# Patient Record
Sex: Female | Born: 1940
Health system: Southern US, Community
[De-identification: ages and names within clinical notes are randomized; demographics above are authoritative.]

## PROBLEM LIST (undated history)

## (undated) DIAGNOSIS — K219 Gastro-esophageal reflux disease without esophagitis: Secondary | ICD-10-CM

## (undated) DIAGNOSIS — E559 Vitamin D deficiency, unspecified: Secondary | ICD-10-CM

## (undated) DIAGNOSIS — Z9889 Other specified postprocedural states: Secondary | ICD-10-CM

## (undated) DIAGNOSIS — E785 Hyperlipidemia, unspecified: Secondary | ICD-10-CM

## (undated) DIAGNOSIS — M1990 Unspecified osteoarthritis, unspecified site: Secondary | ICD-10-CM

## (undated) DIAGNOSIS — R319 Hematuria, unspecified: Secondary | ICD-10-CM

## (undated) DIAGNOSIS — Z8719 Personal history of other diseases of the digestive system: Secondary | ICD-10-CM

## (undated) DIAGNOSIS — C9590 Leukemia, unspecified not having achieved remission: Secondary | ICD-10-CM

## (undated) DIAGNOSIS — R112 Nausea with vomiting, unspecified: Secondary | ICD-10-CM

## (undated) HISTORY — PX: BREAST SURGERY: SHX581

## (undated) HISTORY — PX: ABDOMINAL HYSTERECTOMY: SHX81

## (undated) HISTORY — PX: HIP SURGERY: SHX245

## (undated) HISTORY — PX: OTHER SURGICAL HISTORY: SHX169

## (undated) HISTORY — PX: YAG LASER APPLICATION: SHX6189

---

## 1998-03-15 ENCOUNTER — Other Ambulatory Visit: Admission: RE | Admit: 1998-03-15 | Discharge: 1998-03-15 | Payer: Self-pay | Admitting: Rheumatology

## 1999-05-11 ENCOUNTER — Encounter: Payer: Self-pay | Admitting: Endocrinology

## 1999-05-11 ENCOUNTER — Ambulatory Visit (HOSPITAL_COMMUNITY): Admission: RE | Admit: 1999-05-11 | Discharge: 1999-05-11 | Payer: Self-pay | Admitting: Endocrinology

## 1999-08-26 ENCOUNTER — Encounter: Payer: Self-pay | Admitting: Endocrinology

## 1999-08-26 ENCOUNTER — Ambulatory Visit (HOSPITAL_COMMUNITY): Admission: RE | Admit: 1999-08-26 | Discharge: 1999-08-26 | Payer: Self-pay | Admitting: Endocrinology

## 2001-06-21 ENCOUNTER — Ambulatory Visit (HOSPITAL_COMMUNITY): Admission: RE | Admit: 2001-06-21 | Discharge: 2001-06-21 | Payer: Self-pay | Admitting: Internal Medicine

## 2001-06-21 ENCOUNTER — Encounter: Payer: Self-pay | Admitting: Internal Medicine

## 2001-06-25 ENCOUNTER — Other Ambulatory Visit: Admission: RE | Admit: 2001-06-25 | Discharge: 2001-06-25 | Payer: Self-pay | Admitting: Rheumatology

## 2004-06-20 ENCOUNTER — Encounter: Admission: RE | Admit: 2004-06-20 | Discharge: 2004-06-20 | Payer: Self-pay | Admitting: Orthopedic Surgery

## 2004-10-01 ENCOUNTER — Encounter: Admission: RE | Admit: 2004-10-01 | Discharge: 2004-10-01 | Payer: Self-pay | Admitting: Orthopedic Surgery

## 2004-10-06 ENCOUNTER — Encounter: Admission: RE | Admit: 2004-10-06 | Discharge: 2004-10-06 | Payer: Self-pay | Admitting: Orthopedic Surgery

## 2004-11-21 ENCOUNTER — Ambulatory Visit (HOSPITAL_COMMUNITY): Admission: RE | Admit: 2004-11-21 | Discharge: 2004-11-21 | Payer: Self-pay | Admitting: Family Medicine

## 2004-12-01 ENCOUNTER — Encounter: Admission: RE | Admit: 2004-12-01 | Discharge: 2004-12-01 | Payer: Self-pay | Admitting: Rheumatology

## 2004-12-05 ENCOUNTER — Encounter: Admission: RE | Admit: 2004-12-05 | Discharge: 2004-12-05 | Payer: Self-pay | Admitting: Rheumatology

## 2004-12-22 ENCOUNTER — Ambulatory Visit: Payer: Self-pay | Admitting: Internal Medicine

## 2005-01-18 ENCOUNTER — Ambulatory Visit: Payer: Self-pay | Admitting: Internal Medicine

## 2005-02-27 HISTORY — PX: JOINT REPLACEMENT: SHX530

## 2005-03-07 ENCOUNTER — Encounter: Admission: RE | Admit: 2005-03-07 | Discharge: 2005-03-07 | Payer: Self-pay | Admitting: Rheumatology

## 2005-03-23 ENCOUNTER — Encounter: Admission: RE | Admit: 2005-03-23 | Discharge: 2005-03-23 | Payer: Self-pay | Admitting: Gastroenterology

## 2005-09-05 ENCOUNTER — Encounter: Admission: RE | Admit: 2005-09-05 | Discharge: 2005-09-05 | Payer: Self-pay | Admitting: Rheumatology

## 2005-09-08 ENCOUNTER — Ambulatory Visit (HOSPITAL_COMMUNITY): Admission: RE | Admit: 2005-09-08 | Discharge: 2005-09-08 | Payer: Self-pay | Admitting: Rheumatology

## 2005-09-25 ENCOUNTER — Inpatient Hospital Stay (HOSPITAL_COMMUNITY): Admission: RE | Admit: 2005-09-25 | Discharge: 2005-09-27 | Payer: Self-pay | Admitting: Orthopedic Surgery

## 2007-07-17 ENCOUNTER — Encounter: Admission: RE | Admit: 2007-07-17 | Discharge: 2007-07-17 | Payer: Self-pay | Admitting: Internal Medicine

## 2007-10-21 ENCOUNTER — Ambulatory Visit (HOSPITAL_COMMUNITY): Admission: RE | Admit: 2007-10-21 | Discharge: 2007-10-21 | Payer: Self-pay | Admitting: Orthopedic Surgery

## 2007-10-24 ENCOUNTER — Encounter: Admission: RE | Admit: 2007-10-24 | Discharge: 2007-10-24 | Payer: Self-pay | Admitting: Orthopedic Surgery

## 2008-07-23 ENCOUNTER — Ambulatory Visit (HOSPITAL_COMMUNITY): Admission: RE | Admit: 2008-07-23 | Discharge: 2008-07-23 | Payer: Self-pay | Admitting: Internal Medicine

## 2010-03-20 ENCOUNTER — Encounter: Payer: Self-pay | Admitting: Gastroenterology

## 2010-03-21 ENCOUNTER — Encounter: Payer: Self-pay | Admitting: Orthopedic Surgery

## 2010-07-15 NOTE — H&P (Signed)
[name], [name]              ACCOUNT NO.:  [id number]   MEDICAL RECORD NO.:  [id number]           PATIENT TYPE:   LOCATION:                                 FACILITY:   PHYSICIAN:  Mila Homer. Sherlean Foot, M.D. DATE OF BIRTH:  [birth year]   DATE OF ADMISSION:  09/25/2005  DATE OF DISCHARGE:                                HISTORY & PHYSICAL   CHIEF COMPLAINT:  Right hip pain.   HISTORY OF PRESENT ILLNESS:  [name] is a pleasant 70 year old white  female with right hip pain for one year.  The pain has progressively  worsened since March.  The pain radiates down the leg to her right knee.  Intra-articular hip injections have given her short periods of relief but no  lasting relief.  Mechanical symptoms positive for giving way.  Patient uses  a cane to ambulate.  No waking pain.  X-rays of the right hip show severe  osteoarthritis.   ALLERGIES:  1.  CODEINE causes nausea.  2.  SULFA.  3.  BEXTRA allergy.  4.  PRILOSEC causes fish oil changes.  5.  NARCOTICS in general cause severe fatigue.  Patient wants Darvocet for      pain postop.   MEDICATIONS:  1.  Centrum Silver 1 daily.  2.  Ultimate Mylanta daily.  3.  Tums and Maalox p.r.n.  4.  Tylenol p.r.n.  5.  Advil p.r.n.  Stopped Advil 2 weeks ago.   PAST MEDICAL HISTORY:  1.  GERD.  2.  Rheumatoid arthritis.  3.  Degenerative disk disease, cervical spine.  4.  History of peptic ulcer disease.  5.  Tobacco abuse.  6.  History of asthma.   PAST SURGICAL HISTORY:  1.  Splenectomy in 1974.  2.  Hysterectomy in 1976.  3.  Biopsy of breast and lymph nodes in 1980s, 1990s.  4.  In 1990, 2000, numerous surgeries on the hands and feet due to      rheumatoid changes.   Patient notes nausea postop after all procedures.  She has had no blood  transfusions.   SOCIAL HISTORY:  The patient smokes one pack of cigarettes daily and has  done so for 42 years.  She denies any alcohol use.  She lives in a one story  home with no  steps to the usual entrance.  In fact, she has a ramp.   FAMILY HISTORY:  Mother deceased, age 53.  Heart disease, hypertension.  Father deceased, age 16, due to hypertension and stroke.  She has one  brother who is living, has hypertension.  Another brother, living at age 28,  has diabetes.  Has a total of eight sisters, one deceased at unknown age,  had rheumatoid arthritis, heart disease.  Of her other living sisters, she  has one age 46 with coronary artery disease, another who is in her 28s, has  hypertension, diabetes.   REVIEW OF SYSTEMS:  Patient wears dentures on the top jawline.  She has a  remote history of bronchitis.  History of peptic ulcer disease in the 70s.  She has some diarrhea  chronically due to meds.  Hemorrhoids.  Remote history  of blood in urine, frequent urination, and nocturia.  None currently.  The  patient does have GERD, rheumatoid arthritis, remote history of asthma.  Otherwise, review of systems is negative or noncontributory.  Patient denies  any heart disease, diabetes mellitus.   PHYSICAL EXAMINATION:  VITAL SIGNS:  Patient's height 5 foot 1.  Weight 115  pounds.  GENERAL:  A well-developed and well-nourished, thin white female who walks  with the aid of a crutch.  The patient's mood and affect are appropriate.  She talks easily with the examiner.  HEENT:  Head is normocephalic and atraumatic without frontal maxillary sinus  tenderness to palpation.  Conjunctivae are pink.  PERRLA.  EOMs are intact.  No visible external ear deformities noted.  TM's are pearly and gray  bilaterally.  Nose:  Nasal septum midline.  Nasal mucosa is pink and moist  without polyps.  Buccal mucosa pink and moist.  Patient has dentures on the  top jawline.  Good dental repair in the lower jawline.  Pharynx without  erythema or exudate.  Tongue and uvula midline.  NECK:  She has full range of motion of the cervical spine without pain.  Carotids are 2+ and without bruits.  No  lymphadenopathy is noted.  LUNGS:  Clear to auscultation bilaterally.  No wheezes, rhonchi, or rales.  CARDIAC:  Regular rate and rhythm.  No murmurs, rubs or gallops noted.  ABDOMEN:  Soft.  She does have epigastric tenderness.  No hepatomegaly, no  splenomegaly.  Bowel sounds x4 quadrants.  BREASTS/GENITOURINARY/RECTAL:  All deferred at this time.  BACK:  Patient has no tenderness to palpation over the thoracic lumbar  spine.  NEUROLOGIC:  Patient is alert and oriented x3.  Cranial nerves II-XII are  grossly intact.  EXTREMITIES:  Lower extremity strength testing reveals 5/5 strength  throughout the lower extremities.  Reflexes are 2+ at the knees and ankles,  bilaterally equal and symmetric.  MUSCULOSKELETAL:  Upper extremities:  Patient has full range of motion of  the upper extremities.  Upper extremities are equal in size and shape.  Radial pulses are 2+ bilaterally.  Patient has severe degenerative changes  of bilateral hands.  Lower extremities:  Patient's left hip is 35 degrees of  internal rotation, 40 degrees of external rotation without pain.  Flexion 90  degrees, painful.  Right hip:  Internal rotation is 25 degrees.  External  rotation 30 degrees.  Pain with range of motion.  Flexion to 90 degrees is  nonpainful.  Right knee:  No effusion.  No edema.  She has full range of  motion of the right knee.  Passive range of motion reveals crepitus.  She  has no tenderness with palpation along the mediolateral joint line.  The  left knee, full range of motion without pain.  No effusion.  No edema.  Valgus and varus stressing in both legs reveals no laxity.  The lower  extremities are nonedematous.  Dorsalis pedis pulses are 2+ bilaterally.  She has good sensation to light touch throughout the toes to light touch.  Patient has multiple RA nodules in both hands and lower legs.   IMPRESSION:  1.  Severe osteoarthritis, right hip.  2.  Gastroesophageal reflux disease. 3.  Rheumatoid  arthritis.  4.  Degenerative changes of the cervical spine.  5.  History of peptic ulcer disease.  6.  Tobacco abuse.  7.  History of asthma.   PLAN:  The patient is to be admitted to [facility] on September 25, 2005  to undergo a right total hip arthroplasty by Dr. Sherlean Foot.  Prior to surgery,  patient did get preoperative clearance from her primary care physician, Dr.  Lennox Pippins, at [facility], [place] [place].  Phone number is [phone].      Sheri Pope, P.A.    ______________________________  Mila Homer. Sherlean Foot, M.D.    GC/MEDQ  D:  09/14/2005  T:  09/14/2005  Job:  [id number]

## 2010-07-15 NOTE — Op Note (Signed)
Sheri Pope, Sheri Pope              ACCOUNT NO.:  [id number]   MEDICAL RECORD NO.:  [id number]          PATIENT TYPE:  INP   LOCATION:  5001                         FACILITY:  [facility]   PHYSICIAN:  Mila Homer. Sherlean Foot, M.D. DATE OF BIRTH:  Jan 29, 1941   DATE OF PROCEDURE:  09/25/2005  DATE OF DISCHARGE:  09/08/2005                                 OPERATIVE REPORT   SURGEON:  Georgena Spurling, M.D.   ASSESSMENT:  Richardean Canal, P.A.   ANESTHESIA:  General.   PREOPERATIVE DIAGNOSIS:  Right hip rheumatoid arthritis.   POSTOPERATIVE DIAGNOSIS:  Right hip rheumatoid arthritis.   PROCEDURE:  Right total hip arthroplasty.   INDICATIONS FOR PROCEDURE:  The patient is a 70 year old white female with  rheumatoid arthritis and failure of conservative measures.  Informed consent  obtained.   DESCRIPTION OF PROCEDURE:  The patient was laid supine.  Underwent general  anesthesia and a Foley catheter placement.  She was placed in the left  lateral decubitus position.  A Southern type of incision was made  approximately 8 cm in length centered over the trochanter and curved  posteriorly.  Cautery was used to obtain hemostasis and go through the  fascia lata.  Internal retractor was used to hold this in place.  I then  incised the anterior one-half of the medius, all of the minimus, and the  vastus lateralis in a single sleeve, tagging it with three stay sutures.  I  then performed an anterior hip capsulectomy with the cautery.  I then used  the neck-cutting guide and marked out my neck cut and made it with a  reciprocating saw and easily removed the head and neck segment.  I then  placed a Homan retractor anteriorly and posteriorly to the acetabulum and  switched sides of the table with my P.A.  I then removed the labrum  circumferentially.  I then sequentially reamed up to 48 mm for a 50-mm fiber  mesh cup with three drill holes.  I placed one single 25-mm screw for extra  purchase since the bone  was soft.  I then placed a standard liner accepting  the 32-mm head and tamped that down.  I then returned to the posterior side  of the table.  I then externally rotated and flexed the knee off into a  sterile pouch off the front side of the table.  I then placed a medial  retractor on the cut surface of the femoral neck.  I then used the canal  finder to find the femoral canal and sequentially reamed up to 11 mm.  I  then broached to 11 and trialed with a size 11 broach and a -3.5 head.  This  afforded excellent stability and range of motion.  I then dislocated the  hip, removed the trial components, and copiously irrigated.  I then placed  down a fully porous coated size 11 stem.  I then tamped a -3.5 onto a clean  Morse taper and located the hip.  Took it through an aggressive range of  motion, and it was very, very stable.  I then  irrigated again.  I closed the  abductor lateralis sleeve through drill holes and then over sewed with  figure-of-eight #2 Tevdek sutures.  I then closed the fascia lata with a  running #1 Vicryl suture.  I closed the deep soft tissue with interrupted  buried 0 Vicryl suture.  I then closed the subcuticular layer with a running  subcuticular 2-0 Vicryl stitch.  I then used skin staples and dressed with  Xeroform dressing, sponges, ABD's, and Ioban drape.   COMPLICATIONS:  None.   DRAINS:  None.   ESTIMATED BLOOD LOSS:  300 mL.           ______________________________  Mila Homer. Sherlean Foot, M.D.     SDL/MEDQ  D:  09/25/2005  T:  09/25/2005  Job:  [id number]

## 2010-09-26 ENCOUNTER — Other Ambulatory Visit (HOSPITAL_COMMUNITY): Payer: Self-pay | Admitting: Orthopedic Surgery

## 2010-09-26 ENCOUNTER — Other Ambulatory Visit: Payer: Self-pay | Admitting: Family Medicine

## 2010-09-26 DIAGNOSIS — M25562 Pain in left knee: Secondary | ICD-10-CM

## 2010-09-27 ENCOUNTER — Ambulatory Visit (HOSPITAL_COMMUNITY)
Admission: RE | Admit: 2010-09-27 | Discharge: 2010-09-27 | Disposition: A | Discharge status: Home / Self Care | Payer: Medicare Other | Source: Ambulatory Visit | Attending: Family Medicine | Admitting: Family Medicine

## 2010-09-27 DIAGNOSIS — M25562 Pain in left knee: Secondary | ICD-10-CM

## 2010-09-27 DIAGNOSIS — M23329 Other meniscus derangements, posterior horn of medial meniscus, unspecified knee: Secondary | ICD-10-CM | POA: Insufficient documentation

## 2010-09-27 DIAGNOSIS — M25569 Pain in unspecified knee: Secondary | ICD-10-CM | POA: Insufficient documentation

## 2010-09-28 HISTORY — PX: OTHER SURGICAL HISTORY: SHX169

## 2011-07-07 DIAGNOSIS — M25569 Pain in unspecified knee: Secondary | ICD-10-CM | POA: Diagnosis not present

## 2011-09-08 DIAGNOSIS — E559 Vitamin D deficiency, unspecified: Secondary | ICD-10-CM | POA: Diagnosis not present

## 2011-09-08 DIAGNOSIS — E782 Mixed hyperlipidemia: Secondary | ICD-10-CM | POA: Diagnosis not present

## 2011-09-08 DIAGNOSIS — K219 Gastro-esophageal reflux disease without esophagitis: Secondary | ICD-10-CM | POA: Diagnosis not present

## 2011-09-08 DIAGNOSIS — M069 Rheumatoid arthritis, unspecified: Secondary | ICD-10-CM | POA: Diagnosis not present

## 2011-09-08 DIAGNOSIS — E785 Hyperlipidemia, unspecified: Secondary | ICD-10-CM | POA: Diagnosis not present

## 2011-09-08 DIAGNOSIS — F411 Generalized anxiety disorder: Secondary | ICD-10-CM | POA: Diagnosis not present

## 2011-10-02 DIAGNOSIS — M1710 Unilateral primary osteoarthritis, unspecified knee: Secondary | ICD-10-CM | POA: Diagnosis not present

## 2011-10-02 DIAGNOSIS — M25469 Effusion, unspecified knee: Secondary | ICD-10-CM | POA: Diagnosis not present

## 2011-10-02 DIAGNOSIS — M25569 Pain in unspecified knee: Secondary | ICD-10-CM | POA: Diagnosis not present

## 2011-10-24 ENCOUNTER — Other Ambulatory Visit (HOSPITAL_COMMUNITY): Payer: Self-pay | Admitting: Orthopaedic Surgery

## 2011-10-26 ENCOUNTER — Encounter (HOSPITAL_COMMUNITY): Payer: Self-pay | Admitting: Pharmacy Technician

## 2011-11-03 ENCOUNTER — Encounter (HOSPITAL_COMMUNITY)
Admission: RE | Admit: 2011-11-03 | Discharge: 2011-11-03 | Disposition: A | Discharge status: Home / Self Care | Payer: Medicare Other | Source: Ambulatory Visit | Attending: Orthopaedic Surgery | Admitting: Orthopaedic Surgery

## 2011-11-03 ENCOUNTER — Encounter (HOSPITAL_COMMUNITY): Payer: Self-pay

## 2011-11-03 HISTORY — DX: Hematuria, unspecified: R31.9

## 2011-11-03 HISTORY — DX: Hyperlipidemia, unspecified: E78.5

## 2011-11-03 HISTORY — DX: Personal history of other diseases of the digestive system: Z87.19

## 2011-11-03 HISTORY — DX: Vitamin D deficiency, unspecified: E55.9

## 2011-11-03 HISTORY — DX: Gastro-esophageal reflux disease without esophagitis: K21.9

## 2011-11-03 HISTORY — DX: Unspecified osteoarthritis, unspecified site: M19.90

## 2011-11-03 HISTORY — DX: Other specified postprocedural states: Z98.890

## 2011-11-03 HISTORY — DX: Nausea with vomiting, unspecified: R11.2

## 2011-11-03 LAB — SURGICAL PCR SCREEN
MRSA, PCR: NEGATIVE
Staphylococcus aureus: NEGATIVE

## 2011-11-03 LAB — PROTIME-INR
INR: 0.98 (ref 0.00–1.49)
Prothrombin Time: 13.2 seconds (ref 11.6–15.2)

## 2011-11-03 LAB — BASIC METABOLIC PANEL
CO2: 26 mEq/L (ref 19–32)
Calcium: 9.5 mg/dL (ref 8.4–10.5)
Creatinine, Ser: 0.63 mg/dL (ref 0.50–1.10)
GFR calc Af Amer: >90 mL/min (ref >90)
GFR calc non Af Amer: >90 mL/min (ref >90)
Sodium: 138 mEq/L (ref 135–145)

## 2011-11-03 LAB — CBC
Platelets: 458 10*3/uL — ABNORMAL HIGH (ref 150–400)
RBC: 4 MIL/uL (ref 3.87–5.11)
RDW: 14.5 % (ref 11.5–15.5)
WBC: 9.2 10*3/uL (ref 4.0–10.5)

## 2011-11-03 LAB — URINALYSIS, ROUTINE W REFLEX MICROSCOPIC
Bilirubin Urine: NEGATIVE
Leukocytes, UA: NEGATIVE
Nitrite: NEGATIVE
Specific Gravity, Urine: 1.014 (ref 1.005–1.030)
Urobilinogen, UA: 0.2 mg/dL (ref 0.0–1.0)
pH: 5 (ref 5.0–8.0)

## 2011-11-03 LAB — URINE MICROSCOPIC-ADD ON

## 2011-11-03 LAB — APTT: aPTT: 35 seconds (ref 24–37)

## 2011-11-03 NOTE — Pre-Procedure Instructions (Signed)
PREOP CBC, BMET, PT, PTT, UA WERE DONE TODAY AT [facility] --EKG AND CXR WERE NOT DONE--AS PER ANESTHESIOLOGIST'S GUIDELINES.  T/S WILL BE DONE DAY OF SURGERY. PREOP INSTRUCTIONS DISCUSSED WITH PT USING TEACH BACK METHOD.

## 2011-11-03 NOTE — Patient Instructions (Addendum)
YOUR SURGERY IS SCHEDULED ON:  Friday  9/13  AT 1:44 PM  REPORT TO [facility] AT:  11:30 AM      PHONE # FOR SHORT STAY IS [phone]  DO NOT EAT ANYTHING AFTER MIDNIGHT THE NIGHT BEFORE YOUR SURGERY.  NO FOOD, NO CHEWING GUM, NO MINTS, NO CANDIES, NO CHEWING TOBACCO. YOU MAY HAVE CLEAR LIQUIDS TO DRINK FROM MIDNIGHT THE NIGHT BEFORE YOUR SURGERY - UNTIL 7:30 AM DAY OF SURGERY - LIKE WATER, SUNDROP.   NOTHING TO DRINK AFTER 7:30 AM DAY OF SURGERY.  PLEASE TAKE THE FOLLOWING MEDICATIONS THE AM OF YOUR SURGERY WITH A FEW SIPS OF WATER:  NO MEDS TO TAKE    DO NOT BRING VALUABLES, MONEY, CREDIT CARDS.  CONTACT LENS, DENTURES / PARTIALS, GLASSES SHOULD NOT BE WORN TO SURGERY AND IN MOST CASES-HEARING AIDS WILL NEED TO BE REMOVED.  BRING YOUR GLASSES CASE, ANY EQUIPMENT NEEDED FOR YOUR CONTACT LENS. FOR PATIENTS ADMITTED TO THE HOSPITAL--CHECK OUT TIME THE DAY OF DISCHARGE IS 11:00 AM.  ALL INPATIENT ROOMS ARE PRIVATE - WITH BATHROOM, TELEPHONE, TELEVISION AND WIFI INTERNET. IF YOU ARE BEING DISCHARGED THE SAME DAY OF YOUR SURGERY--YOU CAN NOT DRIVE YOURSELF HOME--AND SHOULD NOT GO HOME ALONE BY TAXI OR BUS.  NO DRIVING OR OPERATING MACHINERY FOR 24 HOURS FOLLOWING ANESTHESIA / PAIN MEDICATIONS.                            SPECIAL INSTRUCTIONS:  CHLORHEXIDINE SOAP SHOWER (other brand names are Betasept and Hibiclens ) PLEASE SHOWER WITH CHLORHEXIDINE THE NIGHT BEFORE YOUR SURGERY AND THE AM OF YOUR SURGERY. DO NOT USE CHLORHEXIDINE ON YOUR FACE OR PRIVATE AREAS--YOU MAY USE YOUR NORMAL SOAP THOSE AREAS AND YOUR NORMAL SHAMPOO.  WOMEN SHOULD AVOID SHAVING UNDER ARMS AND SHAVING LEGS 48 HOURS BEFORE USING CHLORHEXIDINE TO AVOID SKIN IRRITATION.  DO NOT USE IF ALLERGIC TO CHLORHEXIDINE.  PLEASE READ OVER ANY  FACT SHEETS THAT YOU WERE GIVEN: MRSA INFORMATION   PT NOTIFIED HER SURGERY TIME MOVED TO 1:00 PM ON 9/13--SHE NEEDS TO ARRIVE BY 11:OO AM --AND HER CLEAR LIQUIDS ARE FROM  MIDNIGHT UNTIL 7:00 AM--NOTHING AFTER 7:00 AM.

## 2011-11-10 ENCOUNTER — Inpatient Hospital Stay (HOSPITAL_COMMUNITY): Payer: Medicare Other

## 2011-11-10 ENCOUNTER — Encounter (HOSPITAL_COMMUNITY): Payer: Self-pay | Admitting: *Deleted

## 2011-11-10 ENCOUNTER — Inpatient Hospital Stay (HOSPITAL_COMMUNITY): Payer: Medicare Other | Admitting: Anesthesiology

## 2011-11-10 ENCOUNTER — Encounter (HOSPITAL_COMMUNITY)
Admission: RE | Disposition: A | Discharge status: Home Health | Payer: Self-pay | Source: Ambulatory Visit | Attending: Orthopaedic Surgery

## 2011-11-10 ENCOUNTER — Inpatient Hospital Stay (HOSPITAL_COMMUNITY)
Admission: RE | Admit: 2011-11-10 | Discharge: 2011-11-13 | DRG: 470 | Disposition: A | Discharge status: Home Health | Payer: Medicare Other | Source: Ambulatory Visit | Attending: Orthopaedic Surgery | Admitting: Orthopaedic Surgery

## 2011-11-10 ENCOUNTER — Encounter (HOSPITAL_COMMUNITY): Payer: Self-pay | Admitting: Anesthesiology

## 2011-11-10 DIAGNOSIS — M25469 Effusion, unspecified knee: Secondary | ICD-10-CM | POA: Diagnosis not present

## 2011-11-10 DIAGNOSIS — Z96659 Presence of unspecified artificial knee joint: Secondary | ICD-10-CM | POA: Diagnosis not present

## 2011-11-10 DIAGNOSIS — M1712 Unilateral primary osteoarthritis, left knee: Secondary | ICD-10-CM

## 2011-11-10 DIAGNOSIS — M25569 Pain in unspecified knee: Secondary | ICD-10-CM | POA: Diagnosis not present

## 2011-11-10 DIAGNOSIS — Z01812 Encounter for preprocedural laboratory examination: Secondary | ICD-10-CM

## 2011-11-10 DIAGNOSIS — K449 Diaphragmatic hernia without obstruction or gangrene: Secondary | ICD-10-CM | POA: Diagnosis present

## 2011-11-10 DIAGNOSIS — Z471 Aftercare following joint replacement surgery: Secondary | ICD-10-CM | POA: Diagnosis not present

## 2011-11-10 DIAGNOSIS — M1710 Unilateral primary osteoarthritis, unspecified knee: Secondary | ICD-10-CM | POA: Diagnosis not present

## 2011-11-10 DIAGNOSIS — Z96649 Presence of unspecified artificial hip joint: Secondary | ICD-10-CM

## 2011-11-10 DIAGNOSIS — K219 Gastro-esophageal reflux disease without esophagitis: Secondary | ICD-10-CM | POA: Diagnosis present

## 2011-11-10 DIAGNOSIS — D62 Acute posthemorrhagic anemia: Secondary | ICD-10-CM | POA: Diagnosis not present

## 2011-11-10 HISTORY — PX: TOTAL KNEE ARTHROPLASTY: SHX125

## 2011-11-10 LAB — ABO/RH: ABO/RH(D): AB POS

## 2011-11-10 LAB — TYPE AND SCREEN: Antibody Screen: NEGATIVE

## 2011-11-10 SURGERY — ARTHROPLASTY, KNEE, TOTAL
Anesthesia: Spinal | Site: Knee | Laterality: Left | Wound class: Clean

## 2011-11-10 MED ORDER — TRAMADOL HCL 50 MG PO TABS
50.0000 mg | ORAL_TABLET | Freq: Four times a day (QID) | ORAL | Status: DC
  Filled 2011-11-10 (×4): qty 1

## 2011-11-10 MED ORDER — MIDAZOLAM HCL 5 MG/5ML IJ SOLN
INTRAMUSCULAR | Status: DC | PRN
  Administered 2011-11-10 (×2): 1 mg via INTRAVENOUS

## 2011-11-10 MED ORDER — HYDROMORPHONE HCL PF 1 MG/ML IJ SOLN
INTRAMUSCULAR | Status: DC | PRN
  Administered 2011-11-10: 0.5 mg via INTRAVENOUS
  Administered 2011-11-10: 1 mg via INTRAVENOUS
  Administered 2011-11-10: 0.5 mg via INTRAVENOUS

## 2011-11-10 MED ORDER — HYDROMORPHONE HCL PF 1 MG/ML IJ SOLN
INTRAMUSCULAR | Status: AC
  Filled 2011-11-10: qty 1

## 2011-11-10 MED ORDER — ALPRAZOLAM 0.25 MG PO TABS: 0.2500 mg | ORAL_TABLET | Freq: Every evening | ORAL | Status: DC | PRN

## 2011-11-10 MED ORDER — PROMETHAZINE HCL 25 MG/ML IJ SOLN
6.2500 mg | INTRAMUSCULAR | Status: DC | PRN
Start: 2011-11-10 — End: 2011-11-10

## 2011-11-10 MED ORDER — CEFAZOLIN SODIUM 1-5 GM-% IV SOLN
1.0000 g | Freq: Four times a day (QID) | INTRAVENOUS | Status: AC
  Administered 2011-11-10 – 2011-11-11 (×2): 1 g via INTRAVENOUS
  Filled 2011-11-10 (×2): qty 50

## 2011-11-10 MED ORDER — PHENYLEPHRINE HCL 10 MG/ML IJ SOLN
INTRAMUSCULAR | Status: DC | PRN
  Administered 2011-11-10: 40 ug via INTRAVENOUS

## 2011-11-10 MED ORDER — ONDANSETRON HCL 4 MG PO TABS: 4.0000 mg | ORAL_TABLET | Freq: Four times a day (QID) | ORAL | Status: DC | PRN

## 2011-11-10 MED ORDER — ONDANSETRON HCL 4 MG/2ML IJ SOLN
INTRAMUSCULAR | Status: DC | PRN
  Administered 2011-11-10: 4 mg via INTRAVENOUS

## 2011-11-10 MED ORDER — MORPHINE SULFATE (PF) 1 MG/ML IV SOLN
INTRAVENOUS | Status: DC
  Administered 2011-11-10: 16:00:00 via INTRAVENOUS
  Administered 2011-11-11: 1 mg via INTRAVENOUS

## 2011-11-10 MED ORDER — ALUM & MAG HYDROXIDE-SIMETH 200-200-20 MG/5ML PO SUSP: 30.0000 mL | ORAL | Status: DC | PRN

## 2011-11-10 MED ORDER — ONDANSETRON HCL 4 MG/2ML IJ SOLN
4.0000 mg | Freq: Four times a day (QID) | INTRAMUSCULAR | Status: DC | PRN
  Administered 2011-11-11: 4 mg via INTRAVENOUS
  Filled 2011-11-10: qty 2

## 2011-11-10 MED ORDER — OXYCODONE HCL 5 MG/5ML PO SOLN
5.0000 mg | Freq: Once | ORAL | Status: DC | PRN
  Filled 2011-11-10: qty 5

## 2011-11-10 MED ORDER — OXYCODONE HCL 5 MG PO TABS: 5.0000 mg | ORAL_TABLET | Freq: Once | ORAL | Status: DC | PRN

## 2011-11-10 MED ORDER — DIPHENHYDRAMINE HCL 50 MG/ML IJ SOLN: 12.5000 mg | Freq: Four times a day (QID) | INTRAMUSCULAR | Status: DC | PRN

## 2011-11-10 MED ORDER — DIPHENHYDRAMINE HCL 12.5 MG/5ML PO ELIX: 12.5000 mg | ORAL_SOLUTION | ORAL | Status: DC | PRN

## 2011-11-10 MED ORDER — PHENOL 1.4 % MT LIQD: 1.0000 | OROMUCOSAL | Status: DC | PRN

## 2011-11-10 MED ORDER — METHOCARBAMOL 500 MG PO TABS: 500.0000 mg | ORAL_TABLET | Freq: Four times a day (QID) | ORAL | Status: DC | PRN

## 2011-11-10 MED ORDER — METOCLOPRAMIDE HCL 10 MG PO TABS: 5.0000 mg | ORAL_TABLET | Freq: Three times a day (TID) | ORAL | Status: DC | PRN

## 2011-11-10 MED ORDER — ZOLPIDEM TARTRATE 5 MG PO TABS: 5.0000 mg | ORAL_TABLET | Freq: Every evening | ORAL | Status: DC | PRN

## 2011-11-10 MED ORDER — MORPHINE SULFATE 2 MG/ML IJ SOLN: 1.0000 mg | INTRAMUSCULAR | Status: DC | PRN

## 2011-11-10 MED ORDER — METOCLOPRAMIDE HCL 5 MG/ML IJ SOLN: 5.0000 mg | Freq: Three times a day (TID) | INTRAMUSCULAR | Status: DC | PRN

## 2011-11-10 MED ORDER — ASPIRIN EC 325 MG PO TBEC
325.0000 mg | DELAYED_RELEASE_TABLET | Freq: Every day | ORAL | Status: DC
  Administered 2011-11-11 – 2011-11-13 (×3): 325 mg via ORAL
  Filled 2011-11-10 (×4): qty 1

## 2011-11-10 MED ORDER — KETOROLAC TROMETHAMINE 15 MG/ML IJ SOLN
7.5000 mg | Freq: Four times a day (QID) | INTRAMUSCULAR | Status: AC
  Administered 2011-11-10 – 2011-11-11 (×4): 7.5 mg via INTRAVENOUS
  Filled 2011-11-10 (×5): qty 1

## 2011-11-10 MED ORDER — ACETAMINOPHEN 325 MG PO TABS
650.0000 mg | ORAL_TABLET | Freq: Four times a day (QID) | ORAL | Status: DC | PRN
  Administered 2011-11-11: 325 mg via ORAL
  Filled 2011-11-10: qty 2

## 2011-11-10 MED ORDER — ACETAMINOPHEN 10 MG/ML IV SOLN
INTRAVENOUS | Status: DC | PRN
  Administered 2011-11-10: 1000 mg via INTRAVENOUS

## 2011-11-10 MED ORDER — SCOPOLAMINE 1 MG/3DAYS TD PT72
MEDICATED_PATCH | TRANSDERMAL | Status: DC | PRN
  Administered 2011-11-10: 1 via TRANSDERMAL

## 2011-11-10 MED ORDER — LACTATED RINGERS IV SOLN
INTRAVENOUS | Status: DC | PRN
  Administered 2011-11-10 (×2): via INTRAVENOUS

## 2011-11-10 MED ORDER — ACETAMINOPHEN 10 MG/ML IV SOLN: 1000.0000 mg | Freq: Once | INTRAVENOUS | Status: DC | PRN

## 2011-11-10 MED ORDER — HYDROMORPHONE HCL PF 1 MG/ML IJ SOLN
0.2500 mg | INTRAMUSCULAR | Status: DC | PRN
  Administered 2011-11-10 (×3): 0.5 mg via INTRAVENOUS

## 2011-11-10 MED ORDER — DOCUSATE SODIUM 100 MG PO CAPS
100.0000 mg | ORAL_CAPSULE | Freq: Two times a day (BID) | ORAL | Status: DC
  Administered 2011-11-11 – 2011-11-13 (×4): 100 mg via ORAL
  Filled 2011-11-10 (×5): qty 1

## 2011-11-10 MED ORDER — CEFAZOLIN SODIUM-DEXTROSE 2-3 GM-% IV SOLR
2.0000 g | INTRAVENOUS | Status: AC
  Administered 2011-11-10: 2 g via INTRAVENOUS

## 2011-11-10 MED ORDER — FENTANYL CITRATE 0.05 MG/ML IJ SOLN
INTRAMUSCULAR | Status: DC | PRN
  Administered 2011-11-10 (×5): 50 ug via INTRAVENOUS

## 2011-11-10 MED ORDER — MENTHOL 3 MG MT LOZG
1.0000 | LOZENGE | OROMUCOSAL | Status: DC | PRN
  Administered 2011-11-12: 3 mg via ORAL
  Filled 2011-11-10 (×2): qty 9

## 2011-11-10 MED ORDER — SODIUM CHLORIDE 0.9 % IR SOLN
Status: DC | PRN
  Administered 2011-11-10: 1000 mL

## 2011-11-10 MED ORDER — LIDOCAINE HCL 1 % IJ SOLN
INTRAMUSCULAR | Status: DC | PRN
  Administered 2011-11-10: 40 mg via INTRADERMAL

## 2011-11-10 MED ORDER — OXYCODONE HCL 5 MG PO TABS: 5.0000 mg | ORAL_TABLET | ORAL | Status: DC | PRN

## 2011-11-10 MED ORDER — SODIUM CHLORIDE 0.9 % IV SOLN
INTRAVENOUS | Status: DC
  Administered 2011-11-11: 03:00:00 via INTRAVENOUS

## 2011-11-10 MED ORDER — MEPERIDINE HCL 50 MG/ML IJ SOLN: 6.2500 mg | INTRAMUSCULAR | Status: DC | PRN

## 2011-11-10 MED ORDER — SODIUM CHLORIDE 0.9 % IJ SOLN: 9.0000 mL | INTRAMUSCULAR | Status: DC | PRN

## 2011-11-10 MED ORDER — DIPHENHYDRAMINE HCL 12.5 MG/5ML PO ELIX: 12.5000 mg | ORAL_SOLUTION | Freq: Four times a day (QID) | ORAL | Status: DC | PRN

## 2011-11-10 MED ORDER — METOCLOPRAMIDE HCL 5 MG/ML IJ SOLN
INTRAMUSCULAR | Status: DC | PRN
  Administered 2011-11-10: 10 mg via INTRAVENOUS

## 2011-11-10 MED ORDER — PROPOFOL 10 MG/ML IV BOLUS
INTRAVENOUS | Status: DC | PRN
  Administered 2011-11-10: 160 mg via INTRAVENOUS

## 2011-11-10 MED ORDER — METHOCARBAMOL 100 MG/ML IJ SOLN
500.0000 mg | Freq: Four times a day (QID) | INTRAMUSCULAR | Status: DC | PRN
  Administered 2011-11-10: 500 mg via INTRAVENOUS
  Filled 2011-11-10: qty 5

## 2011-11-10 MED ORDER — PROPOFOL INFUSION 10 MG/ML OPTIME
INTRAVENOUS | Status: DC | PRN
  Administered 2011-11-10: 25 ug/kg/min via INTRAVENOUS

## 2011-11-10 MED ORDER — NALOXONE HCL 0.4 MG/ML IJ SOLN: 0.4000 mg | INTRAMUSCULAR | Status: DC | PRN

## 2011-11-10 MED ORDER — SODIUM CHLORIDE 0.9 % IR SOLN
Status: DC | PRN
  Administered 2011-11-10: 3000 mL

## 2011-11-10 MED ORDER — ONDANSETRON HCL 4 MG/2ML IJ SOLN
4.0000 mg | Freq: Four times a day (QID) | INTRAMUSCULAR | Status: DC | PRN
  Administered 2011-11-10: 4 mg via INTRAVENOUS
  Filled 2011-11-10: qty 2

## 2011-11-10 MED ORDER — ACETAMINOPHEN 650 MG RE SUPP: 650.0000 mg | Freq: Four times a day (QID) | RECTAL | Status: DC | PRN

## 2011-11-10 SURGICAL SUPPLY — 58 items
BAG SPEC THK2 15X12 ZIP CLS (MISCELLANEOUS) ×1
BAG ZIPLOCK 12X15 (MISCELLANEOUS) ×2 IMPLANT
BANDAGE ELASTIC 6 VELCRO ST LF (GAUZE/BANDAGES/DRESSINGS) ×2 IMPLANT
BANDAGE ESMARK 6X9 LF (GAUZE/BANDAGES/DRESSINGS) ×1 IMPLANT
BEARING THIATHLON TIBIAL SZ 1 (Knees) ×1 IMPLANT
BLADE SAG 18X100X1.27 (BLADE) ×2 IMPLANT
BLADE SAW SGTL 13.0X1.19X90.0M (BLADE) ×2 IMPLANT
BNDG CMPR 9X6 STRL LF SNTH (GAUZE/BANDAGES/DRESSINGS) ×1
BNDG ESMARK 6X9 LF (GAUZE/BANDAGES/DRESSINGS) ×2
BOWL SMART MIX CTS (DISPOSABLE) ×2 IMPLANT
CEMENT HV SMART SET (Cement) ×4 IMPLANT
CLOTH BEACON ORANGE TIMEOUT ST (SAFETY) ×2 IMPLANT
CUFF TOURN SGL QUICK 34 (TOURNIQUET CUFF) ×2
CUFF TRNQT CYL 34X4X40X1 (TOURNIQUET CUFF) ×1 IMPLANT
DRAPE EXTREMITY T 121X128X90 (DRAPE) ×2 IMPLANT
DRAPE LG THREE QUARTER DISP (DRAPES) ×1 IMPLANT
DRAPE POUCH INSTRU U-SHP 10X18 (DRAPES) ×2 IMPLANT
DRAPE U-SHAPE 47X51 STRL (DRAPES) ×2 IMPLANT
DRSG PAD ABDOMINAL 8X10 ST (GAUZE/BANDAGES/DRESSINGS) ×2 IMPLANT
DURAPREP 26ML APPLICATOR (WOUND CARE) ×2 IMPLANT
ELECT REM PT RETURN 9FT ADLT (ELECTROSURGICAL) ×2
ELECTRODE REM PT RTRN 9FT ADLT (ELECTROSURGICAL) ×1 IMPLANT
EVACUATOR 1/8 PVC DRAIN (DRAIN) ×2 IMPLANT
FACESHIELD LNG OPTICON STERILE (SAFETY) ×10 IMPLANT
GAUZE XEROFORM 5X9 LF (GAUZE/BANDAGES/DRESSINGS) ×2 IMPLANT
GLOVE BIOGEL PI IND STRL 7.5 (GLOVE) ×1 IMPLANT
GLOVE BIOGEL PI IND STRL 8 (GLOVE) ×1 IMPLANT
GLOVE BIOGEL PI INDICATOR 7.5 (GLOVE) ×1
GLOVE BIOGEL PI INDICATOR 8 (GLOVE) ×1
GLOVE SURG SS PI 7.0 STRL IVOR (GLOVE) ×1 IMPLANT
GLOVE SURG SS PI 7.5 STRL IVOR (GLOVE) ×1 IMPLANT
GOWN STRL REIN XL XLG (GOWN DISPOSABLE) ×4 IMPLANT
HANDPIECE INTERPULSE COAX TIP (DISPOSABLE) ×2
IMMOBILIZER KNEE 20 (SOFTGOODS) ×2
IMMOBILIZER KNEE 20 THIGH 36 (SOFTGOODS) IMPLANT
KIT BASIN OR (CUSTOM PROCEDURE TRAY) ×2 IMPLANT
NS IRRIG 1000ML POUR BTL (IV SOLUTION) ×2 IMPLANT
PACK TOTAL JOINT (CUSTOM PROCEDURE TRAY) ×2 IMPLANT
PADDING CAST COTTON 6X4 STRL (CAST SUPPLIES) ×3 IMPLANT
POSITIONER SURGICAL ARM (MISCELLANEOUS) ×2 IMPLANT
SET HNDPC FAN SPRY TIP SCT (DISPOSABLE) ×1 IMPLANT
SET PAD KNEE POSITIONER (MISCELLANEOUS) ×2 IMPLANT
SPONGE GAUZE 4X4 12PLY (GAUZE/BANDAGES/DRESSINGS) ×2 IMPLANT
SPONGE LAP 18X18 X RAY DECT (DISPOSABLE) ×1 IMPLANT
STAPLER VISISTAT 35W (STAPLE) ×2 IMPLANT
SUCTION FRAZIER 12FR DISP (SUCTIONS) ×2 IMPLANT
SUT VIC AB 0 CT1 27 (SUTURE)
SUT VIC AB 0 CT1 27XBRD ANTBC (SUTURE) ×2 IMPLANT
SUT VIC AB 1 CT1 27 (SUTURE) ×2
SUT VIC AB 1 CT1 27XBRD ANTBC (SUTURE) ×3 IMPLANT
SUT VIC AB 2-0 CT1 27 (SUTURE) ×4
SUT VIC AB 2-0 CT1 TAPERPNT 27 (SUTURE) ×2 IMPLANT
SUT VLOC 180 0 24IN GS25 (SUTURE) ×2 IMPLANT
TOWEL OR 17X26 10 PK STRL BLUE (TOWEL DISPOSABLE) ×4 IMPLANT
TOWEL OR NON WOVEN STRL DISP B (DISPOSABLE) ×2 IMPLANT
TRAY FOLEY BAG SILVER LF 16FR (CATHETERS) ×1 IMPLANT
WATER STERILE IRR 1500ML POUR (IV SOLUTION) ×3 IMPLANT
WRAP KNEE MAXI GEL POST OP (GAUZE/BANDAGES/DRESSINGS) ×2 IMPLANT

## 2011-11-10 NOTE — H&P (Signed)
Sheri Pope is an 71 y.o. female.   Chief Complaint: severe left knee pain due to OA HPI:   71 yo female with x-rays showing bone-on-bone wear left knee.  She has daily pain and improved mobility.  She wishes to proceed with a left total knee replacement given the failure of conservative treatment.  The risks are nerve and vessel injury, blood loss, infection and DVT.  The goals are improved mobility and decreased pain.  Past Medical History  Diagnosis Date  . Arthritis     RA AND OA --PAIN AND SWELLIN IN LEFT KNEE  . Vitamin d deficiency   . Hyperlipidemia   . GERD (gastroesophageal reflux disease)   . Blood in urine     NEGATIVE UROLOGY WORK UP --BUT ALWAYS HAS BLOOD IN URINE  . H/O hiatal hernia   . PONV (postoperative nausea and vomiting)     ALWAYS SICK AFTER SURGERIES EXCEPT AFTER HIP REPLACMENT 2007    Past Surgical History  Procedure Date  . Spleenectomy IN THE 70'S    FOR LARGE CYST  . Abdominal hysterectomy 1870'S  . Breast surgery     BREAST BIOSPIES  . Multiple orthopedic surgeries     ON BOTH HANDS AND BOTH FEET, RIGHT ELBOW  . Joint replacement 2007    RIGHT HIP REPLACEMENT  . Left knee arthroscopy AUG 2012    No family history on file. Social History:  reports that she has been smoking Cigarettes.  She has a 50 pack-year smoking history. She does not have any smokeless tobacco history on file. She reports that she does not drink alcohol or use illicit drugs.  Allergies:  Allergies  Allergen Reactions  . Codeine Nausea And Vomiting  . Prednisone Other (See Comments)    Thought she was having a heart attack  . Sulfa Antibiotics Other (See Comments)    unknown  . Latex Rash    No prescriptions prior to admission    No results found for this or any previous visit (from the past 48 hour(s)). No results found.  Review of Systems  All other systems reviewed and are negative.    There were no vitals taken for this visit. Physical Exam    Constitutional: She is oriented to person, place, and time. She appears well-developed and well-nourished.  HENT:  Head: Normocephalic and atraumatic.  Eyes: EOM are normal. Pupils are equal, round, and reactive to light.  Neck: Normal range of motion. Neck supple.  Cardiovascular: Normal rate and regular rhythm.   Respiratory: Effort normal and breath sounds normal.  GI: Soft. Bowel sounds are normal.  Musculoskeletal:       Left knee: She exhibits decreased range of motion and effusion. tenderness found. Medial joint line and lateral joint line tenderness noted.  Neurological: She is alert and oriented to person, place, and time.  Skin: Skin is warm and dry.  Psychiatric: She has a normal mood and affect.     Assessment/Plan Left knee severe painful arthritis 1) to the OR for a left total knee replacement  Trevontae Lindahl Y 11/10/2011, 9:55 AM

## 2011-11-10 NOTE — Anesthesia Preprocedure Evaluation (Addendum)
Anesthesia Evaluation  Patient identified by MRN, date of birth, ID band Patient awake    Reviewed: Allergy & Precautions, H&P , NPO status , Patient's Chart, lab work & pertinent test results  History of Anesthesia Complications (+) PONV  Airway Mallampati: I TM Distance: >3 FB Neck ROM: Full    Dental  (+) Dental Advisory Given, Edentulous Upper, Partial Lower and Caps   Pulmonary neg pulmonary ROS,  breath sounds clear to auscultation  Pulmonary exam normal       Cardiovascular Rhythm:Regular Rate:Normal     Neuro/Psych negative neurological ROS  negative psych ROS   GI/Hepatic Neg liver ROS, hiatal hernia, GERD-  ,  Endo/Other  negative endocrine ROS  Renal/GU negative Renal ROS     Musculoskeletal negative musculoskeletal ROS (+)   Abdominal   Peds  Hematology negative hematology ROS (+)   Anesthesia Other Findings   Reproductive/Obstetrics                         Anesthesia Physical Anesthesia Plan  ASA: II  Anesthesia Plan: General   Post-op Pain Management:    Induction:   Airway Management Planned: LMA  Additional Equipment:   Intra-op Plan:   Post-operative Plan: Extubation in OR  Informed Consent: I have reviewed the patients History and Physical, chart, labs and discussed the procedure including the risks, benefits and alternatives for the proposed anesthesia with the patient or authorized representative who has indicated his/her understanding and acceptance.   Dental advisory given  Plan Discussed with: CRNA and Surgeon  Anesthesia Plan Comments:        Anesthesia Quick Evaluation

## 2011-11-10 NOTE — Anesthesia Procedure Notes (Signed)
Procedure Name: LMA Insertion Performed by: Durward Parcel A Pre-anesthesia Checklist: Patient identified, Timeout performed, Emergency Drugs available, Suction available and Patient being monitored Patient Re-evaluated:Patient Re-evaluated prior to inductionOxygen Delivery Method: Circle system utilized Preoxygenation: Pre-oxygenation with 100% oxygen Intubation Type: IV induction Ventilation: Mask ventilation without difficulty LMA: LMA inserted LMA Size: 4.0 Grade View: Grade I Number of attempts: 1

## 2011-11-10 NOTE — Anesthesia Postprocedure Evaluation (Signed)
Anesthesia Post Note  Patient: Sheri Pope  Procedure(s) Performed: Procedure(s) (LRB): TOTAL KNEE ARTHROPLASTY (Left)  Anesthesia type: General  Patient location: PACU  Post pain: Pain level controlled  Post assessment: Post-op Vital signs reviewed  Last Vitals: BP 129/50  Pulse 97  Temp 36.4 C  Resp 15  SpO2 96%  Post vital signs: Reviewed  Level of consciousness: sedated  Complications: No apparent anesthesia complications

## 2011-11-10 NOTE — Transfer of Care (Signed)
Immediate Anesthesia Transfer of Care Note  Patient: Sheri Pope  Procedure(s) Performed: Procedure(s) (LRB) with comments: TOTAL KNEE ARTHROPLASTY (Left) - Left Total Knee Arthroplasty  Patient Location: PACU  Anesthesia Type: General  Level of Consciousness: awake, sedated, patient cooperative and responds to stimulation  Airway & Oxygen Therapy: Patient Spontanous Breathing and Patient connected to face mask oxygen  Post-op Assessment: Report given to PACU RN, Post -op Vital signs reviewed and stable and Patient moving all extremities  Post vital signs: Reviewed and stable  Complications: No apparent anesthesia complications

## 2011-11-10 NOTE — Brief Op Note (Signed)
11/10/2011  3:35 PM  PATIENT:  Sheri Pope  71 y.o. female  PRE-OPERATIVE DIAGNOSIS:  Severe osteoarthritis left knee  POST-OPERATIVE DIAGNOSIS:  Severe osteoarthritis left knee  PROCEDURE:  Procedure(s) (LRB) with comments: TOTAL KNEE ARTHROPLASTY (Left) - Left Total Knee Arthroplasty  SURGEON:  Surgeon(s) and Role:    * Kathryne Hitch, MD - Primary  PHYSICIAN ASSISTANT:   ASSISTANTS: Maud Deed, PA-C   ANESTHESIA:   general  EBL:  Total I/O In: 1000 [I.V.:1000] Out: 100 [Urine:100]  BLOOD ADMINISTERED:none  DRAINS: none   LOCAL MEDICATIONS USED:  MARCAINE     SPECIMEN:  No Specimen  DISPOSITION OF SPECIMEN:  N/A  COUNTS:  YES  TOURNIQUET:   Total Tourniquet Time Documented: Thigh (Left) - 65 minutes  DICTATION: .Other Dictation: Dictation Number [id number]  PLAN OF CARE: Admit to inpatient   PATIENT DISPOSITION:  PACU - hemodynamically stable.   Delay start of Pharmacological VTE agent (>24hrs) due to surgical blood loss or risk of bleeding: no

## 2011-11-10 NOTE — Plan of Care (Signed)
Problem: Consults Goal: Diagnosis- Total Joint Replacement Outcome: Progressing Primary Total Knee     

## 2011-11-11 LAB — BASIC METABOLIC PANEL
BUN: 13 mg/dL (ref 6–23)
Chloride: 101 mEq/L (ref 96–112)
Glucose, Bld: 112 mg/dL — ABNORMAL HIGH (ref 70–99)
Potassium: 4.3 mEq/L (ref 3.5–5.1)

## 2011-11-11 LAB — CBC
HCT: 30.1 % — ABNORMAL LOW (ref 36.0–46.0)
Hemoglobin: 10.2 g/dL — ABNORMAL LOW (ref 12.0–15.0)
MCHC: 33.9 g/dL (ref 30.0–36.0)
WBC: 12.9 10*3/uL — ABNORMAL HIGH (ref 4.0–10.5)

## 2011-11-11 NOTE — Progress Notes (Signed)
Physical Therapy Treatment Patient Details Name: Sheri Pope MRN: [id number] DOB: 13-Jun-1940 Today's Date: 11/11/2011 Time: 4098-1191 PT Time Calculation (min): 23 min  PT Assessment / Plan / Recommendation Comments on Treatment Session  Min A for bed to chair with RW. Nausea/pain limit activity tolerance. Expect good progress once nausea resolves. Pt puts forth good effort. HHPT recommended.    Follow Up Recommendations  Home health PT    Barriers to Discharge        Equipment Recommendations  None recommended by PT    Recommendations for Other Services OT consult  Frequency 7X/week   Plan Discharge plan remains appropriate;Frequency remains appropriate    Precautions / Restrictions Precautions Precautions: Knee Required Braces or Orthoses: Knee Immobilizer - Left Restrictions Weight Bearing Restrictions: No Other Position/Activity Restrictions: WBAT   Pertinent Vitals/Pain *5/10 L knee, pt premedicated Ice applied**    Mobility  Bed Mobility Bed Mobility: Supine to Sit Supine to Sit: HOB elevated;4: Min assist Details for Bed Mobility Assistance: min A for LLE, pt 90%, HOB 60* Transfers Transfers: Sit to Stand;Stand to Sit Sit to Stand: 4: Min assist;With armrests;With upper extremity assist;From bed Stand to Sit: 4: Min assist;To chair/3-in-1;With upper extremity assist;With armrests Details for Transfer Assistance: VCs hand placement and set up, min A for balance Ambulation/Gait Ambulation/Gait Assistance: 4: Min guard Ambulation Distance (Feet): 5 Feet Assistive device: Rolling walker Gait Pattern: Step-to pattern General Gait Details: VCs for sequencing    Exercises Total Joint Exercises Ankle Circles/Pumps: AROM;Both;Supine;10 reps Quad Sets: Both;10 reps;Supine;AROM Heel Slides: AAROM;Left;10 reps;Supine Hip ABduction/ADduction: AAROM;Left;5 reps;Supine   PT Diagnosis: Difficulty walking;Acute pain  PT Problem List: Decreased range of  motion;Decreased activity tolerance;Pain;Decreased mobility PT Treatment Interventions: Gait training;Therapeutic activities;Therapeutic exercise;Patient/family education   PT Goals Acute Rehab PT Goals PT Goal Formulation: With patient Time For Goal Achievement: 11/15/11 Potential to Achieve Goals: Good Pt will go Supine/Side to Sit: with modified independence PT Goal: Supine/Side to Sit - Progress: Progressing toward goal Pt will go Sit to Stand: with modified independence PT Goal: Sit to Stand - Progress: Progressing toward goal Pt will Ambulate: 51 - 150 feet;with rolling walker;with modified independence PT Goal: Ambulate - Progress: Progressing toward goal Pt will Perform Home Exercise Program: with min assist PT Goal: Perform Home Exercise Program - Progress: Progressing toward goal  Visit Information  Last PT Received On: 11/11/11 Assistance Needed: +1    Subjective Data  Subjective: I'm still a bit nauseous.  Patient Stated Goal: to walk   Cognition  Overall Cognitive Status: Appears within functional limits for tasks assessed/performed Arousal/Alertness: Awake/alert Orientation Level: Appears intact for tasks assessed Behavior During Session: [facility] for tasks performed    Balance     End of Session PT - End of Session Activity Tolerance: Treatment limited secondary to medical complications (Comment) (nausea) Patient left: in chair Nurse Communication: Mobility status CPM Left Knee CPM Left Knee: Off   GP     Ralene Bathe Kistler 11/11/2011, 1:46 PM [phone]

## 2011-11-11 NOTE — Plan of Care (Signed)
Problem: Phase II Progression Outcomes Goal: Ambulates Outcome: Not Progressing nausea

## 2011-11-11 NOTE — Plan of Care (Signed)
Problem: Consults Goal: Diagnosis- Total Joint Replacement Outcome: Completed/Met Date Met:  11/11/11 Primary Total Knee LEFT     

## 2011-11-11 NOTE — Op Note (Signed)
Sheri Pope, [name]              ACCOUNT NO.:  [id number]  MEDICAL RECORD NO.:  [id number]  LOCATION:  1607                         FACILITY:  [facility]  PHYSICIAN:  Vanita Panda. Magnus Ivan, M.D.DATE OF BIRTH:  07-Jul-1940  DATE OF PROCEDURE:  11/10/2011 DATE OF DISCHARGE:                              OPERATIVE REPORT   PREOPERATIVE DIAGNOSIS:  Severe end-stage arthritis, left knee.  POSTOPERATIVE DIAGNOSIS:  Severe end-stage arthritis, left knee.  PROCEDURE:  Left total knee arthroplasty.  IMPLANTS:  Stryker triathlon knee with size 1 femur, size 1 tibia, 9 mm polyethylene insert, size 29 patellar button.  SURGEON:  Vanita Panda. Magnus Ivan, MD  ASSISTANT:  Maud Deed PA-C, who was present and assisted in the entire case and whose assistance was crucial given the case completed.  ANESTHESIA:  General.  TOURNIQUET TIME:  64 minutes.  BLOOD LOSS:  Less than 100 mL.  COMPLICATIONS:  None.  INDICATIONS:  [name] is a 71 year old female with debilitating end- stage arthritis involving her left knee with radiographic evidence of bone-on-bone wear.  She has failed conservative treatment and has significant synovitis of her knee with failure of conservative treatment.  She wished to proceed with a total knee arthroplasty.  The risks and benefits of surgery were explained to her in detail, and she does wish to proceed with surgery.  PROCEDURE DESCRIPTION:  After informed consent was obtained, appropriate left knee was marked.  She was brought to the operating, placed on the operative table.  General anesthesia was then obtained.  A Foley catheter was placed as well as a tourniquet on her upper left thigh. Her left leg was then prepped and draped with DuraPrep and sterile drapes including sterile stockinette.  A time-out was called to identify identified as correct patient and correct left knee.  We then used an Esmarch wrap to the leg and tourniquet was inflated to 300 mm  of pressure.  Midline incision was then made directly over the patella and carried proximally and distally.  We dissected down the knee joint and performed a medial parapatellar arthrotomy.  Once we entered the arthrotomy, we subluxed the patella and found significant synovitis throughout the knee.  There was a large effusion in the knee as well and abundant osteophytes throughout.  We cleaned the knee of osteophytes and debris as well as remnants of the medial and lateral meniscus.  I released tissue medially and laterally.  Next, attention was turned to the tibia.  Using extramedullary guide, we adjusted for negative slope of the tibia and set our resection level at about 9 mm.  Using a cutting guide, we then resected at least 9 mm off the tibia, representing over the high side.  Attention was turned to the femur.  We made our distal femoral cut at about 9-10 mm.  We then used an extension block to balance her knee in extension, and she showed full extension with a 9 mm insert.  We then set our rotation guide and select our size for a size 1 femur based off lateral epicondylar axis as well as setting the rotation and external rotation.  We used a size 1 cutting block and made our anterior and  posterior cuts followed by our chamfer cuts.  We then made our femoral box cut and drilled lugs for our femur.  Attention was turned then back to the tibia.  We trialed a size 1 tibia and made the keel punch for this.  With all trial components in place, then we turned to the patella and we cut 6 mm off the patella and drilled for a 29 patellar button. With all implants in place, she actually felt like she had excellent range of motion and it was stable.  We removed all trial components and we copiously irrigated the knee with normal saline solution.  We then cemented the real Stryker triathlon size 1 tibia followed by the real size 1 femur.  We cleaned the cement debris and then we placed the real  9 mm polyethylene fix bearing insert, posterior stabilized.  We cemented the 29 mm patellar button.  Once the cement had dried, we let the tourniquet down and hemostasis was obtained with electrocautery.  We placed a medium Hemovac drain in the arthrotomy, and copiously irrigated the knee with normal saline solution using pulsatile lavage.  We then closed the arthrotomy with interrupted #1 Vicryl suture followed by 0 V-Loc suture.  The subcutaneous tissue with 2-0 Vicryl and staples on the skin.  Xeroform followed by well-padded sterile dressing was applied.  She was awakened, extubated, and taken to recovery room in stable condition.  All final counts were correct.  There were no complications noted.     Vanita Panda. Magnus Ivan, M.D.     CYB/MEDQ  D:  11/10/2011  T:  11/11/2011  Job:  [id number]

## 2011-11-11 NOTE — Progress Notes (Signed)
[name], RN stated pt requested scopolomine patch be removed and RN removed it.

## 2011-11-11 NOTE — Evaluation (Signed)
Physical Therapy Evaluation Patient Details Name: Sheri Pope MRN: [id number] DOB: 17-Nov-1940 Today's Date: 11/11/2011 Time: 7829-5621 PT Time Calculation (min): 13 min  PT Assessment / Plan / Recommendation Clinical Impression  71 y.o. female POD #1 for L TKA.  Pt unable to tolerate getting OOB at present due to nausea. RN notified. Expect good progress once nausea resolves. HHPT recommended. Pt would benefit from acute PT to maximize safety and independence with mobility.     PT Assessment  Patient needs continued PT services    Follow Up Recommendations  Home health PT    Barriers to Discharge        Equipment Recommendations  None recommended by PT    Recommendations for Other Services OT consult   Frequency 7X/week    Precautions / Restrictions Precautions Precautions: Knee Required Braces or Orthoses: Knee Immobilizer - Left Restrictions Weight Bearing Restrictions: No Other Position/Activity Restrictions: WBAT   Pertinent Vitals/Pain *8/10 L knee RN notified**      Mobility  Bed Mobility Bed Mobility: Not assessed Details for Bed Mobility Assistance: NT 2* nausea Transfers Transfers: Not assessed Ambulation/Gait Ambulation/Gait Assistance: Not tested (comment)    Exercises Total Joint Exercises Ankle Circles/Pumps: AROM;Both;5 reps;Supine Heel Slides: AAROM;Left;10 reps;Supine Hip ABduction/ADduction: AAROM;Left;5 reps;Supine   PT Diagnosis: Difficulty walking;Acute pain  PT Problem List: Decreased range of motion;Decreased activity tolerance;Pain;Decreased mobility PT Treatment Interventions: Gait training;Therapeutic activities;Therapeutic exercise;Patient/family education   PT Goals Acute Rehab PT Goals PT Goal Formulation: With patient Time For Goal Achievement: 11/15/11 Potential to Achieve Goals: Good Pt will go Supine/Side to Sit: with modified independence PT Goal: Supine/Side to Sit - Progress: Goal set today Pt will go Sit to Stand:  with modified independence PT Goal: Sit to Stand - Progress: Goal set today Pt will Ambulate: 51 - 150 feet;with rolling walker;with modified independence PT Goal: Ambulate - Progress: Goal set today Pt will Perform Home Exercise Program: with min assist PT Goal: Perform Home Exercise Program - Progress: Goal set today  Visit Information  Last PT Received On: 11/11/11    Subjective Data  Subjective: I feel crazy in the head and nauseated. I'm not a wimp, I want to get up.     Prior Functioning  Home Living Lives With: Alone Available Help at Discharge: Family (nephew to stay with pt) Home Access: Ramped entrance Home Layout: One level Home Adaptive Equipment: Bedside commode/3-in-1;Shower chair without back;Walker - rolling Prior Function Level of Independence: Independent Able to Take Stairs?: Yes Driving: Yes Communication Communication: No difficulties    Cognition  Overall Cognitive Status: Appears within functional limits for tasks assessed/performed Arousal/Alertness: Awake/alert Orientation Level: Appears intact for tasks assessed Behavior During Session: [facility] for tasks performed    Extremity/Trunk Assessment Right Upper Extremity Assessment RUE ROM/Strength/Tone: [facility] for tasks assessed Left Upper Extremity Assessment LUE ROM/Strength/Tone: WFL for tasks assessed Right Lower Extremity Assessment RLE ROM/Strength/Tone: WFL for tasks assessed RLE Sensation: WFL - Light Touch RLE Coordination: WFL - gross/fine motor Left Lower Extremity Assessment LLE ROM/Strength/Tone: Deficits;Due to pain LLE ROM/Strength/Tone Deficits: knee flexion AAROM 30*, limited by pain LLE Sensation: WFL - Light Touch LLE Coordination: WFL - gross/fine motor Trunk Assessment Trunk Assessment: Normal   Balance    End of Session PT - End of Session Activity Tolerance: Treatment limited secondary to medical complications (Comment) (nausea) Patient left: in bed Nurse Communication: Patient  requests pain meds CPM Left Knee CPM Left Knee: Off  GP     Ralene Bathe Kistler 11/11/2011,  11:49 AM  [phone]

## 2011-11-11 NOTE — Progress Notes (Signed)
Subjective: Pt stable - wants pain med less strong than tramadol   Objective: Vital signs in last 24 hours: Temp:  [97.5 F (36.4 C)-98.6 F (37 C)] 98.6 F (37 C) (09/14 7829) Pulse Rate:  [57-97] 73  (09/14 0638) Resp:  [10-20] 14  (09/14 0638) BP: (93-150)/(44-80) 93/54 mmHg (09/14 0638) SpO2:  [94 %-100 %] 100 % (09/14 0638) FiO2 (%):  [94 %] 94 % (09/13 1700) Weight:  [50.349 kg (111 lb)] 50.349 kg (111 lb) (09/13 1700)  Intake/Output from previous day: 09/13 0701 - 09/14 0700 In: 2439.2 [P.O.:60; I.V.:2279.2; IV Piggyback:100] Out: 1315 [Urine:625; Drains:640; Blood:50] Intake/Output this shift:    Exam:  Neurovascular intact Sensation intact distally Intact pulses distally Dorsiflexion/Plantar flexion intact  Labs:  Basename 11/11/11 0423  HGB 10.2*    Basename 11/11/11 0423  WBC 12.9*  RBC 2.99*  HCT 30.1*  PLT 347    Basename 11/11/11 0423  NA 133*  K 4.3  CL 101  CO2 27  BUN 13  CREATININE 0.71  GLUCOSE 112*  CALCIUM 8.3*   No results found for this basename: LABPT:2,INR:2 in the last 72 hours  Assessment/Plan: Pt stable start PT today - tylenol for pain   DEAN,GREGORY SCOTT 11/11/2011, 8:11 AM

## 2011-11-12 LAB — CBC
HCT: 26.9 % — ABNORMAL LOW (ref 36.0–46.0)
Hemoglobin: 9.4 g/dL — ABNORMAL LOW (ref 12.0–15.0)
MCH: 34.8 pg — ABNORMAL HIGH (ref 26.0–34.0)
MCHC: 34.9 g/dL (ref 30.0–36.0)

## 2011-11-12 NOTE — Progress Notes (Signed)
Sheri Pope rep Sheri Pope notified CM of [facility] inability to provide services for Sheri Pope in her area of residence. CM informed Sheri Pope of new information. Sheri Pope offered choice for [facility]. Per Sheri Pope choice [facility] to provide [facility] services upon discharge. Sheri Pope states does not want to go to SNF. [facility] notified of new referral. Demographics, H/P, faxed to [facility] at [phone]. Confirmation received.   [name] [phone]

## 2011-11-12 NOTE — Progress Notes (Signed)
Physical Therapy Treatment Patient Details Name: Sheri Pope MRN: [id number] DOB: 03/08/1940 Today's Date: 11/12/2011 Time: 0922-0957 PT Time Calculation (min): 35 min  PT Assessment / Plan / Recommendation Comments on Treatment Session  Pt progressing well today, nausea has resolved. She ambulated 125' with RW and supervision. Expect will be ready to DC tomorrow. Min assist for ther ex.     Follow Up Recommendations  Home health PT    Barriers to Discharge        Equipment Recommendations  None recommended by PT    Recommendations for Other Services OT consult  Frequency 7X/week   Plan Discharge plan remains appropriate;Frequency remains appropriate    Precautions / Restrictions Precautions Precautions: Knee Required Braces or Orthoses: Knee Immobilizer - Left Restrictions Weight Bearing Restrictions: No Other Position/Activity Restrictions: WBAT   Pertinent Vitals/Pain *6/10 L knee with walking Pt refused pain meds, ice applied**    Mobility  Bed Mobility Bed Mobility: Supine to Sit Supine to Sit: HOB elevated;4: Min assist Details for Bed Mobility Assistance: min A for LLE, pt 90%, HOB 30* Transfers Transfers: Sit to Stand;Stand to Sit Sit to Stand: With armrests;With upper extremity assist;From bed;4: Min guard Stand to Sit: With upper extremity assist;With armrests;4: Min guard;To bed Details for Transfer Assistance: VCs hand placement and set up Ambulation/Gait Ambulation/Gait Assistance: 5: Supervision Ambulation Distance (Feet): 125 Feet Assistive device: Rolling walker Ambulation/Gait Assistance Details: with R KI Gait Pattern: Step-through pattern General Gait Details: VCs for sequencing    Exercises Total Joint Exercises Ankle Circles/Pumps: AROM;Both;Supine;10 reps Quad Sets: Both;10 reps;Supine;AROM Towel Squeeze: AROM;Both;10 reps;Supine Short Arc Quad: AAROM;Left;10 reps;Supine Heel Slides: AAROM;Left;10 reps;Supine Straight Leg Raises:  AAROM;Left;10 reps;Supine   PT Diagnosis:    PT Problem List:   PT Treatment Interventions:     PT Goals Acute Rehab PT Goals PT Goal Formulation: With patient Time For Goal Achievement: 11/15/11 Potential to Achieve Goals: Good Pt will go Supine/Side to Sit: with modified independence PT Goal: Supine/Side to Sit - Progress: Goal set today Pt will go Sit to Stand: with modified independence PT Goal: Sit to Stand - Progress: Progressing toward goal Pt will Ambulate: 51 - 150 feet;with rolling walker;with modified independence PT Goal: Ambulate - Progress: Progressing toward goal Pt will Perform Home Exercise Program: with min assist PT Goal: Perform Home Exercise Program - Progress: Progressing toward goal  Visit Information  Last PT Received On: 11/12/11 Assistance Needed: +1    Subjective Data  Subjective: I feel better today.   Cognition  Overall Cognitive Status: Appears within functional limits for tasks assessed/performed Arousal/Alertness: Awake/alert Orientation Level: Appears intact for tasks assessed Behavior During Session: [facility] for tasks performed    Balance     End of Session PT - End of Session Equipment Utilized During Treatment: Gait belt;Left knee immobilizer Activity Tolerance: Patient tolerated treatment well (nausea) Patient left: in bed Nurse Communication: Mobility status CPM Left Knee CPM Left Knee: Off   GP     Ralene Bathe Kistler 11/12/2011, 10:02 AM [phone]
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
## 2011-11-12 NOTE — Progress Notes (Signed)
OT Note:  Pt screened for OT.  She has had hip surgery in past, has comfort height commode and tub transfer bench, which she has used.  Pt will have intermittent assistance at home.  She states that she has walked into bathroom with someone in room but not next to her, and she feels she will be fine doing this at home.  [place], OTR/L [phone] 11/12/2011

## 2011-11-12 NOTE — Progress Notes (Signed)
Cm spoke with patient concerning dc planning. Per pt choice [facility] to provide [facility] services upon discharge. Pt states having access to DME due to previous surgery. Pt to discharge home with nephew to assist in home care. Genevieve Norlander rep Lupita Leash notified of new referral. Demographics, H/P, progress noted [facility]ed [facility] at [phone]. Confirmation received. No other needs specified at this time.   [name] [phone]

## 2011-11-12 NOTE — Progress Notes (Signed)
Subjective: Pt requests dc home - on tylenol for pain   Objective: Vital signs in last 24 hours: Temp:  [97.8 F (36.6 C)-99.5 F (37.5 C)] 99.5 F (37.5 C) (09/15 0538) Pulse Rate:  [65-110] 102  (09/15 0542) Resp:  [12-16] 12  (09/15 0542) BP: (103-122)/(63) 122/63 mmHg (09/15 0538) SpO2:  [95 %-100 %] 97 % (09/15 0538)  Intake/Output from previous day: 09/14 0701 - 09/15 0700 In: 600 [P.O.:600] Out: 1125 [Urine:1125] Intake/Output this shift: Total I/O In: 240 [P.O.:240] Out: 250 [Urine:250]  Exam:  Sensation intact distally Intact pulses distally Dorsiflexion/Plantar flexion intact  Labs:  Basename 11/12/11 0416 11/11/11 0423  HGB 9.4* 10.2*    Basename 11/12/11 0416 11/11/11 0423  WBC 11.2* 12.9*  RBC 2.70* 2.99*  HCT 26.9* 30.1*  PLT 304 347    Basename 11/11/11 0423  NA 133*  K 4.3  CL 101  CO2 27  BUN 13  CREATININE 0.71  GLUCOSE 112*  CALCIUM 8.3*   No results found for this basename: LABPT:2,INR:2 in the last 72 hours  Assessment/Plan: Pt doing well - ambulating in room - needs to do stairs - probable dc am   Wandy Bossler SCOTT 11/12/2011, 8:42 AM

## 2011-11-13 LAB — CBC
MCH: 34.4 pg — ABNORMAL HIGH (ref 26.0–34.0)
MCV: 99.2 fL (ref 78.0–100.0)
Platelets: 286 10*3/uL (ref 150–400)
RDW: 14.7 % (ref 11.5–15.5)

## 2011-11-13 MED ORDER — ASPIRIN 325 MG PO TBEC: 325.0000 mg | DELAYED_RELEASE_TABLET | Freq: Every day | ORAL | Status: AC

## 2011-11-13 MED ORDER — METHOCARBAMOL 500 MG PO TABS: 500.0000 mg | ORAL_TABLET | Freq: Four times a day (QID) | ORAL | Status: AC | PRN

## 2011-11-13 MED ORDER — OXYCODONE-ACETAMINOPHEN 5-325 MG PO TABS: 1.0000 | ORAL_TABLET | ORAL | Status: AC | PRN

## 2011-11-13 NOTE — Progress Notes (Signed)
Utilization review completed.  

## 2011-11-13 NOTE — Progress Notes (Signed)
Subjective: 3 Days Post-Op Procedure(s) (LRB): TOTAL KNEE ARTHROPLASTY (Left) Patient reports pain as mild.  Asymptomatic acute blood loss anemia.  Objective: Vital signs in last 24 hours: Temp:  [98 F (36.7 C)-99.5 F (37.5 C)] 99.3 F (37.4 C) (09/16 0626) Pulse Rate:  [103-113] 103  (09/16 0626) Resp:  [13-16] 16  (09/16 0626) BP: (121-130)/(47-84) 130/73 mmHg (09/16 0626) SpO2:  [93 %-98 %] 93 % (09/16 0626)  Intake/Output from previous day: 09/15 0701 - 09/16 0700 In: 720 [P.O.:720] Out: 250 [Urine:250] Intake/Output this shift: Total I/O In: 240 [P.O.:240] Out: -    Basename 11/13/11 0437 11/12/11 0416 11/11/11 0423  HGB 8.9* 9.4* 10.2*    Basename 11/13/11 0437 11/12/11 0416  WBC 11.9* 11.2*  RBC 2.59* 2.70*  HCT 25.7* 26.9*  PLT 286 304    Basename 11/11/11 0423  NA 133*  K 4.3  CL 101  CO2 27  BUN 13  CREATININE 0.71  GLUCOSE 112*  CALCIUM 8.3*   No results found for this basename: LABPT:2,INR:2 in the last 72 hours  Sensation intact distally Intact pulses distally Dorsiflexion/Plantar flexion intact Incision: scant drainage No cellulitis present Compartment soft  Assessment/Plan: 3 Days Post-Op Procedure(s) (LRB): TOTAL KNEE ARTHROPLASTY (Left) Discharge home with home health  Kathryne Hitch 11/13/2011, 6:36 AM

## 2011-11-13 NOTE — Progress Notes (Signed)
Pt for discharge home today with home health PT per [facility], IV D/C with dressing CDI to L knee.  Dressing supplies provided for home use, D/C instructions and Rx given with verbalized understanding.  Family at bedside to assist pt with discharge. Staff brought pt downstairs via wheelchair. Sheri Pope, Sheri Pope 10:56 AM

## 2011-11-13 NOTE — Progress Notes (Signed)
Physical Therapy Treatment Patient Details Name: Sheri Pope MRN: [id number] DOB: 1940/04/14 Today's Date: 11/13/2011 Time: 7829-5621 PT Time Calculation (min): 37 min  PT Assessment / Plan / Recommendation Comments on Treatment Session  reviewed don/doff KI and car transfer.    Follow Up Recommendations  Home health PT    Barriers to Discharge        Equipment Recommendations  None recommended by PT    Recommendations for Other Services OT consult  Frequency 7X/week   Plan Discharge plan remains appropriate;Frequency remains appropriate    Precautions / Restrictions Precautions Precautions: Knee Required Braces or Orthoses: Knee Immobilizer - Left Restrictions Weight Bearing Restrictions: No Other Position/Activity Restrictions: WBAT   Pertinent Vitals/Pain Min c/o pain at rest; 7/10 with there ex    Mobility  Bed Mobility Bed Mobility: Supine to Sit;Sit to Supine Supine to Sit: 6: Modified independent (Device/Increase time);HOB flat;With rails Sit to Supine: 6: Modified independent (Device/Increase time);With rail Transfers Transfers: Sit to Stand;Stand to Sit Sit to Stand: 5: Supervision Stand to Sit: 5: Supervision Ambulation/Gait Ambulation/Gait Assistance: 5: Supervision Ambulation Distance (Feet): 180 Feet Assistive device: Rolling walker Ambulation/Gait Assistance Details: min cues for position from RW Gait Pattern: Step-through pattern;Step-to pattern    Exercises Total Joint Exercises Ankle Circles/Pumps: AROM;Both;Supine;15 reps Quad Sets: Both;Supine;AROM;15 reps Heel Slides: AAROM;Left;Supine;15 reps Straight Leg Raises: AAROM;Left;Supine;15 reps   PT Diagnosis:    PT Problem List:   PT Treatment Interventions:     PT Goals Acute Rehab PT Goals PT Goal Formulation: With patient Time For Goal Achievement: 11/15/11 Potential to Achieve Goals: Good Pt will go Supine/Side to Sit: with modified independence PT Goal: Supine/Side to Sit -  Progress: Met Pt will go Sit to Stand: with modified independence PT Goal: Sit to Stand - Progress: Met Pt will Ambulate: 51 - 150 feet;with rolling walker;with modified independence PT Goal: Ambulate - Progress: Progressing toward goal Pt will Perform Home Exercise Program: with min assist PT Goal: Perform Home Exercise Program - Progress: Met  Visit Information  Last PT Received On: 11/13/11 Assistance Needed: +1    Subjective Data  Subjective: I can't handle pain meds so I have had nothign since Saturday Patient Stated Goal: go home   Cognition  Overall Cognitive Status: Appears within functional limits for tasks assessed/performed Arousal/Alertness: Awake/alert Orientation Level: Appears intact for tasks assessed Behavior During Session: [facility] for tasks performed    Balance     End of Session PT - End of Session Equipment Utilized During Treatment: Left knee immobilizer Activity Tolerance: Patient tolerated treatment well Patient left: in bed;with call bell/phone within reach Nurse Communication: Mobility status CPM Left Knee CPM Left Knee: Off   GP     Addaline Peplinski 11/13/2011, 10:18 AM

## 2011-11-13 NOTE — Discharge Summary (Signed)
Patient ID: Sheri Pope MRN: [id number] DOB/AGE: [birth year] 71 y.o.  Admit date: 11/10/2011 Discharge date: 11/13/2011  Admission Diagnoses:  Principal Problem:  *Degenerative arthritis of left knee   Discharge Diagnoses:  Same  Past Medical History  Diagnosis Date  . Arthritis     RA AND OA --PAIN AND SWELLIN IN LEFT KNEE  . Vitamin d deficiency   . Hyperlipidemia   . GERD (gastroesophageal reflux disease)   . Blood in urine     NEGATIVE UROLOGY WORK UP --BUT ALWAYS HAS BLOOD IN URINE  . H/O hiatal hernia   . PONV (postoperative nausea and vomiting)     ALWAYS SICK AFTER SURGERIES EXCEPT AFTER HIP REPLACMENT 2007    Surgeries: Procedure(s): TOTAL KNEE ARTHROPLASTY on 11/10/2011   Consultants:    Discharged Condition: Improved  Hospital Course: Sheri Pope is an 71 y.o. female who was admitted 11/10/2011 for operative treatment ofDegenerative arthritis of left knee. Patient has severe unremitting pain that affects sleep, daily activities, and work/hobbies. After pre-op clearance the patient was taken to the operating room on 11/10/2011 and underwent  Procedure(s): TOTAL KNEE ARTHROPLASTY.    Patient was given perioperative antibiotics: Anti-infectives     Start     Dose/Rate Route Frequency Ordered Stop   11/10/11 1900   ceFAZolin (ANCEF) IVPB 1 g/50 mL premix        1 g 100 mL/hr over 30 Minutes Intravenous Every 6 hours 11/10/11 1722 11/11/11 0149   11/10/11 1059   ceFAZolin (ANCEF) IVPB 2 g/50 mL premix        2 g 100 mL/hr over 30 Minutes Intravenous 60 min pre-op 11/10/11 1059 11/10/11 1334           Patient was given sequential compression devices, early ambulation, and chemoprophylaxis to prevent DVT.  Patient benefited maximally from hospital stay and there were no complications.    Recent vital signs: Patient Vitals for the past 24 hrs:  BP Temp Temp src Pulse Resp SpO2  11/13/11 0626 130/73 mmHg 99.3 F (37.4 C) Oral 103  16  93 %  2011-11-21  2048 124/47 mmHg 99.5 F (37.5 C) Oral 107  16  95 %  11/21/2011 1600 - - - - 16  98 %  11-21-11 1415 121/84 mmHg 98 F (36.7 C) Oral 113  15  97 %  21-Nov-2011 1200 - - - - 14  98 %  11/21/2011 0800 - - - - 13  98 %     Recent laboratory studies:  Basename 11/13/11 0437 2011-11-21 0416 11/11/11 0423  WBC 11.9* 11.2* --  HGB 8.9* 9.4* --  HCT 25.7* 26.9* --  PLT 286 304 --  NA -- -- 133*  K -- -- 4.3  CL -- -- 101  CO2 -- -- 27  BUN -- -- 13  CREATININE -- -- 0.71  GLUCOSE -- -- 112*  INR -- -- --  CALCIUM -- -- 8.3*     Discharge Medications:     Medication List     As of 11/13/2011  6:35 AM    STOP taking these medications         naproxen sodium 220 MG tablet   Commonly known as: ANAPROX      TAKE these medications         acetaminophen 500 MG tablet   Commonly known as: TYLENOL   Take 500 mg by mouth every 6 (six) hours as needed. Pain      ALPRAZolam  0.25 MG tablet   Commonly known as: XANAX   Take 0.25 mg by mouth at bedtime as needed. sleep      alum & mag hydroxide-simeth 200-200-20 MG/5ML suspension   Commonly known as: MAALOX/MYLANTA   Take 5 mLs by mouth every 6 (six) hours as needed. Acid reflux      aspirin 325 MG EC tablet   Take 1 tablet (325 mg total) by mouth daily with breakfast.      lactose free nutrition Liqd   Take 1 Container by mouth daily.      methocarbamol 500 MG tablet   Commonly known as: ROBAXIN   Take 1 tablet (500 mg total) by mouth every 6 (six) hours as needed.      oxyCODONE-acetaminophen 5-325 MG per tablet   Commonly known as: PERCOCET/ROXICET   Take 1 tablet by mouth every 4 (four) hours as needed for pain.        Diagnostic Studies: X-ray Knee Left Port  11/10/2011  *RADIOLOGY REPORT*  Clinical Data: Status post arthroplasty.  PORTABLE LEFT KNEE - 1-2 VIEW  Comparison: 09/27/2010  Findings: The patient is status post left total knee arthroplasty. Satisfactory position and alignment.  Surgical drain good position.   IMPRESSION: No adverse features.   Original Report Authenticated By: Elsie Stain, M.D.     Disposition: to home      Discharge Orders    Future Orders Please Complete By Expires   Diet - low sodium heart healthy      Call MD / Call 911      Comments:   If you experience chest pain or shortness of breath, CALL 911 and be transported to the hospital emergency room.  If you develope a fever above 101 F, pus (white drainage) or increased drainage or redness at the wound, or calf pain, call your surgeon's office.   Constipation Prevention      Comments:   Drink plenty of fluids.  Prune juice may be helpful.  You may use a stool softener, such as Colace (over the counter) 100 mg twice a day.  Use MiraLax (over the counter) for constipation as needed.   Increase activity slowly as tolerated      Discharge instructions      Comments:   Increase your activities as comfort allows. You can get your current dressing wet in the shower. You can get your actual incision wet starting 11/15/11; then dry dressing daily.   Discharge patient         Follow-up Information    Follow up with Kathryne Hitch, MD. In 2 weeks.   Contact information:   [facility] [address] [place] [address] [phone]           Signed: Kathryne Hitch 11/13/2011, 6:35 AM

## 2011-11-14 ENCOUNTER — Encounter (HOSPITAL_COMMUNITY): Payer: Self-pay | Admitting: Orthopaedic Surgery

## 2011-11-16 DIAGNOSIS — Z96659 Presence of unspecified artificial knee joint: Secondary | ICD-10-CM | POA: Diagnosis not present

## 2011-11-16 DIAGNOSIS — M1710 Unilateral primary osteoarthritis, unspecified knee: Secondary | ICD-10-CM | POA: Diagnosis not present

## 2011-11-16 DIAGNOSIS — Z471 Aftercare following joint replacement surgery: Secondary | ICD-10-CM | POA: Diagnosis not present

## 2011-11-16 DIAGNOSIS — R269 Unspecified abnormalities of gait and mobility: Secondary | ICD-10-CM | POA: Diagnosis not present

## 2011-11-16 DIAGNOSIS — IMO0001 Reserved for inherently not codable concepts without codable children: Secondary | ICD-10-CM | POA: Diagnosis not present

## 2011-11-17 DIAGNOSIS — R269 Unspecified abnormalities of gait and mobility: Secondary | ICD-10-CM | POA: Diagnosis not present

## 2011-11-17 DIAGNOSIS — Z96659 Presence of unspecified artificial knee joint: Secondary | ICD-10-CM | POA: Diagnosis not present

## 2011-11-17 DIAGNOSIS — IMO0001 Reserved for inherently not codable concepts without codable children: Secondary | ICD-10-CM | POA: Diagnosis not present

## 2011-11-17 DIAGNOSIS — M1710 Unilateral primary osteoarthritis, unspecified knee: Secondary | ICD-10-CM | POA: Diagnosis not present

## 2011-11-17 DIAGNOSIS — Z471 Aftercare following joint replacement surgery: Secondary | ICD-10-CM | POA: Diagnosis not present

## 2011-11-20 DIAGNOSIS — IMO0001 Reserved for inherently not codable concepts without codable children: Secondary | ICD-10-CM | POA: Diagnosis not present

## 2011-11-20 DIAGNOSIS — R269 Unspecified abnormalities of gait and mobility: Secondary | ICD-10-CM | POA: Diagnosis not present

## 2011-11-20 DIAGNOSIS — Z96659 Presence of unspecified artificial knee joint: Secondary | ICD-10-CM | POA: Diagnosis not present

## 2011-11-20 DIAGNOSIS — M1710 Unilateral primary osteoarthritis, unspecified knee: Secondary | ICD-10-CM | POA: Diagnosis not present

## 2011-11-20 DIAGNOSIS — Z471 Aftercare following joint replacement surgery: Secondary | ICD-10-CM | POA: Diagnosis not present

## 2011-11-22 DIAGNOSIS — R269 Unspecified abnormalities of gait and mobility: Secondary | ICD-10-CM | POA: Diagnosis not present

## 2011-11-22 DIAGNOSIS — Z96659 Presence of unspecified artificial knee joint: Secondary | ICD-10-CM | POA: Diagnosis not present

## 2011-11-22 DIAGNOSIS — Z471 Aftercare following joint replacement surgery: Secondary | ICD-10-CM | POA: Diagnosis not present

## 2011-11-22 DIAGNOSIS — M1710 Unilateral primary osteoarthritis, unspecified knee: Secondary | ICD-10-CM | POA: Diagnosis not present

## 2011-11-22 DIAGNOSIS — IMO0001 Reserved for inherently not codable concepts without codable children: Secondary | ICD-10-CM | POA: Diagnosis not present

## 2011-11-24 DIAGNOSIS — IMO0001 Reserved for inherently not codable concepts without codable children: Secondary | ICD-10-CM | POA: Diagnosis not present

## 2011-11-24 DIAGNOSIS — M1710 Unilateral primary osteoarthritis, unspecified knee: Secondary | ICD-10-CM | POA: Diagnosis not present

## 2011-11-24 DIAGNOSIS — R269 Unspecified abnormalities of gait and mobility: Secondary | ICD-10-CM | POA: Diagnosis not present

## 2011-11-24 DIAGNOSIS — Z471 Aftercare following joint replacement surgery: Secondary | ICD-10-CM | POA: Diagnosis not present

## 2011-11-24 DIAGNOSIS — Z96659 Presence of unspecified artificial knee joint: Secondary | ICD-10-CM | POA: Diagnosis not present

## 2011-11-27 DIAGNOSIS — M1710 Unilateral primary osteoarthritis, unspecified knee: Secondary | ICD-10-CM | POA: Diagnosis not present

## 2011-11-28 DIAGNOSIS — M1710 Unilateral primary osteoarthritis, unspecified knee: Secondary | ICD-10-CM | POA: Diagnosis not present

## 2011-11-28 DIAGNOSIS — IMO0001 Reserved for inherently not codable concepts without codable children: Secondary | ICD-10-CM | POA: Diagnosis not present

## 2011-11-28 DIAGNOSIS — Z96659 Presence of unspecified artificial knee joint: Secondary | ICD-10-CM | POA: Diagnosis not present

## 2011-11-28 DIAGNOSIS — R269 Unspecified abnormalities of gait and mobility: Secondary | ICD-10-CM | POA: Diagnosis not present

## 2011-11-28 DIAGNOSIS — Z471 Aftercare following joint replacement surgery: Secondary | ICD-10-CM | POA: Diagnosis not present

## 2011-11-29 DIAGNOSIS — R269 Unspecified abnormalities of gait and mobility: Secondary | ICD-10-CM | POA: Diagnosis not present

## 2011-11-29 DIAGNOSIS — Z96659 Presence of unspecified artificial knee joint: Secondary | ICD-10-CM | POA: Diagnosis not present

## 2011-11-29 DIAGNOSIS — Z471 Aftercare following joint replacement surgery: Secondary | ICD-10-CM | POA: Diagnosis not present

## 2011-11-29 DIAGNOSIS — IMO0001 Reserved for inherently not codable concepts without codable children: Secondary | ICD-10-CM | POA: Diagnosis not present

## 2011-11-29 DIAGNOSIS — M1710 Unilateral primary osteoarthritis, unspecified knee: Secondary | ICD-10-CM | POA: Diagnosis not present

## 2011-12-01 DIAGNOSIS — Z96659 Presence of unspecified artificial knee joint: Secondary | ICD-10-CM | POA: Diagnosis not present

## 2011-12-01 DIAGNOSIS — Z471 Aftercare following joint replacement surgery: Secondary | ICD-10-CM | POA: Diagnosis not present

## 2011-12-01 DIAGNOSIS — M1710 Unilateral primary osteoarthritis, unspecified knee: Secondary | ICD-10-CM | POA: Diagnosis not present

## 2011-12-01 DIAGNOSIS — R269 Unspecified abnormalities of gait and mobility: Secondary | ICD-10-CM | POA: Diagnosis not present

## 2011-12-01 DIAGNOSIS — IMO0001 Reserved for inherently not codable concepts without codable children: Secondary | ICD-10-CM | POA: Diagnosis not present

## 2012-01-29 DIAGNOSIS — M1710 Unilateral primary osteoarthritis, unspecified knee: Secondary | ICD-10-CM | POA: Diagnosis not present

## 2012-01-29 DIAGNOSIS — M25469 Effusion, unspecified knee: Secondary | ICD-10-CM | POA: Diagnosis not present

## 2012-01-29 DIAGNOSIS — M25569 Pain in unspecified knee: Secondary | ICD-10-CM | POA: Diagnosis not present

## 2012-02-29 DIAGNOSIS — H2510 Age-related nuclear cataract, unspecified eye: Secondary | ICD-10-CM | POA: Diagnosis not present

## 2012-02-29 DIAGNOSIS — H5210 Myopia, unspecified eye: Secondary | ICD-10-CM | POA: Diagnosis not present

## 2012-02-29 DIAGNOSIS — H52229 Regular astigmatism, unspecified eye: Secondary | ICD-10-CM | POA: Diagnosis not present

## 2012-02-29 DIAGNOSIS — H524 Presbyopia: Secondary | ICD-10-CM | POA: Diagnosis not present

## 2012-05-27 DIAGNOSIS — H04129 Dry eye syndrome of unspecified lacrimal gland: Secondary | ICD-10-CM | POA: Diagnosis not present

## 2012-05-27 DIAGNOSIS — H2510 Age-related nuclear cataract, unspecified eye: Secondary | ICD-10-CM | POA: Diagnosis not present

## 2012-05-28 ENCOUNTER — Encounter (HOSPITAL_COMMUNITY): Payer: Self-pay | Admitting: Pharmacy Technician

## 2012-05-28 ENCOUNTER — Encounter (HOSPITAL_COMMUNITY)
Admission: RE | Admit: 2012-05-28 | Discharge: 2012-05-28 | Disposition: A | Discharge status: Home / Self Care | Payer: Medicare Other | Source: Ambulatory Visit | Attending: Ophthalmology | Admitting: Ophthalmology

## 2012-05-28 ENCOUNTER — Encounter (HOSPITAL_COMMUNITY): Payer: Self-pay

## 2012-05-28 DIAGNOSIS — M069 Rheumatoid arthritis, unspecified: Secondary | ICD-10-CM | POA: Diagnosis not present

## 2012-05-28 DIAGNOSIS — Z888 Allergy status to other drugs, medicaments and biological substances status: Secondary | ICD-10-CM | POA: Diagnosis not present

## 2012-05-28 DIAGNOSIS — K219 Gastro-esophageal reflux disease without esophagitis: Secondary | ICD-10-CM | POA: Diagnosis not present

## 2012-05-28 DIAGNOSIS — H2510 Age-related nuclear cataract, unspecified eye: Secondary | ICD-10-CM | POA: Diagnosis not present

## 2012-05-28 DIAGNOSIS — F17200 Nicotine dependence, unspecified, uncomplicated: Secondary | ICD-10-CM | POA: Diagnosis not present

## 2012-05-28 DIAGNOSIS — Z882 Allergy status to sulfonamides status: Secondary | ICD-10-CM | POA: Diagnosis not present

## 2012-05-28 DIAGNOSIS — Z885 Allergy status to narcotic agent status: Secondary | ICD-10-CM | POA: Diagnosis not present

## 2012-05-28 LAB — BASIC METABOLIC PANEL
CO2: 25 mEq/L (ref 19–32)
Calcium: 9.8 mg/dL (ref 8.4–10.5)
Creatinine, Ser: 0.74 mg/dL (ref 0.50–1.10)
GFR calc Af Amer: >90 mL/min (ref >90)

## 2012-05-28 LAB — HEMOGLOBIN AND HEMATOCRIT, BLOOD: Hemoglobin: 14 g/dL (ref 12.0–15.0)

## 2012-05-28 NOTE — Patient Instructions (Addendum)
Your procedure is scheduled on: 05/30/2012  Report to [facility] at  1200    PM.  Call this number if you have problems the morning of surgery: [phone]   Do not eat food or drink liquids :After Midnight.      Take these medicines the morning of surgery with A SIP OF WATER: xanax   Do not wear jewelry, make-up or nail polish.  Do not wear lotions, powders, or perfumes.   Do not shave 48 hours prior to surgery.  Do not bring valuables to the hospital.  Contacts, dentures or bridgework may not be worn into surgery.  Leave suitcase in the car. After surgery it may be brought to your room.  For patients admitted to the hospital, checkout time is 11:00 AM the day of discharge.   Patients discharged the day of surgery will not be allowed to drive home.  :     Please read over the following fact sheets that you were given: Coughing and Deep Breathing, Surgical Site Infection Prevention, Anesthesia Post-op Instructions and Care and Recovery After Surgery    Cataract A cataract is a clouding of the lens of the eye. When a lens becomes cloudy, vision is reduced based on the degree and nature of the clouding. Many cataracts reduce vision to some degree. Some cataracts make people more near-sighted as they develop. Other cataracts increase glare. Cataracts that are ignored and become worse can sometimes look white. The white color can be seen through the pupil. CAUSES   Aging. However, cataracts may occur at any age, even in newborns.   Certain drugs.   Trauma to the eye.   Certain diseases such as diabetes.   Specific eye diseases such as chronic inflammation inside the eye or a sudden attack of a rare form of glaucoma.   Inherited or acquired medical problems.  SYMPTOMS   Gradual, progressive drop in vision in the affected eye.   Severe, rapid visual loss. This most often happens when trauma is the cause.  DIAGNOSIS  To detect a cataract, an eye doctor examines the lens. Cataracts  are best diagnosed with an exam of the eyes with the pupils enlarged (dilated) by drops.  TREATMENT  For an early cataract, vision may improve by using different eyeglasses or stronger lighting. If that does not help your vision, surgery is the only effective treatment. A cataract needs to be surgically removed when vision loss interferes with your everyday activities, such as driving, reading, or watching TV. A cataract may also have to be removed if it prevents examination or treatment of another eye problem. Surgery removes the cloudy lens and usually replaces it with a substitute lens (intraocular lens, IOL).  At a time when both you and your doctor agree, the cataract will be surgically removed. If you have cataracts in both eyes, only one is usually removed at a time. This allows the operated eye to heal and be out of danger from any possible problems after surgery (such as infection or poor wound healing). In rare cases, a cataract may be doing damage to your eye. In these cases, your caregiver may advise surgical removal right away. The vast majority of people who have cataract surgery have better vision afterward. HOME CARE INSTRUCTIONS  If you are not planning surgery, you may be asked to do the following:  Use different eyeglasses.   Use stronger or brighter lighting.   Ask your eye doctor about reducing your medicine dose or changing  medicines if it is thought that a medicine caused your cataract. Changing medicines does not make the cataract go away on its own.   Become familiar with your surroundings. Poor vision can lead to injury. Avoid bumping into things on the affected side. You are at a higher risk for tripping or falling.   Exercise extreme care when driving or operating machinery.   Wear sunglasses if you are sensitive to bright light or experiencing problems with glare.  SEEK IMMEDIATE MEDICAL CARE IF:   You have a worsening or sudden vision loss.   You notice redness,  swelling, or increasing pain in the eye.   You have a fever.  Document Released: 02/13/2005 Document Revised: 02/02/2011 Document Reviewed: 10/07/2010 [facility] Patient Information 2012 [place].PATIENT INSTRUCTIONS POST-ANESTHESIA  IMMEDIATELY FOLLOWING SURGERY:  Do not drive or operate machinery for the first twenty four hours after surgery.  Do not make any important decisions for twenty four hours after surgery or while taking narcotic pain medications or sedatives.  If you develop intractable nausea and vomiting or a severe headache please notify your doctor immediately.  FOLLOW-UP:  Please make an appointment with your surgeon as instructed. You do not need to follow up with anesthesia unless specifically instructed to do so.  WOUND CARE INSTRUCTIONS (if applicable):  Keep a dry clean dressing on the anesthesia/puncture wound site if there is drainage.  Once the wound has quit draining you may leave it open to air.  Generally you should leave the bandage intact for twenty four hours unless there is drainage.  If the epidural site drains for more than 36-48 hours please call the anesthesia department.  QUESTIONS?:  Please feel free to call your physician or the hospital operator if you have any questions, and they will be happy to assist you.

## 2012-05-29 MED ORDER — LIDOCAINE HCL 3.5 % OP GEL
OPHTHALMIC | Status: AC
  Filled 2012-05-29: qty 5

## 2012-05-29 MED ORDER — TETRACAINE HCL 0.5 % OP SOLN
OPHTHALMIC | Status: AC
  Filled 2012-05-29: qty 2

## 2012-05-29 MED ORDER — CYCLOPENTOLATE-PHENYLEPHRINE 0.2-1 % OP SOLN
OPHTHALMIC | Status: AC
  Filled 2012-05-29: qty 2

## 2012-05-29 MED ORDER — LIDOCAINE HCL (PF) 1 % IJ SOLN
INTRAMUSCULAR | Status: AC
  Filled 2012-05-29: qty 2

## 2012-05-29 MED ORDER — PHENYLEPHRINE HCL 2.5 % OP SOLN
OPHTHALMIC | Status: AC
  Filled 2012-05-29: qty 2

## 2012-05-29 MED ORDER — NEOMYCIN-POLYMYXIN-DEXAMETH 3.5-10000-0.1 OP OINT
TOPICAL_OINTMENT | OPHTHALMIC | Status: AC
  Filled 2012-05-29: qty 3.5

## 2012-05-30 ENCOUNTER — Encounter (HOSPITAL_COMMUNITY): Payer: Self-pay | Admitting: Anesthesiology

## 2012-05-30 ENCOUNTER — Ambulatory Visit (HOSPITAL_COMMUNITY)
Admission: RE | Admit: 2012-05-30 | Discharge: 2012-05-30 | Disposition: A | Discharge status: Home / Self Care | Payer: Medicare Other | Source: Ambulatory Visit | Attending: Ophthalmology | Admitting: Ophthalmology

## 2012-05-30 ENCOUNTER — Encounter (HOSPITAL_COMMUNITY): Payer: Self-pay | Admitting: *Deleted

## 2012-05-30 ENCOUNTER — Encounter (HOSPITAL_COMMUNITY)
Admission: RE | Disposition: A | Discharge status: Home / Self Care | Payer: Self-pay | Source: Ambulatory Visit | Attending: Ophthalmology

## 2012-05-30 ENCOUNTER — Ambulatory Visit (HOSPITAL_COMMUNITY): Payer: Medicare Other | Admitting: Anesthesiology

## 2012-05-30 DIAGNOSIS — F17200 Nicotine dependence, unspecified, uncomplicated: Secondary | ICD-10-CM | POA: Insufficient documentation

## 2012-05-30 DIAGNOSIS — M069 Rheumatoid arthritis, unspecified: Secondary | ICD-10-CM | POA: Insufficient documentation

## 2012-05-30 DIAGNOSIS — H2510 Age-related nuclear cataract, unspecified eye: Secondary | ICD-10-CM | POA: Insufficient documentation

## 2012-05-30 DIAGNOSIS — Z888 Allergy status to other drugs, medicaments and biological substances status: Secondary | ICD-10-CM | POA: Insufficient documentation

## 2012-05-30 DIAGNOSIS — Z885 Allergy status to narcotic agent status: Secondary | ICD-10-CM | POA: Diagnosis not present

## 2012-05-30 DIAGNOSIS — H269 Unspecified cataract: Secondary | ICD-10-CM | POA: Diagnosis not present

## 2012-05-30 DIAGNOSIS — K219 Gastro-esophageal reflux disease without esophagitis: Secondary | ICD-10-CM | POA: Diagnosis not present

## 2012-05-30 DIAGNOSIS — Z882 Allergy status to sulfonamides status: Secondary | ICD-10-CM | POA: Insufficient documentation

## 2012-05-30 HISTORY — PX: CATARACT EXTRACTION W/PHACO: SHX586

## 2012-05-30 SURGERY — PHACOEMULSIFICATION, CATARACT, WITH IOL INSERTION
Anesthesia: Monitor Anesthesia Care | Site: Eye | Laterality: Left | Wound class: Clean

## 2012-05-30 MED ORDER — CYCLOPENTOLATE-PHENYLEPHRINE 0.2-1 % OP SOLN
1.0000 [drp] | OPHTHALMIC | Status: AC
  Administered 2012-05-30 (×3): 1 [drp] via OPHTHALMIC

## 2012-05-30 MED ORDER — TETRACAINE HCL 0.5 % OP SOLN
1.0000 [drp] | OPHTHALMIC | Status: AC
  Administered 2012-05-30 (×3): 1 [drp] via OPHTHALMIC

## 2012-05-30 MED ORDER — LACTATED RINGERS IV SOLN
INTRAVENOUS | Status: DC
  Administered 2012-05-30: 09:00:00 via INTRAVENOUS

## 2012-05-30 MED ORDER — FENTANYL CITRATE 0.05 MG/ML IJ SOLN: 25.0000 ug | INTRAMUSCULAR | Status: DC | PRN

## 2012-05-30 MED ORDER — POVIDONE-IODINE 5 % OP SOLN
OPHTHALMIC | Status: DC | PRN
  Administered 2012-05-30: 1 via OPHTHALMIC

## 2012-05-30 MED ORDER — ONDANSETRON HCL 4 MG/2ML IJ SOLN: 4.0000 mg | Freq: Once | INTRAMUSCULAR | Status: DC | PRN

## 2012-05-30 MED ORDER — BSS IO SOLN
INTRAOCULAR | Status: DC | PRN
  Administered 2012-05-30: 15 mL via INTRAOCULAR

## 2012-05-30 MED ORDER — LIDOCAINE HCL 3.5 % OP GEL
1.0000 "application " | Freq: Once | OPHTHALMIC | Status: AC
  Administered 2012-05-30: 1 via OPHTHALMIC

## 2012-05-30 MED ORDER — LIDOCAINE 3.5 % OP GEL OPTIME - NO CHARGE
OPHTHALMIC | Status: DC | PRN
  Administered 2012-05-30: 2 [drp] via OPHTHALMIC

## 2012-05-30 MED ORDER — NEOMYCIN-POLYMYXIN-DEXAMETH 0.1 % OP OINT
TOPICAL_OINTMENT | OPHTHALMIC | Status: DC | PRN
  Administered 2012-05-30: 1 via OPHTHALMIC

## 2012-05-30 MED ORDER — EPINEPHRINE HCL 1 MG/ML IJ SOLN
INTRAOCULAR | Status: DC | PRN
  Administered 2012-05-30: 09:00:00

## 2012-05-30 MED ORDER — PHENYLEPHRINE HCL 2.5 % OP SOLN
1.0000 [drp] | OPHTHALMIC | Status: AC
  Administered 2012-05-30 (×3): 1 [drp] via OPHTHALMIC

## 2012-05-30 MED ORDER — MIDAZOLAM HCL 2 MG/2ML IJ SOLN
INTRAMUSCULAR | Status: AC
  Filled 2012-05-30: qty 2

## 2012-05-30 MED ORDER — MIDAZOLAM HCL 2 MG/2ML IJ SOLN
1.0000 mg | INTRAMUSCULAR | Status: DC | PRN
  Administered 2012-05-30: 2 mg via INTRAVENOUS

## 2012-05-30 MED ORDER — LIDOCAINE HCL (PF) 1 % IJ SOLN
INTRAMUSCULAR | Status: DC | PRN
  Administered 2012-05-30: .5 mL

## 2012-05-30 MED ORDER — EPINEPHRINE HCL 1 MG/ML IJ SOLN
INTRAMUSCULAR | Status: AC
  Filled 2012-05-30: qty 1

## 2012-05-30 MED ORDER — PROVISC 10 MG/ML IO SOLN
INTRAOCULAR | Status: DC | PRN
  Administered 2012-05-30: 8.5 mg via INTRAOCULAR

## 2012-05-30 SURGICAL SUPPLY — 34 items
CAPSULAR TENSION RING-AMO (OPHTHALMIC RELATED) IMPLANT
CLOTH BEACON ORANGE TIMEOUT ST (SAFETY) ×1 IMPLANT
EYE SHIELD UNIVERSAL CLEAR (GAUZE/BANDAGES/DRESSINGS) ×1 IMPLANT
GLOVE BIO SURGEON STRL SZ 6.5 (GLOVE) IMPLANT
GLOVE BIOGEL PI IND STRL 6.5 (GLOVE) IMPLANT
GLOVE BIOGEL PI IND STRL 7.0 (GLOVE) IMPLANT
GLOVE BIOGEL PI IND STRL 7.5 (GLOVE) IMPLANT
GLOVE BIOGEL PI INDICATOR 6.5 (GLOVE) ×1
GLOVE BIOGEL PI INDICATOR 7.0 (GLOVE)
GLOVE BIOGEL PI INDICATOR 7.5 (GLOVE)
GLOVE ECLIPSE 6.5 STRL STRAW (GLOVE) IMPLANT
GLOVE ECLIPSE 7.0 STRL STRAW (GLOVE) IMPLANT
GLOVE ECLIPSE 7.5 STRL STRAW (GLOVE) IMPLANT
GLOVE EXAM NITRILE LRG STRL (GLOVE) IMPLANT
GLOVE EXAM NITRILE MD LF STRL (GLOVE) ×1 IMPLANT
GLOVE SKINSENSE NS SZ6.5 (GLOVE)
GLOVE SKINSENSE NS SZ7.0 (GLOVE)
GLOVE SKINSENSE NS SZ7.5 (GLOVE) ×1
GLOVE SKINSENSE STRL SZ6.5 (GLOVE) IMPLANT
GLOVE SKINSENSE STRL SZ7.0 (GLOVE) IMPLANT
GLOVE SKINSENSE STRL SZ7.5 (GLOVE) IMPLANT
KIT VITRECTOMY (OPHTHALMIC RELATED) IMPLANT
PAD ARMBOARD 7.5X6 YLW CONV (MISCELLANEOUS) ×1 IMPLANT
PROC W NO LENS (INTRAOCULAR LENS)
PROC W SPEC LENS (INTRAOCULAR LENS)
PROCESS W NO LENS (INTRAOCULAR LENS) IMPLANT
PROCESS W SPEC LENS (INTRAOCULAR LENS) IMPLANT
RING MALYGIN (MISCELLANEOUS) IMPLANT
SIGHTPATH CAT PROC W REG LENS (Ophthalmic Related) ×2 IMPLANT
SYR TB 1ML LL NO SAFETY (SYRINGE) ×1 IMPLANT
TAPE SURG TRANSPORE 1 IN (GAUZE/BANDAGES/DRESSINGS) IMPLANT
TAPE SURGICAL TRANSPORE 1 IN (GAUZE/BANDAGES/DRESSINGS) ×1
VISCOELASTIC ADDITIONAL (OPHTHALMIC RELATED) IMPLANT
WATER STERILE IRR 250ML POUR (IV SOLUTION) ×1 IMPLANT

## 2012-05-30 NOTE — Anesthesia Procedure Notes (Signed)
Procedure Name: MAC Date/Time: 05/30/2012 9:05 AM Performed by: Franco Nones Pre-anesthesia Checklist: Patient identified, Emergency Drugs available, Suction available, Timeout performed and Patient being monitored Patient Re-evaluated:Patient Re-evaluated prior to inductionOxygen Delivery Method: Nasal Cannula

## 2012-05-30 NOTE — Anesthesia Preprocedure Evaluation (Addendum)
Anesthesia Evaluation  Patient identified by MRN, date of birth, ID band Patient awake    Reviewed: Allergy & Precautions, H&P , NPO status , Patient's Chart, lab work & pertinent test results  History of Anesthesia Complications (+) PONV  Airway Mallampati: I TM Distance: >3 FB Neck ROM: Full    Dental  (+) Dental Advisory Given, Edentulous Upper, Partial Lower and Caps   Pulmonary neg pulmonary ROS, Current Smoker,  breath sounds clear to auscultation  Pulmonary exam normal       Cardiovascular negative cardio ROS  Rhythm:Regular Rate:Normal     Neuro/Psych negative neurological ROS  negative psych ROS   GI/Hepatic Neg liver ROS, hiatal hernia, GERD-  ,  Endo/Other  negative endocrine ROS  Renal/GU negative Renal ROS     Musculoskeletal negative musculoskeletal ROS (+) Arthritis -, Rheumatoid disorders,    Abdominal   Peds  Hematology negative hematology ROS (+)   Anesthesia Other Findings   Reproductive/Obstetrics                           Anesthesia Physical Anesthesia Plan  ASA: II  Anesthesia Plan: MAC   Post-op Pain Management:    Induction: Intravenous  Airway Management Planned: Nasal Cannula  Additional Equipment:   Intra-op Plan:   Post-operative Plan:   Informed Consent: I have reviewed the patients History and Physical, chart, labs and discussed the procedure including the risks, benefits and alternatives for the proposed anesthesia with the patient or authorized representative who has indicated his/her understanding and acceptance.     Plan Discussed with:   Anesthesia Plan Comments:         Anesthesia Quick Evaluation

## 2012-05-30 NOTE — H&P (Signed)
I have reviewed the H&P, the patient was re-examined, and I have identified no interval changes in medical condition and plan of care since the history and physical of record  

## 2012-05-30 NOTE — Transfer of Care (Signed)
Immediate Anesthesia Transfer of Care Note  Patient: Sheri Pope  Procedure(s) Performed: Procedure(s) (LRB): CATARACT EXTRACTION PHACO AND INTRAOCULAR LENS PLACEMENT (IOC) (Left)  Patient Location: Shortstay  Anesthesia Type: MAC  Level of Consciousness: awake  Airway & Oxygen Therapy: Patient Spontanous Breathing   Post-op Assessment: Report given to PACU RN, Post -op Vital signs reviewed and stable and Patient moving all extremities  Post vital signs: Reviewed and stable  Complications: No apparent anesthesia complications

## 2012-05-30 NOTE — Anesthesia Postprocedure Evaluation (Signed)
  Anesthesia Post-op Note  Patient: Sheri Pope  Procedure(s) Performed: Procedure(s) (LRB): CATARACT EXTRACTION PHACO AND INTRAOCULAR LENS PLACEMENT (IOC) (Left)  Patient Location:  Short Stay  Anesthesia Type: MAC  Level of Consciousness: awake  Airway and Oxygen Therapy: Patient Spontanous Breathing  Post-op Pain: none  Post-op Assessment: Post-op Vital signs reviewed, Patient's Cardiovascular Status Stable, Respiratory Function Stable, Patent Airway, No signs of Nausea or vomiting and Pain level controlled  Post-op Vital Signs: Reviewed and stable  Complications: No apparent anesthesia complications

## 2012-05-30 NOTE — Op Note (Signed)
Date of Admission: 05/30/12  Date of Surgery: 05/30/12  Pre-Op Dx: Cataract  Left  Eye  Post-Op Dx: Nuclear Cataract  Left  Eye, Dx Code 366.16  Surgeon: Gemma Payor, M.D.  Assistants: None  Anesthesia: Topical with MAC  Indications: Painless, progressive loss of vision with compromise of daily activities.  Surgery: Cataract Extraction with Intraocular lens Implant Left Eye  Discription: The patient had dilating drops and viscous lidocaine placed into the left eye in the pre-op holding area. After transfer to the operating room, a time out was performed. The patient was then prepped and draped. Beginning with a 75 degree blade a paracentesis port was made at the surgeon's 2 o'clock position. The anterior chamber was then filled with 2% non-preserved lidocaine. This was followed by filling the anterior chamber with Provisc. A bent cystatome needle was used to create a continuous tear capsulotomy. Hydrodissection was performed with balanced salt solution on a Fine canula. The lens nucleus was then removed using the phacoemulsification handpiece. Residual cortex was removed with the I&A handpiece. The anterior chamber and capsular bag were refilled with Provisc. A posterior chamber intraocular lens was placed into the capsular bag with it's injector. The implant was positioned with the Kuglan hook. The Provisc was then removed from the anterior chamber and capsular bag with the I&A handpiece. Stromal hydration of the main incision and paracentesis port was performed with BSS on a Fine canula. The wounds were tested for leak which was negative. The patient tolerated the procedure well. There were no operative complications. The patient was then transferred to the recovery room in stable condition.  Prosthetic device: B&L enVista, MX60, power 19.5, #[id number].  Specimen: None  EBL: None  Complications: None

## 2012-06-03 ENCOUNTER — Encounter (HOSPITAL_COMMUNITY): Payer: Self-pay | Admitting: Ophthalmology

## 2012-06-17 DIAGNOSIS — H2510 Age-related nuclear cataract, unspecified eye: Secondary | ICD-10-CM | POA: Diagnosis not present

## 2012-06-20 ENCOUNTER — Encounter (HOSPITAL_COMMUNITY): Payer: Self-pay

## 2012-06-20 ENCOUNTER — Encounter (HOSPITAL_COMMUNITY): Payer: Self-pay | Admitting: Pharmacy Technician

## 2012-06-20 ENCOUNTER — Encounter (HOSPITAL_COMMUNITY)
Admission: RE | Admit: 2012-06-20 | Discharge: 2012-06-20 | Disposition: A | Discharge status: Home / Self Care | Payer: Medicare Other | Source: Ambulatory Visit | Attending: Ophthalmology | Admitting: Ophthalmology

## 2012-06-21 MED ORDER — PHENYLEPHRINE HCL 2.5 % OP SOLN
OPHTHALMIC | Status: AC
  Filled 2012-06-21: qty 2

## 2012-06-21 MED ORDER — CYCLOPENTOLATE-PHENYLEPHRINE 0.2-1 % OP SOLN
OPHTHALMIC | Status: AC
  Filled 2012-06-21: qty 2

## 2012-06-21 MED ORDER — LIDOCAINE HCL 3.5 % OP GEL
OPHTHALMIC | Status: AC
  Filled 2012-06-21: qty 5

## 2012-06-21 MED ORDER — TETRACAINE HCL 0.5 % OP SOLN
OPHTHALMIC | Status: AC
  Filled 2012-06-21: qty 2

## 2012-06-21 MED ORDER — NEOMYCIN-POLYMYXIN-DEXAMETH 3.5-10000-0.1 OP OINT
TOPICAL_OINTMENT | OPHTHALMIC | Status: AC
  Filled 2012-06-21: qty 3.5

## 2012-06-21 MED ORDER — LIDOCAINE HCL (PF) 1 % IJ SOLN
INTRAMUSCULAR | Status: AC
  Filled 2012-06-21: qty 2

## 2012-06-24 ENCOUNTER — Ambulatory Visit (HOSPITAL_COMMUNITY)
Admission: RE | Admit: 2012-06-24 | Discharge: 2012-06-24 | Disposition: A | Discharge status: Home Health | Payer: Medicare Other | Source: Ambulatory Visit | Attending: Ophthalmology | Admitting: Ophthalmology

## 2012-06-24 ENCOUNTER — Ambulatory Visit (HOSPITAL_COMMUNITY): Payer: Medicare Other | Admitting: Anesthesiology

## 2012-06-24 ENCOUNTER — Encounter (HOSPITAL_COMMUNITY): Payer: Self-pay | Admitting: *Deleted

## 2012-06-24 ENCOUNTER — Encounter (HOSPITAL_COMMUNITY)
Admission: RE | Disposition: A | Discharge status: Home Health | Payer: Self-pay | Source: Ambulatory Visit | Attending: Ophthalmology

## 2012-06-24 ENCOUNTER — Encounter (HOSPITAL_COMMUNITY): Payer: Self-pay | Admitting: Anesthesiology

## 2012-06-24 DIAGNOSIS — H2510 Age-related nuclear cataract, unspecified eye: Secondary | ICD-10-CM | POA: Insufficient documentation

## 2012-06-24 DIAGNOSIS — H269 Unspecified cataract: Secondary | ICD-10-CM | POA: Diagnosis not present

## 2012-06-24 HISTORY — PX: CATARACT EXTRACTION W/PHACO: SHX586

## 2012-06-24 SURGERY — PHACOEMULSIFICATION, CATARACT, WITH IOL INSERTION
Anesthesia: Monitor Anesthesia Care | Site: Eye | Laterality: Right | Wound class: Clean

## 2012-06-24 MED ORDER — MIDAZOLAM HCL 2 MG/2ML IJ SOLN
INTRAMUSCULAR | Status: AC
  Filled 2012-06-24: qty 2

## 2012-06-24 MED ORDER — LIDOCAINE HCL (PF) 1 % IJ SOLN
INTRAMUSCULAR | Status: DC | PRN
  Administered 2012-06-24: .4 mL

## 2012-06-24 MED ORDER — NEOMYCIN-POLYMYXIN-DEXAMETH 0.1 % OP OINT
TOPICAL_OINTMENT | OPHTHALMIC | Status: DC | PRN
  Administered 2012-06-24: 1 via OPHTHALMIC

## 2012-06-24 MED ORDER — LIDOCAINE HCL 3.5 % OP GEL
1.0000 "application " | Freq: Once | OPHTHALMIC | Status: AC
  Administered 2012-06-24: 1 via OPHTHALMIC

## 2012-06-24 MED ORDER — ONDANSETRON HCL 4 MG/2ML IJ SOLN
INTRAMUSCULAR | Status: AC
  Filled 2012-06-24: qty 2

## 2012-06-24 MED ORDER — LACTATED RINGERS IV SOLN
INTRAVENOUS | Status: DC | PRN
  Administered 2012-06-24: 14:00:00 via INTRAVENOUS

## 2012-06-24 MED ORDER — PROVISC 10 MG/ML IO SOLN
INTRAOCULAR | Status: DC | PRN
  Administered 2012-06-24: 8.5 mg via INTRAOCULAR

## 2012-06-24 MED ORDER — MIDAZOLAM HCL 2 MG/2ML IJ SOLN
1.0000 mg | INTRAMUSCULAR | Status: DC | PRN
  Administered 2012-06-24: 2 mg via INTRAVENOUS

## 2012-06-24 MED ORDER — PHENYLEPHRINE HCL 2.5 % OP SOLN
1.0000 [drp] | OPHTHALMIC | Status: AC
  Administered 2012-06-24 (×3): 1 [drp] via OPHTHALMIC

## 2012-06-24 MED ORDER — CYCLOPENTOLATE-PHENYLEPHRINE 0.2-1 % OP SOLN
1.0000 [drp] | OPHTHALMIC | Status: AC
  Administered 2012-06-24 (×3): 1 [drp] via OPHTHALMIC

## 2012-06-24 MED ORDER — ONDANSETRON HCL 4 MG/2ML IJ SOLN
4.0000 mg | Freq: Once | INTRAMUSCULAR | Status: AC
  Administered 2012-06-24: 4 mg via INTRAVENOUS

## 2012-06-24 MED ORDER — EPINEPHRINE HCL 1 MG/ML IJ SOLN
INTRAOCULAR | Status: DC | PRN
  Administered 2012-06-24: 15:00:00

## 2012-06-24 MED ORDER — TETRACAINE HCL 0.5 % OP SOLN
1.0000 [drp] | OPHTHALMIC | Status: AC
  Administered 2012-06-24 (×3): 1 [drp] via OPHTHALMIC

## 2012-06-24 MED ORDER — EPINEPHRINE HCL 1 MG/ML IJ SOLN
INTRAMUSCULAR | Status: AC
  Filled 2012-06-24: qty 1

## 2012-06-24 MED ORDER — POVIDONE-IODINE 5 % OP SOLN
OPHTHALMIC | Status: DC | PRN
  Administered 2012-06-24: 1 via OPHTHALMIC

## 2012-06-24 MED ORDER — BSS IO SOLN
INTRAOCULAR | Status: DC | PRN
  Administered 2012-06-24: 15 mL via INTRAOCULAR

## 2012-06-24 MED ORDER — LACTATED RINGERS IV SOLN
INTRAVENOUS | Status: DC
  Administered 2012-06-24: 14:00:00 via INTRAVENOUS

## 2012-06-24 SURGICAL SUPPLY — 34 items
CAPSULAR TENSION RING-AMO (OPHTHALMIC RELATED) IMPLANT
CLOTH BEACON ORANGE TIMEOUT ST (SAFETY) ×1 IMPLANT
EYE SHIELD UNIVERSAL CLEAR (GAUZE/BANDAGES/DRESSINGS) ×1 IMPLANT
GLOVE BIO SURGEON STRL SZ 6.5 (GLOVE) IMPLANT
GLOVE BIOGEL PI IND STRL 6.5 (GLOVE) IMPLANT
GLOVE BIOGEL PI IND STRL 7.0 (GLOVE) IMPLANT
GLOVE BIOGEL PI IND STRL 7.5 (GLOVE) IMPLANT
GLOVE BIOGEL PI INDICATOR 6.5 (GLOVE) ×1
GLOVE BIOGEL PI INDICATOR 7.0 (GLOVE) ×1
GLOVE BIOGEL PI INDICATOR 7.5 (GLOVE)
GLOVE ECLIPSE 6.5 STRL STRAW (GLOVE) IMPLANT
GLOVE ECLIPSE 7.0 STRL STRAW (GLOVE) IMPLANT
GLOVE ECLIPSE 7.5 STRL STRAW (GLOVE) IMPLANT
GLOVE EXAM NITRILE LRG STRL (GLOVE) IMPLANT
GLOVE EXAM NITRILE MD LF STRL (GLOVE) IMPLANT
GLOVE SKINSENSE NS SZ6.5 (GLOVE)
GLOVE SKINSENSE NS SZ7.0 (GLOVE)
GLOVE SKINSENSE NS SZ7.5 (GLOVE) ×1
GLOVE SKINSENSE STRL SZ6.5 (GLOVE) IMPLANT
GLOVE SKINSENSE STRL SZ7.0 (GLOVE) IMPLANT
GLOVE SKINSENSE STRL SZ7.5 (GLOVE) IMPLANT
KIT VITRECTOMY (OPHTHALMIC RELATED) IMPLANT
PAD ARMBOARD 7.5X6 YLW CONV (MISCELLANEOUS) ×1 IMPLANT
PROC W NO LENS (INTRAOCULAR LENS)
PROC W SPEC LENS (INTRAOCULAR LENS)
PROCESS W NO LENS (INTRAOCULAR LENS) IMPLANT
PROCESS W SPEC LENS (INTRAOCULAR LENS) IMPLANT
RING MALYGIN (MISCELLANEOUS) IMPLANT
SIGHTPATH CAT PROC W REG LENS (Ophthalmic Related) ×2 IMPLANT
SYR TB 1ML LL NO SAFETY (SYRINGE) ×1 IMPLANT
TAPE SURG TRANSPORE 1 IN (GAUZE/BANDAGES/DRESSINGS) IMPLANT
TAPE SURGICAL TRANSPORE 1 IN (GAUZE/BANDAGES/DRESSINGS) ×1
VISCOELASTIC ADDITIONAL (OPHTHALMIC RELATED) IMPLANT
WATER STERILE IRR 250ML POUR (IV SOLUTION) ×1 IMPLANT

## 2012-06-24 NOTE — Anesthesia Procedure Notes (Signed)
Procedure Name: MAC Date/Time: 06/24/2012 2:38 PM Performed by: Carolyne Littles, AMY L Pre-anesthesia Checklist: Patient identified, Timeout performed, Emergency Drugs available, Suction available and Patient being monitored Oxygen Delivery Method: Nasal cannula

## 2012-06-24 NOTE — Preoperative (Signed)
Beta Blockers   Reason not to administer Beta Blockers:Not Applicable 

## 2012-06-24 NOTE — Anesthesia Preprocedure Evaluation (Addendum)
Anesthesia Evaluation  Patient identified by MRN, date of birth, ID band Patient awake    Reviewed: Allergy & Precautions, H&P , NPO status , Patient's Chart, lab work & pertinent test results  History of Anesthesia Complications (+) PONV  Airway Mallampati: I TM Distance: >3 FB Neck ROM: Full    Dental  (+) Dental Advisory Given, Edentulous Upper, Partial Lower and Caps   Pulmonary neg pulmonary ROS, Current Smoker,  breath sounds clear to auscultation  Pulmonary exam normal       Cardiovascular negative cardio ROS  Rhythm:Regular Rate:Normal     Neuro/Psych negative neurological ROS  negative psych ROS   GI/Hepatic Neg liver ROS, hiatal hernia, GERD-  ,  Endo/Other  negative endocrine ROS  Renal/GU negative Renal ROS     Musculoskeletal negative musculoskeletal ROS (+) Arthritis -, Rheumatoid disorders,    Abdominal   Peds  Hematology negative hematology ROS (+)   Anesthesia Other Findings   Reproductive/Obstetrics                           Anesthesia Physical Anesthesia Plan  ASA: III  Anesthesia Plan: MAC   Post-op Pain Management:    Induction: Intravenous  Airway Management Planned: Nasal Cannula  Additional Equipment:   Intra-op Plan:   Post-operative Plan:   Informed Consent: I have reviewed the patients History and Physical, chart, labs and discussed the procedure including the risks, benefits and alternatives for the proposed anesthesia with the patient or authorized representative who has indicated his/her understanding and acceptance.     Plan Discussed with:   Anesthesia Plan Comments:         Anesthesia Quick Evaluation

## 2012-06-24 NOTE — Anesthesia Postprocedure Evaluation (Signed)
  Anesthesia Post-op Note  Patient: Sheri Pope  Procedure(s) Performed: Procedure(s) with comments: CATARACT EXTRACTION PHACO AND INTRAOCULAR LENS PLACEMENT (IOC) (Right) - CDE:17.92  Patient Location: Short Stay  Anesthesia Type:MAC  Level of Consciousness: awake, alert , oriented and patient cooperative  Airway and Oxygen Therapy: Patient Spontanous Breathing  Post-op Pain: none  Post-op Assessment: Post-op Vital signs reviewed, Patient's Cardiovascular Status Stable, Respiratory Function Stable, Patent Airway and No signs of Nausea or vomiting  Post-op Vital Signs: Reviewed and stable  Complications: No apparent anesthesia complications

## 2012-06-24 NOTE — H&P (Signed)
I have reviewed the H&P, the patient was re-examined, and I have identified no interval changes in medical condition and plan of care since the history and physical of record  

## 2012-06-24 NOTE — Op Note (Signed)
Date of Admission: 06/24/12  Date of Surgery: 06/24/12  Pre-Op Dx: Cataract Right Eye  Post-Op Dx: Nuclear Cataract Right  Eye, Dx Code 366.16  Surgeon: Gemma Payor, M.D.  Assistants: None  Anesthesia: Topical with MAC  Indications: Painless, progressive loss of vision with compromise of daily activities.  Surgery: Cataract Extraction with Intraocular lens Implant Right Eye  Discription: The patient had dilating drops and viscous lidocaine placed into the left eye in the pre-op holding area. After transfer to the operating room, a time out was performed. The patient was then prepped and draped. Beginning with a 75 degree blade a paracentesis port was made at the surgeon's 2 o'clock position. The anterior chamber was then filled with 2% non-preserved lidocaine. This was followed by filling the anterior chamber with Provisc. A bent cystatome needle was used to create a continuous tear capsulotomy. Hydrodissection was performed with balanced salt solution on a Fine canula. The lens nucleus was then removed using the phacoemulsification handpiece. Residual cortex was removed with the I&A handpiece. The anterior chamber and capsular bag were refilled with Provisc. A posterior chamber intraocular lens was placed into the capsular bag with it's injector. The implant was positioned with the Kuglan hook. The Provisc was then removed from the anterior chamber and capsular bag with the I&A handpiece. Stromal hydration of the main incision and paracentesis port was performed with BSS on a Fine canula. The wounds were tested for leak which was negative. The patient tolerated the procedure well. There were no operative complications. The patient was then transferred to the recovery room in stable condition.  Prosthetic device:  B&L enVista, MX60, power 21.0D.  Specimen: None  EBL: None  Complications: None

## 2012-06-24 NOTE — Transfer of Care (Signed)
Immediate Anesthesia Transfer of Care Note  Patient: Sheri Pope  Procedure(s) Performed: Procedure(s) with comments: CATARACT EXTRACTION PHACO AND INTRAOCULAR LENS PLACEMENT (IOC) (Right) - CDE:17.92  Patient Location: Short Stay  Anesthesia Type:MAC  Level of Consciousness: awake, alert , oriented and patient cooperative  Airway & Oxygen Therapy: Patient Spontanous Breathing  Post-op Assessment: Report given to PACU RN and Post -op Vital signs reviewed and stable  Post vital signs: Reviewed and stable  Complications: No apparent anesthesia complications

## 2012-06-27 ENCOUNTER — Encounter (HOSPITAL_COMMUNITY): Payer: Self-pay | Admitting: Ophthalmology

## 2012-07-24 ENCOUNTER — Other Ambulatory Visit (HOSPITAL_COMMUNITY): Payer: Medicare Other

## 2012-08-15 DIAGNOSIS — W57XXXA Bitten or stung by nonvenomous insect and other nonvenomous arthropods, initial encounter: Secondary | ICD-10-CM | POA: Diagnosis not present

## 2012-08-15 DIAGNOSIS — T148 Other injury of unspecified body region: Secondary | ICD-10-CM | POA: Diagnosis not present

## 2012-09-05 DIAGNOSIS — M069 Rheumatoid arthritis, unspecified: Secondary | ICD-10-CM | POA: Diagnosis not present

## 2012-09-05 DIAGNOSIS — E782 Mixed hyperlipidemia: Secondary | ICD-10-CM | POA: Diagnosis not present

## 2012-09-05 DIAGNOSIS — E559 Vitamin D deficiency, unspecified: Secondary | ICD-10-CM | POA: Diagnosis not present

## 2012-09-12 DIAGNOSIS — E559 Vitamin D deficiency, unspecified: Secondary | ICD-10-CM | POA: Diagnosis not present

## 2012-09-12 DIAGNOSIS — T148 Other injury of unspecified body region: Secondary | ICD-10-CM | POA: Diagnosis not present

## 2012-09-12 DIAGNOSIS — M069 Rheumatoid arthritis, unspecified: Secondary | ICD-10-CM | POA: Diagnosis not present

## 2012-09-12 DIAGNOSIS — W57XXXA Bitten or stung by nonvenomous insect and other nonvenomous arthropods, initial encounter: Secondary | ICD-10-CM | POA: Diagnosis not present

## 2012-09-12 DIAGNOSIS — E782 Mixed hyperlipidemia: Secondary | ICD-10-CM | POA: Diagnosis not present

## 2013-01-27 DIAGNOSIS — M1710 Unilateral primary osteoarthritis, unspecified knee: Secondary | ICD-10-CM | POA: Diagnosis not present

## 2013-06-04 DIAGNOSIS — R209 Unspecified disturbances of skin sensation: Secondary | ICD-10-CM | POA: Diagnosis not present

## 2013-06-04 DIAGNOSIS — I781 Nevus, non-neoplastic: Secondary | ICD-10-CM | POA: Diagnosis not present

## 2013-06-04 DIAGNOSIS — D235 Other benign neoplasm of skin of trunk: Secondary | ICD-10-CM | POA: Diagnosis not present

## 2013-06-18 DIAGNOSIS — M549 Dorsalgia, unspecified: Secondary | ICD-10-CM | POA: Diagnosis not present

## 2013-06-18 DIAGNOSIS — IMO0002 Reserved for concepts with insufficient information to code with codable children: Secondary | ICD-10-CM | POA: Diagnosis not present

## 2013-07-03 DIAGNOSIS — H52229 Regular astigmatism, unspecified eye: Secondary | ICD-10-CM | POA: Diagnosis not present

## 2013-07-03 DIAGNOSIS — H3560 Retinal hemorrhage, unspecified eye: Secondary | ICD-10-CM | POA: Diagnosis not present

## 2013-07-03 DIAGNOSIS — Z0100 Encounter for examination of eyes and vision without abnormal findings: Secondary | ICD-10-CM | POA: Diagnosis not present

## 2013-07-03 DIAGNOSIS — H524 Presbyopia: Secondary | ICD-10-CM | POA: Diagnosis not present

## 2014-08-05 DIAGNOSIS — H26499 Other secondary cataract, unspecified eye: Secondary | ICD-10-CM | POA: Diagnosis not present

## 2014-08-05 DIAGNOSIS — H524 Presbyopia: Secondary | ICD-10-CM | POA: Diagnosis not present

## 2014-08-05 DIAGNOSIS — Z961 Presence of intraocular lens: Secondary | ICD-10-CM | POA: Diagnosis not present

## 2014-08-05 DIAGNOSIS — H52223 Regular astigmatism, bilateral: Secondary | ICD-10-CM | POA: Diagnosis not present

## 2015-08-25 DIAGNOSIS — H3562 Retinal hemorrhage, left eye: Secondary | ICD-10-CM | POA: Diagnosis not present

## 2015-08-25 DIAGNOSIS — Z961 Presence of intraocular lens: Secondary | ICD-10-CM | POA: Diagnosis not present

## 2015-08-25 DIAGNOSIS — H52223 Regular astigmatism, bilateral: Secondary | ICD-10-CM | POA: Diagnosis not present

## 2015-08-25 DIAGNOSIS — H524 Presbyopia: Secondary | ICD-10-CM | POA: Diagnosis not present

## 2015-10-04 DIAGNOSIS — H26493 Other secondary cataract, bilateral: Secondary | ICD-10-CM | POA: Diagnosis not present

## 2015-10-04 DIAGNOSIS — H26492 Other secondary cataract, left eye: Secondary | ICD-10-CM | POA: Diagnosis not present

## 2015-10-04 DIAGNOSIS — H26491 Other secondary cataract, right eye: Secondary | ICD-10-CM | POA: Diagnosis not present

## 2016-10-20 DIAGNOSIS — H3562 Retinal hemorrhage, left eye: Secondary | ICD-10-CM | POA: Diagnosis not present

## 2016-12-04 DIAGNOSIS — H43813 Vitreous degeneration, bilateral: Secondary | ICD-10-CM | POA: Diagnosis not present

## 2016-12-04 DIAGNOSIS — H35371 Puckering of macula, right eye: Secondary | ICD-10-CM | POA: Diagnosis not present

## 2016-12-11 ENCOUNTER — Other Ambulatory Visit (HOSPITAL_COMMUNITY)
Admission: RE | Admit: 2016-12-11 | Discharge: 2016-12-11 | Disposition: A | Discharge status: Home / Self Care | Payer: Medicare Other | Source: Ambulatory Visit | Attending: Ophthalmology | Admitting: Ophthalmology

## 2016-12-11 DIAGNOSIS — H3509 Other intraretinal microvascular abnormalities: Secondary | ICD-10-CM | POA: Insufficient documentation

## 2016-12-11 LAB — CBC WITH DIFFERENTIAL/PLATELET
Basophils Absolute: 0 10*3/uL (ref 0.0–0.1)
Basophils Relative: 1 %
EOS ABS: 0.2 10*3/uL (ref 0.0–0.7)
Eosinophils Relative: 2 %
HEMATOCRIT: 39.8 % (ref 36.0–46.0)
HEMOGLOBIN: 13.2 g/dL (ref 12.0–15.0)
LYMPHS ABS: 2.8 10*3/uL (ref 0.7–4.0)
Lymphocytes Relative: 38 %
MCH: 34.3 pg — AB (ref 26.0–34.0)
MCHC: 33.2 g/dL (ref 30.0–36.0)
MCV: 103.4 fL — AB (ref 78.0–100.0)
MONOS PCT: 7 %
Monocytes Absolute: 0.5 10*3/uL (ref 0.1–1.0)
Neutro Abs: 3.9 10*3/uL (ref 1.7–7.7)
Neutrophils Relative %: 52 %
Platelets: 363 10*3/uL (ref 150–400)
RBC: 3.85 MIL/uL — ABNORMAL LOW (ref 3.87–5.11)
RDW: 14.5 % (ref 11.5–15.5)
WBC: 7.4 10*3/uL (ref 4.0–10.5)

## 2016-12-11 LAB — COMPREHENSIVE METABOLIC PANEL
ALT: 10 U/L — AB (ref 14–54)
ANION GAP: 8 (ref 5–15)
AST: 13 U/L — ABNORMAL LOW (ref 15–41)
Albumin: 3.4 g/dL — ABNORMAL LOW (ref 3.5–5.0)
Alkaline Phosphatase: 95 U/L (ref 38–126)
BILIRUBIN TOTAL: 0.4 mg/dL (ref 0.3–1.2)
BUN: 11 mg/dL (ref 6–20)
CO2: 25 mmol/L (ref 22–32)
CREATININE: 0.76 mg/dL (ref 0.44–1.00)
Calcium: 9 mg/dL (ref 8.9–10.3)
Chloride: 101 mmol/L (ref 101–111)
Glucose, Bld: 96 mg/dL (ref 65–99)
Potassium: 4 mmol/L (ref 3.5–5.1)
Sodium: 134 mmol/L — ABNORMAL LOW (ref 135–145)
TOTAL PROTEIN: 7.6 g/dL (ref 6.5–8.1)

## 2017-01-09 DIAGNOSIS — H35371 Puckering of macula, right eye: Secondary | ICD-10-CM | POA: Diagnosis not present

## 2017-01-09 DIAGNOSIS — H43813 Vitreous degeneration, bilateral: Secondary | ICD-10-CM | POA: Diagnosis not present

## 2017-03-07 ENCOUNTER — Ambulatory Visit (INDEPENDENT_AMBULATORY_CARE_PROVIDER_SITE_OTHER): Payer: Medicare Other | Admitting: Physician Assistant

## 2017-03-07 ENCOUNTER — Encounter (INDEPENDENT_AMBULATORY_CARE_PROVIDER_SITE_OTHER): Payer: Self-pay | Admitting: Physician Assistant

## 2017-03-07 ENCOUNTER — Ambulatory Visit (INDEPENDENT_AMBULATORY_CARE_PROVIDER_SITE_OTHER): Payer: Medicare Other

## 2017-03-07 VITALS — Ht 61.0 in | Wt 110.0 lb

## 2017-03-07 DIAGNOSIS — M25562 Pain in left knee: Secondary | ICD-10-CM | POA: Diagnosis not present

## 2017-03-07 NOTE — Progress Notes (Signed)
Office Visit Note   Patient: Sheri Pope           Date of Birth: [birth year]           MRN: [id number] Visit Date: 03/07/2017              Requested by: Celene Squibb, MD [address] [place], [place] [address] PCP: Celene Squibb, MD   Assessment & Plan: Visit Diagnoses:  1. Acute pain of left knee     Plan: I have her work on quad strengthening left knee.  Placed her in a hinged knee brace.  Like to see her back in 6 weeks check progress lack of.  She may require a left knee poly-exchange with a thicker poly-.  Questions were encouraged and answered by Dr. Ninfa Linden and myself.  Patient was evaluated and examined by Dr. Ninfa Linden and myself.  Follow-Up Instructions: Return in about 6 weeks (around 04/18/2017).   Orders:  Orders Placed This Encounter  Procedures  . XR Knee 1-2 Views Left   No orders of the defined types were placed in this encounter.     Procedures: No procedures performed   Clinical Data: No additional findings.   Subjective: Chief Complaint  Patient presents with  . Left Knee - Pain    HPI Sheri Pope is well-known to Dr. Ninfa Linden service comes in today due to left knee pain.  She has a history of left total knee replacement 11/10/2011.  She states since before Christmas she has been feeling a slipping sensation in the knee.  She notes pain and burning after the knee slips.  Is questioning if there is some type of recall on the knee components.  She has tried some Aleve but is unable to take it for prolonged period time due to acid reflux.  She is also tried Aspercreme.  She had no known injury to the knee.  She has rheumatoid arthritis. Review of Systems See HPI otherwise negative  Objective: Vital Signs: Ht 5\' 1"  (1.549 m)   Wt 110 lb (49.9 kg)   BMI 20.78 kg/m   Physical Exam  Constitutional: She is oriented to person, place, and time. She appears well-developed and well-nourished. No distress.  Pulmonary/Chest: Effort normal.    Neurological: She is alert and oriented to person, place, and time.  Skin: She is not diaphoretic.  Psychiatric: She has a normal mood and affect. Her behavior is normal.    Ortho Exam Left knee full extension flexion to approximately 110 degrees.  No instability valgus varus stressing.  Anterior drawer she she has a fair amount of anterior translation.  This maneuver causes her pain in the knee.  No effusion abnormal warmth erythema of the knee.  Surgical incisions well-healed.  Left calf supple and nontender Specialty Comments:  No specialty comments available.  Imaging: Xr Knee 1-2 Views Left  Result Date: 03/07/2017 Left knee AP lateral views: Status post left total knee arthroplasty well-seated components.  No acute fracture.  No subluxation dislocation of the knee.    PMFS History: Patient Active Problem List   Diagnosis Date Noted  . Degenerative arthritis of left knee 11/10/2011   Past Medical History:  Diagnosis Date  . Arthritis    RA AND OA --PAIN AND SWELLIN IN LEFT KNEE  . Blood in urine    NEGATIVE UROLOGY WORK UP --BUT ALWAYS HAS BLOOD IN URINE  . GERD (gastroesophageal reflux disease)   . H/O hiatal hernia   .  Hyperlipidemia   . PONV (postoperative nausea and vomiting)    ALWAYS SICK AFTER SURGERIES EXCEPT AFTER HIP REPLACMENT 2007  . Vitamin D deficiency     No family history on file.  Past Surgical History:  Procedure Laterality Date  . ABDOMINAL HYSTERECTOMY  1870'S  . BREAST SURGERY     BREAST BIOSPIES  . CATARACT EXTRACTION W/PHACO Left 05/30/2012   Procedure: CATARACT EXTRACTION PHACO AND INTRAOCULAR LENS PLACEMENT (IOC);  Surgeon: Tonny Branch, MD;  Location: [facility];  Service: Ophthalmology;  Laterality: Left;  CDE: 16.99  . CATARACT EXTRACTION W/PHACO Right 06/24/2012   Procedure: CATARACT EXTRACTION PHACO AND INTRAOCULAR LENS PLACEMENT (IOC);  Surgeon: Tonny Branch, MD;  Location: [facility];  Service: Ophthalmology;  Laterality: Right;  CDE:17.92  . JOINT  REPLACEMENT  2007   RIGHT HIP REPLACEMENT  . LEFT KNEE ARTHROSCOPY  AUG 2012  . MULTIPLE ORTHOPEDIC SURGERIES     ON BOTH HANDS AND BOTH FEET, RIGHT ELBOW  . SPLEENECTOMY  IN THE 70'S   FOR LARGE CYST  . TOTAL KNEE ARTHROPLASTY  11/10/2011   Procedure: TOTAL KNEE ARTHROPLASTY;  Surgeon: Mcarthur Rossetti, MD;  Location: [facility];  Service: Orthopedics;  Laterality: Left;  Left Total Knee Arthroplasty   Social History   Occupational History  . Not on file  Tobacco Use  . Smoking status: Current Some Day Smoker    Packs/day: 1.00    Years: 50.00    Pack years: 50.00    Types: Cigarettes  . Smokeless tobacco: Never Used  Substance and Sexual Activity  . Alcohol use: No  . Drug use: No  . Sexual activity: Yes    Birth control/protection: Surgical

## 2017-04-23 ENCOUNTER — Encounter (INDEPENDENT_AMBULATORY_CARE_PROVIDER_SITE_OTHER): Payer: Self-pay | Admitting: Physician Assistant

## 2017-04-23 ENCOUNTER — Ambulatory Visit (INDEPENDENT_AMBULATORY_CARE_PROVIDER_SITE_OTHER): Payer: Medicare Other

## 2017-04-23 ENCOUNTER — Ambulatory Visit (INDEPENDENT_AMBULATORY_CARE_PROVIDER_SITE_OTHER): Payer: Medicare Other | Admitting: Physician Assistant

## 2017-04-23 DIAGNOSIS — G8929 Other chronic pain: Secondary | ICD-10-CM

## 2017-04-23 DIAGNOSIS — M25561 Pain in right knee: Secondary | ICD-10-CM | POA: Diagnosis not present

## 2017-04-23 MED ORDER — LIDOCAINE HCL 1 % IJ SOLN
3.0000 mL | INTRAMUSCULAR | Status: AC | PRN
  Administered 2017-04-23: 3 mL

## 2017-04-23 MED ORDER — METHYLPREDNISOLONE ACETATE 40 MG/ML IJ SUSP
40.0000 mg | INTRAMUSCULAR | Status: AC | PRN
  Administered 2017-04-23: 40 mg via INTRA_ARTICULAR

## 2017-04-23 NOTE — Progress Notes (Signed)
   Procedure Note  Patient: Sheri Pope             Date of Birth: 24-Aug-1940           MRN: [id number]             Visit Date: 04/23/2017 HPI: Sheri Pope returns today stating that her left knee is overall doing better with the brace and exercises main complaint today is her right knee which is having some pain and.  She has had no known injury to the knee.  Is requesting injection.  Did have to stop using the hinged knee brace she got from the office due to the fact that is cutting into her skin.  She went to over-the-counter braces which caused her to have a rash in both knees due to her latex allergy.  Physical exam: Bilateral knees she has good range of motion of both knees without pain.  Left knee and less laxity with anterior drawer today than last exam.  No instability valgus varus stressing.  Right knee she has no tenderness along medial lateral joint line.  McMurray's is negative.  No effusion abnormal warmth of either knee. Contact dermatitis  rash both knees.    Radiographs: Right knee AP lateral views showed no acute fracture.  Mild to moderate narrowing medial and  lateral joint line.  Knee is well located.  Procedures: Visit Diagnoses: Chronic pain of right knee - Plan: XR Knee 1-2 Views Right  Large Joint Inj: L knee on 04/23/2017 10:53 AM Indications: pain Details: 22 G 1.5 in needle, anterolateral approach  Arthrogram: No  Medications: 3 mL lidocaine 1 %; 40 mg methylPREDNISolone acetate 40 MG/ML Outcome: tolerated well, no immediate complications Procedure, treatment alternatives, risks and benefits explained, specific risks discussed. Consent was given by the patient. Patient was prepped and draped in the usual sterile fashion.     Plan: We will see her back in 2 weeks check progress lack time.  She will continue work on quad strengthening both knees.  In regards to the contact dermatitis recommended hydrocortisone to these areas and refrain from using the braces  for about at [facility].  She states that a Ace bandage does benefit her and therefore she is given to Ace bandage today.

## 2017-05-07 ENCOUNTER — Ambulatory Visit (INDEPENDENT_AMBULATORY_CARE_PROVIDER_SITE_OTHER): Payer: Medicare Other | Admitting: Physician Assistant

## 2017-12-10 DIAGNOSIS — K219 Gastro-esophageal reflux disease without esophagitis: Secondary | ICD-10-CM | POA: Diagnosis not present

## 2017-12-10 DIAGNOSIS — M25569 Pain in unspecified knee: Secondary | ICD-10-CM | POA: Diagnosis not present

## 2017-12-10 DIAGNOSIS — Z6822 Body mass index (BMI) 22.0-22.9, adult: Secondary | ICD-10-CM | POA: Diagnosis not present

## 2017-12-10 DIAGNOSIS — M069 Rheumatoid arthritis, unspecified: Secondary | ICD-10-CM | POA: Diagnosis not present

## 2017-12-10 DIAGNOSIS — G9009 Other idiopathic peripheral autonomic neuropathy: Secondary | ICD-10-CM | POA: Diagnosis not present

## 2017-12-10 DIAGNOSIS — H33309 Unspecified retinal break, unspecified eye: Secondary | ICD-10-CM | POA: Diagnosis not present

## 2017-12-11 DIAGNOSIS — E782 Mixed hyperlipidemia: Secondary | ICD-10-CM | POA: Diagnosis not present

## 2017-12-11 DIAGNOSIS — E559 Vitamin D deficiency, unspecified: Secondary | ICD-10-CM | POA: Diagnosis not present

## 2017-12-17 DIAGNOSIS — R030 Elevated blood-pressure reading, without diagnosis of hypertension: Secondary | ICD-10-CM | POA: Diagnosis not present

## 2017-12-17 DIAGNOSIS — Z0000 Encounter for general adult medical examination without abnormal findings: Secondary | ICD-10-CM | POA: Diagnosis not present

## 2017-12-17 DIAGNOSIS — G9009 Other idiopathic peripheral autonomic neuropathy: Secondary | ICD-10-CM | POA: Diagnosis not present

## 2017-12-17 DIAGNOSIS — H33009 Unspecified retinal detachment with retinal break, unspecified eye: Secondary | ICD-10-CM | POA: Diagnosis not present

## 2017-12-17 DIAGNOSIS — K219 Gastro-esophageal reflux disease without esophagitis: Secondary | ICD-10-CM | POA: Diagnosis not present

## 2017-12-17 DIAGNOSIS — Z6822 Body mass index (BMI) 22.0-22.9, adult: Secondary | ICD-10-CM | POA: Diagnosis not present

## 2017-12-17 DIAGNOSIS — M0579 Rheumatoid arthritis with rheumatoid factor of multiple sites without organ or systems involvement: Secondary | ICD-10-CM | POA: Diagnosis not present

## 2017-12-17 DIAGNOSIS — R05 Cough: Secondary | ICD-10-CM | POA: Diagnosis not present

## 2017-12-17 DIAGNOSIS — M25561 Pain in right knee: Secondary | ICD-10-CM | POA: Diagnosis not present

## 2017-12-17 DIAGNOSIS — E559 Vitamin D deficiency, unspecified: Secondary | ICD-10-CM | POA: Diagnosis not present

## 2017-12-19 ENCOUNTER — Encounter (INDEPENDENT_AMBULATORY_CARE_PROVIDER_SITE_OTHER): Payer: Self-pay | Admitting: Orthopaedic Surgery

## 2017-12-19 ENCOUNTER — Ambulatory Visit (INDEPENDENT_AMBULATORY_CARE_PROVIDER_SITE_OTHER): Payer: Medicare Other | Admitting: Orthopaedic Surgery

## 2017-12-19 DIAGNOSIS — M7062 Trochanteric bursitis, left hip: Secondary | ICD-10-CM | POA: Insufficient documentation

## 2017-12-19 MED ORDER — LIDOCAINE HCL 1 % IJ SOLN
3.0000 mL | INTRAMUSCULAR | Status: AC | PRN
  Administered 2017-12-19: 3 mL

## 2017-12-19 MED ORDER — METHYLPREDNISOLONE ACETATE 40 MG/ML IJ SUSP
40.0000 mg | INTRAMUSCULAR | Status: AC | PRN
  Administered 2017-12-19: 40 mg via INTRA_ARTICULAR

## 2017-12-19 MED ORDER — DICLOFENAC SODIUM 1 % TD GEL: 2.0000 g | Freq: Four times a day (QID) | TRANSDERMAL | 3 refills | Status: DC

## 2017-12-19 NOTE — Progress Notes (Signed)
Office Visit Note   Patient: Sheri Pope           Date of Birth: 01-17-1941           MRN: [id number] Visit Date: 12/19/2017              Requested by: Celene Squibb, MD [address] [place], [place] [address] PCP: Celene Squibb, MD   Assessment & Plan: Visit Diagnoses:  1. Trochanteric bursitis, left hip     Plan: I do feel that she would benefit from a steroid injection of the trochanteric area and the IT band on the left side and she agrees with this and tolerated well.  I am glad we did not pursue surgery as that she said she seems to be doing better overall.  I will send in some Voltaren gel to see if her insurance will cover it.  She is getting to where she cannot drive as well and would prefer not to get outpatient physical therapy.  I did show her stretching exercises I want to try for her IT band and trochanteric area.  If she can do these several times a day this may help.  I would like to see her back in about 3 months to see how she is doing overall but no x-rays are needed.  Follow-Up Instructions: Return in about 3 months (around 03/21/2018).   Orders:  Orders Placed This Encounter  Procedures  . Large Joint Inj   Meds ordered this encounter  Medications  . diclofenac sodium (VOLTAREN) 1 % GEL    Sig: Apply 2 g topically 4 (four) times daily.    Dispense:  100 g    Refill:  3      Procedures: Large Joint Inj: L greater trochanter on 12/19/2017 9:05 AM Indications: pain and diagnostic evaluation Details: 22 G 1.5 in needle, lateral approach  Arthrogram: No  Medications: 3 mL lidocaine 1 %; 40 mg methylPREDNISolone acetate 40 MG/ML Outcome: tolerated well, no immediate complications Procedure, treatment alternatives, risks and benefits explained, specific risks discussed. Consent was given by the patient. Immediately prior to procedure a time out was called to verify the correct patient, procedure, equipment, support staff and site/side marked as  required. Patient was prepped and draped in the usual sterile fashion.       Clinical Data: No additional findings.   Subjective: Chief Complaint  Patient presents with  . Left Leg - Pain  The patient is well-known to me.  She is a very pleasant 77 year old with rheumatoid disease who has a history of a left total knee arthroplasty that we did 6 years ago.  We saw her in January we felt that there was may be some play in her knee and she need to work on quad strengthening exercises.  We talked about the possibility of a polyliner exchange.  She is been wearing a knee brace as well.  Her knee is doing better overall but she is been experiencing pain on the lateral aspect of her left hip and her left leg.  HPI  Review of Systems She currently denies any headache, chest pain, shortness of breath, fever, chills, nausea, vomiting.  Objective: Vital Signs: There were no vitals taken for this visit.  Physical Exam She is alert and oriented x3 and in no acute distress Ortho Exam On exam she is very petite individual.  She has rheumatoid nodules around both hands and fingers.  She has rheumatoid disease and  most of her joints in general.  On examination of her left knee today there is no swelling in the knee.  She has full range of motion of the left knee and feels grossly stable on exam.  She has a lot of pain over the IT band and the trochanteric area of her hip and leg.  This is on the left side. Specialty Comments:  No specialty comments available.  Imaging: No results found.   PMFS History: Patient Active Problem List   Diagnosis Date Noted  . Trochanteric bursitis, left hip 12/19/2017  . Degenerative arthritis of left knee 11/10/2011   Past Medical History:  Diagnosis Date  . Arthritis    RA AND OA --PAIN AND SWELLIN IN LEFT KNEE  . Blood in urine    NEGATIVE UROLOGY WORK UP --BUT ALWAYS HAS BLOOD IN URINE  . GERD (gastroesophageal reflux disease)   . H/O hiatal hernia     . Hyperlipidemia   . PONV (postoperative nausea and vomiting)    ALWAYS SICK AFTER SURGERIES EXCEPT AFTER HIP REPLACMENT 2007  . Vitamin D deficiency     History reviewed. No pertinent family history.  Past Surgical History:  Procedure Laterality Date  . ABDOMINAL HYSTERECTOMY  1870'S  . BREAST SURGERY     BREAST BIOSPIES  . CATARACT EXTRACTION W/PHACO Left 05/30/2012   Procedure: CATARACT EXTRACTION PHACO AND INTRAOCULAR LENS PLACEMENT (IOC);  Surgeon: Tonny Branch, MD;  Location: [facility];  Service: Ophthalmology;  Laterality: Left;  CDE: 16.99  . CATARACT EXTRACTION W/PHACO Right 06/24/2012   Procedure: CATARACT EXTRACTION PHACO AND INTRAOCULAR LENS PLACEMENT (IOC);  Surgeon: Tonny Branch, MD;  Location: [facility];  Service: Ophthalmology;  Laterality: Right;  CDE:17.92  . JOINT REPLACEMENT  2007   RIGHT HIP REPLACEMENT  . LEFT KNEE ARTHROSCOPY  AUG 2012  . MULTIPLE ORTHOPEDIC SURGERIES     ON BOTH HANDS AND BOTH FEET, RIGHT ELBOW  . SPLEENECTOMY  IN THE 70'S   FOR LARGE CYST  . TOTAL KNEE ARTHROPLASTY  11/10/2011   Procedure: TOTAL KNEE ARTHROPLASTY;  Surgeon: Mcarthur Rossetti, MD;  Location: [facility];  Service: Orthopedics;  Laterality: Left;  Left Total Knee Arthroplasty   Social History   Occupational History  . Not on file  Tobacco Use  . Smoking status: Current Some Day Smoker    Packs/day: 1.00    Years: 50.00    Pack years: 50.00    Types: Cigarettes  . Smokeless tobacco: Never Used  Substance and Sexual Activity  . Alcohol use: No  . Drug use: No  . Sexual activity: Yes    Birth control/protection: Surgical

## 2018-01-16 DIAGNOSIS — H43813 Vitreous degeneration, bilateral: Secondary | ICD-10-CM | POA: Diagnosis not present

## 2018-01-16 DIAGNOSIS — H35373 Puckering of macula, bilateral: Secondary | ICD-10-CM | POA: Diagnosis not present

## 2018-03-25 ENCOUNTER — Ambulatory Visit (INDEPENDENT_AMBULATORY_CARE_PROVIDER_SITE_OTHER): Payer: Medicare Other | Admitting: Orthopaedic Surgery

## 2018-04-08 ENCOUNTER — Encounter (INDEPENDENT_AMBULATORY_CARE_PROVIDER_SITE_OTHER): Payer: Self-pay | Admitting: Physician Assistant

## 2018-04-08 ENCOUNTER — Ambulatory Visit (INDEPENDENT_AMBULATORY_CARE_PROVIDER_SITE_OTHER): Payer: Medicare HMO | Admitting: Physician Assistant

## 2018-04-08 ENCOUNTER — Ambulatory Visit (INDEPENDENT_AMBULATORY_CARE_PROVIDER_SITE_OTHER): Payer: Medicare HMO

## 2018-04-08 DIAGNOSIS — M79604 Pain in right leg: Secondary | ICD-10-CM

## 2018-04-08 DIAGNOSIS — M25551 Pain in right hip: Secondary | ICD-10-CM | POA: Diagnosis not present

## 2018-04-08 DIAGNOSIS — R2241 Localized swelling, mass and lump, right lower limb: Secondary | ICD-10-CM

## 2018-04-08 NOTE — Progress Notes (Signed)
Office Visit Note   Patient: Sheri Pope           Date of Birth: [birth year]           MRN: [id number] Visit Date: 04/08/2018              Requested by: Celene Squibb, MD [address] [place], [place] [address] PCP: Celene Squibb, MD   Assessment & Plan: Visit Diagnoses:  1. Pain in right hip   2. Pain in right leg     Plan: We will obtain an MRI of her right tib-fib with contrast to evaluate the right lower leg mass and also to rule out a stress fracture of the right lower leg.  She is otherwise activities as tolerated we will see her back after the MRI to go over results and discuss further treatment.  Questions encouraged and answered at length  Follow-Up Instructions: No follow-ups on file.   Orders:  Orders Placed This Encounter  Procedures  . XR Pelvis 1-2 Views  . XR Tibia/Fibula Right   No orders of the defined types were placed in this encounter.     Procedures: No procedures performed   Clinical Data: No additional findings.   Subjective: Chief Complaint  Patient presents with  . Right Leg - Pain    HPI Sheri Pope is a 78 year old female well-known to Dr. Ninfa Linden service comes in today with right leg pain.  She states she fell last Sunday had pain in her neck and the back of her knee and calf.  She denies any loss of consciousness or dizziness.  She states she just lost her balance she did kind of sit down but sat down on the bucket.  She was able to get up on her own after the fall.  States she wore cervical collar for a few days and then discontinued this and there are having neck pain.  She is also having some right shoulder pain after the injury but this is resolved.  Her right leg pain continues.  She denies any numbness tingling down the leg.  She is concerned about a right total hip which was performed in approximately 2007.  Main complaint is pain on weightbearing on the right leg mostly in the right lower leg and calf  region.  She notes that she has a nodule over the anterior aspect of her right leg that is becoming larger.  She has a similar nodule on the left leg that is not painful.  She does have a rheumatoid arthritis. Review of Systems See HPI  Objective: Vital Signs: There were no vitals taken for this visit.  Physical Exam Constitutional:      Appearance: She is normal weight. She is not ill-appearing or diaphoretic.  Pulmonary:     Effort: Pulmonary effort is normal.  Neurological:     Mental Status: She is alert and oriented to person, place, and time.     Ortho Exam Bilateral hips excellent range of motion.  Tenderness over the trochanteric region of both hips right greater than left.  Tenderness down the right IT band.  Right knee full range of motion without pain.  Nontender medial lateral joint line no instability valgus varus stressing.  Right calf supple nontender.  Firm nodule over the distal anterior fibular shaft no abnormal warmth or erythema.  Significant tenderness with palpation over the right lower leg nodules.  Similar nodule on the left distal anterior tibia however this is less firm and nontender.  Specialty Comments:  No specialty comments available.  Imaging: Xr Pelvis 1-2 Views  Result Date: 04/08/2018 AP pelvis lateral view of the right hip: Bilateral hips well located.  No acute fractures.  Status post right total hip arthroplasty with well-seated components no hardware failure.  Xr Tibia/fibula Right  Result Date: 04/08/2018 Right tib-fib: Sclerotic changes distal tibia no cortical erosion changes.  Soft tissue swelling over the area of sclerotic change distal tibia.  No acute fractures.  Knee and ankle joint appear well maintained.  Arthritic changes of the hindfoot foot.    PMFS History: Patient Active Problem List   Diagnosis Date Noted  . Trochanteric bursitis, left hip 12/19/2017  . Degenerative arthritis of left knee 11/10/2011   Past Medical History:    Diagnosis Date  . Arthritis    RA AND OA --PAIN AND SWELLIN IN LEFT KNEE  . Blood in urine    NEGATIVE UROLOGY WORK UP --BUT ALWAYS HAS BLOOD IN URINE  . GERD (gastroesophageal reflux disease)   . H/O hiatal hernia   . Hyperlipidemia   . PONV (postoperative nausea and vomiting)    ALWAYS SICK AFTER SURGERIES EXCEPT AFTER HIP REPLACMENT 2007  . Vitamin D deficiency     History reviewed. No pertinent family history.  Past Surgical History:  Procedure Laterality Date  . ABDOMINAL HYSTERECTOMY  1870'S  . BREAST SURGERY     BREAST BIOSPIES  . CATARACT EXTRACTION W/PHACO Left 05/30/2012   Procedure: CATARACT EXTRACTION PHACO AND INTRAOCULAR LENS PLACEMENT (IOC);  Surgeon: Tonny Branch, MD;  Location: [facility];  Service: Ophthalmology;  Laterality: Left;  CDE: 16.99  . CATARACT EXTRACTION W/PHACO Right 06/24/2012   Procedure: CATARACT EXTRACTION PHACO AND INTRAOCULAR LENS PLACEMENT (IOC);  Surgeon: Tonny Branch, MD;  Location: [facility];  Service: Ophthalmology;  Laterality: Right;  CDE:17.92  . JOINT REPLACEMENT  2007   RIGHT HIP REPLACEMENT  . LEFT KNEE ARTHROSCOPY  AUG 2012  . MULTIPLE ORTHOPEDIC SURGERIES     ON BOTH HANDS AND BOTH FEET, RIGHT ELBOW  . SPLEENECTOMY  IN THE 70'S   FOR LARGE CYST  . TOTAL KNEE ARTHROPLASTY  11/10/2011   Procedure: TOTAL KNEE ARTHROPLASTY;  Surgeon: Mcarthur Rossetti, MD;  Location: [facility];  Service: Orthopedics;  Laterality: Left;  Left Total Knee Arthroplasty   Social History   Occupational History  . Not on file  Tobacco Use  . Smoking status: Current Some Day Smoker    Packs/day: 1.00    Years: 50.00    Pack years: 50.00    Types: Cigarettes  . Smokeless tobacco: Never Used  Substance and Sexual Activity  . Alcohol use: No  . Drug use: No  . Sexual activity: Yes    Birth control/protection: Surgical

## 2018-04-09 ENCOUNTER — Other Ambulatory Visit (INDEPENDENT_AMBULATORY_CARE_PROVIDER_SITE_OTHER): Payer: Self-pay

## 2018-04-09 DIAGNOSIS — IMO0002 Reserved for concepts with insufficient information to code with codable children: Secondary | ICD-10-CM

## 2018-04-09 DIAGNOSIS — R229 Localized swelling, mass and lump, unspecified: Principal | ICD-10-CM

## 2018-04-17 ENCOUNTER — Ambulatory Visit (HOSPITAL_COMMUNITY)
Admission: RE | Admit: 2018-04-17 | Discharge: 2018-04-17 | Disposition: A | Discharge status: Home / Self Care | Payer: Medicare HMO | Source: Ambulatory Visit | Attending: Orthopaedic Surgery | Admitting: Orthopaedic Surgery

## 2018-04-17 DIAGNOSIS — R229 Localized swelling, mass and lump, unspecified: Secondary | ICD-10-CM | POA: Insufficient documentation

## 2018-04-17 DIAGNOSIS — IMO0002 Reserved for concepts with insufficient information to code with codable children: Secondary | ICD-10-CM

## 2018-04-17 DIAGNOSIS — R2241 Localized swelling, mass and lump, right lower limb: Secondary | ICD-10-CM | POA: Diagnosis not present

## 2018-04-17 LAB — POCT I-STAT CREATININE: Creatinine, Ser: 0.7 mg/dL (ref 0.44–1.00)

## 2018-04-17 MED ORDER — GADOBUTROL 1 MMOL/ML IV SOLN
4.0000 mL | Freq: Once | INTRAVENOUS | Status: AC | PRN
  Administered 2018-04-17: 4 mL via INTRAVENOUS

## 2018-04-22 ENCOUNTER — Ambulatory Visit (INDEPENDENT_AMBULATORY_CARE_PROVIDER_SITE_OTHER): Payer: Medicare HMO | Admitting: Physician Assistant

## 2018-04-22 ENCOUNTER — Encounter (INDEPENDENT_AMBULATORY_CARE_PROVIDER_SITE_OTHER): Payer: Self-pay | Admitting: Physician Assistant

## 2018-04-22 DIAGNOSIS — D2120 Benign neoplasm of connective and other soft tissue of unspecified lower limb, including hip: Secondary | ICD-10-CM | POA: Diagnosis not present

## 2018-04-22 NOTE — Progress Notes (Signed)
Office Visit Note   Patient: Sheri Pope           Date of Birth: 04-01-1940           MRN: [id number] Visit Date: 04/22/2018              Requested by: Celene Squibb, MD [address] [place], [place] [address] PCP: Celene Squibb, MD   Assessment & Plan: Visit Diagnoses:  1. Fibroma of lower extremity, unspecified laterality     Plan: Reassurance is given these are non-malignant lesions.  Patient occasionally takes Aleve for this.  Spoke with her about natural anti-inflammatories such as turmeric she states that she will try this.  Follow-Up Instructions: Return if symptoms worsen or fail to improve.   Orders:  No orders of the defined types were placed in this encounter.  No orders of the defined types were placed in this encounter.     Procedures: No procedures performed   Clinical Data: No additional findings.   Subjective: Chief Complaint  Patient presents with  . Right Leg - Follow-up    Post MRI RLE    HPI Sheri Pope returns today for follow-up of her right lower leg status post MRI with contrast.  States she is no longer having calf pain or pain posterior aspect of the knee. MRI dated 04/17/2018 is reviewed with the patient actual images are reviewed with her.  Multiple soft tissue masses in the right lower leg are seen these are consistent with fibromas.  1 of the masses is 5 x 2 x 2 cm wrap around the anterior tibialis tendon.  There is another mass 3 x 2 x 1 cm surrounding the Achilles tendon.  Similar lesions are seen in the posterior aspect of the calcaneus and a fourth lesion was seen involving the right distal patellar tibial tendon which measured 10 x 8 x 7 mm.  Similar lesions are also seen in the left lower leg that was also imaged inadvertently.  No bony destruction noted.  No acute fractures right lower leg.  Review of Systems See HPI otherwise negative  Objective: Vital Signs: There were no vitals taken for this visit.  Physical  Exam General: Well-developed well-nourished female in no acute distress Ortho Exam Multiple masses right lower leg. Specialty Comments:  No specialty comments available.  Imaging: No results found.   PMFS History: Patient Active Problem List   Diagnosis Date Noted  . Trochanteric bursitis, left hip 12/19/2017  . Degenerative arthritis of left knee 11/10/2011   Past Medical History:  Diagnosis Date  . Arthritis    RA AND OA --PAIN AND SWELLIN IN LEFT KNEE  . Blood in urine    NEGATIVE UROLOGY WORK UP --BUT ALWAYS HAS BLOOD IN URINE  . GERD (gastroesophageal reflux disease)   . H/O hiatal hernia   . Hyperlipidemia   . PONV (postoperative nausea and vomiting)    ALWAYS SICK AFTER SURGERIES EXCEPT AFTER HIP REPLACMENT 2007  . Vitamin D deficiency     No family history on file.  Past Surgical History:  Procedure Laterality Date  . ABDOMINAL HYSTERECTOMY  1870'S  . BREAST SURGERY     BREAST BIOSPIES  . CATARACT EXTRACTION W/PHACO Left 05/30/2012   Procedure: CATARACT EXTRACTION PHACO AND INTRAOCULAR LENS PLACEMENT (IOC);  Surgeon: Tonny Branch, MD;  Location: [facility];  Service: Ophthalmology;  Laterality: Left;  CDE: 16.99  . CATARACT EXTRACTION W/PHACO Right 06/24/2012   Procedure: CATARACT EXTRACTION PHACO  AND INTRAOCULAR LENS PLACEMENT (IOC);  Surgeon: Tonny Branch, MD;  Location: [facility];  Service: Ophthalmology;  Laterality: Right;  CDE:17.92  . JOINT REPLACEMENT  2007   RIGHT HIP REPLACEMENT  . LEFT KNEE ARTHROSCOPY  AUG 2012  . MULTIPLE ORTHOPEDIC SURGERIES     ON BOTH HANDS AND BOTH FEET, RIGHT ELBOW  . SPLEENECTOMY  IN THE 70'S   FOR LARGE CYST  . TOTAL KNEE ARTHROPLASTY  11/10/2011   Procedure: TOTAL KNEE ARTHROPLASTY;  Surgeon: Mcarthur Rossetti, MD;  Location: [facility];  Service: Orthopedics;  Laterality: Left;  Left Total Knee Arthroplasty   Social History   Occupational History  . Not on file  Tobacco Use  . Smoking status: Current Some Day Smoker     Packs/day: 1.00    Years: 50.00    Pack years: 50.00    Types: Cigarettes  . Smokeless tobacco: Never Used  Substance and Sexual Activity  . Alcohol use: No  . Drug use: No  . Sexual activity: Yes    Birth control/protection: Surgical

## 2018-06-18 DIAGNOSIS — M25561 Pain in right knee: Secondary | ICD-10-CM | POA: Diagnosis not present

## 2018-06-18 DIAGNOSIS — R05 Cough: Secondary | ICD-10-CM | POA: Diagnosis not present

## 2018-06-18 DIAGNOSIS — E559 Vitamin D deficiency, unspecified: Secondary | ICD-10-CM | POA: Diagnosis not present

## 2018-06-18 DIAGNOSIS — Z0000 Encounter for general adult medical examination without abnormal findings: Secondary | ICD-10-CM | POA: Diagnosis not present

## 2018-06-18 DIAGNOSIS — M25569 Pain in unspecified knee: Secondary | ICD-10-CM | POA: Diagnosis not present

## 2018-06-18 DIAGNOSIS — R030 Elevated blood-pressure reading, without diagnosis of hypertension: Secondary | ICD-10-CM | POA: Diagnosis not present

## 2018-06-18 DIAGNOSIS — E785 Hyperlipidemia, unspecified: Secondary | ICD-10-CM | POA: Diagnosis not present

## 2018-06-18 DIAGNOSIS — M0579 Rheumatoid arthritis with rheumatoid factor of multiple sites without organ or systems involvement: Secondary | ICD-10-CM | POA: Diagnosis not present

## 2018-06-18 DIAGNOSIS — G9009 Other idiopathic peripheral autonomic neuropathy: Secondary | ICD-10-CM | POA: Diagnosis not present

## 2018-06-18 DIAGNOSIS — H33009 Unspecified retinal detachment with retinal break, unspecified eye: Secondary | ICD-10-CM | POA: Diagnosis not present

## 2018-06-20 DIAGNOSIS — Z0001 Encounter for general adult medical examination with abnormal findings: Secondary | ICD-10-CM | POA: Diagnosis not present

## 2018-06-24 DIAGNOSIS — D219 Benign neoplasm of connective and other soft tissue, unspecified: Secondary | ICD-10-CM | POA: Diagnosis not present

## 2018-06-24 DIAGNOSIS — M25569 Pain in unspecified knee: Secondary | ICD-10-CM | POA: Diagnosis not present

## 2018-06-24 DIAGNOSIS — K219 Gastro-esophageal reflux disease without esophagitis: Secondary | ICD-10-CM | POA: Diagnosis not present

## 2018-06-24 DIAGNOSIS — H33309 Unspecified retinal break, unspecified eye: Secondary | ICD-10-CM | POA: Diagnosis not present

## 2018-06-24 DIAGNOSIS — M069 Rheumatoid arthritis, unspecified: Secondary | ICD-10-CM | POA: Diagnosis not present

## 2018-06-24 DIAGNOSIS — E559 Vitamin D deficiency, unspecified: Secondary | ICD-10-CM | POA: Diagnosis not present

## 2018-06-24 DIAGNOSIS — F17208 Nicotine dependence, unspecified, with other nicotine-induced disorders: Secondary | ICD-10-CM | POA: Diagnosis not present

## 2018-06-24 DIAGNOSIS — R05 Cough: Secondary | ICD-10-CM | POA: Diagnosis not present

## 2018-06-24 DIAGNOSIS — G9009 Other idiopathic peripheral autonomic neuropathy: Secondary | ICD-10-CM | POA: Diagnosis not present

## 2018-10-21 ENCOUNTER — Other Ambulatory Visit: Payer: Self-pay

## 2018-10-21 ENCOUNTER — Ambulatory Visit (HOSPITAL_COMMUNITY)
Admission: RE | Admit: 2018-10-21 | Discharge: 2018-10-21 | Disposition: A | Discharge status: Home / Self Care | Payer: Medicare HMO | Source: Ambulatory Visit | Attending: Internal Medicine | Admitting: Internal Medicine

## 2018-10-21 ENCOUNTER — Other Ambulatory Visit (HOSPITAL_COMMUNITY): Payer: Self-pay | Admitting: Internal Medicine

## 2018-10-21 DIAGNOSIS — E559 Vitamin D deficiency, unspecified: Secondary | ICD-10-CM | POA: Diagnosis not present

## 2018-10-21 DIAGNOSIS — H33009 Unspecified retinal detachment with retinal break, unspecified eye: Secondary | ICD-10-CM | POA: Diagnosis not present

## 2018-10-21 DIAGNOSIS — F17208 Nicotine dependence, unspecified, with other nicotine-induced disorders: Secondary | ICD-10-CM | POA: Diagnosis not present

## 2018-10-21 DIAGNOSIS — Z0001 Encounter for general adult medical examination with abnormal findings: Secondary | ICD-10-CM | POA: Diagnosis not present

## 2018-10-21 DIAGNOSIS — R05 Cough: Secondary | ICD-10-CM | POA: Diagnosis not present

## 2018-10-21 DIAGNOSIS — M25561 Pain in right knee: Secondary | ICD-10-CM | POA: Diagnosis not present

## 2018-10-21 DIAGNOSIS — R0602 Shortness of breath: Secondary | ICD-10-CM

## 2018-10-21 DIAGNOSIS — M069 Rheumatoid arthritis, unspecified: Secondary | ICD-10-CM | POA: Diagnosis not present

## 2018-10-21 DIAGNOSIS — Z0000 Encounter for general adult medical examination without abnormal findings: Secondary | ICD-10-CM | POA: Diagnosis not present

## 2018-10-21 DIAGNOSIS — R030 Elevated blood-pressure reading, without diagnosis of hypertension: Secondary | ICD-10-CM | POA: Diagnosis not present

## 2018-10-21 DIAGNOSIS — F430 Acute stress reaction: Secondary | ICD-10-CM | POA: Diagnosis not present

## 2018-10-21 DIAGNOSIS — R079 Chest pain, unspecified: Secondary | ICD-10-CM | POA: Diagnosis not present

## 2018-10-21 DIAGNOSIS — R0789 Other chest pain: Secondary | ICD-10-CM | POA: Diagnosis not present

## 2018-10-21 DIAGNOSIS — D219 Benign neoplasm of connective and other soft tissue, unspecified: Secondary | ICD-10-CM | POA: Diagnosis not present

## 2018-10-21 DIAGNOSIS — M0579 Rheumatoid arthritis with rheumatoid factor of multiple sites without organ or systems involvement: Secondary | ICD-10-CM | POA: Diagnosis not present

## 2018-12-11 ENCOUNTER — Encounter (INDEPENDENT_AMBULATORY_CARE_PROVIDER_SITE_OTHER): Payer: Medicare HMO | Admitting: Ophthalmology

## 2018-12-17 NOTE — Progress Notes (Addendum)
[facility] Note  12/18/2018     CHIEF COMPLAINT Patient presents for Retina Evaluation   HISTORY OF PRESENT ILLNESS: Sheri Pope is a 78 y.o. female who presents to the clinic today for:   HPI    Retina Evaluation    In left eye.  This started 4 months ago.  Duration of 4 months.  Associated Symptoms Floaters.  Negative for Flashes, Blind Spot, Photophobia, Scalp Tenderness, Fever, Pain, Glare, Jaw Claudication, Weight Loss, Distortion, Redness, Trauma, Shoulder/Hip pain and Fatigue.  Context:  distance vision, mid-range vision, near vision and watching TV.  Treatments tried include no treatments.  I, the attending physician,  performed the HPI with the patient and updated documentation appropriately.          Comments    Patient referred by PCP for retinal evaluation. Patient c/o "camera shutter" in OS, intermittent for the past 4 months. Has floaters OU that have been there for years, no worsening of floaters. No flashes. No curtain/veil over vision. Has film over vision at times late afternoons or in the evenings. Refresh AT's help with film. Patient says that Dr. Noel Journey at [facility] told her that she had RD OD (2018), but no history of surgery or laser. Patient unsure if she actually had RD OD vs PVD.       Last edited by Bernarda Caffey, MD on 12/18/2018 10:49 AM. (History)    pt is a former pt of Dr. Noel Journey at [facility], her last appt with him was in November, pt states [facility] told her she had a retinal detachment in 2018, but he did not do sx - notes from [place] indicate pt had a vitreous detachment, pt is s/p CE IOL with Dr. Geoffry Paradise, pt denies being diabetic or hypertensive, but states she does not check her blood sugar, pts regular eye dr is Dr. Jorja Loa, she has an appt with him in January  Referring physician: Celene Squibb, MD [place],  [place] [address]  HISTORICAL INFORMATION:   Selected notes from the  MEDICAL RECORD NUMBER Referred by Dr. Allyn Kenner for DM exam LEE:  Ocular Hx- PMH-   CURRENT MEDICATIONS: Current Outpatient Medications (Ophthalmic Drugs)  Medication Sig  . Polyvinyl Alcohol-Povidone (REFRESH OP) Place 1 drop into both eyes 3 (three) times daily.   No current facility-administered medications for this visit.  (Ophthalmic Drugs)   Current Outpatient Medications (Other)  Medication Sig  . acetaminophen (TYLENOL) 500 MG tablet Take 500 mg by mouth every 6 (six) hours as needed. Pain  . alum & mag hydroxide-simeth (MAALOX/MYLANTA) 200-200-20 MG/5ML suspension Take 5 mLs by mouth every 6 (six) hours as needed. Acid reflux  . diclofenac sodium (VOLTAREN) 1 % GEL Apply 2 g topically 4 (four) times daily.  Marland Kitchen lactose free nutrition (BOOST) LIQD Take 1 Container by mouth daily.   No current facility-administered medications for this visit.  (Other)      REVIEW OF SYSTEMS: ROS    Positive for: Musculoskeletal, Eyes   Negative for: Constitutional, Gastrointestinal, Neurological, Skin, Genitourinary, HENT, Endocrine, Cardiovascular, Respiratory, Psychiatric, Allergic/Imm, Heme/Lymph   Last edited by Roselee Nova D, COT on 12/18/2018  9:33 AM. (History)       ALLERGIES Allergies  Allergen Reactions  . Codeine Nausea And Vomiting  . Prednisone Other (See Comments)    Thought she was having a heart attack  . Sulfa Antibiotics Other (See Comments)    unknown  .  Latex Rash    PAST MEDICAL HISTORY Past Medical History:  Diagnosis Date  . Arthritis    RA AND OA --PAIN AND SWELLIN IN LEFT KNEE  . Blood in urine    NEGATIVE UROLOGY WORK UP --BUT ALWAYS HAS BLOOD IN URINE  . GERD (gastroesophageal reflux disease)   . H/O hiatal hernia   . Hyperlipidemia   . PONV (postoperative nausea and vomiting)    ALWAYS SICK AFTER SURGERIES EXCEPT AFTER HIP REPLACMENT 2007  . Vitamin D deficiency    Past Surgical History:  Procedure Laterality Date  . ABDOMINAL HYSTERECTOMY   1870'S  . BREAST SURGERY     BREAST BIOSPIES  . CATARACT EXTRACTION W/PHACO Left 05/30/2012   Procedure: CATARACT EXTRACTION PHACO AND INTRAOCULAR LENS PLACEMENT (IOC);  Surgeon: Tonny Branch, MD;  Location: [facility];  Service: Ophthalmology;  Laterality: Left;  CDE: 16.99  . CATARACT EXTRACTION W/PHACO Right 06/24/2012   Procedure: CATARACT EXTRACTION PHACO AND INTRAOCULAR LENS PLACEMENT (IOC);  Surgeon: Tonny Branch, MD;  Location: [facility];  Service: Ophthalmology;  Laterality: Right;  CDE:17.92  . JOINT REPLACEMENT  2007   RIGHT HIP REPLACEMENT  . LEFT KNEE ARTHROSCOPY  AUG 2012  . MULTIPLE ORTHOPEDIC SURGERIES     ON BOTH HANDS AND BOTH FEET, RIGHT ELBOW  . SPLEENECTOMY  IN THE 70'S   FOR LARGE CYST  . TOTAL KNEE ARTHROPLASTY  11/10/2011   Procedure: TOTAL KNEE ARTHROPLASTY;  Surgeon: Mcarthur Rossetti, MD;  Location: [facility];  Service: Orthopedics;  Laterality: Left;  Left Total Knee Arthroplasty  . YAG LASER APPLICATION Bilateral    Dr. Geoffry Paradise    FAMILY HISTORY Family History  Problem Relation Age of Onset  . Diabetes Sister   . Macular degeneration Sister     SOCIAL HISTORY Social History   Tobacco Use  . Smoking status: Current Some Day Smoker    Packs/day: 1.00    Years: 50.00    Pack years: 50.00    Types: Cigarettes  . Smokeless tobacco: Never Used  Substance Use Topics  . Alcohol use: No  . Drug use: No         OPHTHALMIC EXAM:  Base Eye Exam    Visual Acuity (Snellen - Linear)      Right Left   Dist [place] 20/25 -1 20/25 -2   Dist ph [place] 20/20 -2 20/25 +1       Tonometry (Tonopen, 9:42 AM)      Right Left   Pressure 18 16       Pupils      Dark Light Shape React APD   Right 4 3 Round Brisk None   Left 4 3 Round Brisk None       Visual Fields (Counting fingers)      Left Right    Full Full       Extraocular Movement      Right Left    Full, Ortho Full, Ortho       Neuro/Psych    Oriented x3: Yes   Mood/Affect: Normal       Dilation     Both eyes: 1.0% Mydriacyl, 2.5% Phenylephrine @ 9:42 AM        Slit Lamp and Fundus Exam    Slit Lamp Exam      Right Left   Lids/Lashes Dermatochalasis - upper lid Dermatochalasis - upper lid   Conjunctiva/Sclera White and quiet White and quiet   Cornea 1+ Punctate epithelial erosions, well healed temporal  cataract wounds 1+ Punctate epithelial erosions, well healed temporal cataract wounds   Anterior Chamber Deep and quiet Deep and quiet   [place]ris Round and dilated Round and dilated   Lens Posterior chamber intraocular lens Posterior chamber intraocular lens   Vitreous Vitreous syneresis, Posterior vitreous detachment Vitreous syneresis, Posterior vitreous detachment       Fundus Exam      Right Left   Disc Pink and Sharp, Compact Pink and Sharp, Compact   C/D Ratio 0.2 0.1   Macula Blunted foveal reflex, Retinal pigment epithelial mottling, No heme or edema flat, Blunted foveal reflex, Retinal pigment epithelial mottling, No heme or edema   Vessels Mild Vascular attenuation, Tortuousity and AV crossing changes Mild Vascular attenuation, Tortuousity and AV crossing changes   Periphery Attached, no heme or edema Attached, no heme or edema        Refraction    Manifest Refraction      Sphere Cylinder Axis Dist VA   Right -0.50 +1.00 175 20/25+1   Left [place] +0.50 160 20/25-2          IMAGING AND PROCEDURES  Imaging and Procedures for @TODAY @  OCT, Retina - OU - Both Eyes       Right Eye Quality was good. Central Foveal Thickness: 241. Progression has no prior data. Findings include normal foveal contour, no IRF, no SRF.   Left Eye Quality was good. Central Foveal Thickness: 244. Progression has no prior data. Findings include normal foveal contour, no IRF, no SRF.   Notes *Images captured and stored on drive  Diagnosis / Impression:  NFP, no IRF/SRF OU  Clinical management:  See below  Abbreviations: NFP - Normal foveal profile. CME - cystoid macular edema. PED  - pigment epithelial detachment. IRF - intraretinal fluid. SRF - subretinal fluid. EZ - ellipsoid zone. ERM - epiretinal membrane. ORA - outer retinal atrophy. ORT - outer retinal tubulation. SRHM - subretinal hyper-reflective material                 ASSESSMENT/PLAN:    ICD-10-CM   1. Posterior vitreous detachment of both eyes  H43.813   2. Retinal edema  H35.81 OCT, Retina - OU - Both Eyes  3. Pseudophakia of both eyes  Z96.1     1. PVD / vitreous syneresis OU  - Discussed findings and prognosis  - No RT or RD on 360 scleral depressed exam  - Reviewed s/s of RT/RD  - Strict return precautions for any such RT/RD signs/symptoms  2. No retinal edema on exam or OCT  3. Pseudophakia OU  - s/p CE/IOL (Dr. Geoffry Paradise, 2014)  - IOLs in good position  - monitor  **Clear from a retina standpoint to resume primary eye care with Dr. Jorja Loa**   Ophthalmic Meds Ordered this visit:  No orders of the defined types were placed in this encounter.      Return if symptoms worsen or fail to improve.  There are no Patient Instructions on file for this visit.   Explained the diagnoses, plan, and follow up with the patient and they expressed understanding.  Patient expressed understanding of the importance of proper follow up care.   This document serves as a record of services personally performed by Gardiner Sleeper, MD, PhD. It was created on their behalf by Ernest Mallick, OA, an ophthalmic assistant. The creation of this record is the provider's dictation and/or activities during the visit.    Electronically signed by: Ernest Mallick, OA 10.20.2020  9:34 PM   Gardiner Sleeper, M.D., Ph.D. Diseases & Surgery of the Retina and Vitreous [facility]  I have reviewed the above documentation for accuracy and completeness, and I agree with the above. Gardiner Sleeper, M.D., Ph.D. 12/18/18 9:34 PM    Abbreviations: M myopia (nearsighted); A astigmatism; H hyperopia  (farsighted); P presbyopia; Mrx spectacle prescription;  CTL contact lenses; OD right eye; OS left eye; OU both eyes  XT exotropia; ET esotropia; PEK punctate epithelial keratitis; PEE punctate epithelial erosions; DES dry eye syndrome; MGD meibomian gland dysfunction; ATs artificial tears; PFAT's preservative free artificial tears; [place] nuclear sclerotic cataract; PSC posterior subcapsular cataract; ERM epi-retinal membrane; PVD posterior vitreous detachment; RD retinal detachment; DM diabetes mellitus; DR diabetic retinopathy; NPDR non-proliferative diabetic retinopathy; PDR proliferative diabetic retinopathy; CSME clinically significant macular edema; DME diabetic macular edema; dbh dot blot hemorrhages; CWS cotton wool spot; POAG primary open angle glaucoma; C/D cup-to-disc ratio; HVF humphrey visual field; GVF goldmann visual field; OCT optical coherence tomography; IOP intraocular pressure; BRVO Branch retinal vein occlusion; CRVO central retinal vein occlusion; CRAO central retinal artery occlusion; BRAO branch retinal artery occlusion; RT retinal tear; SB scleral buckle; PPV pars plana vitrectomy; VH Vitreous hemorrhage; PRP panretinal laser photocoagulation; IVK intravitreal kenalog; VMT vitreomacular traction; MH Macular hole;  NVD neovascularization of the disc; NVE neovascularization elsewhere; AREDS age related eye disease study; ARMD age related macular degeneration; POAG primary open angle glaucoma; EBMD epithelial/anterior basement membrane dystrophy; ACIOL anterior chamber intraocular lens; IOL intraocular lens; PCIOL posterior chamber intraocular lens; Phaco/IOL phacoemulsification with intraocular lens placement; [place] photorefractive keratectomy; LASIK laser assisted in situ keratomileusis; HTN hypertension; DM diabetes mellitus; COPD chronic obstructive pulmonary disease

## 2018-12-18 ENCOUNTER — Encounter (INDEPENDENT_AMBULATORY_CARE_PROVIDER_SITE_OTHER): Payer: Self-pay | Admitting: Ophthalmology

## 2018-12-18 ENCOUNTER — Ambulatory Visit (INDEPENDENT_AMBULATORY_CARE_PROVIDER_SITE_OTHER): Payer: Medicare HMO | Admitting: Ophthalmology

## 2018-12-18 ENCOUNTER — Other Ambulatory Visit: Payer: Self-pay

## 2018-12-18 DIAGNOSIS — H43813 Vitreous degeneration, bilateral: Secondary | ICD-10-CM

## 2018-12-18 DIAGNOSIS — H3581 Retinal edema: Secondary | ICD-10-CM

## 2018-12-18 DIAGNOSIS — Z961 Presence of intraocular lens: Secondary | ICD-10-CM | POA: Diagnosis not present

## 2019-03-01 ENCOUNTER — Ambulatory Visit
Admission: EM | Admit: 2019-03-01 | Discharge: 2019-03-01 | Disposition: A | Discharge status: Home / Self Care | Payer: Medicare HMO | Attending: Emergency Medicine | Admitting: Emergency Medicine

## 2019-03-01 ENCOUNTER — Other Ambulatory Visit: Payer: Self-pay

## 2019-03-01 DIAGNOSIS — R42 Dizziness and giddiness: Secondary | ICD-10-CM | POA: Insufficient documentation

## 2019-03-01 DIAGNOSIS — R350 Frequency of micturition: Secondary | ICD-10-CM | POA: Diagnosis not present

## 2019-03-01 LAB — POCT URINALYSIS DIP (MANUAL ENTRY)
Bilirubin, UA: NEGATIVE
Glucose, UA: NEGATIVE mg/dL
Ketones, POC UA: NEGATIVE mg/dL
Leukocytes, UA: NEGATIVE
Nitrite, UA: NEGATIVE
Protein Ur, POC: NEGATIVE mg/dL
Spec Grav, UA: <=1.005 — AB (ref 1.010–1.025)
Urobilinogen, UA: 0.2 E.U./dL
pH, UA: 5.5 (ref 5.0–8.0)

## 2019-03-01 MED ORDER — MECLIZINE HCL 25 MG PO TABS: 25.0000 mg | ORAL_TABLET | Freq: Three times a day (TID) | ORAL | 0 refills | Status: DC | PRN

## 2019-03-01 NOTE — ED Provider Notes (Signed)
[facility]    CSN: [id number] Arrival date & time: 03/01/19  1135      History   Chief Complaint Chief Complaint  Patient presents with  . Dizziness    HPI Sheri Pope is a 79 y.o. female.   Patient complains of dizziness, chills and frequent urination for the past few days.  Denies a precipitating event, trauma, or recent URI within the past month.  Describes the dizziness as the room spinning. States that it is  intermittent with episodes lasting  1 hour.  Has tried dramanmine without relief.  Symptoms made worse with moving.  Admits to previous symptoms that improved with.  Denies fever, chills, nausea, vomiting, hearing changes, tinnitus, ear pain, chest pain, syncope, SOB, weakness, slurred speech, memory or emotional changes, facial drooping, incoordination, numbness or tingling, abdominal pain, changes in bowel or bladder habits.   The history is provided by the patient. No language interpreter was used.  Dizziness   Past Medical History:  Diagnosis Date  . Arthritis    RA AND OA --PAIN AND SWELLIN IN LEFT KNEE  . Blood in urine    NEGATIVE UROLOGY WORK UP --BUT ALWAYS HAS BLOOD IN URINE  . GERD (gastroesophageal reflux disease)   . H/O hiatal hernia   . Hyperlipidemia   . PONV (postoperative nausea and vomiting)    ALWAYS SICK AFTER SURGERIES EXCEPT AFTER HIP REPLACMENT 2007  . Vitamin D deficiency     Patient Active Problem List   Diagnosis Date Noted  . Trochanteric bursitis, left hip 12/19/2017  . Degenerative arthritis of left knee 11/10/2011    Past Surgical History:  Procedure Laterality Date  . ABDOMINAL HYSTERECTOMY  1870'S  . BREAST SURGERY     BREAST BIOSPIES  . CATARACT EXTRACTION W/PHACO Left 05/30/2012   Procedure: CATARACT EXTRACTION PHACO AND INTRAOCULAR LENS PLACEMENT (IOC);  Surgeon: Tonny Branch, MD;  Location: [facility];  Service: Ophthalmology;  Laterality: Left;  CDE: 16.99  . CATARACT EXTRACTION W/PHACO Right 06/24/2012   Procedure: CATARACT EXTRACTION PHACO AND INTRAOCULAR LENS PLACEMENT (IOC);  Surgeon: Tonny Branch, MD;  Location: [facility];  Service: Ophthalmology;  Laterality: Right;  CDE:17.92  . JOINT REPLACEMENT  2007   RIGHT HIP REPLACEMENT  . LEFT KNEE ARTHROSCOPY  AUG 2012  . MULTIPLE ORTHOPEDIC SURGERIES     ON BOTH HANDS AND BOTH FEET, RIGHT ELBOW  . SPLEENECTOMY  IN THE 70'S   FOR LARGE CYST  . TOTAL KNEE ARTHROPLASTY  11/10/2011   Procedure: TOTAL KNEE ARTHROPLASTY;  Surgeon: Mcarthur Rossetti, MD;  Location: [facility];  Service: Orthopedics;  Laterality: Left;  Left Total Knee Arthroplasty  . YAG LASER APPLICATION Bilateral    Dr. Geoffry Paradise    OB History   No obstetric history on file.      Home Medications    Prior to Admission medications   Medication Sig Start Date End Date Taking? Authorizing Provider  acetaminophen (TYLENOL) 500 MG tablet Take 500 mg by mouth every 6 (six) hours as needed. Pain    [provider]  alum & mag hydroxide-simeth (MAALOX/MYLANTA) 200-200-20 MG/5ML suspension Take 5 mLs by mouth every 6 (six) hours as needed. Acid reflux    [provider]  diclofenac sodium (VOLTAREN) 1 % GEL Apply 2 g topically 4 (four) times daily. 12/19/17   Mcarthur Rossetti, MD  lactose free nutrition (BOOST) LIQD Take 1 Container by mouth daily.    [provider]  meclizine (ANTIVERT) 25 MG  tablet Take 1 tablet (25 mg total) by mouth 3 (three) times daily as needed for dizziness. 03/01/19   Tinzley Dalia, Darrelyn Hillock, FNP  Polyvinyl Alcohol-Povidone (REFRESH OP) Place 1 drop into both eyes 3 (three) times daily.    [provider]    Family History Family History  Problem Relation Age of Onset  . Diabetes Sister   . Macular degeneration Sister   . Healthy Mother     Social History Social History   Tobacco Use  . Smoking status: Current Some Day Smoker    Packs/day: 1.00    Years: 50.00    Pack years: 50.00    Types: Cigarettes  . Smokeless  tobacco: Never Used  Substance Use Topics  . Alcohol use: No  . Drug use: No     Allergies   Codeine, Prednisone, Sulfa antibiotics, and Latex   Review of Systems Review of Systems  Constitutional: Negative.   Respiratory: Negative.   Cardiovascular: Negative.   Gastrointestinal: Negative.   Genitourinary: Positive for frequency.  Neurological: Positive for dizziness.     Physical Exam Triage Vital Signs ED Triage Vitals  Enc Vitals Group     BP 03/01/19 1158 (!) 160/81     Pulse Rate 03/01/19 1158 85     Resp 03/01/19 1158 16     Temp 03/01/19 1158 98 F (36.7 C)     Temp Source 03/01/19 1158 Oral     SpO2 03/01/19 1158 97 %     Weight --      Height --      Head Circumference --      Peak Flow --      Pain Score 03/01/19 1217 0     Pain Loc --      Pain Edu? --      Excl. in [place]? --    No data found.  Updated Vital Signs BP (!) 160/81 (BP Location: Right Arm)   Pulse 85   Temp 98 F (36.7 C) (Oral)   Resp 16   SpO2 97%   Visual Acuity Right Eye Distance:   Left Eye Distance:   Bilateral Distance:    Right Eye Near:   Left Eye Near:    Bilateral Near:     Physical Exam Vitals and nursing note reviewed.  Constitutional:      General: She is not in acute distress.    Appearance: Normal appearance. She is normal weight. She is not ill-appearing or toxic-appearing.  HENT:     Head: Normocephalic.     Right Ear: Tympanic membrane, ear canal and external ear normal. There is no impacted cerumen.     Left Ear: Tympanic membrane, ear canal and external ear normal. There is no impacted cerumen.     Nose: Nose normal. No congestion.     Mouth/Throat:     Mouth: Mucous membranes are moist.     Pharynx: No oropharyngeal exudate or posterior oropharyngeal erythema.  Cardiovascular:     Rate and Rhythm: Normal rate and regular rhythm.     Pulses: Normal pulses.     Heart sounds: Normal heart sounds. No murmur.  Pulmonary:     Effort: Pulmonary effort  is normal. No respiratory distress.     Breath sounds: No wheezing or rhonchi.  Chest:     Chest wall: No tenderness.  Abdominal:     General: Abdomen is flat. Bowel sounds are normal. There is no distension.     Palpations: There is no  mass.  Skin:    Capillary Refill: Capillary refill takes less than 2 seconds.  Neurological:     General: No focal deficit present.     Mental Status: She is alert and oriented to person, place, and time.     Cranial Nerves: No cranial nerve deficit.     Sensory: Sensation is intact. No sensory deficit.     Coordination: Coordination is intact. Coordination normal.     Gait: Gait is intact.      UC Treatments / Results  Labs (all labs ordered are listed, but only abnormal results are displayed) Labs Reviewed  POCT URINALYSIS DIP (MANUAL ENTRY) - Abnormal; Notable for the following components:      Result Value   Spec Grav, UA <=1.005 (*)    Blood, UA small (*)    All other components within normal limits  URINE CULTURE    EKG   Radiology No results found.  Procedures Procedures (including critical care time)  Medications Ordered in UC Medications - No data to display  Initial Impression / Assessment and Plan / UC Course  I have reviewed the triage vital signs and the nursing notes.  Pertinent labs & imaging results that were available during my care of the patient were reviewed by me and considered in my medical decision making (see chart for details).   Urine analysis was ordered and result was reviewed.  Results show trace of blood.  Urine was sent for culture.  Patient is stable for discharge.  Advised patient to take meclizine as prescribed.  Patient was informed of adverse reaction.  Advised patient to follow-up with primary care.  Patient verbalized an understanding of the plan of care f Final Clinical Impressions(s) / UC Diagnoses   Final diagnoses:  Dizziness  Urine frequency     Discharge Instructions     UA showed  trace of blood.  Urine was sent for culture. will call patient if result is abnormal. Advised patient to take medication as prescribed Meclizine was prescribed Patient was made aware medication may make her sleepy and drowsy. Advised patient to stop taking Dramamine. Follow-up with primary care To return for worsening of symptoms    ED Prescriptions    Medication Sig Dispense Auth. Provider   meclizine (ANTIVERT) 25 MG tablet Take 1 tablet (25 mg total) by mouth 3 (three) times daily as needed for dizziness. 30 tablet Maxwel Meadowcroft, Darrelyn Hillock, FNP     PDMP not reviewed this encounter.   Emerson Monte, FNP 03/01/19 1257

## 2019-03-01 NOTE — ED Triage Notes (Addendum)
Pt presents to UC w/ c/o dizziness, trouble with walking due to dizziness, cold chills, frequency with urination. Hx of vertigo Pt states she has had some nausea w/ vertigo and states the vertigo became worse last night. Pt states she took a dramamine this morning which helped w/ some symptom relief.

## 2019-03-01 NOTE — Discharge Instructions (Addendum)
UA showed trace of blood.  Urine was sent for culture. will call patient if result is abnormal. Advised patient to take medication as prescribed Meclizine was prescribed Patient was made aware medication may make her sleepy and drowsy. Advised patient to stop taking Dramamine. Follow-up with primary care To return for worsening of symptoms

## 2019-03-02 LAB — URINE CULTURE: Culture: <10000 — AB

## 2019-03-11 DIAGNOSIS — R0789 Other chest pain: Secondary | ICD-10-CM | POA: Diagnosis not present

## 2019-03-11 DIAGNOSIS — D219 Benign neoplasm of connective and other soft tissue, unspecified: Secondary | ICD-10-CM | POA: Diagnosis not present

## 2019-03-11 DIAGNOSIS — M069 Rheumatoid arthritis, unspecified: Secondary | ICD-10-CM | POA: Diagnosis not present

## 2019-03-11 DIAGNOSIS — F430 Acute stress reaction: Secondary | ICD-10-CM | POA: Diagnosis not present

## 2019-03-11 DIAGNOSIS — R0602 Shortness of breath: Secondary | ICD-10-CM | POA: Diagnosis not present

## 2019-06-03 DIAGNOSIS — H43813 Vitreous degeneration, bilateral: Secondary | ICD-10-CM | POA: Diagnosis not present

## 2019-06-19 DIAGNOSIS — K219 Gastro-esophageal reflux disease without esophagitis: Secondary | ICD-10-CM | POA: Diagnosis not present

## 2019-06-19 DIAGNOSIS — R0789 Other chest pain: Secondary | ICD-10-CM | POA: Diagnosis not present

## 2019-06-19 DIAGNOSIS — D219 Benign neoplasm of connective and other soft tissue, unspecified: Secondary | ICD-10-CM | POA: Diagnosis not present

## 2019-06-19 DIAGNOSIS — M069 Rheumatoid arthritis, unspecified: Secondary | ICD-10-CM | POA: Diagnosis not present

## 2019-06-19 DIAGNOSIS — R0602 Shortness of breath: Secondary | ICD-10-CM | POA: Diagnosis not present

## 2019-06-19 DIAGNOSIS — F430 Acute stress reaction: Secondary | ICD-10-CM | POA: Diagnosis not present

## 2019-09-30 DIAGNOSIS — R0789 Other chest pain: Secondary | ICD-10-CM | POA: Diagnosis not present

## 2019-09-30 DIAGNOSIS — F430 Acute stress reaction: Secondary | ICD-10-CM | POA: Diagnosis not present

## 2019-09-30 DIAGNOSIS — D219 Benign neoplasm of connective and other soft tissue, unspecified: Secondary | ICD-10-CM | POA: Diagnosis not present

## 2019-09-30 DIAGNOSIS — R0602 Shortness of breath: Secondary | ICD-10-CM | POA: Diagnosis not present

## 2019-09-30 DIAGNOSIS — E559 Vitamin D deficiency, unspecified: Secondary | ICD-10-CM | POA: Diagnosis not present

## 2019-09-30 DIAGNOSIS — M069 Rheumatoid arthritis, unspecified: Secondary | ICD-10-CM | POA: Diagnosis not present

## 2019-10-13 DIAGNOSIS — Z0000 Encounter for general adult medical examination without abnormal findings: Secondary | ICD-10-CM | POA: Diagnosis not present

## 2019-10-13 DIAGNOSIS — Z0001 Encounter for general adult medical examination with abnormal findings: Secondary | ICD-10-CM | POA: Diagnosis not present

## 2019-10-13 DIAGNOSIS — R0602 Shortness of breath: Secondary | ICD-10-CM | POA: Diagnosis not present

## 2019-10-13 DIAGNOSIS — R05 Cough: Secondary | ICD-10-CM | POA: Diagnosis not present

## 2019-10-13 DIAGNOSIS — R0789 Other chest pain: Secondary | ICD-10-CM | POA: Diagnosis not present

## 2019-10-13 DIAGNOSIS — F430 Acute stress reaction: Secondary | ICD-10-CM | POA: Diagnosis not present

## 2019-10-13 DIAGNOSIS — M25561 Pain in right knee: Secondary | ICD-10-CM | POA: Diagnosis not present

## 2019-10-13 DIAGNOSIS — M0579 Rheumatoid arthritis with rheumatoid factor of multiple sites without organ or systems involvement: Secondary | ICD-10-CM | POA: Diagnosis not present

## 2019-10-13 DIAGNOSIS — H33009 Unspecified retinal detachment with retinal break, unspecified eye: Secondary | ICD-10-CM | POA: Diagnosis not present

## 2019-10-16 DIAGNOSIS — K219 Gastro-esophageal reflux disease without esophagitis: Secondary | ICD-10-CM | POA: Diagnosis not present

## 2019-10-16 DIAGNOSIS — R0789 Other chest pain: Secondary | ICD-10-CM | POA: Diagnosis not present

## 2019-10-16 DIAGNOSIS — M25569 Pain in unspecified knee: Secondary | ICD-10-CM | POA: Diagnosis not present

## 2019-10-16 DIAGNOSIS — M069 Rheumatoid arthritis, unspecified: Secondary | ICD-10-CM | POA: Diagnosis not present

## 2019-10-16 DIAGNOSIS — Z0001 Encounter for general adult medical examination with abnormal findings: Secondary | ICD-10-CM | POA: Diagnosis not present

## 2019-10-16 DIAGNOSIS — G9009 Other idiopathic peripheral autonomic neuropathy: Secondary | ICD-10-CM | POA: Diagnosis not present

## 2019-10-16 DIAGNOSIS — D219 Benign neoplasm of connective and other soft tissue, unspecified: Secondary | ICD-10-CM | POA: Diagnosis not present

## 2019-10-16 DIAGNOSIS — E559 Vitamin D deficiency, unspecified: Secondary | ICD-10-CM | POA: Diagnosis not present

## 2019-10-16 DIAGNOSIS — R0602 Shortness of breath: Secondary | ICD-10-CM | POA: Diagnosis not present

## 2019-10-16 DIAGNOSIS — R030 Elevated blood-pressure reading, without diagnosis of hypertension: Secondary | ICD-10-CM | POA: Diagnosis not present

## 2019-10-16 DIAGNOSIS — H33309 Unspecified retinal break, unspecified eye: Secondary | ICD-10-CM | POA: Diagnosis not present

## 2019-10-16 DIAGNOSIS — F17208 Nicotine dependence, unspecified, with other nicotine-induced disorders: Secondary | ICD-10-CM | POA: Diagnosis not present

## 2019-10-16 DIAGNOSIS — F430 Acute stress reaction: Secondary | ICD-10-CM | POA: Diagnosis not present

## 2019-10-17 ENCOUNTER — Encounter (HOSPITAL_COMMUNITY): Payer: Self-pay

## 2019-10-17 NOTE — Progress Notes (Signed)
I placed an introductory phone call to this patient. I introduced myself and explained my role in the patient's care. I briefly explained what the patient can expect during her initial visit with Dr. Delton Coombes. The patient denies any known barriers to care at this time. The patient was given the opportunity to ask questions and all were answered to her satisfaction. I provided my contact information and encouraged the patient to reach out with any questions or concerns.

## 2019-10-21 ENCOUNTER — Inpatient Hospital Stay (HOSPITAL_COMMUNITY): Payer: Medicare HMO

## 2019-10-21 ENCOUNTER — Other Ambulatory Visit (HOSPITAL_COMMUNITY): Payer: Self-pay

## 2019-10-21 ENCOUNTER — Other Ambulatory Visit: Payer: Self-pay

## 2019-10-21 ENCOUNTER — Encounter (HOSPITAL_COMMUNITY): Payer: Self-pay

## 2019-10-21 ENCOUNTER — Encounter (HOSPITAL_COMMUNITY): Payer: Self-pay | Admitting: Hematology

## 2019-10-21 ENCOUNTER — Inpatient Hospital Stay (HOSPITAL_COMMUNITY): Payer: Medicare HMO | Attending: Hematology | Admitting: Hematology

## 2019-10-21 DIAGNOSIS — D72820 Lymphocytosis (symptomatic): Secondary | ICD-10-CM

## 2019-10-21 DIAGNOSIS — F17210 Nicotine dependence, cigarettes, uncomplicated: Secondary | ICD-10-CM | POA: Insufficient documentation

## 2019-10-21 DIAGNOSIS — R110 Nausea: Secondary | ICD-10-CM | POA: Diagnosis not present

## 2019-10-21 DIAGNOSIS — D709 Neutropenia, unspecified: Secondary | ICD-10-CM | POA: Diagnosis not present

## 2019-10-21 DIAGNOSIS — E559 Vitamin D deficiency, unspecified: Secondary | ICD-10-CM | POA: Diagnosis not present

## 2019-10-21 DIAGNOSIS — E785 Hyperlipidemia, unspecified: Secondary | ICD-10-CM | POA: Insufficient documentation

## 2019-10-21 DIAGNOSIS — K219 Gastro-esophageal reflux disease without esophagitis: Secondary | ICD-10-CM | POA: Insufficient documentation

## 2019-10-21 DIAGNOSIS — M069 Rheumatoid arthritis, unspecified: Secondary | ICD-10-CM | POA: Insufficient documentation

## 2019-10-21 DIAGNOSIS — D72829 Elevated white blood cell count, unspecified: Secondary | ICD-10-CM | POA: Insufficient documentation

## 2019-10-21 DIAGNOSIS — Z79899 Other long term (current) drug therapy: Secondary | ICD-10-CM | POA: Diagnosis not present

## 2019-10-21 DIAGNOSIS — R634 Abnormal weight loss: Secondary | ICD-10-CM | POA: Diagnosis not present

## 2019-10-21 DIAGNOSIS — M129 Arthropathy, unspecified: Secondary | ICD-10-CM | POA: Insufficient documentation

## 2019-10-21 DIAGNOSIS — R197 Diarrhea, unspecified: Secondary | ICD-10-CM | POA: Insufficient documentation

## 2019-10-21 LAB — RETICULOCYTES
Immature Retic Fract: 12.3 % (ref 2.3–15.9)
RBC.: 4.32 MIL/uL (ref 3.87–5.11)
Retic Count, Absolute: 67.8 10*3/uL (ref 19.0–186.0)
Retic Ct Pct: 1.6 % (ref 0.4–3.1)

## 2019-10-21 LAB — CBC WITH DIFFERENTIAL/PLATELET
Abs Immature Granulocytes: 0.01 10*3/uL (ref 0.00–0.07)
Basophils Absolute: 0.1 10*3/uL (ref 0.0–0.1)
Basophils Relative: 1 %
Eosinophils Absolute: 0 10*3/uL (ref 0.0–0.5)
Eosinophils Relative: 0 %
HCT: 42.7 % (ref 36.0–46.0)
Hemoglobin: 14.2 g/dL (ref 12.0–15.0)
Immature Granulocytes: 0 %
Lymphocytes Relative: 94 %
Lymphs Abs: 12.7 10*3/uL — ABNORMAL HIGH (ref 0.7–4.0)
MCH: 33.2 pg (ref 26.0–34.0)
MCHC: 33.3 g/dL (ref 30.0–36.0)
MCV: 99.8 fL (ref 80.0–100.0)
Monocytes Absolute: 0.3 10*3/uL (ref 0.1–1.0)
Monocytes Relative: 2 %
Neutro Abs: 0.4 10*3/uL — ABNORMAL LOW (ref 1.7–7.7)
Neutrophils Relative %: 3 %
Platelets: 285 10*3/uL (ref 150–400)
RBC: 4.28 MIL/uL (ref 3.87–5.11)
RDW: 16.6 % — ABNORMAL HIGH (ref 11.5–15.5)
WBC Morphology: ABNORMAL
WBC: 13.5 10*3/uL — ABNORMAL HIGH (ref 4.0–10.5)
nRBC: 0 % (ref 0.0–0.2)

## 2019-10-21 LAB — FOLATE: Folate: 26.4 ng/mL (ref >5.9)

## 2019-10-21 LAB — URIC ACID: Uric Acid, Serum: 3.5 mg/dL (ref 2.5–7.1)

## 2019-10-21 LAB — VITAMIN B12: Vitamin B-12: 333 pg/mL (ref 180–914)

## 2019-10-21 LAB — LACTATE DEHYDROGENASE: LDH: 176 U/L (ref 98–192)

## 2019-10-21 NOTE — Patient Instructions (Signed)
[place] at [facility] Discharge Instructions  You were seen and examined today by Dr. Delton Coombes. Dr. Delton Coombes is a medical oncologist and hematologist which means that he specializes is cancer and blood disorders. Dr. Delton Coombes discussed your past medical history, family history of cancer and current functional status.  We will check your lab work today. We will also schedule you for a CT of your chest, abdomen and pelvis. This will help to confirm a diagnosis and formulate a treatment plan.  We will see you back in about 2 weeks.  Thank you for choosing [place] at [facility] to provide your oncology and hematology care.  To afford each patient quality time with our provider, please arrive at least 15 minutes before your scheduled appointment time.   If you have a lab appointment with the [facility] please come in thru the Main Entrance and check in at the main information desk.  You need to re-schedule your appointment should you arrive 10 or more minutes late.  We strive to give you quality time with our providers, and arriving late affects you and other patients whose appointments are after yours.  Also, if you no show three or more times for appointments you may be dismissed from the clinic at the providers discretion.     Again, thank you for choosing [facility].  Our hope is that these requests will decrease the amount of time that you wait before being seen by our physicians.       _____________________________________________________________  Should you have questions after your visit to [facility], please contact our office at [phone] and follow the prompts.  Our office hours are 8:00 a.m. and 4:30 p.m. Monday - Friday.  Please note that voicemails left after 4:00 p.m. may not be returned until the following business day.  We are closed weekends and major holidays.  You do have access to a nurse  24-7, just call the main number to the clinic [phone] and do not press any options, hold on the line and a nurse will answer the phone.    For prescription refill requests, have your pharmacy contact our office and allow 72 hours.    Due to Covid, you will need to wear a mask upon entering the hospital. If you do not have a mask, a mask will be given to you at the Main Entrance upon arrival. For doctor visits, patients may have 1 support person age 55 or older with them. For treatment visits, patients can not have anyone with them due to social distancing guidelines and our immunocompromised population.

## 2019-10-21 NOTE — Progress Notes (Signed)
[facility] [place], [place] [address]   CLINIC:  Medical Oncology/Hematology  Patient Care Team: Celene Squibb, MD as PCP - General (Internal Medicine) Dishmon, Garwin Brothers, RN as Oncology Nurse Navigator (Oncology)  CHIEF COMPLAINTS/PURPOSE OF CONSULTATION:  Evaluation of leukocytosis  HISTORY OF PRESENTING ILLNESS:  Sheri Pope 79 y.o. female is here because of evaluation of leukocytosis, at the request of Dr. Wende Neighbors. On 8/16 her WBC was 13.5 with ANC 0.3 and absolute lymphocyte counts at 12.7 along with presence of smudge cells.  Today she is accompanied by her niece, Chong Sicilian. She reports that she was informed about her leukocytosis last week. She denies any recent infections, F/C, or night sweats, though she reports losing about 15 lbs and worsening of her appetite in the last 5-6 weeks. She complains of being nauseous for the past week as well as diarrhea for the past 4 days. She uses Aspercreme, hot compresses, and Voltaren gel for her rheumatoid and osteoarthritis, as well as fibromas; she reports that her pain level in her joints went from 10 to 30 over the past month. She drinks 1 Boost daily. She denies any history of MI's or CVA's.  She lives at home with her nephew. She has a walker and a cane at home, but she is able to ambulate without assistance. She used to work at [employer] in [employer]. She continues smoking 1 PPD for over 50 years. Her brother had some kind of retrocardiac cancer after serving in the [employer]. She is not vaccinated for COVID.   MEDICAL HISTORY:  Past Medical History:  Diagnosis Date  . Arthritis    RA AND OA --PAIN AND SWELLIN IN LEFT KNEE  . Blood in urine    NEGATIVE UROLOGY WORK UP --BUT ALWAYS HAS BLOOD IN URINE  . GERD (gastroesophageal reflux disease)   . H/O hiatal hernia   . Hyperlipidemia   . PONV (postoperative nausea and vomiting)    ALWAYS SICK AFTER SURGERIES EXCEPT AFTER HIP REPLACMENT 2007  .  Vitamin D deficiency     SURGICAL HISTORY: Past Surgical History:  Procedure Laterality Date  . ABDOMINAL HYSTERECTOMY  1870'S  . BREAST SURGERY     BREAST BIOSPIES  . CATARACT EXTRACTION W/PHACO Left 05/30/2012   Procedure: CATARACT EXTRACTION PHACO AND INTRAOCULAR LENS PLACEMENT (IOC);  Surgeon: Tonny Branch, MD;  Location: [facility];  Service: Ophthalmology;  Laterality: Left;  CDE: 16.99  . CATARACT EXTRACTION W/PHACO Right 06/24/2012   Procedure: CATARACT EXTRACTION PHACO AND INTRAOCULAR LENS PLACEMENT (IOC);  Surgeon: Tonny Branch, MD;  Location: [facility];  Service: Ophthalmology;  Laterality: Right;  CDE:17.92  . HIP SURGERY    . JOINT REPLACEMENT  2007   RIGHT HIP REPLACEMENT  . LEFT KNEE ARTHROSCOPY  AUG 2012  . MULTIPLE ORTHOPEDIC SURGERIES     ON BOTH HANDS AND BOTH FEET, RIGHT ELBOW  . SPLEENECTOMY  IN THE 70'S   FOR LARGE CYST  . TOTAL KNEE ARTHROPLASTY  11/10/2011   Procedure: TOTAL KNEE ARTHROPLASTY;  Surgeon: Mcarthur Rossetti, MD;  Location: [facility];  Service: Orthopedics;  Laterality: Left;  Left Total Knee Arthroplasty  . YAG LASER APPLICATION Bilateral    Dr. Geoffry Paradise    SOCIAL HISTORY: Social History   Socioeconomic History  . Marital status: Widowed    Spouse name: Not on file  . Number of children: Not on file  . Years of education: Not on file  . Highest  education level: Not on file  Occupational History  . Occupation: retired  Tobacco Use  . Smoking status: Current Every Day Smoker    Packs/day: 1.00    Years: 50.00    Pack years: 50.00    Types: Cigarettes  . Smokeless tobacco: Never Used  Substance and Sexual Activity  . Alcohol use: No  . Drug use: No  . Sexual activity: Not Currently    Birth control/protection: Surgical  Other Topics Concern  . Not on file  Social History Narrative  . Not on file   Social Determinants of Health   Financial Resource Strain:   . Difficulty of Paying Living Expenses: Not on file  Food Insecurity:   . Worried  About Charity fundraiser in the Last Year: Not on file  . Ran Out of Food in the Last Year: Not on file  Transportation Needs:   . Lack of Transportation (Medical): Not on file  . Lack of Transportation (Non-Medical): Not on file  Physical Activity:   . Days of Exercise per Week: Not on file  . Minutes of Exercise per Session: Not on file  Stress:   . Feeling of Stress : Not on file  Social Connections:   . Frequency of Communication with Friends and Family: Not on file  . Frequency of Social Gatherings with Friends and Family: Not on file  . Attends Religious Services: Not on file  . Active Member of Clubs or Organizations: Not on file  . Attends Archivist Meetings: Not on file  . Marital Status: Not on file  Intimate Partner Violence:   . Fear of Current or Ex-Partner: Not on file  . Emotionally Abused: Not on file  . Physically Abused: Not on file  . Sexually Abused: Not on file    FAMILY HISTORY: Family History  Problem Relation Age of Onset  . Diabetes Sister   . Macular degeneration Sister   . Heart attack Mother   . Stroke Father   . Dementia Father   . Cancer Brother   . Healthy Sister   . Healthy Sister   . Diabetes Brother     ALLERGIES:  is allergic to codeine, prednisone, sulfa antibiotics, aleve [naproxen], and latex.  MEDICATIONS:  Current Outpatient Medications  Medication Sig Dispense Refill  . acetaminophen (TYLENOL) 500 MG tablet Take 500 mg by mouth every 6 (six) hours as needed. Pain    . ALPRAZolam (XANAX) 0.25 MG tablet Take 0.25 mg by mouth every 8 (eight) hours as needed.    Marland Kitchen alum & mag hydroxide-simeth (MAALOX/MYLANTA) 200-200-20 MG/5ML suspension Take 5 mLs by mouth every 6 (six) hours as needed. Acid reflux    . diclofenac sodium (VOLTAREN) 1 % GEL Apply 2 g topically 4 (four) times daily. 100 g 3  . DULoxetine (CYMBALTA) 30 MG capsule Take 30 mg by mouth daily.    Marland Kitchen lactose free nutrition (BOOST) LIQD Take 1 Container by mouth  daily.    . Polyvinyl Alcohol-Povidone (REFRESH OP) Place 1 drop into both eyes 3 (three) times daily.    . meclizine (ANTIVERT) 25 MG tablet Take 1 tablet (25 mg total) by mouth 3 (three) times daily as needed for dizziness. (Patient not taking: Reported on 10/21/2019) 30 tablet 0   No current facility-administered medications for this visit.    REVIEW OF SYSTEMS:   Review of Systems  Constitutional: Positive for appetite change (depleted), fatigue (depleted) and unexpected weight change (15 lbs in 5-6 weeks).  Negative for chills, diaphoresis and fever.  Gastrointestinal: Positive for diarrhea and nausea.  Genitourinary: Positive for frequency.   Musculoskeletal: Positive for arthralgias (9/10 shoulder and joint pain).  Neurological: Positive for headaches and numbness (L hand).  All other systems reviewed and are negative.    PHYSICAL EXAMINATION: ECOG PERFORMANCE STATUS: 1 - Symptomatic but completely ambulatory  Vitals:   10/21/19 0802 10/21/19 0838  BP:  (!) 183/72  Pulse: 94   Resp: 18   Temp: (!) 97.1 F (36.2 C)   SpO2: 99%    Filed Weights   10/21/19 0802  Weight: 110 lb 3.2 oz (50 kg)   Physical Exam Vitals reviewed.  Constitutional:      Appearance: Normal appearance.  Cardiovascular:     Rate and Rhythm: Normal rate and regular rhythm.     Pulses: Normal pulses.     Heart sounds: Normal heart sounds.  Pulmonary:     Effort: Pulmonary effort is normal.     Breath sounds: Normal breath sounds.  Abdominal:     Palpations: Abdomen is soft. There is no hepatomegaly or mass.     Tenderness: There is no abdominal tenderness.     Hernia: No hernia is present.  Musculoskeletal:     Right elbow: Swelling (rheumatoid nodules) present.     Left elbow: Swelling (rheumatoid nodules) present.     Right hand: Deformity (rheumatoid nodules on all fingers and knuckles d/t RA and OA) present.     Left hand: Deformity (rheumatoid nodules on all fingers and knuckles d/t RA  and OA) present.     Right lower leg: Swelling (fibrous swelling above lower anterior shin bone) present. No edema.     Left lower leg: Swelling (fibrous swelling above lower anterior shin bone) present. No edema.  Lymphadenopathy:     Cervical: No cervical adenopathy.     Upper Body:     Right upper body: No supraclavicular or axillary adenopathy.     Left upper body: No supraclavicular or axillary adenopathy.     Lower Body: No right inguinal adenopathy. No left inguinal adenopathy.  Neurological:     General: No focal deficit present.     Mental Status: She is alert and oriented to person, place, and time.  Psychiatric:        Mood and Affect: Mood normal.        Behavior: Behavior normal.      LABORATORY DATA:  I have reviewed the data as listed No results found for this or any previous visit (from the past 2160 hour(s)).  RADIOGRAPHIC STUDIES: I have personally reviewed the radiological images as listed and agreed with the findings in the report.  ASSESSMENT:  1.  Lymphocytic leukocytosis: -CBC on 10-13-2019 shows white count 13.5, with differential showing 2% neutrophils, 94% lymphocytes, 3% monocytes and 1% basophils. -Hemoglobin and platelet count were normal. -She had history of splenectomy at age 53 secondary to a cyst on it. -She reported 5 to 10 pound weight loss in the last 6 months but denies any fevers or night sweats. -Reports decreased appetite and decreased energy levels for the last 5 to 6 weeks.  2.  Social/family history: -She lives at home with her nephew.  She did office work prior to retirement. -She is a current active smoker, 1 pack/day for 50+ years. -Brother had cancer, type unknown.  3.  Rheumatoid arthritis: -She has rheumatoid arthritis of the extremities, predominantly in the upper extremities with rheumatoid nodules. -Uses Voltaren and  aspirin cream.  PLAN:  1.  Lymphocytic leukocytosis: -We reviewed her blood work.  We talked about the  differential diagnosis including CLL. -We'll check her CBC today along with LDH, reticulocyte count and uric acid. -We will send her blood for flow cytometry for immunophenotyping. -We'll see her back in 2 to 3 weeks to discuss results.  2.  Weight loss: -She has a 50+ pack year smoking history and weight loss of 5 to 10 pounds in the last 4 to 6 months. -I have recommended a CT CAP to further evaluate this.   All questions were answered. The patient knows to call the clinic with any problems, questions or concerns.  Derek Jack, MD 10/21/19 8:45 AM  [place] [phone]   I, Milinda Antis, am acting as a scribe for Dr. Sanda Linger.  I, Derek Jack MD, have reviewed the above documentation for accuracy and completeness, and I agree with the above.

## 2019-10-21 NOTE — Progress Notes (Signed)
At appt, patient's BP was 183/72, patient called to report a much improved BP of 134/78 following appt. Dr. Delton Coombes aware. No action needed per Dr. Delton Coombes.

## 2019-10-22 LAB — PATHOLOGIST SMEAR REVIEW

## 2019-10-22 LAB — SURGICAL PATHOLOGY

## 2019-10-24 LAB — CD19 AND CD20, FLOW CYTOMETRY

## 2019-10-27 LAB — COMP PANEL: LEUKEMIA/LYMPHOMA: Immunophenotypic Profile: 70

## 2019-10-31 LAB — FLOW CYTOMETRY

## 2019-11-06 ENCOUNTER — Other Ambulatory Visit: Payer: Self-pay

## 2019-11-06 ENCOUNTER — Ambulatory Visit (HOSPITAL_COMMUNITY)
Admission: RE | Admit: 2019-11-06 | Discharge: 2019-11-06 | Disposition: A | Discharge status: Home / Self Care | Payer: Medicare HMO | Source: Ambulatory Visit | Attending: Hematology | Admitting: Hematology

## 2019-11-06 DIAGNOSIS — R634 Abnormal weight loss: Secondary | ICD-10-CM | POA: Insufficient documentation

## 2019-11-06 DIAGNOSIS — D72820 Lymphocytosis (symptomatic): Secondary | ICD-10-CM | POA: Diagnosis not present

## 2019-11-06 DIAGNOSIS — K5730 Diverticulosis of large intestine without perforation or abscess without bleeding: Secondary | ICD-10-CM | POA: Insufficient documentation

## 2019-11-06 DIAGNOSIS — N200 Calculus of kidney: Secondary | ICD-10-CM | POA: Insufficient documentation

## 2019-11-06 DIAGNOSIS — D709 Neutropenia, unspecified: Secondary | ICD-10-CM

## 2019-11-06 DIAGNOSIS — I700 Atherosclerosis of aorta: Secondary | ICD-10-CM | POA: Diagnosis not present

## 2019-11-06 DIAGNOSIS — J439 Emphysema, unspecified: Secondary | ICD-10-CM | POA: Diagnosis not present

## 2019-11-06 DIAGNOSIS — M5136 Other intervertebral disc degeneration, lumbar region: Secondary | ICD-10-CM | POA: Diagnosis not present

## 2019-11-06 DIAGNOSIS — M5137 Other intervertebral disc degeneration, lumbosacral region: Secondary | ICD-10-CM | POA: Diagnosis not present

## 2019-11-06 DIAGNOSIS — Z87891 Personal history of nicotine dependence: Secondary | ICD-10-CM | POA: Insufficient documentation

## 2019-11-06 DIAGNOSIS — R590 Localized enlarged lymph nodes: Secondary | ICD-10-CM | POA: Diagnosis not present

## 2019-11-06 DIAGNOSIS — I2510 Atherosclerotic heart disease of native coronary artery without angina pectoris: Secondary | ICD-10-CM | POA: Diagnosis not present

## 2019-11-06 LAB — POCT I-STAT CREATININE: Creatinine, Ser: 0.6 mg/dL (ref 0.44–1.00)

## 2019-11-06 MED ORDER — IOHEXOL 300 MG/ML  SOLN
100.0000 mL | Freq: Once | INTRAMUSCULAR | Status: AC | PRN
  Administered 2019-11-06: 100 mL via INTRAVENOUS

## 2019-11-11 ENCOUNTER — Other Ambulatory Visit: Payer: Self-pay

## 2019-11-11 ENCOUNTER — Inpatient Hospital Stay (HOSPITAL_COMMUNITY): Payer: Medicare HMO

## 2019-11-11 ENCOUNTER — Other Ambulatory Visit (HOSPITAL_COMMUNITY): Payer: Self-pay

## 2019-11-11 ENCOUNTER — Inpatient Hospital Stay (HOSPITAL_COMMUNITY): Payer: Medicare HMO | Attending: Hematology | Admitting: Hematology

## 2019-11-11 VITALS — BP 153/82 | HR 99 | Temp 97.3°F | Resp 18 | Wt 110.6 lb

## 2019-11-11 DIAGNOSIS — E785 Hyperlipidemia, unspecified: Secondary | ICD-10-CM | POA: Insufficient documentation

## 2019-11-11 DIAGNOSIS — N200 Calculus of kidney: Secondary | ICD-10-CM | POA: Diagnosis not present

## 2019-11-11 DIAGNOSIS — M129 Arthropathy, unspecified: Secondary | ICD-10-CM | POA: Diagnosis not present

## 2019-11-11 DIAGNOSIS — I700 Atherosclerosis of aorta: Secondary | ICD-10-CM | POA: Insufficient documentation

## 2019-11-11 DIAGNOSIS — F17210 Nicotine dependence, cigarettes, uncomplicated: Secondary | ICD-10-CM | POA: Diagnosis not present

## 2019-11-11 DIAGNOSIS — D709 Neutropenia, unspecified: Secondary | ICD-10-CM

## 2019-11-11 DIAGNOSIS — R634 Abnormal weight loss: Secondary | ICD-10-CM | POA: Insufficient documentation

## 2019-11-11 DIAGNOSIS — R590 Localized enlarged lymph nodes: Secondary | ICD-10-CM | POA: Insufficient documentation

## 2019-11-11 DIAGNOSIS — D72829 Elevated white blood cell count, unspecified: Secondary | ICD-10-CM | POA: Diagnosis not present

## 2019-11-11 DIAGNOSIS — Z79899 Other long term (current) drug therapy: Secondary | ICD-10-CM | POA: Diagnosis not present

## 2019-11-11 DIAGNOSIS — Z809 Family history of malignant neoplasm, unspecified: Secondary | ICD-10-CM | POA: Insufficient documentation

## 2019-11-11 DIAGNOSIS — M069 Rheumatoid arthritis, unspecified: Secondary | ICD-10-CM | POA: Insufficient documentation

## 2019-11-11 DIAGNOSIS — J439 Emphysema, unspecified: Secondary | ICD-10-CM | POA: Diagnosis not present

## 2019-11-11 DIAGNOSIS — D72820 Lymphocytosis (symptomatic): Secondary | ICD-10-CM | POA: Diagnosis not present

## 2019-11-11 DIAGNOSIS — Z801 Family history of malignant neoplasm of trachea, bronchus and lung: Secondary | ICD-10-CM | POA: Insufficient documentation

## 2019-11-11 DIAGNOSIS — K219 Gastro-esophageal reflux disease without esophagitis: Secondary | ICD-10-CM | POA: Diagnosis not present

## 2019-11-11 NOTE — Progress Notes (Signed)
Sheri Pope, [place] [address]   CLINIC:  Medical Oncology/Hematology  PCP:  Celene Squibb, MD [address] [place] [address]  [phone]  REASON FOR VISIT:  Follow-up for lymphocytosis and neutropenia  PRIOR THERAPY: Splenectomy   CURRENT THERAPY: Observation  INTERVAL HISTORY:  [name], a 79 y.o. female, returns for routine follow-up for her lymphocytosis and neutropenia. Tamee was last seen on 10/21/2019.  Today she is accompanied by her niece. She reports that her arthritis flared up on Saturday and she takes Tylenol and Aspercreme for her arthritis. She denies having recurrent infections, F/C, night sweats or weight loss.  She reports that her nephew is a major source of her stress in her life and is wondering if new living arrangements can be made.   REVIEW OF SYSTEMS:  Review of Systems  Constitutional: Positive for appetite change (moderately decreased) and fatigue (moderate). Negative for chills, diaphoresis, fever and unexpected weight change.  Gastrointestinal: Positive for diarrhea and nausea.  Musculoskeletal: Positive for arthralgias (6/10 arthritis pain).  All other systems reviewed and are negative.   PAST MEDICAL/SURGICAL HISTORY:  Past Medical History:  Diagnosis Date  . Arthritis    RA AND OA --PAIN AND SWELLIN IN LEFT KNEE  . Blood in urine    NEGATIVE UROLOGY WORK UP --BUT ALWAYS HAS BLOOD IN URINE  . GERD (gastroesophageal reflux disease)   . H/O hiatal hernia   . Hyperlipidemia   . PONV (postoperative nausea and vomiting)    ALWAYS SICK AFTER SURGERIES EXCEPT AFTER HIP REPLACMENT 2007  . Vitamin D deficiency    Past Surgical History:  Procedure Laterality Date  . ABDOMINAL HYSTERECTOMY  1870'S  . BREAST SURGERY     BREAST BIOSPIES  . CATARACT EXTRACTION W/PHACO Left 05/30/2012   Procedure: CATARACT EXTRACTION PHACO AND INTRAOCULAR LENS PLACEMENT (IOC);  Surgeon: Tonny Branch, MD;   Location: [facility];  Service: Ophthalmology;  Laterality: Left;  CDE: 16.99  . CATARACT EXTRACTION W/PHACO Right 06/24/2012   Procedure: CATARACT EXTRACTION PHACO AND INTRAOCULAR LENS PLACEMENT (IOC);  Surgeon: Tonny Branch, MD;  Location: [facility];  Service: Ophthalmology;  Laterality: Right;  CDE:17.92  . HIP SURGERY    . JOINT REPLACEMENT  2007   RIGHT HIP REPLACEMENT  . LEFT KNEE ARTHROSCOPY  AUG 2012  . MULTIPLE ORTHOPEDIC SURGERIES     ON BOTH HANDS AND BOTH FEET, RIGHT ELBOW  . SPLEENECTOMY  IN THE 70'S   FOR LARGE CYST  . TOTAL KNEE ARTHROPLASTY  11/10/2011   Procedure: TOTAL KNEE ARTHROPLASTY;  Surgeon: Mcarthur Rossetti, MD;  Location: [facility];  Service: Orthopedics;  Laterality: Left;  Left Total Knee Arthroplasty  . YAG LASER APPLICATION Bilateral    Dr. Geoffry Paradise    SOCIAL HISTORY:  Social History   Socioeconomic History  . Marital status: Widowed    Spouse name: Not on file  . Number of children: Not on file  . Years of education: Not on file  . Highest education level: Not on file  Occupational History  . Occupation: retired  Tobacco Use  . Smoking status: Current Every Day Smoker    Packs/day: 1.00    Years: 50.00    Pack years: 50.00    Types: Cigarettes  . Smokeless tobacco: Never Used  Substance and Sexual Activity  . Alcohol use: No  . Drug use: No  . Sexual activity: Not Currently    Birth control/protection:  Surgical  Other Topics Concern  . Not on file  Social History Narrative  . Not on file   Social Determinants of Health   Financial Resource Strain:   . Difficulty of Paying Living Expenses: Not on file  Food Insecurity:   . Worried About Charity fundraiser in the Last Year: Not on file  . Ran Out of Food in the Last Year: Not on file  Transportation Needs:   . Lack of Transportation (Medical): Not on file  . Lack of Transportation (Non-Medical): Not on file  Physical Activity:   . Days of Exercise per Week: Not on file  . Minutes of Exercise  per Session: Not on file  Stress:   . Feeling of Stress : Not on file  Social Connections:   . Frequency of Communication with Friends and Family: Not on file  . Frequency of Social Gatherings with Friends and Family: Not on file  . Attends Religious Services: Not on file  . Active Member of Clubs or Organizations: Not on file  . Attends Archivist Meetings: Not on file  . Marital Status: Not on file  Intimate Partner Violence:   . Fear of Current or Ex-Partner: Not on file  . Emotionally Abused: Not on file  . Physically Abused: Not on file  . Sexually Abused: Not on file    FAMILY HISTORY:  Family History  Problem Relation Age of Onset  . Diabetes Sister   . Macular degeneration Sister   . Heart attack Mother   . Stroke Father   . Dementia Father   . Cancer Brother   . Healthy Sister   . Healthy Sister   . Diabetes Brother     CURRENT MEDICATIONS:  Current Outpatient Medications  Medication Sig Dispense Refill  . acetaminophen (TYLENOL) 500 MG tablet Take 500 mg by mouth every 6 (six) hours as needed. Pain    . diclofenac sodium (VOLTAREN) 1 % GEL Apply 2 g topically 4 (four) times daily. 100 g 3  . DULoxetine (CYMBALTA) 30 MG capsule Take 30 mg by mouth daily.    Marland Kitchen lactose free nutrition (BOOST) LIQD Take 1 Container by mouth daily.    . meclizine (ANTIVERT) 25 MG tablet Take 1 tablet (25 mg total) by mouth 3 (three) times daily as needed for dizziness. 30 tablet 0  . Polyvinyl Alcohol-Povidone (REFRESH OP) Place 1 drop into both eyes 3 (three) times daily.    Marland Kitchen alum & mag hydroxide-simeth (MAALOX/MYLANTA) 200-200-20 MG/5ML suspension Take 5 mLs by mouth every 6 (six) hours as needed. Acid reflux (Patient not taking: Reported on 11/11/2019)     No current facility-administered medications for this visit.    ALLERGIES:  Allergies  Allergen Reactions  . Codeine Nausea And Vomiting  . Prednisone Other (See Comments)    Thought she was having a heart attack    . Sulfa Antibiotics Other (See Comments)    unknown  . Aleve [Naproxen] Anxiety    Jittery and hot flashes  . Latex Rash    PHYSICAL EXAM:  Performance status (ECOG): 1 - Symptomatic but completely ambulatory  Vitals:   11/11/19 1026  BP: (!) 153/82  Pulse: 99  Resp: 18  Temp: (!) 97.3 F (36.3 C)  SpO2: 95%   Wt Readings from Last 3 Encounters:  11/11/19 110 lb 9.6 oz (50.2 kg)  10/21/19 110 lb 3.2 oz (50 kg)  03/07/17 110 lb (49.9 kg)   Physical Exam Vitals reviewed.  Constitutional:      Appearance: Normal appearance.  Cardiovascular:     Rate and Rhythm: Normal rate and regular rhythm.     Pulses: Normal pulses.     Heart sounds: Normal heart sounds.  Pulmonary:     Effort: Pulmonary effort is normal.     Breath sounds: Normal breath sounds.  Musculoskeletal:     Right hand: Swelling (& deformities of MCP & IP joints) present.     Left hand: Swelling (& deformities of MCP & IP joints) present.  Lymphadenopathy:     Upper Body:     Right upper body: No axillary or pectoral adenopathy.     Left upper body: No axillary or pectoral adenopathy.  Neurological:     General: No focal deficit present.     Mental Status: She is alert and oriented to person, place, and time.  Psychiatric:        Mood and Affect: Mood normal.        Behavior: Behavior normal.     LABORATORY DATA:  I have reviewed the labs as listed.  CBC Latest Ref Rng & Units 10/21/2019 12/11/2016 05/28/2012  WBC 4.0 - 10.5 K/uL 13.5(H) 7.4 -  Hemoglobin 12.0 - 15.0 g/dL 14.2 13.2 14.0  Hematocrit 36 - 46 % 42.7 39.8 40.6  Platelets 150 - 400 K/uL 285 363 -   CMP Latest Ref Rng & Units 11/06/2019 04/17/2018 12/11/2016  Glucose 65 - 99 mg/dL - - 96  BUN 6 - 20 mg/dL - - 11  Creatinine 0.44 - 1.00 mg/dL 0.60 0.70 0.76  Sodium 135 - 145 mmol/L - - 134(L)  Potassium 3.5 - 5.1 mmol/L - - 4.0  Chloride 101 - 111 mmol/L - - 101  CO2 22 - 32 mmol/L - - 25  Calcium 8.9 - 10.3 mg/dL - - 9.0  Total  Protein 6.5 - 8.1 g/dL - - 7.6  Total Bilirubin 0.3 - 1.2 mg/dL - - 0.4  Alkaline Phos 38 - 126 U/L - - 95  AST 15 - 41 U/L - - 13(L)  ALT 14 - 54 U/L - - 10(L)      Component Value Date/Time   RBC 4.28 10/21/2019 0915   RBC 4.32 10/21/2019 0915   MCV 99.8 10/21/2019 0915   MCH 33.2 10/21/2019 0915   MCHC 33.3 10/21/2019 0915   RDW 16.6 (H) 10/21/2019 0915   LYMPHSABS 12.7 (H) 10/21/2019 0915   MONOABS 0.3 10/21/2019 0915   EOSABS 0.0 10/21/2019 0915   BASOSABS 0.1 10/21/2019 0915    DIAGNOSTIC IMAGING:  I have independently reviewed the scans and discussed with the patient. CT CHEST ABDOMEN PELVIS W CONTRAST  Result Date: 11/06/2019 CLINICAL DATA:  Weight loss. Fifty pack-year history of smoking. Lymphocytosis. EXAM: CT CHEST, ABDOMEN, AND PELVIS WITH CONTRAST TECHNIQUE: Multidetector CT imaging of the chest, abdomen and pelvis was performed following the standard protocol during bolus administration of intravenous contrast. CONTRAST:  159mL OMNIPAQUE IOHEXOL 300 MG/ML  SOLN COMPARISON:  Multiple exams, including chest radiograph 10/21/2018 and report from chest CT dated 12/01/2004 FINDINGS: CT CHEST FINDINGS Cardiovascular: Coronary, aortic arch, and branch vessel atherosclerotic vascular disease. Mediastinum/Nodes: Mild diffuse prominence of the thyroid gland without discrete nodule. This can be seen in a variety of conditions including thyroiditis and Graves disease. Because no discrete thyroid nodule is identified, best practice recommendations for thyroid nodule are not issued in this case. Prominence of the thyroid gland has been noted on multiple prior studies including the  CT chest from 12/01/2004. Right axillary lymph nodes measure up to 1.4 cm in short axis (image 10 series 2). There is a suggestion of borderline prominence of left axillary lymph nodes which are not completely included on imaging on today's exam. Small bilateral subpectoral lymph nodes are present including a right  subpectoral lymph node measuring 0.8 cm in short axis on image 10/2. No hilar or mediastinal adenopathy observed. Lungs/Pleura: Centrilobular emphysema. Scattered linear scarring in both lungs without significant nodularity. Musculoskeletal: Lower thoracic spondylosis. CT ABDOMEN PELVIS FINDINGS Hepatobiliary: Linear punctate calcification along the posterior capsule of the lateral segment left hepatic lobe as on image 59/2. Hypodense lesion in segment 4 of the liver measuring 1.0 by 0.9 cm on image 61/2, technically nonspecific. However, this lesion was noted on the prior CT from 2006 in the report, measuring 0.8 cm, and hence is highly likely to be benign. Contracted gallbladder.  No additional significant liver lesion. Pancreas: Unremarkable Spleen: Splenectomy with regenerative splenic tissue in the left upper quadrant. Adrenals/Urinary Tract: 0.5 cm left kidney upper pole nonobstructive renal calculus. Separate punctate left kidney upper pole calculus could represent another 1-2 mm calculus. There also vascular calcifications in the left renal hilum. The bladder and right distal ureter are obscured by streak artifact from the patient's right hip implant. Adrenal glands normal. Stomach/Bowel: Sigmoid colon diverticulosis without findings of active diverticulitis. Vascular/Lymphatic: Aortoiliac atherosclerotic vascular disease. Left common iliac node 0.7 cm in short axis on image 82/2. Right external iliac node 0.7 cm in short axis, image 100/2. No overtly pathologic adenopathy in the abdomen. Reproductive: Uterus absent.  Adnexa unremarkable. Other: No supplemental non-categorized findings. Musculoskeletal: Right hip prosthesis. Degenerative disc disease and intervertebral spurring at L5-S1. 3 mm of grade 1 anterolisthesis at L4-5 with disc bulge and facet arthropathy. IMPRESSION: 1. Borderline prominent right axillary lymph node (1.4 cm in short axis) with upper normal sized left axillary and right subpectoral  lymph nodes. No overtly pathologic adenopathy. 2. Other imaging findings of potential clinical significance: Coronary atherosclerosis. Mild chronic diffuse prominence of the thyroid gland without discrete nodule. Sigmoid colon diverticulosis. Nonobstructive left nephrolithiasis. Degenerative disc disease and intervertebral spurring at L5-S1. 3. Emphysema and aortic atherosclerosis. Aortic Atherosclerosis (ICD10-I70.0) and Emphysema (ICD10-J43.9). Electronically Signed   By: Van Clines M.D.   On: 11/06/2019 14:34     ASSESSMENT:  1.  Lymphocytic leukocytosis: -CBC on 10-13-2019 shows white count 13.5, with differential showing 2% neutrophils, 94% lymphocytes, 3% monocytes and 1% basophils. -Hemoglobin and platelet count were normal. -She had history of splenectomy at age 31 secondary to a cyst on it. -She reported 5 to 10 pound weight loss in the last 6 months but denies any fevers or night sweats. -Reports decreased appetite and decreased energy levels for the last 5 to 6 weeks. -CT CAP on 11/06/2019 showed a borderline prominent right axillary lymph node, 1.4 cm right upper normal-sized left axillary and right subpectoral lymph nodes with no overtly pathologic adenopathy.  Mild chronic diffuse prominence of thyroid gland without discrete nodule.  2.  Social/family history: -She lives at home with her nephew.  She did office work prior to retirement. -She is a current active smoker, 1 pack/day for 50+ years. -Brother had cancer, type unknown.  3.  Rheumatoid arthritis: -She has rheumatoid arthritis of the extremities, predominantly in the upper extremities with rheumatoid nodules. -Uses Voltaren and aspirin cream.   PLAN:  1.  Lymphocytic leukocytosis: -I reviewed CBC from 10/21/2019, white count 13.5.  Lymphocytes are  elevated at 94%. -Flow cytometry showed no evidence of B-cell lymphoproliferative disorder.  Markedly increased T-cell large granular lymphocytes present. -T-cell LGL  can be associated with rheumatoid arthritis.  She does not have any B symptoms at this time.  Lymph nodes are barely palpable in the axillary region.  No cytopenias on CBC. -I have recommended checking PCR for T-cell gene rearrangement. -Even if T-cell LGL is confirmed, no indication for treatment at this time. -I plan to see her back in 3 to 4 months for follow-up with repeat labs. -She was told to come back sooner should she develop any recurrent infections, fevers, night sweats, painful lymphadenopathy or cytopenias.  2.  Weight loss: -She has a 50+ pack year smoking history. -She lost about 5 to 10 pounds in the last 4 to 6 months. -I discussed results of CT CAP which did not show any evidence of active malignancy. -She is reportedly also dealing with a lot of stress at home which could explain the weight loss.  Orders placed this encounter:  No orders of the defined types were placed in this encounter.    Derek Jack, MD [place] [phone]   I, Milinda Antis, am acting as a scribe for Dr. Sanda Linger.  I, Derek Jack MD, have reviewed the above documentation for accuracy and completeness, and I agree with the above.

## 2019-11-11 NOTE — Patient Instructions (Signed)
[place] at [facility] Discharge Instructions  You were seen today by Dr. Delton Coombes. He went over your recent results and scans. You had labs drawn for further analysis of your T-cell large granular lymphocytes (LGL). Your stressful situation at home is aggravating your arthritis and new living arrangements must be made. Dr. Delton Coombes will see you back in 4 months for labs and follow up.   Thank you for choosing [place] at [facility] to provide your oncology and hematology care.  To afford each patient quality time with our provider, please arrive at least 15 minutes before your scheduled appointment time.   If you have a lab appointment with the [facility] please come in thru the Main Entrance and check in at the main information desk  You need to re-schedule your appointment should you arrive 10 or more minutes late.  We strive to give you quality time with our providers, and arriving late affects you and other patients whose appointments are after yours.  Also, if you no show three or more times for appointments you may be dismissed from the clinic at the providers discretion.     Again, thank you for choosing [facility].  Our hope is that these requests will decrease the amount of time that you wait before being seen by our physicians.       _____________________________________________________________  Should you have questions after your visit to [facility], please contact our office at [phone] between the hours of 8:00 a.m. and 4:30 p.m.  Voicemails left after 4:00 p.m. will not be returned until the following business day.  For prescription refill requests, have your pharmacy contact our office and allow 72 hours.    Cancer Center Support Programs:   > Cancer Support Group  2nd Tuesday of the month 1pm-2pm, Journey Room

## 2019-11-14 LAB — MISC LABCORP TEST (SEND OUT): LabCorp test name: 481080

## 2019-11-27 DIAGNOSIS — Z0001 Encounter for general adult medical examination with abnormal findings: Secondary | ICD-10-CM | POA: Diagnosis not present

## 2019-11-27 DIAGNOSIS — I700 Atherosclerosis of aorta: Secondary | ICD-10-CM | POA: Diagnosis not present

## 2019-11-27 DIAGNOSIS — K219 Gastro-esophageal reflux disease without esophagitis: Secondary | ICD-10-CM | POA: Diagnosis not present

## 2019-11-27 DIAGNOSIS — F430 Acute stress reaction: Secondary | ICD-10-CM | POA: Diagnosis not present

## 2019-11-27 DIAGNOSIS — M069 Rheumatoid arthritis, unspecified: Secondary | ICD-10-CM | POA: Diagnosis not present

## 2019-11-27 DIAGNOSIS — R0789 Other chest pain: Secondary | ICD-10-CM | POA: Diagnosis not present

## 2019-11-27 DIAGNOSIS — D219 Benign neoplasm of connective and other soft tissue, unspecified: Secondary | ICD-10-CM | POA: Diagnosis not present

## 2019-11-27 DIAGNOSIS — E559 Vitamin D deficiency, unspecified: Secondary | ICD-10-CM | POA: Diagnosis not present

## 2019-11-27 DIAGNOSIS — M0579 Rheumatoid arthritis with rheumatoid factor of multiple sites without organ or systems involvement: Secondary | ICD-10-CM | POA: Diagnosis not present

## 2019-12-01 DIAGNOSIS — G9009 Other idiopathic peripheral autonomic neuropathy: Secondary | ICD-10-CM | POA: Diagnosis not present

## 2019-12-01 DIAGNOSIS — M069 Rheumatoid arthritis, unspecified: Secondary | ICD-10-CM | POA: Diagnosis not present

## 2019-12-01 DIAGNOSIS — E559 Vitamin D deficiency, unspecified: Secondary | ICD-10-CM | POA: Diagnosis not present

## 2019-12-01 DIAGNOSIS — R030 Elevated blood-pressure reading, without diagnosis of hypertension: Secondary | ICD-10-CM | POA: Diagnosis not present

## 2019-12-01 DIAGNOSIS — F17208 Nicotine dependence, unspecified, with other nicotine-induced disorders: Secondary | ICD-10-CM | POA: Diagnosis not present

## 2019-12-01 DIAGNOSIS — M0579 Rheumatoid arthritis with rheumatoid factor of multiple sites without organ or systems involvement: Secondary | ICD-10-CM | POA: Diagnosis not present

## 2019-12-01 DIAGNOSIS — D219 Benign neoplasm of connective and other soft tissue, unspecified: Secondary | ICD-10-CM | POA: Diagnosis not present

## 2019-12-01 DIAGNOSIS — J449 Chronic obstructive pulmonary disease, unspecified: Secondary | ICD-10-CM | POA: Diagnosis not present

## 2019-12-01 DIAGNOSIS — K219 Gastro-esophageal reflux disease without esophagitis: Secondary | ICD-10-CM | POA: Diagnosis not present

## 2019-12-01 DIAGNOSIS — H33309 Unspecified retinal break, unspecified eye: Secondary | ICD-10-CM | POA: Diagnosis not present

## 2019-12-01 DIAGNOSIS — M25569 Pain in unspecified knee: Secondary | ICD-10-CM | POA: Diagnosis not present

## 2020-02-04 DIAGNOSIS — H6692 Otitis media, unspecified, left ear: Secondary | ICD-10-CM | POA: Diagnosis not present

## 2020-02-04 DIAGNOSIS — M0579 Rheumatoid arthritis with rheumatoid factor of multiple sites without organ or systems involvement: Secondary | ICD-10-CM | POA: Diagnosis not present

## 2020-02-04 DIAGNOSIS — J0100 Acute maxillary sinusitis, unspecified: Secondary | ICD-10-CM | POA: Diagnosis not present

## 2020-02-23 DIAGNOSIS — L89152 Pressure ulcer of sacral region, stage 2: Secondary | ICD-10-CM | POA: Diagnosis not present

## 2020-03-03 ENCOUNTER — Other Ambulatory Visit: Payer: Self-pay

## 2020-03-03 ENCOUNTER — Inpatient Hospital Stay (HOSPITAL_COMMUNITY): Payer: Medicare HMO | Attending: Hematology

## 2020-03-03 DIAGNOSIS — E559 Vitamin D deficiency, unspecified: Secondary | ICD-10-CM | POA: Insufficient documentation

## 2020-03-03 DIAGNOSIS — E785 Hyperlipidemia, unspecified: Secondary | ICD-10-CM | POA: Diagnosis not present

## 2020-03-03 DIAGNOSIS — M13842 Other specified arthritis, left hand: Secondary | ICD-10-CM | POA: Diagnosis not present

## 2020-03-03 DIAGNOSIS — Z809 Family history of malignant neoplasm, unspecified: Secondary | ICD-10-CM | POA: Insufficient documentation

## 2020-03-03 DIAGNOSIS — R200 Anesthesia of skin: Secondary | ICD-10-CM | POA: Insufficient documentation

## 2020-03-03 DIAGNOSIS — K449 Diaphragmatic hernia without obstruction or gangrene: Secondary | ICD-10-CM | POA: Insufficient documentation

## 2020-03-03 DIAGNOSIS — D709 Neutropenia, unspecified: Secondary | ICD-10-CM | POA: Insufficient documentation

## 2020-03-03 DIAGNOSIS — D72820 Lymphocytosis (symptomatic): Secondary | ICD-10-CM | POA: Diagnosis not present

## 2020-03-03 DIAGNOSIS — M069 Rheumatoid arthritis, unspecified: Secondary | ICD-10-CM | POA: Insufficient documentation

## 2020-03-03 DIAGNOSIS — Z9081 Acquired absence of spleen: Secondary | ICD-10-CM | POA: Insufficient documentation

## 2020-03-03 DIAGNOSIS — R634 Abnormal weight loss: Secondary | ICD-10-CM | POA: Insufficient documentation

## 2020-03-03 DIAGNOSIS — K219 Gastro-esophageal reflux disease without esophagitis: Secondary | ICD-10-CM | POA: Insufficient documentation

## 2020-03-03 DIAGNOSIS — M13841 Other specified arthritis, right hand: Secondary | ICD-10-CM | POA: Insufficient documentation

## 2020-03-03 DIAGNOSIS — Z79899 Other long term (current) drug therapy: Secondary | ICD-10-CM | POA: Diagnosis not present

## 2020-03-03 DIAGNOSIS — F17210 Nicotine dependence, cigarettes, uncomplicated: Secondary | ICD-10-CM | POA: Diagnosis not present

## 2020-03-03 LAB — CBC WITH DIFFERENTIAL/PLATELET
Basophils Absolute: 0.1 10*3/uL (ref 0.0–0.1)
Basophils Relative: 1 %
Eosinophils Absolute: 0 10*3/uL (ref 0.0–0.5)
Eosinophils Relative: 0 %
HCT: 38.7 % (ref 36.0–46.0)
Hemoglobin: 13 g/dL (ref 12.0–15.0)
Lymphocytes Relative: 87 %
Lymphs Abs: 8 10*3/uL — ABNORMAL HIGH (ref 0.7–4.0)
MCH: 32.1 pg (ref 26.0–34.0)
MCHC: 33.6 g/dL (ref 30.0–36.0)
MCV: 95.6 fL (ref 80.0–100.0)
Monocytes Absolute: 0.5 10*3/uL (ref 0.1–1.0)
Monocytes Relative: 5 %
Neutro Abs: 0.6 10*3/uL — ABNORMAL LOW (ref 1.7–7.7)
Neutrophils Relative %: 7 %
Platelets: 340 10*3/uL (ref 150–400)
RBC: 4.05 MIL/uL (ref 3.87–5.11)
RDW: 17.1 % — ABNORMAL HIGH (ref 11.5–15.5)
WBC: 9.2 10*3/uL (ref 4.0–10.5)
nRBC: 0.2 % (ref 0.0–0.2)

## 2020-03-03 LAB — LACTATE DEHYDROGENASE: LDH: 157 U/L (ref 98–192)

## 2020-03-10 ENCOUNTER — Inpatient Hospital Stay (HOSPITAL_COMMUNITY): Payer: Medicare HMO | Admitting: Hematology

## 2020-03-10 ENCOUNTER — Other Ambulatory Visit: Payer: Self-pay

## 2020-03-10 VITALS — BP 144/60 | HR 95 | Temp 97.4°F | Resp 18 | Wt 104.4 lb

## 2020-03-10 DIAGNOSIS — D709 Neutropenia, unspecified: Secondary | ICD-10-CM | POA: Diagnosis not present

## 2020-03-10 DIAGNOSIS — R200 Anesthesia of skin: Secondary | ICD-10-CM | POA: Diagnosis not present

## 2020-03-10 DIAGNOSIS — M13841 Other specified arthritis, right hand: Secondary | ICD-10-CM | POA: Diagnosis not present

## 2020-03-10 DIAGNOSIS — R634 Abnormal weight loss: Secondary | ICD-10-CM | POA: Diagnosis not present

## 2020-03-10 DIAGNOSIS — M069 Rheumatoid arthritis, unspecified: Secondary | ICD-10-CM | POA: Diagnosis not present

## 2020-03-10 DIAGNOSIS — D72820 Lymphocytosis (symptomatic): Secondary | ICD-10-CM

## 2020-03-10 DIAGNOSIS — K449 Diaphragmatic hernia without obstruction or gangrene: Secondary | ICD-10-CM | POA: Diagnosis not present

## 2020-03-10 DIAGNOSIS — M13842 Other specified arthritis, left hand: Secondary | ICD-10-CM | POA: Diagnosis not present

## 2020-03-10 DIAGNOSIS — K219 Gastro-esophageal reflux disease without esophagitis: Secondary | ICD-10-CM | POA: Diagnosis not present

## 2020-03-10 MED ORDER — DRONABINOL 2.5 MG PO CAPS: 2.5000 mg | ORAL_CAPSULE | Freq: Two times a day (BID) | ORAL | 1 refills | Status: DC

## 2020-03-10 NOTE — Progress Notes (Signed)
[place], [place] [address]   CLINIC:  Medical Oncology/Hematology  PCP:  Celene Squibb, MD [address] Liana Cro[place] [address]  [phone]  REASON FOR VISIT:  Follow-up for lymphocytosis and neutropenia  PRIOR THERAPY: Splenectomy in 1970's  CURRENT THERAPY: Observation  INTERVAL HISTORY:  Sheri Pope, a 80 y.o. female, returns for routine follow-up for her lymphocytosis and neutropenia. Sheri Pope was last seen on 11/11/2019.  Today she is accompanied by her niece and she reports feeling poorly. She continues losing weight because her appetite is down and she has to make herself eat. She continues having pain in the joints of her hands and fingers due to the arthritis, and on top of that she started having numbness in her left hand. She denies having any recent infections, F/C or night sweats.  She is not interested in pursing pharmacological interventions for her arthritis due to numerous adverse reactions.   REVIEW OF SYSTEMS:  Review of Systems  Constitutional: Positive for appetite change (25%), fatigue (25%) and unexpected weight change (lost 6 lbs in 4 months). Negative for chills, diaphoresis and fever.  Gastrointestinal: Positive for nausea.  Musculoskeletal: Positive for arthralgias (arthritic pain in bilat hand joints) and myalgias (7/10 groin, arms and hands pain).  Neurological: Positive for dizziness and numbness (L hand).  All other systems reviewed and are negative.   PAST MEDICAL/SURGICAL HISTORY:  Past Medical History:  Diagnosis Date  . Arthritis    RA AND OA --PAIN AND SWELLIN IN LEFT KNEE  . Blood in urine    NEGATIVE UROLOGY WORK UP --BUT ALWAYS HAS BLOOD IN URINE  . GERD (gastroesophageal reflux disease)   . H/O hiatal hernia   . Hyperlipidemia   . PONV (postoperative nausea and vomiting)    ALWAYS SICK AFTER SURGERIES EXCEPT AFTER HIP REPLACMENT 2007  . Vitamin D deficiency    Past Surgical  History:  Procedure Laterality Date  . ABDOMINAL HYSTERECTOMY  1870'S  . BREAST SURGERY     BREAST BIOSPIES  . CATARACT EXTRACTION W/PHACO Left 05/30/2012   Procedure: CATARACT EXTRACTION PHACO AND INTRAOCULAR LENS PLACEMENT (IOC);  Surgeon: Tonny Branch, MD;  Location: [facility];  Service: Ophthalmology;  Laterality: Left;  CDE: 16.99  . CATARACT EXTRACTION W/PHACO Right 06/24/2012   Procedure: CATARACT EXTRACTION PHACO AND INTRAOCULAR LENS PLACEMENT (IOC);  Surgeon: Tonny Branch, MD;  Location: [facility];  Service: Ophthalmology;  Laterality: Right;  CDE:17.92  . HIP SURGERY    . JOINT REPLACEMENT  2007   RIGHT HIP REPLACEMENT  . LEFT KNEE ARTHROSCOPY  AUG 2012  . MULTIPLE ORTHOPEDIC SURGERIES     ON BOTH HANDS AND BOTH FEET, RIGHT ELBOW  . SPLEENECTOMY  IN THE 70'S   FOR LARGE CYST  . TOTAL KNEE ARTHROPLASTY  11/10/2011   Procedure: TOTAL KNEE ARTHROPLASTY;  Surgeon: Mcarthur Rossetti, MD;  Location: [facility];  Service: Orthopedics;  Laterality: Left;  Left Total Knee Arthroplasty  . YAG LASER APPLICATION Bilateral    Dr. Geoffry Paradise    SOCIAL HISTORY:  Social History   Socioeconomic History  . Marital status: Widowed    Spouse name: Not on file  . Number of children: Not on file  . Years of education: Not on file  . Highest education level: Not on file  Occupational History  . Occupation: retired  Tobacco Use  . Smoking status: Current Every Day Smoker    Packs/day: 1.00  Years: 50.00    Pack years: 50.00    Types: Cigarettes  . Smokeless tobacco: Never Used  Substance and Sexual Activity  . Alcohol use: No  . Drug use: No  . Sexual activity: Not Currently    Birth control/protection: Surgical  Other Topics Concern  . Not on file  Social History Narrative  . Not on file   Social Determinants of Health   Financial Resource Strain: Not on file  Food Insecurity: Not on file  Transportation Needs: Not on file  Physical Activity: Not on file  Stress: Not on file  Social  Connections: Not on file  Intimate Partner Violence: Not on file    FAMILY HISTORY:  Family History  Problem Relation Age of Onset  . Diabetes Sister   . Macular degeneration Sister   . Heart attack Mother   . Stroke Father   . Dementia Father   . Cancer Brother   . Healthy Sister   . Healthy Sister   . Diabetes Brother     CURRENT MEDICATIONS:  Current Outpatient Medications  Medication Sig Dispense Refill  . dronabinol (MARINOL) 2.5 MG capsule Take 1 capsule (2.5 mg total) by mouth 2 (two) times daily before a meal. 60 capsule 1  . lactose free nutrition (BOOST) LIQD Take 1 Container by mouth daily.    . Polyvinyl Alcohol-Povidone (REFRESH OP) Place 1 drop into both eyes 3 (three) times daily.    Marland Kitchen ALPRAZolam (XANAX) 0.25 MG tablet Take 0.25 mg by mouth every 8 (eight) hours as needed. (Patient not taking: Reported on 03/10/2020)    . alum & mag hydroxide-simeth (MAALOX/MYLANTA) 200-200-20 MG/5ML suspension Take 5 mLs by mouth every 6 (six) hours as needed. Acid reflux (Patient not taking: Reported on 11/11/2019)    . diclofenac sodium (VOLTAREN) 1 % GEL Apply 2 g topically 4 (four) times daily. 100 g 3  . HYDROcodone-acetaminophen (NORCO/VICODIN) 5-325 MG tablet Take 0.5-1 tablets by mouth every 6 (six) hours as needed. (Patient not taking: Reported on 03/10/2020)    . meclizine (ANTIVERT) 25 MG tablet Take 1 tablet (25 mg total) by mouth 3 (three) times daily as needed for dizziness. (Patient not taking: Reported on 03/10/2020) 30 tablet 0  . methocarbamol (ROBAXIN) 500 MG tablet Take 500 mg by mouth every 6 (six) hours as needed. (Patient not taking: Reported on 03/10/2020)     No current facility-administered medications for this visit.    ALLERGIES:  Allergies  Allergen Reactions  . Codeine Nausea And Vomiting  . Prednisone Other (See Comments)    Thought she was having a heart attack  . Sulfa Antibiotics Other (See Comments)    unknown  . Aleve [Naproxen] Anxiety     Jittery and hot flashes  . Latex Rash    PHYSICAL EXAM:  Performance status (ECOG): 1 - Symptomatic but completely ambulatory  Vitals:   03/10/20 1103  BP: (!) 144/60  Pulse: 95  Resp: 18  Temp: (!) 97.4 F (36.3 C)  SpO2: 98%   Wt Readings from Last 3 Encounters:  03/10/20 104 lb 6.4 oz (47.4 kg)  11/11/19 110 lb 9.6 oz (50.2 kg)  10/21/19 110 lb 3.2 oz (50 kg)   Physical Exam Vitals reviewed.  Constitutional:      Appearance: Normal appearance.  Cardiovascular:     Rate and Rhythm: Normal rate and regular rhythm.     Pulses: Normal pulses.     Heart sounds: Normal heart sounds.  Pulmonary:  Effort: Pulmonary effort is normal.     Breath sounds: Normal breath sounds.  Chest:  Breasts:     Right: No axillary adenopathy or supraclavicular adenopathy.     Left: Axillary adenopathy (sub-cm LN) present. No supraclavicular adenopathy.    Abdominal:     Palpations: Abdomen is soft. There is no hepatomegaly or mass.     Tenderness: There is no abdominal tenderness.     Hernia: No hernia is present.  Musculoskeletal:     Right hand: Deformity (in MCP & PIP joints) present.     Left hand: Deformity (in MCP & PIP joints) present.  Lymphadenopathy:     Cervical: No cervical adenopathy.     Upper Body:     Right upper body: No supraclavicular, axillary or pectoral adenopathy.     Left upper body: Axillary adenopathy (sub-cm LN) present. No supraclavicular adenopathy.     Lower Body: No right inguinal adenopathy. No left inguinal adenopathy.  Neurological:     General: No focal deficit present.     Mental Status: She is alert and oriented to person, place, and time.  Psychiatric:        Mood and Affect: Mood normal.        Behavior: Behavior normal.     LABORATORY DATA:  I have reviewed the labs as listed.  CBC Latest Ref Rng & Units 03/03/2020 10/21/2019 12/11/2016  WBC 4.0 - 10.5 K/uL 9.2 13.5(H) 7.4  Hemoglobin 12.0 - 15.0 g/dL 13.0 14.2 13.2  Hematocrit 36.0 -  46.0 % 38.7 42.7 39.8  Platelets 150 - 400 K/uL 340 285 363   CMP Latest Ref Rng & Units 11/06/2019 04/17/2018 12/11/2016  Glucose 65 - 99 mg/dL - - 96  BUN 6 - 20 mg/dL - - 11  Creatinine 0.44 - 1.00 mg/dL 0.60 0.70 0.76  Sodium 135 - 145 mmol/L - - 134(L)  Potassium 3.5 - 5.1 mmol/L - - 4.0  Chloride 101 - 111 mmol/L - - 101  CO2 22 - 32 mmol/L - - 25  Calcium 8.9 - 10.3 mg/dL - - 9.0  Total Protein 6.5 - 8.1 g/dL - - 7.6  Total Bilirubin 0.3 - 1.2 mg/dL - - 0.4  Alkaline Phos 38 - 126 U/L - - 95  AST 15 - 41 U/L - - 13(L)  ALT 14 - 54 U/L - - 10(L)      Component Value Date/Time   RBC 4.05 03/03/2020 0952   MCV 95.6 03/03/2020 0952   MCH 32.1 03/03/2020 0952   MCHC 33.6 03/03/2020 0952   RDW 17.1 (H) 03/03/2020 0952   LYMPHSABS 8.0 (H) 03/03/2020 0952   MONOABS 0.5 03/03/2020 0952   EOSABS 0.0 03/03/2020 0952   BASOSABS 0.1 03/03/2020 0952   Lab Results  Component Value Date   LDH 157 03/03/2020   LDH 176 10/21/2019    DIAGNOSTIC IMAGING:  I have independently reviewed the scans and discussed with the patient. No results found.   ASSESSMENT:  1. Lymphocytic leukocytosis: -CBC on 10-13-2019 shows white count 13.5, with differential showing 2% neutrophils, 94% lymphocytes, 3% monocytes and 1% basophils. -Hemoglobin and platelet count were normal. -She had history of splenectomy at age 82 secondary to a cyst on it. -She reported 5 to 10 pound weight loss in the last 6 months but denies any fevers or night sweats. -Reports decreased appetite and decreased energy levels for the last 5 to 6 weeks. -CT CAP on 11/06/2019 showed a borderline prominent right axillary  lymph node, 1.4 cm right upper normal-sized left axillary and right subpectoral lymph nodes with no overtly pathologic adenopathy.  Mild chronic diffuse prominence of thyroid gland without discrete nodule. - T-cell receptor gene rearrangement showed clonal T-cell gamma population as well as clonal T-cell beta  population detected.  This usually reflects presence of T-cell lymphocytic neoplasm.  2. Social/family history: -She lives at home with her nephew. She did office work prior to retirement. -She is a current active smoker, 1 pack/day for 50+ years. -Brother had cancer, type unknown.  3. Rheumatoid arthritis: -She has rheumatoid arthritis of the extremities, predominantly in the upper extremities with rheumatoid nodules. -Uses Voltaren and aspirin cream.   PLAN:  1. Lymphocytic leukocytosis: -She denies any infections, fevers, night sweats in the last 3 to 4 months. - Reviewed results of T-cell gene rearrangement which was positive for gamma and beta. - Physical examination did not reveal any worsening adenopathy in the axillary region.  She had prior splenectomy. - Reviewed CBC from 03/03/2020.  White count normalized at 9.2 even though absolute lymphocyte count was elevated at 8000.  Neutrophil count is 600. - No intervention needed at this time.  We will follow-up in 4 months with repeat labs.  2. Weight loss: -She has lost 6 pounds from last visit 3 to 4 months ago. - CT CAP from 11/06/2019 did not show any major causes of weight loss.  She reports decreased appetite. - We will start her on Marinol 2.5 mg twice daily and titrated up.  Orders placed this encounter:  No orders of the defined types were placed in this encounter.    Derek Jack, MD [facility] [phone]   I, Milinda Antis, am acting as a scribe for Dr. Sanda Linger.  I, Derek Jack MD, have reviewed the above documentation for accuracy and completeness, and I agree with the above.

## 2020-03-10 NOTE — Patient Instructions (Signed)
[place] at [facility] Discharge Instructions  You were seen today by Dr. Delton Coombes. He went over your recent results and scans. You will be prescribed Marinol 2.5 mg to take twice daily to improve your appetite; if after a month the tablets are not helping, start taking 2 tablets twice daily. Dr. Delton Coombes will see you back in 4 months for labs and follow up.   Thank you for choosing [place] at [facility] to provide your oncology and hematology care.  To afford each patient quality time with our provider, please arrive at least 15 minutes before your scheduled appointment time.   If you have a lab appointment with the [facility] please come in thru the Main Entrance and check in at the main information desk  You need to re-schedule your appointment should you arrive 10 or more minutes late.  We strive to give you quality time with our providers, and arriving late affects you and other patients whose appointments are after yours.  Also, if you no show three or more times for appointments you may be dismissed from the clinic at the providers discretion.     Again, thank you for choosing [facility].  Our hope is that these requests will decrease the amount of time that you wait before being seen by our physicians.       _____________________________________________________________  Should you have questions after your visit to [facility], please contact our office at [phone] between the hours of 8:00 a.m. and 4:30 p.m.  Voicemails left after 4:00 p.m. will not be returned until the following business day.  For prescription refill requests, have your pharmacy contact our office and allow 72 hours.    Cancer Center Support Programs:   > Cancer Support Group  2nd Tuesday of the month 1pm-2pm, Journey Room

## 2020-03-11 ENCOUNTER — Other Ambulatory Visit (HOSPITAL_COMMUNITY): Payer: Self-pay

## 2020-03-11 DIAGNOSIS — R634 Abnormal weight loss: Secondary | ICD-10-CM

## 2020-03-11 MED ORDER — DRONABINOL 2.5 MG PO CAPS: 2.5000 mg | ORAL_CAPSULE | Freq: Two times a day (BID) | ORAL | 1 refills | Status: DC

## 2020-03-17 DIAGNOSIS — R350 Frequency of micturition: Secondary | ICD-10-CM | POA: Diagnosis not present

## 2020-03-17 DIAGNOSIS — H9202 Otalgia, left ear: Secondary | ICD-10-CM | POA: Diagnosis not present

## 2020-03-17 DIAGNOSIS — L89152 Pressure ulcer of sacral region, stage 2: Secondary | ICD-10-CM | POA: Diagnosis not present

## 2020-04-26 DIAGNOSIS — E871 Hypo-osmolality and hyponatremia: Secondary | ICD-10-CM | POA: Diagnosis not present

## 2020-04-26 DIAGNOSIS — H9202 Otalgia, left ear: Secondary | ICD-10-CM | POA: Diagnosis not present

## 2020-04-26 DIAGNOSIS — K219 Gastro-esophageal reflux disease without esophagitis: Secondary | ICD-10-CM | POA: Diagnosis not present

## 2020-04-26 DIAGNOSIS — R350 Frequency of micturition: Secondary | ICD-10-CM | POA: Diagnosis not present

## 2020-04-26 DIAGNOSIS — L89152 Pressure ulcer of sacral region, stage 2: Secondary | ICD-10-CM | POA: Diagnosis not present

## 2020-05-26 DIAGNOSIS — R350 Frequency of micturition: Secondary | ICD-10-CM | POA: Diagnosis not present

## 2020-05-26 DIAGNOSIS — H9202 Otalgia, left ear: Secondary | ICD-10-CM | POA: Diagnosis not present

## 2020-05-26 DIAGNOSIS — L89152 Pressure ulcer of sacral region, stage 2: Secondary | ICD-10-CM | POA: Diagnosis not present

## 2020-05-26 DIAGNOSIS — K219 Gastro-esophageal reflux disease without esophagitis: Secondary | ICD-10-CM | POA: Diagnosis not present

## 2020-05-26 DIAGNOSIS — E871 Hypo-osmolality and hyponatremia: Secondary | ICD-10-CM | POA: Diagnosis not present

## 2020-06-02 DIAGNOSIS — H43313 Vitreous membranes and strands, bilateral: Secondary | ICD-10-CM | POA: Diagnosis not present

## 2020-06-08 ENCOUNTER — Encounter: Payer: Self-pay | Admitting: Emergency Medicine

## 2020-06-08 ENCOUNTER — Ambulatory Visit
Admission: EM | Admit: 2020-06-08 | Discharge: 2020-06-08 | Disposition: A | Discharge status: Home / Self Care | Payer: Medicare HMO | Attending: Emergency Medicine | Admitting: Emergency Medicine

## 2020-06-08 DIAGNOSIS — S41101A Unspecified open wound of right upper arm, initial encounter: Secondary | ICD-10-CM

## 2020-06-08 DIAGNOSIS — L03113 Cellulitis of right upper limb: Secondary | ICD-10-CM | POA: Diagnosis not present

## 2020-06-08 HISTORY — DX: Leukemia, unspecified not having achieved remission: C95.90

## 2020-06-08 MED ORDER — CEFTRIAXONE SODIUM 1 G IJ SOLR
1.0000 g | Freq: Once | INTRAMUSCULAR | Status: AC
  Administered 2020-06-08: 1 g via INTRAMUSCULAR

## 2020-06-08 MED ORDER — DOXYCYCLINE HYCLATE 100 MG PO CAPS: 100.0000 mg | ORAL_CAPSULE | Freq: Two times a day (BID) | ORAL | 0 refills | Status: DC

## 2020-06-08 NOTE — ED Notes (Signed)
Edge of redness marked on arm.  Patient educated about boarders and if they spread to go to the ED.

## 2020-06-08 NOTE — Discharge Instructions (Addendum)
Wash site daily with warm water and mild soap Keep covered to avoid friction Antibiotic shot given in office Take antibiotic as prescribed and to completion Follow up here or with PCP for recheck Return or go to the ED if you have any new or worsening symptoms increased redness, swelling, pain, nausea, vomiting, fever, chills, etc..Marland Kitchen

## 2020-06-08 NOTE — ED Provider Notes (Signed)
[place]   [id number] 06/08/20 Arrival Time: 5102   CC: RT arm infection  SUBJECTIVE:  Sheri Pope is a 80 y.o. female who presents with a possible infection of her RT arm x 2 days.  Cut wrist while cleaning out flower pot.  Tried putting neosporin on it without relief.  Sore to the touch.  Denies similar symptoms.  Complains of redness, swelling, and warm to the touch.  Denies fever, chills, nausea, vomiting.    ROS: As per HPI.  All other pertinent ROS negative.     Past Medical History:  Diagnosis Date  . Arthritis    RA AND OA --PAIN AND SWELLIN IN LEFT KNEE  . Blood in urine    NEGATIVE UROLOGY WORK UP --BUT ALWAYS HAS BLOOD IN URINE  . GERD (gastroesophageal reflux disease)   . H/O hiatal hernia   . Hyperlipidemia   . Leukemia ([place])   . PONV (postoperative nausea and vomiting)    ALWAYS SICK AFTER SURGERIES EXCEPT AFTER HIP REPLACMENT 2007  . Vitamin D deficiency    Past Surgical History:  Procedure Laterality Date  . ABDOMINAL HYSTERECTOMY  1870'S  . BREAST SURGERY     BREAST BIOSPIES  . CATARACT EXTRACTION W/PHACO Left 05/30/2012   Procedure: CATARACT EXTRACTION PHACO AND INTRAOCULAR LENS PLACEMENT (IOC);  Surgeon: Tonny Branch, MD;  Location: [facility];  Service: Ophthalmology;  Laterality: Left;  CDE: 16.99  . CATARACT EXTRACTION W/PHACO Right 06/24/2012   Procedure: CATARACT EXTRACTION PHACO AND INTRAOCULAR LENS PLACEMENT (IOC);  Surgeon: Tonny Branch, MD;  Location: [facility];  Service: Ophthalmology;  Laterality: Right;  CDE:17.92  . HIP SURGERY    . JOINT REPLACEMENT  2007   RIGHT HIP REPLACEMENT  . LEFT KNEE ARTHROSCOPY  AUG 2012  . MULTIPLE ORTHOPEDIC SURGERIES     ON BOTH HANDS AND BOTH FEET, RIGHT ELBOW  . SPLEENECTOMY  IN THE 70'S   FOR LARGE CYST  . TOTAL KNEE ARTHROPLASTY  11/10/2011   Procedure: TOTAL KNEE ARTHROPLASTY;  Surgeon: Mcarthur Rossetti, MD;  Location: [facility];  Service: Orthopedics;  Laterality: Left;  Left Total Knee  Arthroplasty  . YAG LASER APPLICATION Bilateral    Dr. Geoffry Paradise   Allergies  Allergen Reactions  . Codeine Nausea And Vomiting  . Prednisone Other (See Comments)    Thought she was having a heart attack  . Sulfa Antibiotics Other (See Comments)    unknown  . Aleve [Naproxen] Anxiety    Jittery and hot flashes  . Latex Rash   No current facility-administered medications on file prior to encounter.   Current Outpatient Medications on File Prior to Encounter  Medication Sig Dispense Refill  . diclofenac sodium (VOLTAREN) 1 % GEL Apply 2 g topically 4 (four) times daily. 100 g 3  . dronabinol (MARINOL) 2.5 MG capsule Take 1 capsule (2.5 mg total) by mouth 2 (two) times daily before a meal. 60 capsule 1  . lactose free nutrition (BOOST) LIQD Take 1 Container by mouth daily.    . Polyvinyl Alcohol-Povidone (REFRESH OP) Place 1 drop into both eyes 3 (three) times daily.     Social History   Socioeconomic History  . Marital status: Widowed    Spouse name: Not on file  . Number of children: Not on file  . Years of education: Not on file  . Highest education level: Not on file  Occupational History  . Occupation: retired  Tobacco Use  . Smoking status: Current Every Day  Smoker    Packs/day: 1.00    Years: 50.00    Pack years: 50.00    Types: Cigarettes  . Smokeless tobacco: Never Used  Substance and Sexual Activity  . Alcohol use: No  . Drug use: No  . Sexual activity: Not Currently    Birth control/protection: Surgical  Other Topics Concern  . Not on file  Social History Narrative  . Not on file   Social Determinants of Health   Financial Resource Strain: Not on file  Food Insecurity: Not on file  Transportation Needs: Not on file  Physical Activity: Not on file  Stress: Not on file  Social Connections: Not on file  Intimate Partner Violence: Not on file   Family History  Problem Relation Age of Onset  . Diabetes Sister   . Macular degeneration Sister   . Heart  attack Mother   . Stroke Father   . Dementia Father   . Cancer Brother   . Healthy Sister   . Healthy Sister   . Diabetes Brother     OBJECTIVE:  Vitals:   06/08/20 0842  BP: 138/74  Pulse: (!) 110  Resp: 20  Temp: 98.2 F (36.8 C)  TempSrc: Oral  SpO2: 94%     General appearance: alert; no distress CV: Radial pulse 2+ Skin: superficial round abrasion to RT lateral anterior wrist apx 1 cm in diameter, significant erythema up towards inferior elbow, no circumferential involvement, all localized to anterior aspect, apx 5 cm of surrounding erythema Psychological: alert and cooperative; normal mood and affect  ASSESSMENT & PLAN:  1. Arm wound, right, initial encounter   2. Cellulitis of arm, right     Meds ordered this encounter  Medications  . doxycycline (VIBRAMYCIN) 100 MG capsule    Sig: Take 1 capsule (100 mg total) by mouth 2 (two) times daily.    Dispense:  20 capsule    Refill:  0    Order Specific Question:   Supervising Provider    Answer:   Raylene Everts [[id number]]  . cefTRIAXone (ROCEPHIN) injection 1 g   Wash site daily with warm water and mild soap Keep covered to avoid friction Antibiotic shot given in office Take antibiotic as prescribed and to completion Follow up here or with PCP for recheck Return or go to the ED if you have any new or worsening symptoms increased redness, swelling, pain, nausea, vomiting, fever, chills, etc...    Reviewed expectations re: course of current medical issues. Questions answered. Outlined signs and symptoms indicating need for more acute intervention. Patient verbalized understanding. After Visit Summary given.          Lestine Box, PA-C 06/08/20 [phone]

## 2020-06-08 NOTE — ED Triage Notes (Signed)
States she nicked her right arm on Sunday.  Sunday night she started having pain.  Yesterday redness started to run up arm.

## 2020-07-08 DIAGNOSIS — R350 Frequency of micturition: Secondary | ICD-10-CM | POA: Diagnosis not present

## 2020-07-08 DIAGNOSIS — K219 Gastro-esophageal reflux disease without esophagitis: Secondary | ICD-10-CM | POA: Diagnosis not present

## 2020-07-13 ENCOUNTER — Inpatient Hospital Stay (HOSPITAL_COMMUNITY): Payer: Medicare HMO | Attending: Hematology

## 2020-07-13 ENCOUNTER — Other Ambulatory Visit (HOSPITAL_COMMUNITY): Payer: Medicare HMO

## 2020-07-13 ENCOUNTER — Other Ambulatory Visit: Payer: Self-pay

## 2020-07-13 ENCOUNTER — Encounter (HOSPITAL_COMMUNITY): Payer: Self-pay

## 2020-07-13 DIAGNOSIS — M069 Rheumatoid arthritis, unspecified: Secondary | ICD-10-CM | POA: Insufficient documentation

## 2020-07-13 DIAGNOSIS — D709 Neutropenia, unspecified: Secondary | ICD-10-CM | POA: Diagnosis not present

## 2020-07-13 DIAGNOSIS — F17210 Nicotine dependence, cigarettes, uncomplicated: Secondary | ICD-10-CM | POA: Diagnosis not present

## 2020-07-13 DIAGNOSIS — R634 Abnormal weight loss: Secondary | ICD-10-CM | POA: Insufficient documentation

## 2020-07-13 DIAGNOSIS — E559 Vitamin D deficiency, unspecified: Secondary | ICD-10-CM | POA: Insufficient documentation

## 2020-07-13 DIAGNOSIS — E785 Hyperlipidemia, unspecified: Secondary | ICD-10-CM | POA: Insufficient documentation

## 2020-07-13 DIAGNOSIS — K219 Gastro-esophageal reflux disease without esophagitis: Secondary | ICD-10-CM | POA: Diagnosis not present

## 2020-07-13 DIAGNOSIS — D72820 Lymphocytosis (symptomatic): Secondary | ICD-10-CM | POA: Diagnosis not present

## 2020-07-13 LAB — CBC WITH DIFFERENTIAL/PLATELET
Abs Immature Granulocytes: 0 10*3/uL (ref 0.00–0.07)
Basophils Absolute: 0.1 10*3/uL (ref 0.0–0.1)
Basophils Relative: 1 %
Eosinophils Absolute: 0 10*3/uL (ref 0.0–0.5)
Eosinophils Relative: 0 %
HCT: 40.3 % (ref 36.0–46.0)
Hemoglobin: 13.6 g/dL (ref 12.0–15.0)
Immature Granulocytes: 0 %
Lymphocytes Relative: 96 %
Lymphs Abs: 12.8 10*3/uL — ABNORMAL HIGH (ref 0.7–4.0)
MCH: 32.2 pg (ref 26.0–34.0)
MCHC: 33.7 g/dL (ref 30.0–36.0)
MCV: 95.3 fL (ref 80.0–100.0)
Monocytes Absolute: 0.3 10*3/uL (ref 0.1–1.0)
Monocytes Relative: 2 %
Neutro Abs: 0.1 10*3/uL — CL (ref 1.7–7.7)
Neutrophils Relative %: 1 %
Platelets: 297 10*3/uL (ref 150–400)
RBC: 4.23 MIL/uL (ref 3.87–5.11)
RDW: 19.2 % — ABNORMAL HIGH (ref 11.5–15.5)
WBC: 13.4 10*3/uL — ABNORMAL HIGH (ref 4.0–10.5)
nRBC: 0.1 % (ref 0.0–0.2)

## 2020-07-13 LAB — LACTATE DEHYDROGENASE: LDH: 174 U/L (ref 98–192)

## 2020-07-13 NOTE — Progress Notes (Signed)
Critical Value Alert:  ANC 0.1 Alert received at 1059 and Dr. Delton Coombes made aware. Patient scheduled to see Dr. Delton Coombes on 5/24. Awaiting further instructions.

## 2020-07-19 NOTE — Progress Notes (Signed)
Sheri Pope, Sheri Pope [id number]   CLINIC:  Medical Oncology/Hematology  PCP:  Celene Squibb, MD [address] Liana Crocker [place] [address]  [phone]  REASON FOR VISIT:  Follow-up for lymphocytosis and neutropenia  PRIOR THERAPY: Splenectomy in 1970's  CURRENT THERAPY: observation  INTERVAL HISTORY:  Sheri Pope, a 80 y.o. female, returns for routine follow-up for her  lymphocytosis and neutropenia. Sheri Pope was last seen on 03/10/2020.  Today she reports feeling okay. She had in infection in a small wound on her left wrist which was successfully treated with Doxycyline. She tried a CBD gummy, but reports it caused vision issues. She lost 4 lbs in 1 month. She reprots her appetite waxes and wanes. She is currently taking hydrocodone for the pain from her RA, but did not tolerate any other RA medication well.   REVIEW OF SYSTEMS:  Review of Systems  Constitutional: Positive for appetite change (25%), chills and fatigue (40%).  Musculoskeletal: Positive for arthralgias (5/10).  All other systems reviewed and are negative.   PAST MEDICAL/SURGICAL HISTORY:  Past Medical History:  Diagnosis Date  . Arthritis    RA AND OA --PAIN AND SWELLIN IN LEFT KNEE  . Blood in urine    NEGATIVE UROLOGY WORK UP --BUT ALWAYS HAS BLOOD IN URINE  . GERD (gastroesophageal reflux disease)   . H/O hiatal hernia   . Hyperlipidemia   . Leukemia ([facility])   . PONV (postoperative nausea and vomiting)    ALWAYS SICK AFTER SURGERIES EXCEPT AFTER HIP REPLACMENT 2007  . Vitamin D deficiency    Past Surgical History:  Procedure Laterality Date  . ABDOMINAL HYSTERECTOMY  1870'S  . BREAST SURGERY     BREAST BIOSPIES  . CATARACT EXTRACTION W/PHACO Left 05/30/2012   Procedure: CATARACT EXTRACTION PHACO AND INTRAOCULAR LENS PLACEMENT (IOC);  Surgeon: Tonny Branch, MD;  Location: [facility];  Service: Ophthalmology;  Laterality: Left;  CDE: 16.99  . CATARACT EXTRACTION  W/PHACO Right 06/24/2012   Procedure: CATARACT EXTRACTION PHACO AND INTRAOCULAR LENS PLACEMENT (IOC);  Surgeon: Tonny Branch, MD;  Location: [facility];  Service: Ophthalmology;  Laterality: Right;  CDE:17.92  . HIP SURGERY    . JOINT REPLACEMENT  2007   RIGHT HIP REPLACEMENT  . LEFT KNEE ARTHROSCOPY  AUG 2012  . MULTIPLE ORTHOPEDIC SURGERIES     ON BOTH HANDS AND BOTH FEET, RIGHT ELBOW  . SPLEENECTOMY  IN THE 70'S   FOR LARGE CYST  . TOTAL KNEE ARTHROPLASTY  11/10/2011   Procedure: TOTAL KNEE ARTHROPLASTY;  Surgeon: Mcarthur Rossetti, MD;  Location: [facility];  Service: Orthopedics;  Laterality: Left;  Left Total Knee Arthroplasty  . YAG LASER APPLICATION Bilateral    Dr. Geoffry Paradise    SOCIAL HISTORY:  Social History   Socioeconomic History  . Marital status: Widowed    Spouse name: Not on file  . Number of children: Not on file  . Years of education: Not on file  . Highest education level: Not on file  Occupational History  . Occupation: retired  Tobacco Use  . Smoking status: Current Every Day Smoker    Packs/day: 1.00    Years: 50.00    Pack years: 50.00    Types: Cigarettes  . Smokeless tobacco: Never Used  Substance and Sexual Activity  . Alcohol use: No  . Drug use: No  . Sexual activity: Not Currently    Birth control/protection: Surgical  Other Topics Concern  .  Not on file  Social History Narrative  . Not on file   Social Determinants of Health   Financial Resource Strain: Not on file  Food Insecurity: Not on file  Transportation Needs: Not on file  Physical Activity: Not on file  Stress: Not on file  Social Connections: Not on file  Intimate Partner Violence: Not on file    FAMILY HISTORY:  Family History  Problem Relation Age of Onset  . Diabetes Sister   . Macular degeneration Sister   . Heart attack Mother   . Stroke Father   . Dementia Father   . Cancer Brother   . Healthy Sister   . Healthy Sister   . Diabetes Brother     CURRENT MEDICATIONS:   Current Outpatient Medications  Medication Sig Dispense Refill  . diclofenac sodium (VOLTAREN) 1 % GEL Apply 2 g topically 4 (four) times daily. 100 g 3  . doxycycline (VIBRAMYCIN) 100 MG capsule Take 1 capsule (100 mg total) by mouth 2 (two) times daily. 20 capsule 0  . dronabinol (MARINOL) 2.5 MG capsule Take 1 capsule (2.5 mg total) by mouth 2 (two) times daily before a meal. 60 capsule 1  . lactose free nutrition (BOOST) LIQD Take 1 Container by mouth daily.    . Polyvinyl Alcohol-Povidone (REFRESH OP) Place 1 drop into both eyes 3 (three) times daily.     No current facility-administered medications for this visit.    ALLERGIES:  Allergies  Allergen Reactions  . Codeine Nausea And Vomiting  . Prednisone Other (See Comments)    Thought she was having a heart attack  . Sulfa Antibiotics Other (See Comments)    unknown  . Aleve [Naproxen] Anxiety    Jittery and hot flashes  . Latex Rash    PHYSICAL EXAM:  Performance status (ECOG): 1 - Symptomatic but completely ambulatory  There were no vitals filed for this visit. Wt Readings from Last 3 Encounters:  03/10/20 104 lb 6.4 oz (47.4 kg)  11/11/19 110 lb 9.6 oz (50.2 kg)  10/21/19 110 lb 3.2 oz (50 kg)   Physical Exam Vitals reviewed.  Constitutional:      Appearance: Normal appearance.  Cardiovascular:     Rate and Rhythm: Normal rate and regular rhythm.     Pulses: Normal pulses.     Heart sounds: Normal heart sounds.  Pulmonary:     Effort: Pulmonary effort is normal.     Breath sounds: Normal breath sounds.  Chest:  Breasts:     Right: No axillary adenopathy or supraclavicular adenopathy.     Left: No axillary adenopathy or supraclavicular adenopathy.    Abdominal:     Palpations: Abdomen is soft. There is no hepatomegaly, splenomegaly or mass.     Tenderness: There is no abdominal tenderness.  Lymphadenopathy:     Cervical: No cervical adenopathy.     Right cervical: No superficial cervical adenopathy.     Left cervical: No superficial cervical adenopathy.     Upper Body:     Right upper body: No supraclavicular or axillary adenopathy.     Left upper body: No supraclavicular or axillary adenopathy.     Lower Body: No right inguinal adenopathy. No left inguinal adenopathy.  Skin:    Comments: Rheumatoid nodules on shins bilaterally  Neurological:     General: No focal deficit present.     Mental Status: She is alert and oriented to person, place, and time.  Psychiatric:  Mood and Affect: Mood normal.        Behavior: Behavior normal.     LABORATORY DATA:  I have reviewed the labs as listed.  CBC Latest Ref Rng & Units 07/13/2020 03/03/2020 10/21/2019  WBC 4.0 - 10.5 K/uL 13.4(H) 9.2 13.5(H)  Hemoglobin 12.0 - 15.0 g/dL 13.6 13.0 14.2  Hematocrit 36.0 - 46.0 % 40.3 38.7 42.7  Platelets 150 - 400 K/uL 297 340 285   CMP Latest Ref Rng & Units 11/06/2019 04/17/2018 12/11/2016  Glucose 65 - 99 mg/dL - - 96  BUN 6 - 20 mg/dL - - 11  Creatinine 0.44 - 1.00 mg/dL 0.60 0.70 0.76  Sodium 135 - 145 mmol/L - - 134(L)  Potassium 3.5 - 5.1 mmol/L - - 4.0  Chloride 101 - 111 mmol/L - - 101  CO2 22 - 32 mmol/L - - 25  Calcium 8.9 - 10.3 mg/dL - - 9.0  Total Protein 6.5 - 8.1 g/dL - - 7.6  Total Bilirubin 0.3 - 1.2 mg/dL - - 0.4  Alkaline Phos 38 - 126 U/L - - 95  AST 15 - 41 U/L - - 13(L)  ALT 14 - 54 U/L - - 10(L)      Component Value Date/Time   RBC 4.23 07/13/2020 1040   MCV 95.3 07/13/2020 1040   MCH 32.2 07/13/2020 1040   MCHC 33.7 07/13/2020 1040   RDW 19.2 (H) 07/13/2020 1040   LYMPHSABS 12.8 (H) 07/13/2020 1040   MONOABS 0.3 07/13/2020 1040   EOSABS 0.0 07/13/2020 1040   BASOSABS 0.1 07/13/2020 1040    DIAGNOSTIC IMAGING:  I have independently reviewed the scans and discussed with the patient. No results found.   ASSESSMENT:  1. Lymphocytic leukocytosis: -CBC on 10-13-2019 shows white count 13.5, with differential showing 2% neutrophils, 94% lymphocytes, 3% monocytes and  1% basophils. -Hemoglobin and platelet count were normal. -She had history of splenectomy at age 61 secondary to a cyst on it. -She reported 5 to 10 pound weight loss in the last 6 months but denies any fevers or night sweats. -Reports decreased appetite and decreased energy levels for the last 5 to 6 weeks. -CT CAP on 11/06/2019 showed a borderline prominent right axillary lymph node, 1.4 cm right upper normal-sized left axillary and right subpectoral lymph nodes with no overtly pathologic adenopathy. Mild chronic diffuse prominence of thyroid gland without discrete nodule. - T-cell receptor gene rearrangement showed clonal T-cell gamma population as well as clonal T-cell beta population detected.  This usually reflects presence of T-cell lymphocytic neoplasm.  2. Social/family history: -She lives at home with her nephew. She did office work prior to retirement. -She is a current active smoker, 1 pack/day for 50+ years. -Brother had cancer, type unknown.  3. Rheumatoid arthritis: -She has rheumatoid arthritis of the extremities, predominantly in the upper extremities with rheumatoid nodules. -Uses Voltaren and aspirin cream.   PLAN:  1. Lymphocytic leukocytosis: -She does not report any fevers or night sweats. - Leukocytosis could be secondary to uncontrolled rheumatoid arthritis.  She had rheumatoid nodules on the shins and severe arthritic changes in the hands. - Reviewed labs from 07/13/2020 which showed white count 13.4 and normal hemoglobin and platelet count.  Differential showed predominantly lymphocytes.  Absolute neutrophil count was severely low.  LDH was 174. - As her [place] becomes severely low, I have recommended bone marrow aspiration biopsy.  We will likely schedule it in the next 2 to 3 weeks.  2. Weight loss: -She apparently  not started Marinol as her pharmacy did not have it.  CT CAP on 11/06/2019 did not show any clear etiology for weight loss. - We will send Marinol  2.5 mg twice daily to a different pharmacy.  She was told to start taking 2.5 mg once daily for the first 1 week and increase it if tolerated well.  Orders placed this encounter:  No orders of the defined types were placed in this encounter.    Derek Jack, MD [place] [phone]   I, Thana Ates, am acting as a scribe for Dr. Derek Jack.  I, Derek Jack MD, have reviewed the above documentation for accuracy and completeness, and I agree with the above.

## 2020-07-20 ENCOUNTER — Other Ambulatory Visit: Payer: Self-pay

## 2020-07-20 ENCOUNTER — Inpatient Hospital Stay (HOSPITAL_COMMUNITY): Payer: Medicare HMO | Admitting: Hematology

## 2020-07-20 ENCOUNTER — Ambulatory Visit (HOSPITAL_COMMUNITY): Payer: Medicare HMO | Admitting: Hematology

## 2020-07-20 VITALS — BP 155/63 | HR 98 | Temp 97.2°F | Resp 16 | Wt 100.1 lb

## 2020-07-20 DIAGNOSIS — K219 Gastro-esophageal reflux disease without esophagitis: Secondary | ICD-10-CM | POA: Diagnosis not present

## 2020-07-20 DIAGNOSIS — R634 Abnormal weight loss: Secondary | ICD-10-CM | POA: Diagnosis not present

## 2020-07-20 DIAGNOSIS — E785 Hyperlipidemia, unspecified: Secondary | ICD-10-CM | POA: Diagnosis not present

## 2020-07-20 DIAGNOSIS — F17210 Nicotine dependence, cigarettes, uncomplicated: Secondary | ICD-10-CM | POA: Diagnosis not present

## 2020-07-20 DIAGNOSIS — D72820 Lymphocytosis (symptomatic): Secondary | ICD-10-CM | POA: Diagnosis not present

## 2020-07-20 DIAGNOSIS — E559 Vitamin D deficiency, unspecified: Secondary | ICD-10-CM | POA: Diagnosis not present

## 2020-07-20 DIAGNOSIS — M069 Rheumatoid arthritis, unspecified: Secondary | ICD-10-CM | POA: Diagnosis not present

## 2020-07-20 DIAGNOSIS — D709 Neutropenia, unspecified: Secondary | ICD-10-CM

## 2020-07-20 MED ORDER — DRONABINOL 2.5 MG PO CAPS: 2.5000 mg | ORAL_CAPSULE | Freq: Two times a day (BID) | ORAL | 1 refills | Status: DC

## 2020-07-20 NOTE — Patient Instructions (Signed)
[facility] at [facility] °Discharge Instructions ° °You were seen today by Dr. Katragadda. He went over your recent results. Dr. Katragadda will see you back in 4 months for labs and follow up. ° ° °Thank you for choosing [facility] at [facility] to provide your oncology and hematology care.  To afford each patient quality time with our provider, please arrive at least 15 minutes before your scheduled appointment time.  ° °If you have a lab appointment with the Cancer Center please come in thru the Main Entrance and check in at the main information desk ° °You need to re-schedule your appointment should you arrive 10 or more minutes late.  We strive to give you quality time with our providers, and arriving late affects you and other patients whose appointments are after yours.  Also, if you no show three or more times for appointments you may be dismissed from the clinic at the providers discretion.     °Again, thank you for choosing [facility].  Our hope is that these requests will decrease the amount of time that you wait before being seen by our physicians.       °_____________________________________________________________ ° °Should you have questions after your visit to [facility], please contact our office at [phone] between the hours of 8:00 a.m. and 4:30 p.m.  Voicemails left after 4:00 p.m. will not be returned until the following business day.  For prescription refill requests, have your pharmacy contact our office and allow 72 hours.   ° °Cancer Center Support Programs:  ° °> Cancer Support Group  °2nd Tuesday of the month 1pm-2pm, Journey Room  ° ° °

## 2020-08-09 DIAGNOSIS — E559 Vitamin D deficiency, unspecified: Secondary | ICD-10-CM | POA: Diagnosis not present

## 2020-08-09 DIAGNOSIS — D72829 Elevated white blood cell count, unspecified: Secondary | ICD-10-CM | POA: Diagnosis not present

## 2020-08-09 DIAGNOSIS — M0579 Rheumatoid arthritis with rheumatoid factor of multiple sites without organ or systems involvement: Secondary | ICD-10-CM | POA: Diagnosis not present

## 2020-08-09 DIAGNOSIS — R634 Abnormal weight loss: Secondary | ICD-10-CM | POA: Diagnosis not present

## 2020-08-11 ENCOUNTER — Inpatient Hospital Stay (HOSPITAL_COMMUNITY): Payer: Medicare HMO | Attending: Hematology | Admitting: Hematology

## 2020-08-11 ENCOUNTER — Other Ambulatory Visit: Payer: Self-pay

## 2020-08-11 VITALS — BP 166/66 | HR 90 | Temp 98.1°F | Resp 16

## 2020-08-11 DIAGNOSIS — Z79899 Other long term (current) drug therapy: Secondary | ICD-10-CM | POA: Insufficient documentation

## 2020-08-11 DIAGNOSIS — D479 Neoplasm of uncertain behavior of lymphoid, hematopoietic and related tissue, unspecified: Secondary | ICD-10-CM | POA: Diagnosis not present

## 2020-08-11 DIAGNOSIS — K219 Gastro-esophageal reflux disease without esophagitis: Secondary | ICD-10-CM | POA: Diagnosis not present

## 2020-08-11 DIAGNOSIS — E559 Vitamin D deficiency, unspecified: Secondary | ICD-10-CM | POA: Diagnosis not present

## 2020-08-11 DIAGNOSIS — K769 Liver disease, unspecified: Secondary | ICD-10-CM | POA: Insufficient documentation

## 2020-08-11 DIAGNOSIS — Z9081 Acquired absence of spleen: Secondary | ICD-10-CM | POA: Diagnosis not present

## 2020-08-11 DIAGNOSIS — D709 Neutropenia, unspecified: Secondary | ICD-10-CM | POA: Diagnosis not present

## 2020-08-11 DIAGNOSIS — R634 Abnormal weight loss: Secondary | ICD-10-CM | POA: Diagnosis not present

## 2020-08-11 DIAGNOSIS — Z809 Family history of malignant neoplasm, unspecified: Secondary | ICD-10-CM | POA: Diagnosis not present

## 2020-08-11 DIAGNOSIS — E785 Hyperlipidemia, unspecified: Secondary | ICD-10-CM | POA: Insufficient documentation

## 2020-08-11 DIAGNOSIS — K5730 Diverticulosis of large intestine without perforation or abscess without bleeding: Secondary | ICD-10-CM | POA: Insufficient documentation

## 2020-08-11 DIAGNOSIS — M069 Rheumatoid arthritis, unspecified: Secondary | ICD-10-CM | POA: Diagnosis not present

## 2020-08-11 DIAGNOSIS — D72822 Plasmacytosis: Secondary | ICD-10-CM | POA: Diagnosis not present

## 2020-08-11 DIAGNOSIS — F17210 Nicotine dependence, cigarettes, uncomplicated: Secondary | ICD-10-CM | POA: Insufficient documentation

## 2020-08-11 LAB — CBC WITH DIFFERENTIAL/PLATELET
Abs Immature Granulocytes: 0.01 10*3/uL (ref 0.00–0.07)
Basophils Absolute: 0.1 10*3/uL (ref 0.0–0.1)
Basophils Relative: 1 %
Eosinophils Absolute: 0 10*3/uL (ref 0.0–0.5)
Eosinophils Relative: 0 %
HCT: 42 % (ref 36.0–46.0)
Hemoglobin: 14.2 g/dL (ref 12.0–15.0)
Immature Granulocytes: 0 %
Lymphocytes Relative: 95 %
Lymphs Abs: 12.5 10*3/uL — ABNORMAL HIGH (ref 0.7–4.0)
MCH: 32.2 pg (ref 26.0–34.0)
MCHC: 33.8 g/dL (ref 30.0–36.0)
MCV: 95.2 fL (ref 80.0–100.0)
Monocytes Absolute: 0.3 10*3/uL (ref 0.1–1.0)
Monocytes Relative: 3 %
Neutro Abs: 0.1 10*3/uL — CL (ref 1.7–7.7)
Neutrophils Relative %: 1 %
Platelets: 328 10*3/uL (ref 150–400)
RBC: 4.41 MIL/uL (ref 3.87–5.11)
RDW: 17.9 % — ABNORMAL HIGH (ref 11.5–15.5)
WBC: 13.3 10*3/uL — ABNORMAL HIGH (ref 4.0–10.5)
nRBC: 0.2 % (ref 0.0–0.2)

## 2020-08-11 NOTE — Progress Notes (Signed)
INDICATION:  Severe neutropenia  Bone Marrow Biopsy and Aspiration Procedure Note   The patient was identified by name and date of birth, prior to start of the procedure and a timeout was performed.   An informed consent was obtained after discussing potential risks including bleeding, infection and pain.  The left posterior iliac crest was palpated, cleaned with ChloraPrep, and drapes applied.  1% lidocaine is infiltrated into the skin, subcutaneous tissue and periosteum.  Bone marrow was aspirated and smears made.  With the help of Jamshidi needle a core biopsy was obtained.  Pressure was applied to the biopsy site and bandage was placed over the biopsy site. Patient was made to lie on the back for 15 mins prior to discharge.  The procedure was tolerated well. COMPLICATIONS: None BLOOD LOSS: none Patient was discharged home in stable condition to return in 2 weeks to review results.  Patient was provided with post bone marrow biopsy instructions and instructed to call if there was any bleeding or worsening pain.  Specimens sent for flow cytometry, cytogenetics and additional studies.  Signed Derek Jack, MD

## 2020-08-11 NOTE — Progress Notes (Signed)
Sheri Pope, [place] [address]   CLINIC:  Medical Oncology/Hematology  PCP:  Sheri Squibb, MD [address] Sheri Pope [place] [address]  [phone]  REASON FOR VISIT:  Follow-up for lymphocytosis and neutropenia  PRIOR THERAPY: Splenectomy in 1970's  CURRENT THERAPY: observation  INTERVAL HISTORY:  Sheri Pope, a 80 y.o. female, was seen for follow-up of severe neutropenia.  She denies any fevers, night sweats or weight loss.  She reports joint pains and generalized body pains which are stable.  She reports decreased energy levels and appetite.  No recent ER visits or hospitalizations.   REVIEW OF SYSTEMS:  Review of Systems  Constitutional:  Positive for fatigue (40%). Negative for chills. Appetite change: 25%. Musculoskeletal:  Positive for arthralgias (5/10).  All other systems reviewed and are negative.  PAST MEDICAL/SURGICAL HISTORY:  Past Medical History:  Diagnosis Date   Arthritis    RA AND OA --PAIN AND SWELLIN IN LEFT KNEE   Blood in urine    NEGATIVE UROLOGY WORK UP --BUT ALWAYS HAS BLOOD IN URINE   GERD (gastroesophageal reflux disease)    H/O hiatal hernia    Hyperlipidemia    Leukemia ([facility])    PONV (postoperative nausea and vomiting)    ALWAYS SICK AFTER SURGERIES EXCEPT AFTER HIP REPLACMENT 2007   Vitamin D deficiency    Past Surgical History:  Procedure Laterality Date   ABDOMINAL HYSTERECTOMY  1870'S   BREAST SURGERY     BREAST BIOSPIES   CATARACT EXTRACTION W/PHACO Left 05/30/2012   Procedure: CATARACT EXTRACTION PHACO AND INTRAOCULAR LENS PLACEMENT ([place]);  Surgeon: Tonny Branch, MD;  Location: [facility];  Service: Ophthalmology;  Laterality: Left;  CDE: 16.99   CATARACT EXTRACTION W/PHACO Right 06/24/2012   Procedure: CATARACT EXTRACTION PHACO AND INTRAOCULAR LENS PLACEMENT (IOC);  Surgeon: Tonny Branch, MD;  Location: [facility];  Service: Ophthalmology;  Laterality: Right;  CDE:17.92   HIP SURGERY     JOINT  REPLACEMENT  2007   RIGHT HIP REPLACEMENT   LEFT KNEE ARTHROSCOPY  AUG 2012   MULTIPLE ORTHOPEDIC SURGERIES     ON BOTH HANDS AND BOTH FEET, RIGHT ELBOW   SPLEENECTOMY  IN THE 70'S   FOR LARGE CYST   TOTAL KNEE ARTHROPLASTY  11/10/2011   Procedure: TOTAL KNEE ARTHROPLASTY;  Surgeon: Mcarthur Rossetti, MD;  Location: [facility];  Service: Orthopedics;  Laterality: Left;  Left Total Knee Arthroplasty   YAG LASER APPLICATION Bilateral    Dr. Geoffry Paradise    SOCIAL HISTORY:  Social History   Socioeconomic History   Marital status: Widowed    Spouse name: Not on file   Number of children: Not on file   Years of education: Not on file   Highest education level: Not on file  Occupational History   Occupation: retired  Tobacco Use   Smoking status: Every Day    Packs/day: 1.00    Years: 50.00    Pack years: 50.00    Types: Cigarettes   Smokeless tobacco: Never  Substance and Sexual Activity   Alcohol use: No   Drug use: No   Sexual activity: Not Currently    Birth control/protection: Surgical  Other Topics Concern   Not on file  Social History Narrative   Not on file   Social Determinants of Health   Financial Resource Strain: Not on file  Food Insecurity: Not on file  Transportation Needs: Not on file  Physical Activity:  Not on file  Stress: Not on file  Social Connections: Not on file  Intimate Partner Violence: Not on file    FAMILY HISTORY:  Family History  Problem Relation Age of Onset   Diabetes Sister    Macular degeneration Sister    Heart attack Mother    Stroke Father    Dementia Father    Cancer Brother    Healthy Sister    Healthy Sister    Diabetes Brother     CURRENT MEDICATIONS:  Current Outpatient Medications  Medication Sig Dispense Refill   ALPRAZolam (XANAX) 0.25 MG tablet Take 0.25 mg by mouth every 8 (eight) hours as needed. (Patient not taking: Reported on 07/20/2020)     diclofenac sodium (VOLTAREN) 1 % GEL Apply 2 g topically 4 (four) times  daily. 100 g 3   dronabinol (MARINOL) 2.5 MG capsule Take 1 capsule (2.5 mg total) by mouth 2 (two) times daily before a meal. 60 capsule 1   lactose free nutrition (BOOST) LIQD Take 1 Container by mouth daily.     MYRBETRIQ 25 MG TB24 tablet Take 25 mg by mouth at bedtime.     NEOMYCIN-POLYMYXIN-HYDROCORTISONE (CORTISPORIN) 1 % SOLN OTIC solution SMARTSIG:In Ear(s)     Polyvinyl Alcohol-Povidone (REFRESH OP) Place 1 drop into both eyes 3 (three) times daily.     No current facility-administered medications for this visit.    ALLERGIES:  Allergies  Allergen Reactions   Codeine Nausea And Vomiting   Prednisone Other (See Comments)    Thought she was having a heart attack   Sulfa Antibiotics Other (See Comments)    unknown   Aleve [Naproxen] Anxiety    Jittery and hot flashes   Latex Rash    PHYSICAL EXAM:  Performance status (ECOG): 1 - Symptomatic but completely ambulatory  Vitals:   08/11/20 0746 08/11/20 0819  BP: (!) 171/70 (!) 166/66  Pulse: 95 90  Resp: 16 16  Temp: 98.1 F (36.7 C) 98.1 F (36.7 C)  SpO2: 98% 100%   Wt Readings from Last 3 Encounters:  07/20/20 100 lb 1.6 oz (45.4 kg)  03/10/20 104 lb 6.4 oz (47.4 kg)  11/11/19 110 lb 9.6 oz (50.2 kg)   Physical Exam Vitals reviewed.  Constitutional:      Appearance: Normal appearance.  Cardiovascular:     Rate and Rhythm: Normal rate and regular rhythm.     Pulses: Normal pulses.     Heart sounds: Normal heart sounds.  Pulmonary:     Effort: Pulmonary effort is normal.     Breath sounds: Normal breath sounds.  Chest:  Breasts:    Right: No axillary adenopathy or supraclavicular adenopathy.     Left: No axillary adenopathy or supraclavicular adenopathy.  Abdominal:     Palpations: There is no hepatomegaly, splenomegaly or mass.     Tenderness: There is no abdominal tenderness.  Lymphadenopathy:     Cervical: No cervical adenopathy.     Right cervical: No superficial cervical adenopathy.    Left  cervical: No superficial cervical adenopathy.     Upper Body:     Right upper body: No supraclavicular or axillary adenopathy.     Left upper body: No supraclavicular or axillary adenopathy.     Lower Body: No right inguinal adenopathy. No left inguinal adenopathy.  Skin:    Comments: Rheumatoid nodules on shins bilaterally  Neurological:     General: No focal deficit present.     Mental Status: She is alert and  oriented to person, place, and time.  Psychiatric:        Mood and Affect: Mood normal.        Behavior: Behavior normal.    LABORATORY DATA:  I have reviewed the labs as listed.  CBC Latest Ref Rng & Units 08/11/2020 07/13/2020 03/03/2020  WBC 4.0 - 10.5 K/uL 13.3(H) 13.4(H) 9.2  Hemoglobin 12.0 - 15.0 g/dL 14.2 13.6 13.0  Hematocrit 36.0 - 46.0 % 42.0 40.3 38.7  Platelets 150 - 400 K/uL 328 297 340   CMP Latest Ref Rng & Units 11/06/2019 04/17/2018 12/11/2016  Glucose 65 - 99 mg/dL - - 96  BUN 6 - 20 mg/dL - - 11  Creatinine 0.44 - 1.00 mg/dL 0.60 0.70 0.76  Sodium 135 - 145 mmol/L - - 134(L)  Potassium 3.5 - 5.1 mmol/L - - 4.0  Chloride 101 - 111 mmol/L - - 101  CO2 22 - 32 mmol/L - - 25  Calcium 8.9 - 10.3 mg/dL - - 9.0  Total Protein 6.5 - 8.1 g/dL - - 7.6  Total Bilirubin 0.3 - 1.2 mg/dL - - 0.4  Alkaline Phos 38 - 126 U/L - - 95  AST 15 - 41 U/L - - 13(L)  ALT 14 - 54 U/L - - 10(L)      Component Value Date/Time   RBC 4.41 08/11/2020 0810   MCV 95.2 08/11/2020 0810   MCH 32.2 08/11/2020 0810   MCHC 33.8 08/11/2020 0810   RDW 17.9 (H) 08/11/2020 0810   LYMPHSABS 12.5 (H) 08/11/2020 0810   MONOABS 0.3 08/11/2020 0810   EOSABS 0.0 08/11/2020 0810   BASOSABS 0.1 08/11/2020 0810    DIAGNOSTIC IMAGING:  I have independently reviewed the scans and discussed with the patient. No results found.   ASSESSMENT:  1.  Lymphocytic leukocytosis: -CBC on 10-13-2019 shows white count 13.5, with differential showing 2% neutrophils, 94% lymphocytes, 3% monocytes and 1%  basophils. -Hemoglobin and platelet count were normal. -She had history of splenectomy at age 31 secondary to a cyst on it. -She reported 5 to 10 pound weight loss in the last 6 months but denies any fevers or night sweats. -Reports decreased appetite and decreased energy levels for the last 5 to 6 weeks. -CT CAP on 11/06/2019 showed a borderline prominent right axillary lymph node, 1.4 cm right upper normal-sized left axillary and right subpectoral lymph nodes with no overtly pathologic adenopathy.  Mild chronic diffuse prominence of thyroid gland without discrete nodule. - T-cell receptor gene rearrangement showed clonal T-cell gamma population as well as clonal T-cell beta population detected.  This usually reflects presence of T-cell lymphocytic neoplasm.   2.  Social/family history: -She lives at home with her nephew.  She did office work prior to retirement. -She is a current active smoker, 1 pack/day for 50+ years. -Brother had cancer, type unknown.   3.  Rheumatoid arthritis: -She has rheumatoid arthritis of the extremities, predominantly in the upper extremities with rheumatoid nodules. -Uses Voltaren and aspirin cream.   PLAN:  1.  Lymphocytic leukocytosis: - She does not report any B symptoms. - Reviewed CBC from today which showed white count 13.3.  Hemoglobin and platelets were normal.  Differential shows 1% neutrophils and 95% lymphocytes. - Absolute neutrophil count is 100. - I have recommended bone marrow aspiration and biopsy for further evaluation. - Previous T-cell receptor gene rearrangement showed clonal T-cell gamma population as well as clonal T-cell beta population. - We discussed bone marrow biopsy and rare  complications including bleeding and infection.  She will proceed with it today.  We will see her back in 2 weeks after the biopsy.   2.  Weight loss: - CT CAP on 11/06/2019 did not show any clear etiology for weight loss. - Continue Marinol 2.5 mg twice  daily.  Orders placed this encounter:  Orders Placed This Encounter  Procedures   CBC with Differential/Platelet      Derek Jack, MD [place] [phone]   I, Sheri Pope, am acting as a scribe for Dr. Derek Jack.  I, Derek Jack MD, have reviewed the above documentation for accuracy and completeness, and I agree with the above.

## 2020-08-11 NOTE — Progress Notes (Signed)
Patient here today for bone marrow biopsy. Procedure explained and consent signed by all parties at [place]. Patient placed in prone position with both arms above head (0742 any additional positioning notes ). Time out conducted at 0749 ,patient and team agreed. Procedure started at 0751. Patient tolerated procedure well with minimal pain and discomfort. Specimens collected and labeled appropriately. Procedure completed at [place]. Dressing applied and patient reposition on back, head of bed elevated, resting at 0805. Specimens taken to lab for processing. Patient remained stable during procedure and discharged home in stable condition ambulatory at [facility].

## 2020-08-12 NOTE — Progress Notes (Signed)
CRITICAL VALUE STICKER  CRITICAL VALUE:  ANC 0.1  RECEIVER (on-site recipient of call):  A. Ouida Sills, RN  DATE & TIME NOTIFIED: 08/11/2020 at 0853  MD NOTIFIED: Delton Coombes  RESPONSE:  f/u as scheduled

## 2020-08-13 LAB — SURGICAL PATHOLOGY

## 2020-08-14 ENCOUNTER — Ambulatory Visit
Admission: EM | Admit: 2020-08-14 | Discharge: 2020-08-14 | Disposition: A | Discharge status: Home / Self Care | Payer: Medicare HMO | Attending: Emergency Medicine | Admitting: Emergency Medicine

## 2020-08-14 ENCOUNTER — Other Ambulatory Visit: Payer: Self-pay

## 2020-08-14 DIAGNOSIS — L03125 Acute lymphangitis of right lower limb: Secondary | ICD-10-CM

## 2020-08-14 DIAGNOSIS — L089 Local infection of the skin and subcutaneous tissue, unspecified: Secondary | ICD-10-CM

## 2020-08-14 DIAGNOSIS — S91331A Puncture wound without foreign body, right foot, initial encounter: Secondary | ICD-10-CM

## 2020-08-14 MED ORDER — DOXYCYCLINE HYCLATE 100 MG PO CAPS: 100.0000 mg | ORAL_CAPSULE | Freq: Two times a day (BID) | ORAL | 0 refills | Status: DC

## 2020-08-14 MED ORDER — CEFTRIAXONE SODIUM 500 MG IJ SOLR
500.0000 mg | Freq: Once | INTRAMUSCULAR | Status: AC
  Administered 2020-08-14: 500 mg via INTRAMUSCULAR

## 2020-08-14 NOTE — ED Triage Notes (Signed)
Pt presents with right foot infection after stepping on object  2 days ago

## 2020-08-14 NOTE — Discharge Instructions (Addendum)
Wash site daily with warm water and mild soap Keep covered to avoid friction Antibiotic shot given in office Take antibiotic as prescribed and to completion (patient requests doxycycline because she tolerated it well last time) Follow up here or with PCP if symptoms persists Return or go to the ED if you have any new or worsening symptoms increased redness, swelling, pain, nausea, vomiting, fever, chills, etc..Marland Kitchen

## 2020-08-14 NOTE — ED Provider Notes (Signed)
[facility]   [id number] 08/14/20 Arrival Time: 0808   CC: puncture wound foot  SUBJECTIVE:  Sheri Pope is a 80 y.o. female who presents with a puncture wound to bottom of RT foot x few days.  Stepped on something in her shoe, and removed at home.  Unsure what she stepped on.  Reports redness, swelling, and red streaking up RT foot.  Has tried a few doses of left over doxy without noticeable relief.  Sore to the touch.  Reports similar symptoms with wound infection to RT arm.  Treated with doxycycline with relief.  Complains of associated chills and fatigue  Denies fever, nausea, and vomiting.    Patient had bone marrow biopsy earlier this week and told white count was low.    ROS: As per HPI.  All other pertinent ROS negative.     Past Medical History:  Diagnosis Date   Arthritis    RA AND OA --PAIN AND SWELLIN IN LEFT KNEE   Blood in urine    NEGATIVE UROLOGY WORK UP --BUT ALWAYS HAS BLOOD IN URINE   GERD (gastroesophageal reflux disease)    H/O hiatal hernia    Hyperlipidemia    Leukemia (HCC)    PONV (postoperative nausea and vomiting)    ALWAYS SICK AFTER SURGERIES EXCEPT AFTER HIP REPLACMENT 2007   Vitamin D deficiency    Past Surgical History:  Procedure Laterality Date   ABDOMINAL HYSTERECTOMY  1870'S   BREAST SURGERY     BREAST BIOSPIES   CATARACT EXTRACTION W/PHACO Left 05/30/2012   Procedure: CATARACT EXTRACTION PHACO AND INTRAOCULAR LENS PLACEMENT ([place]);  Surgeon: Tonny Branch, MD;  Location: [facility];  Service: Ophthalmology;  Laterality: Left;  CDE: 16.99   CATARACT EXTRACTION W/PHACO Right 06/24/2012   Procedure: CATARACT EXTRACTION PHACO AND INTRAOCULAR LENS PLACEMENT ([facility]);  Surgeon: Tonny Branch, MD;  Location: [facility];  Service: Ophthalmology;  Laterality: Right;  CDE:17.92   HIP SURGERY     JOINT REPLACEMENT  2007   RIGHT HIP REPLACEMENT   LEFT KNEE ARTHROSCOPY  AUG 2012   MULTIPLE ORTHOPEDIC SURGERIES     ON BOTH HANDS AND BOTH FEET, RIGHT ELBOW    SPLEENECTOMY  IN THE 70'S   FOR LARGE CYST   TOTAL KNEE ARTHROPLASTY  11/10/2011   Procedure: TOTAL KNEE ARTHROPLASTY;  Surgeon: Mcarthur Rossetti, MD;  Location: [facility];  Service: Orthopedics;  Laterality: Left;  Left Total Knee Arthroplasty   YAG LASER APPLICATION Bilateral    Dr. Geoffry Paradise   Allergies  Allergen Reactions   Codeine Nausea And Vomiting   Prednisone Other (See Comments)    Thought she was having a heart attack   Sulfa Antibiotics Other (See Comments)    unknown   Aleve [Naproxen] Anxiety    Jittery and hot flashes   Latex Rash   No current facility-administered medications on file prior to encounter.   Current Outpatient Medications on File Prior to Encounter  Medication Sig Dispense Refill   ALPRAZolam (XANAX) 0.25 MG tablet Take 0.25 mg by mouth every 8 (eight) hours as needed. (Patient not taking: Reported on 07/20/2020)     diclofenac sodium (VOLTAREN) 1 % GEL Apply 2 g topically 4 (four) times daily. 100 g 3   dronabinol (MARINOL) 2.5 MG capsule Take 1 capsule (2.5 mg total) by mouth 2 (two) times daily before a meal. 60 capsule 1   lactose free nutrition (BOOST) LIQD Take 1 Container by mouth daily.     MYRBETRIQ  25 MG TB24 tablet Take 25 mg by mouth at bedtime.     NEOMYCIN-POLYMYXIN-HYDROCORTISONE (CORTISPORIN) 1 % SOLN OTIC solution SMARTSIG:In Ear(s)     Polyvinyl Alcohol-Povidone (REFRESH OP) Place 1 drop into both eyes 3 (three) times daily.     Social History   Socioeconomic History   Marital status: Widowed    Spouse name: Not on file   Number of children: Not on file   Years of education: Not on file   Highest education level: Not on file  Occupational History   Occupation: retired  Tobacco Use   Smoking status: Every Day    Packs/day: 1.00    Years: 50.00    Pack years: 50.00    Types: Cigarettes   Smokeless tobacco: Never  Substance and Sexual Activity   Alcohol use: No   Drug use: No   Sexual activity: Not Currently    Birth  control/protection: Surgical  Other Topics Concern   Not on file  Social History Narrative   Not on file   Social Determinants of Health   Financial Resource Strain: Not on file  Food Insecurity: Not on file  Transportation Needs: Not on file  Physical Activity: Not on file  Stress: Not on file  Social Connections: Not on file  Intimate Partner Violence: Not on file   Family History  Problem Relation Age of Onset   Diabetes Sister    Macular degeneration Sister    Heart attack Mother    Stroke Father    Dementia Father    Cancer Brother    Healthy Sister    Healthy Sister    Diabetes Brother     OBJECTIVE:  Vitals:   08/14/20 0822  BP: 125/73  Pulse: (!) 107  Resp: 16  Temp: 98.2 F (36.8 C)  TempSrc: Tympanic  SpO2: 92%    General appearance: alert; no distress CV: dorsalis pedis pulse 2+ Skin: pustule/ papule to heel of RT foot, tender to touch; no active drainage, erythematous streak to inner foot towards RT lower leg Psychological: alert and cooperative; normal mood and affect  ASSESSMENT & PLAN:  1. Puncture wound of plantar aspect of right foot with infection, initial encounter   2. Acute lymphangitis of right foot     Meds ordered this encounter  Medications   doxycycline (VIBRAMYCIN) 100 MG capsule    Sig: Take 1 capsule (100 mg total) by mouth 2 (two) times daily.    Dispense:  20 capsule    Refill:  0    Order Specific Question:   Supervising Provider    Answer:   Raylene Everts [[id number]]   cefTRIAXone (ROCEPHIN) injection 500 mg    Wash site daily with warm water and mild soap Keep covered to avoid friction Antibiotic shot given in office Take antibiotic as prescribed and to completion (patient requests doxycycline because she tolerated it well last time) Follow up here or with PCP if symptoms persists Return or go to the ED if you have any new or worsening symptoms increased redness, swelling, pain, nausea, vomiting, fever, chills,  etc...   Reviewed expectations re: course of current medical issues. Questions answered. Outlined signs and symptoms indicating need for more acute intervention. Patient verbalized understanding. After Visit Summary given.           Lestine Box, PA-C 08/14/20 [phone]

## 2020-08-18 ENCOUNTER — Encounter (HOSPITAL_COMMUNITY): Payer: Self-pay | Admitting: Hematology

## 2020-08-19 LAB — SURGICAL PATHOLOGY

## 2020-08-21 ENCOUNTER — Encounter (HOSPITAL_COMMUNITY): Payer: Self-pay | Admitting: *Deleted

## 2020-08-21 ENCOUNTER — Other Ambulatory Visit: Payer: Self-pay

## 2020-08-21 ENCOUNTER — Emergency Department (HOSPITAL_COMMUNITY): Payer: Medicare HMO

## 2020-08-21 ENCOUNTER — Observation Stay (HOSPITAL_COMMUNITY)
Admission: EM | Admit: 2020-08-21 | Discharge: 2020-08-22 | Disposition: A | Discharge status: Home / Self Care | Payer: Medicare HMO | Attending: Emergency Medicine | Admitting: Emergency Medicine

## 2020-08-21 DIAGNOSIS — Z79899 Other long term (current) drug therapy: Secondary | ICD-10-CM | POA: Diagnosis not present

## 2020-08-21 DIAGNOSIS — K922 Gastrointestinal hemorrhage, unspecified: Secondary | ICD-10-CM | POA: Diagnosis not present

## 2020-08-21 DIAGNOSIS — R1013 Epigastric pain: Secondary | ICD-10-CM | POA: Diagnosis not present

## 2020-08-21 DIAGNOSIS — Z96641 Presence of right artificial hip joint: Secondary | ICD-10-CM | POA: Insufficient documentation

## 2020-08-21 DIAGNOSIS — Z20822 Contact with and (suspected) exposure to covid-19: Secondary | ICD-10-CM | POA: Insufficient documentation

## 2020-08-21 DIAGNOSIS — Z91040 Latex allergy status: Secondary | ICD-10-CM | POA: Insufficient documentation

## 2020-08-21 DIAGNOSIS — K625 Hemorrhage of anus and rectum: Principal | ICD-10-CM | POA: Diagnosis present

## 2020-08-21 DIAGNOSIS — F17210 Nicotine dependence, cigarettes, uncomplicated: Secondary | ICD-10-CM | POA: Diagnosis not present

## 2020-08-21 DIAGNOSIS — K921 Melena: Secondary | ICD-10-CM | POA: Diagnosis not present

## 2020-08-21 DIAGNOSIS — Z96652 Presence of left artificial knee joint: Secondary | ICD-10-CM | POA: Diagnosis not present

## 2020-08-21 DIAGNOSIS — M069 Rheumatoid arthritis, unspecified: Secondary | ICD-10-CM | POA: Diagnosis present

## 2020-08-21 LAB — COMPREHENSIVE METABOLIC PANEL
ALT: 17 U/L (ref 0–44)
AST: 17 U/L (ref 15–41)
Albumin: 3 g/dL — ABNORMAL LOW (ref 3.5–5.0)
Alkaline Phosphatase: 92 U/L (ref 38–126)
Anion gap: 6 (ref 5–15)
BUN: 13 mg/dL (ref 8–23)
CO2: 25 mmol/L (ref 22–32)
Calcium: 9 mg/dL (ref 8.9–10.3)
Chloride: 98 mmol/L (ref 98–111)
Creatinine, Ser: 0.55 mg/dL (ref 0.44–1.00)
GFR, Estimated: >60 mL/min (ref >60)
Glucose, Bld: 93 mg/dL (ref 70–99)
Potassium: 3.9 mmol/L (ref 3.5–5.1)
Sodium: 129 mmol/L — ABNORMAL LOW (ref 135–145)
Total Bilirubin: 0.4 mg/dL (ref 0.3–1.2)
Total Protein: 7.8 g/dL (ref 6.5–8.1)

## 2020-08-21 LAB — CBC
HCT: 40.1 % (ref 36.0–46.0)
Hemoglobin: 13.7 g/dL (ref 12.0–15.0)
MCH: 32.4 pg (ref 26.0–34.0)
MCHC: 34.2 g/dL (ref 30.0–36.0)
MCV: 94.8 fL (ref 80.0–100.0)
Platelets: 288 K/uL (ref 150–400)
RBC: 4.23 MIL/uL (ref 3.87–5.11)
RDW: 18.4 % — ABNORMAL HIGH (ref 11.5–15.5)
WBC: 12 K/uL — ABNORMAL HIGH (ref 4.0–10.5)
nRBC: 0.2 % (ref 0.0–0.2)

## 2020-08-21 LAB — TYPE AND SCREEN
ABO/RH(D): AB POS
Antibody Screen: NEGATIVE

## 2020-08-21 LAB — RESP PANEL BY RT-PCR (FLU A&B, COVID) ARPGX2
Influenza A by PCR: NEGATIVE
Influenza B by PCR: NEGATIVE
SARS Coronavirus 2 by RT PCR: NEGATIVE

## 2020-08-21 LAB — POC OCCULT BLOOD, ED: Fecal Occult Bld: POSITIVE — AB

## 2020-08-21 MED ORDER — SODIUM CHLORIDE 0.9 % IV SOLN: 250.0000 mL | INTRAVENOUS | Status: DC | PRN

## 2020-08-21 MED ORDER — PANTOPRAZOLE SODIUM 40 MG IV SOLR
40.0000 mg | Freq: Two times a day (BID) | INTRAVENOUS | Status: DC
  Administered 2020-08-21 – 2020-08-22 (×2): 40 mg via INTRAVENOUS
  Filled 2020-08-21 (×2): qty 40

## 2020-08-21 MED ORDER — HEPARIN SODIUM (PORCINE) 5000 UNIT/ML IJ SOLN: 5000.0000 [IU] | Freq: Three times a day (TID) | INTRAMUSCULAR | Status: DC

## 2020-08-21 MED ORDER — POLYETHYLENE GLYCOL 3350 17 G PO PACK
17.0000 g | PACK | Freq: Every day | ORAL | Status: DC
  Filled 2020-08-21: qty 1

## 2020-08-21 MED ORDER — SODIUM CHLORIDE 0.9 % IV BOLUS
500.0000 mL | Freq: Once | INTRAVENOUS | Status: AC
  Administered 2020-08-21: 500 mL via INTRAVENOUS

## 2020-08-21 MED ORDER — PANTOPRAZOLE SODIUM 40 MG IV SOLR
40.0000 mg | Freq: Once | INTRAVENOUS | Status: AC
  Administered 2020-08-21: 40 mg via INTRAVENOUS
  Filled 2020-08-21: qty 40

## 2020-08-21 MED ORDER — ACETAMINOPHEN 325 MG PO TABS: 650.0000 mg | ORAL_TABLET | Freq: Four times a day (QID) | ORAL | Status: DC | PRN

## 2020-08-21 MED ORDER — BISACODYL 10 MG RE SUPP: 10.0000 mg | Freq: Every day | RECTAL | Status: DC | PRN

## 2020-08-21 MED ORDER — HYDROCODONE-ACETAMINOPHEN 5-325 MG PO TABS
1.0000 | ORAL_TABLET | Freq: Four times a day (QID) | ORAL | Status: DC | PRN
  Administered 2020-08-21 – 2020-08-22 (×2): 1 via ORAL
  Filled 2020-08-21 (×2): qty 1

## 2020-08-21 MED ORDER — TRAZODONE HCL 50 MG PO TABS: 50.0000 mg | ORAL_TABLET | Freq: Every evening | ORAL | Status: DC | PRN

## 2020-08-21 MED ORDER — IOHEXOL 300 MG/ML  SOLN
75.0000 mL | Freq: Once | INTRAMUSCULAR | Status: AC | PRN
  Administered 2020-08-21: 75 mL via INTRAVENOUS

## 2020-08-21 MED ORDER — ACETAMINOPHEN 650 MG RE SUPP: 650.0000 mg | Freq: Four times a day (QID) | RECTAL | Status: DC | PRN

## 2020-08-21 MED ORDER — ONDANSETRON HCL 4 MG PO TABS: 4.0000 mg | ORAL_TABLET | Freq: Four times a day (QID) | ORAL | Status: DC | PRN

## 2020-08-21 MED ORDER — NICOTINE 21 MG/24HR TD PT24
21.0000 mg | MEDICATED_PATCH | Freq: Every day | TRANSDERMAL | Status: DC
  Administered 2020-08-21 – 2020-08-22 (×2): 21 mg via TRANSDERMAL
  Filled 2020-08-21 (×2): qty 1

## 2020-08-21 MED ORDER — METHOCARBAMOL 500 MG PO TABS
500.0000 mg | ORAL_TABLET | Freq: Four times a day (QID) | ORAL | Status: DC | PRN
  Administered 2020-08-21 – 2020-08-22 (×2): 500 mg via ORAL
  Filled 2020-08-21 (×2): qty 1

## 2020-08-21 MED ORDER — ALPRAZOLAM 0.25 MG PO TABS
0.2500 mg | ORAL_TABLET | Freq: Every evening | ORAL | Status: DC | PRN
  Administered 2020-08-21: 0.25 mg via ORAL
  Filled 2020-08-21: qty 1

## 2020-08-21 MED ORDER — DICLOFENAC SODIUM 1 % TD GEL
2.0000 g | Freq: Four times a day (QID) | TRANSDERMAL | Status: DC
  Administered 2020-08-21 – 2020-08-22 (×2): 2 g via TOPICAL
  Filled 2020-08-21 (×2): qty 100

## 2020-08-21 MED ORDER — BOOST PO LIQD
1.0000 | Freq: Every day | ORAL | Status: DC
  Filled 2020-08-21 (×2): qty 237

## 2020-08-21 MED ORDER — SODIUM CHLORIDE 0.9% FLUSH
3.0000 mL | Freq: Two times a day (BID) | INTRAVENOUS | Status: DC
  Administered 2020-08-21: 3 mL via INTRAVENOUS

## 2020-08-21 MED ORDER — LABETALOL HCL 5 MG/ML IV SOLN: 10.0000 mg | INTRAVENOUS | Status: DC | PRN

## 2020-08-21 MED ORDER — ONDANSETRON HCL 4 MG/2ML IJ SOLN: 4.0000 mg | Freq: Four times a day (QID) | INTRAMUSCULAR | Status: DC | PRN

## 2020-08-21 MED ORDER — SODIUM CHLORIDE 0.9% FLUSH: 3.0000 mL | INTRAVENOUS | Status: DC | PRN

## 2020-08-21 MED ORDER — HYDROCORTISONE ACETATE 25 MG RE SUPP
25.0000 mg | Freq: Two times a day (BID) | RECTAL | Status: DC
  Administered 2020-08-21 – 2020-08-22 (×2): 25 mg via RECTAL
  Filled 2020-08-21 (×2): qty 1

## 2020-08-21 MED ORDER — SODIUM CHLORIDE 0.9 % IV SOLN
INTRAVENOUS | Status: DC
  Administered 2020-08-21: 75 mL/h via INTRAVENOUS

## 2020-08-21 NOTE — H&P (Signed)
Patient Demographics:    Sheri Pope, is a 80 y.o. female  MRN: [id number]   DOB - [birth year]  Admit Date - 08/21/2020  Outpatient Primary MD for the patient is Sheri Pope, Sheri Areola, MD   Assessment & Plan:    Principal Problem:   Rectal bleeding Active Problems:   Rheumatoid arthritis ([place])   1)Rectal Bleeding--- BRBPR--- ?? Hemorrhoids Vs Polyp Vs Tumor--patient had a sigmoidoscopy more than 30 years ago no prior colonoscopy -EDP discussed case with Dr. Gala Pope who will see patient in a.m. -Monitor H&H -Anusol HC as prescribed -Protonix as ordered clear liquid diet for now  2)Rheumatoid Arthritis/OA--patient with significant joint deformities continue as needed hydrocodone along with methocarbamol  3)Anxiety disorder--- Xanax as needed  4) tobacco abuse--- smoking cessation advised   disposition/Need for in-Hospital Stay- patient unable to be discharged at this time due to --bright red blood per rectum awaiting abdominal evaluation by GI physician   Dispo: The patient is from: Home              Anticipated d/c is to: Home              Anticipated d/c date is: 1 day              Patient currently is not medically stable to d/c. Barriers: Not Clinically Stable-    With History of - Reviewed by me  Past Medical History:  Diagnosis Date   Arthritis    RA AND OA --PAIN AND SWELLIN IN LEFT KNEE   Blood in urine    NEGATIVE UROLOGY WORK UP --BUT ALWAYS HAS BLOOD IN URINE   GERD (gastroesophageal reflux disease)    H/O hiatal hernia    Hyperlipidemia    Leukemia ([place])    PONV (postoperative nausea and vomiting)    ALWAYS SICK AFTER SURGERIES EXCEPT AFTER HIP REPLACMENT 2007   Vitamin D deficiency       Past Surgical History:  Procedure Laterality Date   ABDOMINAL HYSTERECTOMY  1870'S   BREAST  SURGERY     BREAST BIOSPIES   CATARACT EXTRACTION W/PHACO Left 05/30/2012   Procedure: CATARACT EXTRACTION PHACO AND INTRAOCULAR LENS PLACEMENT ([place]);  Surgeon: Sheri Branch, MD;  Location: [facility];  Service: Ophthalmology;  Laterality: Left;  CDE: 16.99   CATARACT EXTRACTION W/PHACO Right 06/24/2012   Procedure: CATARACT EXTRACTION PHACO AND INTRAOCULAR LENS PLACEMENT ([facility]);  Surgeon: Sheri Branch, MD;  Location: [facility];  Service: Ophthalmology;  Laterality: Right;  CDE:17.92   HIP SURGERY     JOINT REPLACEMENT  2007   RIGHT HIP REPLACEMENT   LEFT KNEE ARTHROSCOPY  AUG 2012   MULTIPLE ORTHOPEDIC SURGERIES     ON BOTH HANDS AND BOTH FEET, RIGHT ELBOW   SPLEENECTOMY  IN THE 70'S   FOR LARGE CYST   TOTAL KNEE ARTHROPLASTY  11/10/2011   Procedure: TOTAL KNEE ARTHROPLASTY;  Surgeon: Sheri Rossetti, MD;  Location: [facility];  Service: Orthopedics;  Laterality: Left;  Left Total Knee Arthroplasty   YAG LASER APPLICATION Bilateral    Dr. Geoffry Pope      Chief Complaint  Patient presents with   Rectal Bleeding      HPI:    Sheri Pope  is a 81 y.o. female smoker with past medical history relevant for severe/advanced rheumatoid arthritis, osteoarthritis, HLD and history of leukemia who presents to the ED with bright red blood per rectum since 08/20/2020--patient had 4 episodes of bright red blood per rectum on 08/20/2020 with clots and additional 2 episodes on 08/21/2020--- there were painless -No vomiting, denies abdominal pain No fever  Or chills  No chest pain, no palpitations no dizziness -Additional history obtained from patient's niece Sheri Pope at bedside -Patient had sigmoidoscopy more than 30 years ago, no prior colonoscopy --EDP exam reveals hemorrhoids   Review of systems:    In addition to the HPI above,   A full Review of  Systems was done, all other systems reviewed are negative except as noted above in HPI , .    Social History:  Reviewed by me    Social History   Tobacco  Use   Smoking status: Every Day    Packs/day: 1.00    Years: 50.00    Pack years: 50.00    Types: Cigarettes   Smokeless tobacco: Never  Substance Use Topics   Alcohol use: No       Family History :  Reviewed by me    Family History  Problem Relation Age of Onset   Diabetes Sister    Macular degeneration Sister    Heart attack Mother    Stroke Father    Dementia Father    Cancer Brother    Healthy Sister    Healthy Sister    Diabetes Brother      Home Medications:   Prior to Admission medications   Medication Sig Start Date End Date Taking? Authorizing Provider  ALPRAZolam (XANAX) 0.25 MG tablet Take 0.25 mg by mouth at bedtime. Take 1/2 tablet every evening 06/14/20  Yes [provider]  diclofenac sodium (VOLTAREN) 1 % GEL Apply 2 g topically 4 (four) times daily. 12/19/17  Yes Sheri Rossetti, MD  doxycycline (VIBRAMYCIN) 100 MG capsule Take 1 capsule (100 mg total) by mouth 2 (two) times daily. 08/14/20  Yes [place], [place], PA-C  HYDROcodone-acetaminophen (NORCO/VICODIN) 5-325 MG tablet Take 0.5-1 tablets by mouth every 6 (six) hours as needed. 08/03/20  Yes [provider]  lactose free nutrition (BOOST) LIQD Take 1 Container by mouth daily.   Yes [provider]  methocarbamol (ROBAXIN) 500 MG tablet Take 1 tablet by mouth every 6 (six) hours as needed. 07/28/20  Yes [provider]  Polyvinyl Alcohol-Povidone (REFRESH OP) Place 1 drop into both eyes 3 (three) times daily.   Yes [provider]  dronabinol (MARINOL) 2.5 MG capsule Take 1 capsule (2.5 mg total) by mouth 2 (two) times daily before a meal. Patient not taking: No sig reported 07/20/20   Derek Jack, MD  MYRBETRIQ 25 MG TB24 tablet Take 25 mg by mouth at bedtime. Patient not taking: No sig reported 03/17/20   [provider]  NEOMYCIN-POLYMYXIN-HYDROCORTISONE (CORTISPORIN) 1 % SOLN OTIC solution SMARTSIG:In Ear(s) Patient not taking: No sig  reported 03/17/20   [provider]     Allergies:     Allergies  Allergen Reactions   Codeine Nausea And Vomiting   Prednisone Other (See Comments)  Thought she was having a heart attack   Sulfa Antibiotics Other (See Comments)    unknown   Aleve [Naproxen] Anxiety    Jittery and hot flashes   Latex Rash     Physical Exam:   Vitals  Blood pressure (!) 161/74, pulse 79, temperature 97.9 F (36.6 C), temperature source Oral, resp. rate 18, height 5\' 1"  (1.549 m), weight 44.3 kg, SpO2 98 %.  Physical Examination: General appearance - alert,  , and in no distress  Mental status - alert, oriented to person, place, and time,  Eyes - sclera anicteric Neck - supple, no JVD elevation , Chest - clear  to auscultation bilaterally, symmetrical air movement,  Heart - S1 and S2 normal, regular  Abdomen - soft, nontender, nondistended, no masses or organomegaly Neurological - screening mental status exam normal, neck supple without rigidity, cranial nerves II through XII intact, DTR's normal and symmetric Extremities - no pedal edema noted, intact peripheral pulses  Skin - warm, dry Rectum--rectal exam already performed by EDP I did not repeat by EDP rectal exam reveals "Small nonthrombosed external hemorrhoid noted.  Additional palpable internal hemorrhoid noted.  Small amounts of brown stool with traces of blood noted in the rectal vault.  No tenderness appreciated throughout the exam"     Data Review:    CBC Recent Labs  Lab 08/21/20 1503  WBC 12.0*  HGB 13.7  HCT 40.1  PLT 288  MCV 94.8  MCH 32.4  MCHC 34.2  RDW 18.4*   ------------------------------------------------------------------------------------------------------------------  Chemistries  Recent Labs  Lab 08/21/20 1503  NA 129*  K 3.9  CL 98  CO2 25  GLUCOSE 93  BUN 13  CREATININE 0.55  CALCIUM 9.0  AST 17  ALT 17  ALKPHOS 92  BILITOT 0.4    ------------------------------------------------------------------------------------------------------------------ estimated creatinine clearance is 41.2 mL/min (by C-G formula based on SCr of 0.55 mg/dL). ------------------------------------------------------------------------------------------------------------------ No results for input(s): TSH, T4TOTAL, T3FREE, THYROIDAB in the last 72 hours.  Invalid input(s): FREET3   Coagulation profile No results for input(s): INR, PROTIME in the last 168 hours. ------------------------------------------------------------------------------------------------------------------- No results for input(s): DDIMER in the last 72 hours. -------------------------------------------------------------------------------------------------------------------  Cardiac Enzymes No results for input(s): CKMB, TROPONINI, MYOGLOBIN in the last 168 hours.  Invalid input(s): CK ------------------------------------------------------------------------------------------------------------------ No results found for: BNP   ---------------------------------------------------------------------------------------------------------------  Urinalysis    Component Value Date/Time   COLORURINE YELLOW 11/03/2011 [place] 11/03/2011 1012   LABSPEC 1.014 11/03/2011 1012   PHURINE 5.0 11/03/2011 1012   GLUCOSEU NEGATIVE 11/03/2011 1012   HGBUR MODERATE (A) 11/03/2011 1012   BILIRUBINUR negative 03/01/2019 1207   KETONESUR negative 03/01/2019 [place] 11/03/2011 1012   PROTEINUR negative 03/01/2019 1207   PROTEINUR NEGATIVE 11/03/2011 1012   UROBILINOGEN 0.2 03/01/2019 1207   UROBILINOGEN 0.2 11/03/2011 1012   NITRITE Negative 03/01/2019 1207   NITRITE NEGATIVE 11/03/2011 1012   LEUKOCYTESUR Negative 03/01/2019 1207    ----------------------------------------------------------------------------------------------------------------    Imaging Results:    CT ABDOMEN PELVIS W CONTRAST  Result Date: 08/21/2020 CLINICAL DATA:  [facility] GI bleeding and epigastric pain. Blood in stool x 2 days. Hx of leukemia, hiatal hernia, hysterectomy, splenectomy. No oral per provider. Denies pain, N/V/D. EXAM: CT ABDOMEN AND PELVIS WITH CONTRAST TECHNIQUE: Multidetector CT imaging of the abdomen and pelvis was performed using the standard protocol following bolus administration of intravenous contrast. CONTRAST:  13mL OMNIPAQUE IOHEXOL 300 MG/ML  SOLN COMPARISON:  CT abdomen pelvis 11/06/2019, CT chest 12/01/2004  FINDINGS: Lower chest: No acute abnormality. Hepatobiliary: Stable 1 cm per peripheral left hepatic lobe lesion that is too small to characterize. No focal liver abnormality. No gallstones, gallbladder wall thickening, or pericholecystic fluid. No biliary dilatation. Pancreas: No focal lesion. Normal pancreatic contour. No surrounding inflammatory changes. No main pancreatic ductal dilatation. Spleen: Status post splenectomy. Left upper quadrant 3 cm soft tissue density suggestive of splenosis. Adrenals/Urinary Tract: No adrenal nodule bilaterally. Bilateral kidneys enhance symmetrically. No hydronephrosis. No hydroureter. The urinary bladder is unremarkable. Stomach/Bowel: Stomach is within normal limits. No evidence of bowel wall thickening or dilatation. Mobile medialized cecum. Scattered colonic diverticulosis. Appendix not definitely identified with no evidence of inflammatory changes to suggest acute appendicitis. Vascular/Lymphatic: No abdominal aorta or iliac aneurysm. Severe atherosclerotic plaque of the aorta and its branches. No abdominal, pelvic, or inguinal lymphadenopathy. Reproductive: Limited evaluation of the pelvis due to streak artifact originating from a right hip arthroplasty. Status post hysterectomy. No adnexal masses. Other: No intraperitoneal free fluid. No intraperitoneal free gas. No organized fluid collection.  Musculoskeletal: No abdominal wall hernia or abnormality. No suspicious lytic or blastic osseous lesions. No acute displaced fracture. Multilevel degenerative changes of the spine. Status post partially visualized total right hip arthroplasty. IMPRESSION: 1. No acute intra-abdominal or intrapelvic abnormality on this single phase portal venous study. 2. Scattered colonic diverticulosis with no acute diverticulitis. 3. Similar in size indeterminate 1 cm left hepatic lesion. Given chronicity, finding likely benign. Findings suggestive of left upper quadrant splenosis in a patient status post splenectomy. 4.  Aortic Atherosclerosis (ICD10-I70.0). Electronically Signed   By: Iven Finn M.D.   On: 08/21/2020 17:15    Radiological Exams on Admission: CT ABDOMEN PELVIS W CONTRAST  Result Date: 08/21/2020 CLINICAL DATA:  [facility] GI bleeding and epigastric pain. Blood in stool x 2 days. Hx of leukemia, hiatal hernia, hysterectomy, splenectomy. No oral per provider. Denies pain, N/V/D. EXAM: CT ABDOMEN AND PELVIS WITH CONTRAST TECHNIQUE: Multidetector CT imaging of the abdomen and pelvis was performed using the standard protocol following bolus administration of intravenous contrast. CONTRAST:  44mL OMNIPAQUE IOHEXOL 300 MG/ML  SOLN COMPARISON:  CT abdomen pelvis 11/06/2019, CT chest 12/01/2004 FINDINGS: Lower chest: No acute abnormality. Hepatobiliary: Stable 1 cm per peripheral left hepatic lobe lesion that is too small to characterize. No focal liver abnormality. No gallstones, gallbladder wall thickening, or pericholecystic fluid. No biliary dilatation. Pancreas: No focal lesion. Normal pancreatic contour. No surrounding inflammatory changes. No main pancreatic ductal dilatation. Spleen: Status post splenectomy. Left upper quadrant 3 cm soft tissue density suggestive of splenosis. Adrenals/Urinary Tract: No adrenal nodule bilaterally. Bilateral kidneys enhance symmetrically. No hydronephrosis. No hydroureter. The  urinary bladder is unremarkable. Stomach/Bowel: Stomach is within normal limits. No evidence of bowel wall thickening or dilatation. Mobile medialized cecum. Scattered colonic diverticulosis. Appendix not definitely identified with no evidence of inflammatory changes to suggest acute appendicitis. Vascular/Lymphatic: No abdominal aorta or iliac aneurysm. Severe atherosclerotic plaque of the aorta and its branches. No abdominal, pelvic, or inguinal lymphadenopathy. Reproductive: Limited evaluation of the pelvis due to streak artifact originating from a right hip arthroplasty. Status post hysterectomy. No adnexal masses. Other: No intraperitoneal free fluid. No intraperitoneal free gas. No organized fluid collection. Musculoskeletal: No abdominal wall hernia or abnormality. No suspicious lytic or blastic osseous lesions. No acute displaced fracture. Multilevel degenerative changes of the spine. Status post partially visualized total right hip arthroplasty. IMPRESSION: 1. No acute intra-abdominal or intrapelvic abnormality on this single phase portal venous study. 2. Scattered  colonic diverticulosis with no acute diverticulitis. 3. Similar in size indeterminate 1 cm left hepatic lesion. Given chronicity, finding likely benign. Findings suggestive of left upper quadrant splenosis in a patient status post splenectomy. 4.  Aortic Atherosclerosis (ICD10-I70.0). Electronically Signed   By: Iven Finn M.D.   On: 08/21/2020 17:15    DVT Prophylaxis -SCD  AM Labs Ordered, also please review Full Orders  Family Communication: Admission, patients condition and plan of care including tests being ordered have been discussed with the patient and Niece Sheri Pope who indicate understanding and agree with the plan   Code Status - Full Code  Likely DC to  home   Condition   stable  Roxan Hockey M.D on 08/21/2020 at 11:37 PM Go to [email or web address] -  for contact info  [facility] - Office  [phone]

## 2020-08-21 NOTE — ED Triage Notes (Signed)
Blood in stool for 2 days

## 2020-08-21 NOTE — ED Provider Notes (Signed)
[facility] EMERGENCY DEPARTMENT Provider Note   CSN: [id number] Arrival date & time: 08/21/20  1350     History Chief Complaint  Patient presents with   Rectal Bleeding    Sheri Pope is a 80 y.o. female.  HPI Patient is a 80 year old female with a history of rheumatoid arthritis, hematuria, hiatal hernia, hyperlipidemia, leukemia, who presents to the emergency department due to hematochezia.  Patient states that she has been experiencing intermittent hematochezia for the past few months.  She states that she will typically have a single episode which will then resolve without intervention.  This would happen about 1-2 times per month.  She states that 2 days ago she began having more frequent hematochezia and yesterday had 4 large bowel movements that she states were bright red blood with blood clots.  She states that she had an additional episode this morning and came to the emergency department for evaluation.  She reports associated fatigue but denies chest pain, shortness of breath, or syncope.  Patient also complains of moderate epigastric pain that started at the onset of her symptoms.  She is not anticoagulated.  She takes hydrocodone as well as Robaxin for pain management but denies any NSAID use.  She does not drink alcohol.  She smokes 1 pack/day.  She has never had a colonoscopy.  She recently completed a course of doxycycline for an infection in her right foot.    Past Medical History:  Diagnosis Date   Arthritis    RA AND OA --PAIN AND SWELLIN IN LEFT KNEE   Blood in urine    NEGATIVE UROLOGY WORK UP --BUT ALWAYS HAS BLOOD IN URINE   GERD (gastroesophageal reflux disease)    H/O hiatal hernia    Hyperlipidemia    Leukemia (HCC)    PONV (postoperative nausea and vomiting)    ALWAYS SICK AFTER SURGERIES EXCEPT AFTER HIP REPLACMENT 2007   Vitamin D deficiency     Patient Active Problem List   Diagnosis Date Noted   Leukocytosis 10/21/2019   Neutropenia ([place])  10/21/2019   Trochanteric bursitis, left hip 12/19/2017   Degenerative arthritis of left knee 11/10/2011    Past Surgical History:  Procedure Laterality Date   ABDOMINAL HYSTERECTOMY  1870'S   BREAST SURGERY     BREAST BIOSPIES   CATARACT EXTRACTION W/PHACO Left 05/30/2012   Procedure: CATARACT EXTRACTION PHACO AND INTRAOCULAR LENS PLACEMENT ([place]);  Surgeon: Tonny Branch, MD;  Location: [facility];  Service: Ophthalmology;  Laterality: Left;  CDE: 16.99   CATARACT EXTRACTION W/PHACO Right 06/24/2012   Procedure: CATARACT EXTRACTION PHACO AND INTRAOCULAR LENS PLACEMENT ([facility]);  Surgeon: Tonny Branch, MD;  Location: [facility];  Service: Ophthalmology;  Laterality: Right;  CDE:17.92   HIP SURGERY     JOINT REPLACEMENT  2007   RIGHT HIP REPLACEMENT   LEFT KNEE ARTHROSCOPY  AUG 2012   MULTIPLE ORTHOPEDIC SURGERIES     ON BOTH HANDS AND BOTH FEET, RIGHT ELBOW   SPLEENECTOMY  IN THE 70'S   FOR LARGE CYST   TOTAL KNEE ARTHROPLASTY  11/10/2011   Procedure: TOTAL KNEE ARTHROPLASTY;  Surgeon: Mcarthur Rossetti, MD;  Location: [facility];  Service: Orthopedics;  Laterality: Left;  Left Total Knee Arthroplasty   YAG LASER APPLICATION Bilateral    Dr. Geoffry Paradise     OB History   No obstetric history on file.     Family History  Problem Relation Age of Onset   Diabetes Sister    Macular degeneration  Sister    Heart attack Mother    Stroke Father    Dementia Father    Cancer Brother    Healthy Sister    Healthy Sister    Diabetes Brother     Social History   Tobacco Use   Smoking status: Every Day    Packs/day: 1.00    Years: 50.00    Pack years: 50.00    Types: Cigarettes   Smokeless tobacco: Never  Substance Use Topics   Alcohol use: No   Drug use: No    Home Medications Prior to Admission medications   Medication Sig Start Date End Date Taking? Authorizing Provider  ALPRAZolam (XANAX) 0.25 MG tablet Take 0.25 mg by mouth at bedtime. Take 1/2 tablet every evening 06/14/20  Yes [provider]  diclofenac sodium (VOLTAREN) 1 % GEL Apply 2 g topically 4 (four) times daily. 12/19/17  Yes Mcarthur Rossetti, MD  doxycycline (VIBRAMYCIN) 100 MG capsule Take 1 capsule (100 mg total) by mouth 2 (two) times daily. 08/14/20  Yes [place], [place], PA-C  HYDROcodone-acetaminophen (NORCO/VICODIN) 5-325 MG tablet Take 0.5-1 tablets by mouth every 6 (six) hours as needed. 08/03/20  Yes [provider]  lactose free nutrition (BOOST) LIQD Take 1 Container by mouth daily.   Yes [provider]  methocarbamol (ROBAXIN) 500 MG tablet Take 1 tablet by mouth every 6 (six) hours as needed. 07/28/20  Yes [provider]  Polyvinyl Alcohol-Povidone (REFRESH OP) Place 1 drop into both eyes 3 (three) times daily.   Yes [provider]  dronabinol (MARINOL) 2.5 MG capsule Take 1 capsule (2.5 mg total) by mouth 2 (two) times daily before a meal. Patient not taking: No sig reported 07/20/20   Derek Jack, MD  MYRBETRIQ 25 MG TB24 tablet Take 25 mg by mouth at bedtime. Patient not taking: No sig reported 03/17/20   [provider]  NEOMYCIN-POLYMYXIN-HYDROCORTISONE (CORTISPORIN) 1 % SOLN OTIC solution SMARTSIG:In Ear(s) Patient not taking: No sig reported 03/17/20   [provider]    Allergies    Codeine, Prednisone, Sulfa antibiotics, Aleve [naproxen], and Latex  Review of Systems   Review of Systems  All other systems reviewed and are negative. Ten systems reviewed and are negative for acute change, except as noted in the HPI.   Physical Exam Updated Vital Signs BP (!) 174/74   Pulse 83   Temp 98.3 F (36.8 C) (Oral)   Resp 19   Ht 5\' 1"  (1.549 m)   Wt 44.3 kg   SpO2 96%   BMI 18.43 kg/m   Physical Exam Vitals and nursing note reviewed.  Constitutional:      General: She is not in acute distress.    Appearance: Normal appearance. She is not ill-appearing, toxic-appearing or diaphoretic.  HENT:     Head: Normocephalic  and atraumatic.     Right Ear: External ear normal.     Left Ear: External ear normal.     Nose: Nose normal.     Mouth/Throat:     Mouth: Mucous membranes are moist.     Pharynx: Oropharynx is clear. No oropharyngeal exudate or posterior oropharyngeal erythema.  Eyes:     General: No scleral icterus.       Right eye: No discharge.        Left eye: No discharge.     Extraocular Movements: Extraocular movements intact.     Comments: Conjunctiva pallor.  Cardiovascular:     Rate and Rhythm:  Normal rate and regular rhythm.     Pulses: Normal pulses.     Heart sounds: Normal heart sounds. No murmur heard.   No friction rub. No gallop.  Pulmonary:     Effort: Pulmonary effort is normal. No respiratory distress.     Breath sounds: Normal breath sounds. No stridor. No wheezing, rhonchi or rales.  Abdominal:     General: Abdomen is flat.     Palpations: Abdomen is soft.     Tenderness: There is abdominal tenderness.     Comments: Abdomen is flat and soft.  Moderate tenderness noted along the epigastrium.  Genitourinary:    Comments: Female nursing chaperone present.  Small nonthrombosed external hemorrhoid noted.  Additional palpable internal hemorrhoid noted.  Small amounts of brown stool with traces of blood noted in the rectal vault.  No tenderness appreciated throughout the exam. Musculoskeletal:        General: Normal range of motion.     Cervical back: Normal range of motion and neck supple. No tenderness.  Skin:    General: Skin is warm and dry.  Neurological:     General: No focal deficit present.     Mental Status: She is alert and oriented to person, place, and time.  Psychiatric:        Mood and Affect: Mood normal.        Behavior: Behavior normal.    ED Results / Procedures / Treatments   Labs (all labs ordered are listed, but only abnormal results are displayed) Labs Reviewed  COMPREHENSIVE METABOLIC PANEL - Abnormal; Notable for the following components:       Result Value   Sodium 129 (*)    Albumin 3.0 (*)    All other components within normal limits  CBC - Abnormal; Notable for the following components:   WBC 12.0 (*)    RDW 18.4 (*)    All other components within normal limits  POC OCCULT BLOOD, ED - Abnormal; Notable for the following components:   Fecal Occult Bld POSITIVE (*)    All other components within normal limits  RESP PANEL BY RT-PCR (FLU A&B, COVID) ARPGX2  TYPE AND SCREEN   EKG None  Radiology CT ABDOMEN PELVIS W CONTRAST  Result Date: 08/21/2020 CLINICAL DATA:  [place] GI bleeding and epigastric pain. Blood in stool x 2 days. Hx of leukemia, hiatal hernia, hysterectomy, splenectomy. No oral per provider. Denies pain, N/V/D. EXAM: CT ABDOMEN AND PELVIS WITH CONTRAST TECHNIQUE: Multidetector CT imaging of the abdomen and pelvis was performed using the standard protocol following bolus administration of intravenous contrast. CONTRAST:  71mL OMNIPAQUE IOHEXOL 300 MG/ML  SOLN COMPARISON:  CT abdomen pelvis 11/06/2019, CT chest 12/01/2004 FINDINGS: Lower chest: No acute abnormality. Hepatobiliary: Stable 1 cm per peripheral left hepatic lobe lesion that is too small to characterize. No focal liver abnormality. No gallstones, gallbladder wall thickening, or pericholecystic fluid. No biliary dilatation. Pancreas: No focal lesion. Normal pancreatic contour. No surrounding inflammatory changes. No main pancreatic ductal dilatation. Spleen: Status post splenectomy. Left upper quadrant 3 cm soft tissue density suggestive of splenosis. Adrenals/Urinary Tract: No adrenal nodule bilaterally. Bilateral kidneys enhance symmetrically. No hydronephrosis. No hydroureter. The urinary bladder is unremarkable. Stomach/Bowel: Stomach is within normal limits. No evidence of bowel wall thickening or dilatation. Mobile medialized cecum. Scattered colonic diverticulosis. Appendix not definitely identified with no evidence of inflammatory changes to suggest acute  appendicitis. Vascular/Lymphatic: No abdominal aorta or iliac aneurysm. Severe atherosclerotic plaque of the aorta and its  branches. No abdominal, pelvic, or inguinal lymphadenopathy. Reproductive: Limited evaluation of the pelvis due to streak artifact originating from a right hip arthroplasty. Status post hysterectomy. No adnexal masses. Other: No intraperitoneal free fluid. No intraperitoneal free gas. No organized fluid collection. Musculoskeletal: No abdominal wall hernia or abnormality. No suspicious lytic or blastic osseous lesions. No acute displaced fracture. Multilevel degenerative changes of the spine. Status post partially visualized total right hip arthroplasty. IMPRESSION: 1. No acute intra-abdominal or intrapelvic abnormality on this single phase portal venous study. 2. Scattered colonic diverticulosis with no acute diverticulitis. 3. Similar in size indeterminate 1 cm left hepatic lesion. Given chronicity, finding likely benign. Findings suggestive of left upper quadrant splenosis in a patient status post splenectomy. 4.  Aortic Atherosclerosis (ICD10-I70.0). Electronically Signed   By: Iven Finn M.D.   On: 08/21/2020 17:15    Procedures Procedures   Medications Ordered in ED Medications  sodium chloride 0.9 % bolus 500 mL (0 mLs Intravenous Stopped 08/21/20 1641)  iohexol (OMNIPAQUE) 300 MG/ML solution 75 mL (75 mLs Intravenous Contrast Given 08/21/20 1624)  pantoprazole (PROTONIX) injection 40 mg (40 mg Intravenous Given 08/21/20 1816)    ED Course  I have reviewed the triage vital signs and the nursing notes.  Pertinent labs & imaging results that were available during my care of the patient were reviewed by me and considered in my medical decision making (see chart for details).    MDM Rules/Calculators/A&P                          Pt is a 80 y.o. female who presents to the emergency department due to rectal bleeding.  Labs: CBC with a leukocytosis of 12 and an RDW of  18.4.  Hemoglobin within normal limits at 13.7 with platelets of 288. CMP with a sodium of 129, albumin of 3.0.  Patient given 500 cc of normal saline. Fecal occult blood is positive.  Patient had brown stool mixed with traces of blood.  Imaging: CT scan of the abdomen/pelvis with IV contrast shows no acute intra-abdominal or intrapelvic abnormality on the single phase portal venous study.  There are scattered colonic diverticulosis with no acute diverticulitis.  Similar in size indeterminate 1 cm left hepatic lesion.  I, Rayna Sexton, PA-C, personally reviewed and evaluated these images and lab results as part of my medical decision-making.  Patient has had 4-5 episodes of large amounts of bright red bloody stools over the past 24 hours.  Endorses some mild fatigue today.  She has never had a colonoscopy.  She was discussed with Dr. Gala Romney with gastroenterology who agrees with admission.  Recommends that we start her on a PPI and keep her on a clear liquid diet.  He will evaluate patient tomorrow morning.  Discussed admission with the patient and she is amenable.  Respiratory panel obtained.  Started on Protonix.  Will discuss with the medicine team.  Note: Portions of this report may have been transcribed using voice recognition software. Every effort was made to ensure accuracy; however, inadvertent computerized transcription errors may be present.   Final Clinical Impression(s) / ED Diagnoses Final diagnoses:  Rectal bleeding    Rx / DC Orders ED Discharge Orders     None        Rayna Sexton, PA-C 08/21/20 [place], MD 08/22/20 0001

## 2020-08-22 DIAGNOSIS — M069 Rheumatoid arthritis, unspecified: Secondary | ICD-10-CM

## 2020-08-22 DIAGNOSIS — K625 Hemorrhage of anus and rectum: Secondary | ICD-10-CM | POA: Diagnosis not present

## 2020-08-22 LAB — BASIC METABOLIC PANEL
Anion gap: 4 — ABNORMAL LOW (ref 5–15)
BUN: 10 mg/dL (ref 8–23)
CO2: 27 mmol/L (ref 22–32)
Calcium: 8.5 mg/dL — ABNORMAL LOW (ref 8.9–10.3)
Chloride: 101 mmol/L (ref 98–111)
Creatinine, Ser: 0.57 mg/dL (ref 0.44–1.00)
GFR, Estimated: >60 mL/min (ref >60)
Glucose, Bld: 91 mg/dL (ref 70–99)
Potassium: 3.5 mmol/L (ref 3.5–5.1)
Sodium: 132 mmol/L — ABNORMAL LOW (ref 135–145)

## 2020-08-22 LAB — CBC
HCT: 35.7 % — ABNORMAL LOW (ref 36.0–46.0)
Hemoglobin: 12 g/dL (ref 12.0–15.0)
MCH: 31.9 pg (ref 26.0–34.0)
MCHC: 33.6 g/dL (ref 30.0–36.0)
MCV: 94.9 fL (ref 80.0–100.0)
Platelets: 271 10*3/uL (ref 150–400)
RBC: 3.76 MIL/uL — ABNORMAL LOW (ref 3.87–5.11)
RDW: 18.2 % — ABNORMAL HIGH (ref 11.5–15.5)
WBC: 10.8 10*3/uL — ABNORMAL HIGH (ref 4.0–10.5)
nRBC: 0.2 % (ref 0.0–0.2)

## 2020-08-22 MED ORDER — HYDROCORTISONE ACETATE 25 MG RE SUPP: 25.0000 mg | Freq: Two times a day (BID) | RECTAL | 0 refills | Status: DC

## 2020-08-22 MED ORDER — PANTOPRAZOLE SODIUM 40 MG PO TBEC: 40.0000 mg | DELAYED_RELEASE_TABLET | Freq: Every day | ORAL | 1 refills | Status: DC

## 2020-08-22 MED ORDER — NICOTINE 21 MG/24HR TD PT24: 21.0000 mg | MEDICATED_PATCH | Freq: Every day | TRANSDERMAL | 0 refills | Status: DC

## 2020-08-22 NOTE — Consult Note (Signed)
Referring Provider: No ref. provider found Primary Care Physician:  Celene Squibb, MD Primary Gastroenterologist:  Dr.Hailley Byers  Reason for Consultation: Rectal bleeding  HPI: 80 year old lady with advanced rheumatoid arthritis presents with small clots and gross blood per rectum for the past 2 days.  Presented to the ED yesterday where she was evaluated and found to have blood in her stool.  Hemodynamically stable hemoglobin 13.7 on admission;12.0 today.  Really denies any abdominal pain whatsoever to me.  CT in the ED demonstrated no acute intra-abdominal processes.  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 Recently on Vibramycin for cellulitis.  No recent GERD symptoms, odynophagia, or dysphagia.  No nausea or vomiting.  Stools may have been a little dark once or twice.  At baseline, she moves her bowels once daily. Occasional Alka-Seltzer plus.  Takes methocarbamol and hydrocodone for her RA symptoms.  No Pepto-Bismol. Prior GI tract evaluation includes sigmoidoscopy only some 30-40 years ago. Patient gives a history of a peptic ulcer on EGD done in [place], also, many years ago. She is being evaluated by Dr. Delton Coombes for lymphocytic leukocytosis.  She reports having a bone marrow just last week-results not yet available. Patient repeatedly states all of her problems have been made worse by stressful home situation.   Past Medical History:  Diagnosis Date   Arthritis    RA AND OA --PAIN AND SWELLIN IN LEFT KNEE   Blood in urine    NEGATIVE UROLOGY WORK UP --BUT ALWAYS HAS BLOOD IN URINE   GERD (gastroesophageal reflux disease)    H/O hiatal hernia    Hyperlipidemia    Leukemia (HCC)    PONV (postoperative nausea and vomiting)    ALWAYS SICK AFTER SURGERIES EXCEPT AFTER HIP REPLACMENT 2007   Vitamin D deficiency     Past Surgical History:  Procedure Laterality Date   ABDOMINAL HYSTERECTOMY  1870'S   BREAST SURGERY     BREAST BIOSPIES   CATARACT EXTRACTION W/PHACO Left  05/30/2012   Procedure: CATARACT EXTRACTION PHACO AND INTRAOCULAR LENS PLACEMENT ([place]);  Surgeon: Tonny Branch, MD;  Location: [facility];  Service: Ophthalmology;  Laterality: Left;  CDE: 16.99   CATARACT EXTRACTION W/PHACO Right 06/24/2012   Procedure: CATARACT EXTRACTION PHACO AND INTRAOCULAR LENS PLACEMENT ([facility]);  Surgeon: Tonny Branch, MD;  Location: [facility];  Service: Ophthalmology;  Laterality: Right;  CDE:17.92   HIP SURGERY     JOINT REPLACEMENT  2007   RIGHT HIP REPLACEMENT   LEFT KNEE ARTHROSCOPY  AUG 2012   MULTIPLE ORTHOPEDIC SURGERIES     ON BOTH HANDS AND BOTH FEET, RIGHT ELBOW   SPLEENECTOMY  IN THE 70'S   FOR LARGE CYST   TOTAL KNEE ARTHROPLASTY  11/10/2011   Procedure: TOTAL KNEE ARTHROPLASTY;  Surgeon: Mcarthur Rossetti, MD;  Location: [facility];  Service: Orthopedics;  Laterality: Left;  Left Total Knee Arthroplasty   YAG LASER APPLICATION Bilateral    Dr. Geoffry Paradise    Prior to Admission medications   Medication Sig Start Date End Date Taking? Authorizing Provider  ALPRAZolam (XANAX) 0.25 MG tablet Take 0.25 mg by mouth at bedtime. Take 1/2 tablet every evening 06/14/20  Yes [provider]  diclofenac sodium (VOLTAREN) 1 % GEL Apply 2 g topically 4 (four) times daily. 12/19/17  Yes Mcarthur Rossetti, MD  doxycycline (VIBRAMYCIN) 100 MG capsule Take 1 capsule (100 mg total) by mouth 2 (two) times daily. 08/14/20  Yes [place], [place], PA-C  HYDROcodone-acetaminophen (NORCO/VICODIN) 5-325 MG tablet  Take 0.5-1 tablets by mouth every 6 (six) hours as needed. 08/03/20  Yes [provider]  lactose free nutrition (BOOST) LIQD Take 1 Container by mouth daily.   Yes [provider]  methocarbamol (ROBAXIN) 500 MG tablet Take 1 tablet by mouth every 6 (six) hours as needed. 07/28/20  Yes [provider]  Polyvinyl Alcohol-Povidone (REFRESH OP) Place 1 drop into both eyes 3 (three) times daily.   Yes [provider]  dronabinol (MARINOL) 2.5 MG  capsule Take 1 capsule (2.5 mg total) by mouth 2 (two) times daily before a meal. Patient not taking: No sig reported 07/20/20   Derek Jack, MD  MYRBETRIQ 25 MG TB24 tablet Take 25 mg by mouth at bedtime. Patient not taking: No sig reported 03/17/20   [provider]  NEOMYCIN-POLYMYXIN-HYDROCORTISONE (CORTISPORIN) 1 % SOLN OTIC solution SMARTSIG:In Ear(s) Patient not taking: No sig reported 03/17/20   [provider]    Current Facility-Administered Medications  Medication Dose Route Frequency Provider Last Rate Last Admin   0.9 %  sodium chloride infusion   Intravenous Continuous Emokpae, Courage, MD 75 mL/hr at 08/21/20 2124 75 mL/hr at 08/21/20 2124   0.9 %  sodium chloride infusion  250 mL Intravenous PRN Emokpae, Courage, MD       acetaminophen (TYLENOL) tablet 650 mg  650 mg Oral Q6H PRN Emokpae, Courage, MD       Or   acetaminophen (TYLENOL) suppository 650 mg  650 mg Rectal Q6H PRN Denton Brick, Courage, MD       ALPRAZolam Duanne Moron) tablet 0.25 mg  0.25 mg Oral QHS PRN Denton Brick, Courage, MD   0.25 mg at 08/21/20 2333   bisacodyl (DULCOLAX) suppository 10 mg  10 mg Rectal Daily PRN Roxan Hockey, MD       diclofenac sodium (VOLTAREN) 1 % transdermal gel 2 g  2 g Topical QID Denton Brick, Courage, MD   2 g at 08/22/20 0841   HYDROcodone-acetaminophen (NORCO/VICODIN) 5-325 MG per tablet 1 tablet  1 tablet Oral Q6H PRN Roxan Hockey, MD   1 tablet at 08/22/20 0840   hydrocortisone (ANUSOL-HC) suppository 25 mg  25 mg Rectal BID Roxan Hockey, MD   25 mg at 08/22/20 0840   labetalol (NORMODYNE) injection 10 mg  10 mg Intravenous Q4H PRN Emokpae, Courage, MD       lactose free nutrition (Boost) liquid 237 mL  1 Container Oral Daily Emokpae, Courage, MD       methocarbamol (ROBAXIN) tablet 500 mg  500 mg Oral Q6H PRN Denton Brick, Courage, MD   500 mg at 08/22/20 0840   nicotine (NICODERM CQ - dosed in mg/24 hours) patch 21 mg  21 mg Transdermal Daily Emokpae, Courage, MD    21 mg at 08/22/20 0840   ondansetron (ZOFRAN) tablet 4 mg  4 mg Oral Q6H PRN Emokpae, Courage, MD       Or   ondansetron (ZOFRAN) injection 4 mg  4 mg Intravenous Q6H PRN Emokpae, Courage, MD       pantoprazole (PROTONIX) injection 40 mg  40 mg Intravenous Q12H Emokpae, Courage, MD   40 mg at 08/22/20 0840   polyethylene glycol (MIRALAX / GLYCOLAX) packet 17 g  17 g Oral Daily Emokpae, Courage, MD       sodium chloride flush (NS) 0.9 % injection 3 mL  3 mL Intravenous Q12H Emokpae, Courage, MD   3 mL at 08/21/20 2131   sodium chloride flush (NS) 0.9 % injection 3 mL  3  mL Intravenous Q12H Emokpae, Courage, MD   3 mL at 08/21/20 2131   sodium chloride flush (NS) 0.9 % injection 3 mL  3 mL Intravenous PRN Emokpae, Courage, MD       traZODone (DESYREL) tablet 50 mg  50 mg Oral QHS PRN Roxan Hockey, MD        Allergies as of 08/21/2020 - Review Complete 08/21/2020  Allergen Reaction Noted   Codeine Nausea And Vomiting 10/26/2011   Prednisone Other (See Comments) 10/26/2011   Sulfa antibiotics Other (See Comments) 10/26/2011   Aleve [naproxen] Anxiety 10/21/2019   Latex Rash 10/26/2011    Family History  Problem Relation Age of Onset   Diabetes Sister    Macular degeneration Sister    Heart attack Mother    Stroke Father    Dementia Father    Cancer Brother    Healthy Sister    Healthy Sister    Diabetes Brother     Social History   Socioeconomic History   Marital status: Widowed    Spouse name: Not on file   Number of children: Not on file   Years of education: Not on file   Highest education level: Not on file  Occupational History   Occupation: retired  Tobacco Use   Smoking status: Every Day    Packs/day: 1.00    Years: 50.00    Pack years: 50.00    Types: Cigarettes   Smokeless tobacco: Never  Substance and Sexual Activity   Alcohol use: No   Drug use: No   Sexual activity: Not Currently    Birth control/protection: Surgical  Other Topics Concern   Not on  file  Social History Narrative   Not on file   Social Determinants of Health   Financial Resource Strain: Not on file  Food Insecurity: Not on file  Transportation Needs: Not on file  Physical Activity: Not on file  Stress: Not on file  Social Connections: Not on file  Intimate Partner Violence: Not on file    Review of Systems: As in history of present illness  Physical Exam: Vital signs in last 24 hours: Temp:  [97.9 F (36.6 C)-98.6 F (37 C)] 98.6 F (37 C) (06/26 0600) Pulse Rate:  [78-96] 80 (06/26 0600) Resp:  [13-21] 16 (06/26 0600) BP: (117-180)/(54-91) 144/61 (06/26 0600) SpO2:  [89 %-99 %] 99 % (06/26 0600) Weight:  [44.3 kg] 44.3 kg (06/25 1418) Last BM Date: 08/21/20 Appears comfortable in no acute distress.  Accompanied by her niece. Neck:  Supple; no masses or thyromegaly. Lungs:  Clear throughout to auscultation.   No wheezes, crackles, or rhonchi. No acute distress. Heart:  Regular rate and rhythm; no murmurs, clicks, rubs,  or gallops. Abdomen: Nondistended.  Positive bowel sounds soft and nontender without appreciable mass organomegaly  Extremity: Marked deformity of both wrist and hands with ulnar deviation and rheumatoid nodules. Intake/Output from previous day: 06/25 0701 - 06/26 0700 In: 1005.6 [P.O.:240; I.V.:265.6; IV Piggyback:500] Out: -  Intake/Output this shift: No intake/output data recorded.  Lab Results: Recent Labs    08/21/20 1503 08/22/20 0402  WBC 12.0* 10.8*  HGB 13.7 12.0  HCT 40.1 35.7*  PLT 288 271   BMET Recent Labs    08/21/20 1503 08/22/20 0402  NA 129* 132*  K 3.9 3.5  CL 98 101  CO2 25 27  GLUCOSE 93 91  BUN 13 10  CREATININE 0.55 0.57  CALCIUM 9.0 8.5*   LFT Recent Labs  08/21/20 1503  PROT 7.8  ALBUMIN 3.0*  AST 17  ALT 17  ALKPHOS 92  BILITOT 0.4   PT/INR No results for input(s): LABPROT, INR in the last 72 hours. Hepatitis Panel No results for input(s): HEPBSAG, HCVAB, HEPAIGM, HEPBIGM  in the last 72 hours. C-Diff No results for input(s): CDIFFTOX in the last 72 hours.  Studies/Results: CT ABDOMEN PELVIS W CONTRAST  Result Date: 08/21/2020 CLINICAL DATA:  [place] GI bleeding and epigastric pain. Blood in stool x 2 days. Hx of leukemia, hiatal hernia, hysterectomy, splenectomy. No oral per provider. Denies pain, N/V/D. EXAM: CT ABDOMEN AND PELVIS WITH CONTRAST TECHNIQUE: Multidetector CT imaging of the abdomen and pelvis was performed using the standard protocol following bolus administration of intravenous contrast. CONTRAST:  22mL OMNIPAQUE IOHEXOL 300 MG/ML  SOLN COMPARISON:  CT abdomen pelvis 11/06/2019, CT chest 12/01/2004 FINDINGS: Lower chest: No acute abnormality. Hepatobiliary: Stable 1 cm per peripheral left hepatic lobe lesion that is too small to characterize. No focal liver abnormality. No gallstones, gallbladder wall thickening, or pericholecystic fluid. No biliary dilatation. Pancreas: No focal lesion. Normal pancreatic contour. No surrounding inflammatory changes. No main pancreatic ductal dilatation. Spleen: Status post splenectomy. Left upper quadrant 3 cm soft tissue density suggestive of splenosis. Adrenals/Urinary Tract: No adrenal nodule bilaterally. Bilateral kidneys enhance symmetrically. No hydronephrosis. No hydroureter. The urinary bladder is unremarkable. Stomach/Bowel: Stomach is within normal limits. No evidence of bowel wall thickening or dilatation. Mobile medialized cecum. Scattered colonic diverticulosis. Appendix not definitely identified with no evidence of inflammatory changes to suggest acute appendicitis. Vascular/Lymphatic: No abdominal aorta or iliac aneurysm. Severe atherosclerotic plaque of the aorta and its branches. No abdominal, pelvic, or inguinal lymphadenopathy. Reproductive: Limited evaluation of the pelvis due to streak artifact originating from a right hip arthroplasty. Status post hysterectomy. No adnexal masses. Other: No intraperitoneal  free fluid. No intraperitoneal free gas. No organized fluid collection. Musculoskeletal: No abdominal wall hernia or abnormality. No suspicious lytic or blastic osseous lesions. No acute displaced fracture. Multilevel degenerative changes of the spine. Status post partially visualized total right hip arthroplasty. IMPRESSION: 1. No acute intra-abdominal or intrapelvic abnormality on this single phase portal venous study. 2. Scattered colonic diverticulosis with no acute diverticulitis. 3. Similar in size indeterminate 1 cm left hepatic lesion. Given chronicity, finding likely benign. Findings suggestive of left upper quadrant splenosis in a patient status post splenectomy. 4.  Aortic Atherosclerosis (ICD10-I70.0). Electronically Signed   By: Iven Finn M.D.   On: 08/21/2020 17:15     Impression: Very pleasant 80 year old lady admitted to the hospital with essentially low-volume painless hematochezia.  She has had a relative, but not absolute drop, in her hemoglobin which remains in the normal range.  She has remained stable overnight in the hospital.  Distant history of peptic ulcer disease.  Occasional NSAID in the way of "Alka-Seltzer". GI bleed appears relatively trivial at this time.  Recommendations:  I have recommended a diagnostic colonoscopy and possible EGD in the near future.  I have offered to perform while she is here.  She states she is not interested in having it now; she wants to go home and return at a later date in the near future.  This is not unreasonable.  Therefore, will advance her to a regular diet.  If she does well through lunch, she can be discharged from a GI standpoint.  Continue once daily Protonix 40 mg.  Anusol empirically as discussed with Dr. Clifton Custard  Avoid all NSAID products.  I  will arrange outpatient follow-up.  Should she have evidence of recurrent/ongoing bleeding she is to return to the hospital.       Notice:  This dictation was prepared with  Dragon dictation along with smaller phrase technology. Any transcriptional errors that result from this process are unintentional and may not be corrected upon review.

## 2020-08-22 NOTE — Progress Notes (Signed)
Nsg Discharge Note  Admit Date:  08/21/2020 Discharge date: 08/22/2020   Sheri Pope to be D/C'd Home per MD order.  AVS completed.  Copy for chart, and copy for patient signed, and dated. Patient/caregiver able to verbalize understanding. IV removed. Discharge paper work given and reviewed with patient. Patient stable upon discharge   Discharge Medication: Allergies as of 08/22/2020       Reactions   Codeine Nausea And Vomiting   Prednisone Other (See Comments)   Thought she was having a heart attack   Sulfa Antibiotics Other (See Comments)   unknown   Aleve [naproxen] Anxiety   Jittery and hot flashes   Latex Rash        Medication List     STOP taking these medications    doxycycline 100 MG capsule Commonly known as: VIBRAMYCIN   Myrbetriq 25 MG Tb24 tablet Generic drug: mirabegron ER   NEOMYCIN-POLYMYXIN-HYDROCORTISONE 1 % Soln OTIC solution Commonly known as: CORTISPORIN       TAKE these medications    ALPRAZolam 0.25 MG tablet Commonly known as: XANAX Take 0.25 mg by mouth at bedtime. Take 1/2 tablet every evening   diclofenac sodium 1 % Gel Commonly known as: Voltaren Apply 2 g topically 4 (four) times daily.   dronabinol 2.5 MG capsule Commonly known as: MARINOL Take 1 capsule (2.5 mg total) by mouth 2 (two) times daily before a meal.   HYDROcodone-acetaminophen 5-325 MG tablet Commonly known as: NORCO/VICODIN Take 0.5-1 tablets by mouth every 6 (six) hours as needed.   hydrocortisone 25 MG suppository Commonly known as: ANUSOL-HC Place 1 suppository (25 mg total) rectally 2 (two) times daily.   lactose free nutrition Liqd Take 1 Container by mouth daily.   methocarbamol 500 MG tablet Commonly known as: ROBAXIN Take 1 tablet by mouth every 6 (six) hours as needed.   nicotine 21 mg/24hr patch Commonly known as: NICODERM CQ - dosed in mg/24 hours Place 1 patch (21 mg total) onto the skin daily. Start taking on: August 23, 2020    pantoprazole 40 MG tablet Commonly known as: Protonix Take 1 tablet (40 mg total) by mouth daily.   REFRESH OP Place 1 drop into both eyes 3 (three) times daily.        Discharge Assessment: Vitals:   08/22/20 0159 08/22/20 0600  BP: 117/77 (!) 144/61  Pulse: 78 80  Resp: 16 16  Temp: 98.6 F (37 C) 98.6 F (37 C)  SpO2: 99% 99%   Skin clean, dry and intact without evidence of skin break down, no evidence of skin tears noted. IV catheter discontinued intact. Site without signs and symptoms of complications - no redness or edema noted at insertion site, patient denies c/o pain - only slight tenderness at site.  Dressing with slight pressure applied.  D/c Instructions-Education: Discharge instructions given to patient/family with verbalized understanding. D/c education completed with patient/family including follow up instructions, medication list, d/c activities limitations if indicated, with other d/c instructions as indicated by MD - patient able to verbalize understanding, all questions fully answered. Patient instructed to return to ED, call 911, or call MD for any changes in condition.  Patient escorted via [place], and D/C home via private auto.  Zenaida Deed, RN 08/22/2020 1:25 PM

## 2020-08-22 NOTE — Discharge Summary (Signed)
Physician Discharge Summary  Sheri Pope JKK:[id number] DOB: 28-Dec-1940 DOA: 08/21/2020  PCP: Celene Squibb, MD  Admit date: 08/21/2020 Discharge date: 08/22/2020  Time spent: 35 minutes  Recommendations for Outpatient Follow-up:  Repeat CBC to follow hemoglobin trend. Repeat basic metabolic panel to evaluate lites and renal function. Patient to follow-up with gastroenterology as an outpatient for endoscopic evaluation.  Discharge Diagnoses:  Principal Problem:   Rectal bleeding Active Problems:   Rheumatoid arthritis ([place])   Discharge Condition: Stable and improved.  Discharged home with instruction to follow-up with PCP and gastroenterology service.  CODE STATUS: Full code  Diet recommendation: Regular diet.  Filed Weights   08/21/20 1418  Weight: 44.3 kg    History of brief present illness:  As per H&P written by Dr. Denton Brick on 08/21/2020 Sheri Pope  is a 80 y.o. female smoker with past medical history relevant for severe/advanced rheumatoid arthritis, osteoarthritis, HLD and history of leukemia who presents to the ED with bright red blood per rectum since 08/20/2020--patient had 4 episodes of bright red blood per rectum on 08/20/2020 with clots and additional 2 episodes on 08/21/2020--- there were painless -No vomiting, denies abdominal pain No fever  Or chills No chest pain, no palpitations no dizziness -Additional history obtained from patient's niece Sheri Pope at bedside -Patient had sigmoidoscopy more than 30 years ago, no prior colonoscopy --EDP exam reveals hemorrhoids.  Hospital Course:  1-bright red blood per rectum -In what appears to be internal hemorrhoids -Patient was also complaining of midepigastric discomfort; but tolerating diet. -After discussing with gastroenterology she was given they offered for endoscopy and colonoscopy while in-house, but she has decided to go home and pursued endoscopic evaluation electively. -Patient will be discharged on Protonix  daily, instructions to avoid the use of NSAIDs/Alka-Seltzer; and also with instruction to use Anusol twice daily. -Outpatient follow-up with GI service.  2-gastroesophageal flux disease -Discharged on daily PPI.  3-history of rheumatoid arthritis -Continue patient follow-up with rheumatology. -Continue as needed analgesics as previously prescribed -Significant joint deformities appreciated on examination.  4-anxiety -Continue the use of Xanax as needed.  5-history of tobacco abuse -Cessation counseling provided-nicotine patch has been prescribed.   Procedures: See below for x-ray reports.  Consultations: Gastroenterology service  Discharge Exam: Vitals:   08/22/20 0159 08/22/20 0600  BP: 117/77 (!) 144/61  Pulse: 78 80  Resp: 16 16  Temp: 98.6 F (37 C) 98.6 F (37 C)  SpO2: 99% 99%    General: No fever, no chest pain, no nausea, no vomiting.  Tolerating diet and feeling ready to go home.  No further significant bleeding appreciated. Cardiovascular: RRR, S1/S2, no cyanosis or clubbing.  No JVD on exam. Respiratory: Good air movement bilaterally, no wheezing, no crackles, no using accessory muscles. Abdomen: Soft, nontender and nondistended; positive bowel sounds appreciated. Extremities: No cyanosis or clubbing.   Discharge Instructions   Discharge Instructions     Discharge instructions   Complete by: As directed    Take medications as prescribed Maintain adequate hydration Follow-up with PCP in 10 days Follow-up with gastroenterology service as an outpatient. Avoid the use of NSAIDs and Alka-Seltzer. Take medications as prescribed.   Increase activity slowly   Complete by: As directed       Allergies as of 08/22/2020       Reactions   Codeine Nausea And Vomiting   Prednisone Other (See Comments)   Thought she was having a heart attack   Sulfa Antibiotics Other (See Comments)  unknown   Aleve [naproxen] Anxiety   Jittery and hot flashes   Latex  Rash        Medication List     STOP taking these medications    doxycycline 100 MG capsule Commonly known as: VIBRAMYCIN   Myrbetriq 25 MG Tb24 tablet Generic drug: mirabegron ER   NEOMYCIN-POLYMYXIN-HYDROCORTISONE 1 % Soln OTIC solution Commonly known as: CORTISPORIN       TAKE these medications    ALPRAZolam 0.25 MG tablet Commonly known as: XANAX Take 0.25 mg by mouth at bedtime. Take 1/2 tablet every evening   diclofenac sodium 1 % Gel Commonly known as: Voltaren Apply 2 g topically 4 (four) times daily.   dronabinol 2.5 MG capsule Commonly known as: MARINOL Take 1 capsule (2.5 mg total) by mouth 2 (two) times daily before a meal.   HYDROcodone-acetaminophen 5-325 MG tablet Commonly known as: NORCO/VICODIN Take 0.5-1 tablets by mouth every 6 (six) hours as needed.   hydrocortisone 25 MG suppository Commonly known as: ANUSOL-HC Place 1 suppository (25 mg total) rectally 2 (two) times daily.   lactose free nutrition Liqd Take 1 Container by mouth daily.   methocarbamol 500 MG tablet Commonly known as: ROBAXIN Take 1 tablet by mouth every 6 (six) hours as needed.   nicotine 21 mg/24hr patch Commonly known as: NICODERM CQ - dosed in mg/24 hours Place 1 patch (21 mg total) onto the skin daily. Start taking on: August 23, 2020   pantoprazole 40 MG tablet Commonly known as: Protonix Take 1 tablet (40 mg total) by mouth daily.   REFRESH OP Place 1 drop into both eyes 3 (three) times daily.       Allergies  Allergen Reactions   Codeine Nausea And Vomiting   Prednisone Other (See Comments)    Thought she was having a heart attack   Sulfa Antibiotics Other (See Comments)    unknown   Aleve [Naproxen] Anxiety    Jittery and hot flashes   Latex Rash    Follow-up Information     Celene Squibb, MD. Schedule an appointment as soon as possible for a visit in 10 day(s).   Specialty: Internal Medicine Contact information: [place] [address] [phone]                 The results of significant diagnostics from this hospitalization (including imaging, microbiology, ancillary and laboratory) are listed below for reference.    Significant Diagnostic Studies: CT ABDOMEN PELVIS W CONTRAST  Result Date: 08/21/2020 CLINICAL DATA:  Melena GI bleeding and epigastric pain. Blood in stool x 2 days. Hx of leukemia, hiatal hernia, hysterectomy, splenectomy. No oral per provider. Denies pain, N/V/D. EXAM: CT ABDOMEN AND PELVIS WITH CONTRAST TECHNIQUE: Multidetector CT imaging of the abdomen and pelvis was performed using the standard protocol following bolus administration of intravenous contrast. CONTRAST:  18mL OMNIPAQUE IOHEXOL 300 MG/ML  SOLN COMPARISON:  CT abdomen pelvis 11/06/2019, CT chest 12/01/2004 FINDINGS: Lower chest: No acute abnormality. Hepatobiliary: Stable 1 cm per peripheral left hepatic lobe lesion that is too small to characterize. No focal liver abnormality. No gallstones, gallbladder wall thickening, or pericholecystic fluid. No biliary dilatation. Pancreas: No focal lesion. Normal pancreatic contour. No surrounding inflammatory changes. No main pancreatic ductal dilatation. Spleen: Status post splenectomy. Left upper quadrant 3 cm soft tissue density suggestive of splenosis. Adrenals/Urinary Tract: No adrenal nodule bilaterally. Bilateral kidneys enhance symmetrically. No hydronephrosis. No hydroureter. The urinary bladder is unremarkable. Stomach/Bowel: Stomach  is within normal limits. No evidence of bowel wall thickening or dilatation. Mobile medialized cecum. Scattered colonic diverticulosis. Appendix not definitely identified with no evidence of inflammatory changes to suggest acute appendicitis. Vascular/Lymphatic: No abdominal aorta or iliac aneurysm. Severe atherosclerotic plaque of the aorta and its branches. No abdominal, pelvic, or inguinal lymphadenopathy. Reproductive: Limited evaluation  of the pelvis due to streak artifact originating from a right hip arthroplasty. Status post hysterectomy. No adnexal masses. Other: No intraperitoneal free fluid. No intraperitoneal free gas. No organized fluid collection. Musculoskeletal: No abdominal wall hernia or abnormality. No suspicious lytic or blastic osseous lesions. No acute displaced fracture. Multilevel degenerative changes of the spine. Status post partially visualized total right hip arthroplasty. IMPRESSION: 1. No acute intra-abdominal or intrapelvic abnormality on this single phase portal venous study. 2. Scattered colonic diverticulosis with no acute diverticulitis. 3. Similar in size indeterminate 1 cm left hepatic lesion. Given chronicity, finding likely benign. Findings suggestive of left upper quadrant splenosis in a patient status post splenectomy. 4.  Aortic Atherosclerosis (ICD10-I70.0). Electronically Signed   By: Iven Finn M.D.   On: 08/21/2020 17:15    Microbiology: Recent Results (from the past 240 hour(s))  Resp Panel by RT-PCR (Flu A&B, Covid) Nasopharyngeal Swab     Status: None   Collection Time: 08/21/20  6:16 PM   Specimen: Nasopharyngeal Swab; Nasopharyngeal(NP) swabs in vial transport medium  Result Value Ref Range Status   SARS Coronavirus 2 by RT PCR NEGATIVE NEGATIVE Final    Comment: (NOTE) SARS-CoV-2 target nucleic acids are NOT DETECTED.  The SARS-CoV-2 RNA is generally detectable in upper respiratory specimens during the acute phase of infection. The lowest concentration of SARS-CoV-2 viral copies this assay can detect is 138 copies/mL. A negative result does not preclude SARS-Cov-2 infection and should not be used as the sole basis for treatment or other patient management decisions. A negative result may occur with  improper specimen collection/handling, submission of specimen other than nasopharyngeal swab, presence of viral mutation(s) within the areas targeted by this assay, and inadequate  number of viral copies(<138 copies/mL). A negative result must be combined with clinical observations, patient history, and epidemiological information. The expected result is Negative.  Fact Sheet for Patients:  EntrepreneurPulse.com.au  Fact Sheet for Healthcare Providers:  IncredibleEmployment.be  This test is no t yet approved or cleared by the [place] FDA and  has been authorized for detection and/or diagnosis of SARS-CoV-2 by FDA under an Emergency Use Authorization (EUA). This EUA will remain  in effect (meaning this test can be used) for the duration of the COVID-19 declaration under Section 564(b)(1) of the Act, 21 U.S.C.section 360bbb-3(b)(1), unless the authorization is terminated  or revoked sooner.       Influenza A by PCR NEGATIVE NEGATIVE Final   Influenza B by PCR NEGATIVE NEGATIVE Final    Comment: (NOTE) The Xpert Xpress SARS-CoV-2/FLU/RSV plus assay is intended as an aid in the diagnosis of influenza from Nasopharyngeal swab specimens and should not be used as a sole basis for treatment. Nasal washings and aspirates are unacceptable for Xpert Xpress SARS-CoV-2/FLU/RSV testing.  Fact Sheet for Patients: EntrepreneurPulse.com.au  Fact Sheet for Healthcare Providers: IncredibleEmployment.be  This test is not yet approved or cleared by the [place] FDA and has been authorized for detection and/or diagnosis of SARS-CoV-2 by FDA under an Emergency Use Authorization (EUA). This EUA will remain in effect (meaning this test can be used) for the duration of the COVID-19 declaration under Section 564(b)(1)  of the Act, 21 U.S.C. section 360bbb-3(b)(1), unless the authorization is terminated or revoked.  Performed at [facility], [address] [place], [place] [address]      Labs: Basic Metabolic Panel: Recent Labs  Lab 08/21/20 1503 08/22/20 0402  NA 129* 132*  K 3.9 3.5   CL 98 101  CO2 25 27  GLUCOSE 93 91  BUN 13 10  CREATININE 0.55 0.57  CALCIUM 9.0 8.5*   Liver Function Tests: Recent Labs  Lab 08/21/20 1503  AST 17  ALT 17  ALKPHOS 92  BILITOT 0.4  PROT 7.8  ALBUMIN 3.0*   CBC: Recent Labs  Lab 08/21/20 1503 08/22/20 0402  WBC 12.0* 10.8*  HGB 13.7 12.0  HCT 40.1 35.7*  MCV 94.8 94.9  PLT 288 271    Signed:  Barton Dubois MD.  [facility] 08/22/2020, 12:41 PM

## 2020-08-23 ENCOUNTER — Telehealth: Payer: Self-pay | Admitting: *Deleted

## 2020-08-23 NOTE — Telephone Encounter (Signed)
[place], Sheri Pope, RMA TRIAGE         Previous Messages    ----- Message -----  From: Cheron Every, CMA  Sent: 08/23/2020   9:12 AM EDT  To: Metro Kung, RMA, Juliann Pares they will go to angie as she does triages  ----- Message -----  From: Zollie Pee  Sent: 08/23/2020   9:09 AM EDT  To: Cheron Every, CMA   FYI  ----- Message -----  From: Daneil Dolin, MD  Sent: 08/23/2020   8:42 AM EDT  To: [name]   No.  Just an updated triage.  ----- Message -----  From: Zollie Pee  Sent: 08/23/2020   8:21 AM EDT  To: Daneil Dolin, MD   Does she need to be scheduled for an office visit prior?  ----- Message -----  From: Daneil Dolin, MD  Sent: 08/22/2020  10:27 AM EDT  To: [name]   Seen by me over the weekend.  Painless rectal bleeding.  Stable discharged on Sunday.   Needs an outpatient colonoscopy and possible EGD.  Hematochezia.  History of peptic ulcer disease.  ASA 3/propofol.  Thanks    Pt scheduled for nurse visit by telephone on 08/25/2020 at 2:00.

## 2020-08-25 ENCOUNTER — Ambulatory Visit (INDEPENDENT_AMBULATORY_CARE_PROVIDER_SITE_OTHER): Payer: Self-pay | Admitting: *Deleted

## 2020-08-25 VITALS — Ht 61.0 in | Wt 97.8 lb

## 2020-08-25 DIAGNOSIS — Z8711 Personal history of peptic ulcer disease: Secondary | ICD-10-CM

## 2020-08-25 DIAGNOSIS — K921 Melena: Secondary | ICD-10-CM

## 2020-08-25 MED ORDER — NA SULFATE-K SULFATE-MG SULF 17.5-3.13-1.6 GM/177ML PO SOLN: 1.0000 | Freq: Once | ORAL | 0 refills | Status: AC

## 2020-08-25 NOTE — Progress Notes (Addendum)
Gastroenterology Pre-Procedure Review  Request Date: 08/25/2020 Requesting Physician: triage per Dr. Gala Romney, hematochezia, hx of peptic ulcer disease  PATIENT REVIEW QUESTIONS: The patient responded to the following health history questions as indicated:    1. Diabetes Melitis: no 2. Joint replacements in the past 12 months: no 3. Major health problems in the past 3 months: yes, low white blood cell count 4. Has an artificial valve or MVP:  5. Has a defibrillator:  6. Has been advised in past to take antibiotics in advance of a procedure like teeth cleaning:  7. Family history of colon cancer:   8. Alcohol Use:  9. Illicit drug Use:  10. History of sleep apnea:   11. History of coronary artery or other vascular stents placed within the last 12 months:  12. History of any prior anesthesia complications:  13. Body mass index is 18.48 kg/m.    MEDICATIONS & ALLERGIES:    Patient reports the following regarding taking any blood thinners:   Plavix? no Aspirin? no Coumadin? no Brilinta? no Xarelto? no Eliquis? no Pradaxa? no Savaysa? no Effient? no  Patient confirms/reports the following medications:  Current Outpatient Medications  Medication Sig Dispense Refill   ALPRAZolam (XANAX) 0.25 MG tablet Take 0.25 mg by mouth at bedtime. Take 1/2 tablet every evening     diclofenac sodium (VOLTAREN) 1 % GEL Apply 2 g topically 4 (four) times daily. 100 g 3   HYDROcodone-acetaminophen (NORCO/VICODIN) 5-325 MG tablet Take 0.5-1 tablets by mouth 2 (two) times daily. Takes 1/2 tablet twice daily.     hydrocortisone (ANUSOL-HC) 25 MG suppository Place 1 suppository (25 mg total) rectally 2 (two) times daily. 12 suppository 0   lactose free nutrition (BOOST) LIQD Take 1 Container by mouth daily.     methocarbamol (ROBAXIN) 500 MG tablet Take 1 tablet by mouth 2 (two) times daily.     pantoprazole (PROTONIX) 40 MG tablet Take 1 tablet (40 mg total) by mouth daily. 30 tablet 1   Polyvinyl  Alcohol-Povidone (REFRESH OP) Place 1 drop into both eyes 3 (three) times daily.     No current facility-administered medications for this visit.    Patient confirms/reports the following allergies:  Allergies  Allergen Reactions   Codeine Nausea And Vomiting   Prednisone Other (See Comments)    Thought she was having a heart attack   Sulfa Antibiotics Other (See Comments)    unknown   Aleve [Naproxen] Anxiety    Jittery and hot flashes   Latex Rash    No orders of the defined types were placed in this encounter.   AUTHORIZATION INFORMATION Primary Insurance: [place],  ID #: [id number] Pre-Cert / Josem Kaufmann required: Yes, approved online 04/09/1733-6/70/1410 Pre-Cert / Josem Kaufmann #: [id number]   SCHEDULE INFORMATION: Procedure has been scheduled as follows:  Date: 10/14/2020, Time: 12:30  Location: [facility] with Dr. Gala Romney  This Gastroenterology Pre-Precedure Review Form is being routed to the following provider(s): Dr. Gala Romney

## 2020-08-25 NOTE — Progress Notes (Signed)
[address], [place] [address]   CLINIC:  Medical Oncology/Hematology  PCP:  Celene Squibb, MD [address] [place] [address]  [phone]  REASON FOR VISIT:  Follow-up for lymphocytosis and neutropenia  PRIOR THERAPY: Splenectomy in 1970's  CURRENT THERAPY: observation  INTERVAL HISTORY:  [name], a 80 y.o. female, was seen for follow-up of severe neutropenia.  Since last visit, she had a BMB, and she is here for results She had a couple episodes of cellulitis requiring abx and recently went to the ED for BRBPR She is scheduled for endoscopy and colonoscopy mid Aug. No more episodes of hematochezia since Saturday Otherwise reports pain from arthritis, chronic left hand numbness Rest of the pertinent 10 point ROS reviewed and neg.  PAST MEDICAL/SURGICAL HISTORY:  Past Medical History:  Diagnosis Date   Arthritis    RA AND OA --PAIN AND SWELLIN IN LEFT KNEE   Blood in urine    NEGATIVE UROLOGY WORK UP --BUT ALWAYS HAS BLOOD IN URINE   GERD (gastroesophageal reflux disease)    H/O hiatal hernia    Hyperlipidemia    Leukemia (HCC)    PONV (postoperative nausea and vomiting)    ALWAYS SICK AFTER SURGERIES EXCEPT AFTER HIP REPLACMENT 2007   Vitamin D deficiency    Past Surgical History:  Procedure Laterality Date   ABDOMINAL HYSTERECTOMY  1870'S   BREAST SURGERY     BREAST BIOSPIES   CATARACT EXTRACTION W/PHACO Left 05/30/2012   Procedure: CATARACT EXTRACTION PHACO AND INTRAOCULAR LENS PLACEMENT ([facility]);  Surgeon: Tonny Branch, MD;  Location: [facility];  Service: Ophthalmology;  Laterality: Left;  CDE: 16.99   CATARACT EXTRACTION W/PHACO Right 06/24/2012   Procedure: CATARACT EXTRACTION PHACO AND INTRAOCULAR LENS PLACEMENT ([facility]);  Surgeon: Tonny Branch, MD;  Location: [facility];  Service: Ophthalmology;  Laterality: Right;  CDE:17.92   HIP SURGERY     JOINT REPLACEMENT  2007   RIGHT HIP REPLACEMENT   LEFT KNEE ARTHROSCOPY   AUG 2012   MULTIPLE ORTHOPEDIC SURGERIES     ON BOTH HANDS AND BOTH FEET, RIGHT ELBOW   SPLEENECTOMY  IN THE 70'S   FOR LARGE CYST   TOTAL KNEE ARTHROPLASTY  11/10/2011   Procedure: TOTAL KNEE ARTHROPLASTY;  Surgeon: Mcarthur Rossetti, MD;  Location: [facility];  Service: Orthopedics;  Laterality: Left;  Left Total Knee Arthroplasty   YAG LASER APPLICATION Bilateral    Dr. Geoffry Paradise    SOCIAL HISTORY:  Social History   Socioeconomic History   Marital status: Widowed    Spouse name: Not on file   Number of children: Not on file   Years of education: Not on file   Highest education level: Not on file  Occupational History   Occupation: retired  Tobacco Use   Smoking status: Every Day    Packs/day: 1.00    Years: 50.00    Pack years: 50.00    Types: Cigarettes   Smokeless tobacco: Never  Substance and Sexual Activity   Alcohol use: No   Drug use: No   Sexual activity: Not Currently    Birth control/protection: Surgical  Other Topics Concern   Not on file  Social History Narrative   Not on file   Social Determinants of Health   Financial Resource Strain: Not on file  Food Insecurity: Not on file  Transportation Needs: Not on file  Physical Activity: Not on file  Stress: Not on  file  Social Connections: Not on file  Intimate Partner Violence: Not on file    FAMILY HISTORY:  Family History  Problem Relation Age of Onset   Diabetes Sister    Macular degeneration Sister    Heart attack Mother    Stroke Father    Dementia Father    Cancer Brother    Healthy Sister    Healthy Sister    Diabetes Brother     CURRENT MEDICATIONS:  Current Outpatient Medications  Medication Sig Dispense Refill   ALPRAZolam (XANAX) 0.25 MG tablet Take 0.25 mg by mouth at bedtime. Take 1/2 tablet every evening     diclofenac sodium (VOLTAREN) 1 % GEL Apply 2 g topically 4 (four) times daily. 100 g 3   HYDROcodone-acetaminophen (NORCO/VICODIN) 5-325 MG tablet Take 0.5-1 tablets by mouth  2 (two) times daily. Takes 1/2 tablet twice daily.     hydrocortisone (ANUSOL-HC) 25 MG suppository Place 1 suppository (25 mg total) rectally 2 (two) times daily. 12 suppository 0   lactose free nutrition (BOOST) LIQD Take 1 Container by mouth daily.     methocarbamol (ROBAXIN) 500 MG tablet Take 1 tablet by mouth 2 (two) times daily.     pantoprazole (PROTONIX) 40 MG tablet Take 1 tablet (40 mg total) by mouth daily. 30 tablet 1   Polyvinyl Alcohol-Povidone (REFRESH OP) Place 1 drop into both eyes 3 (three) times daily.     No current facility-administered medications for this visit.    ALLERGIES:  Allergies  Allergen Reactions   Codeine Nausea And Vomiting   Prednisone Other (See Comments)    Thought she was having a heart attack   Sulfa Antibiotics Other (See Comments)    unknown   Aleve [Naproxen] Anxiety    Jittery and hot flashes   Latex Rash    PHYSICAL EXAM:  Performance status (ECOG): 1 - Symptomatic but completely ambulatory  Vitals:   08/26/20 0856  BP: (!) 148/59  Pulse: 96  Resp: 18  Temp: 98.7 F (37.1 C)   Wt Readings from Last 3 Encounters:  08/26/20 98 lb 3.2 oz (44.5 kg)  08/25/20 97 lb 12.8 oz (44.4 kg)  08/21/20 97 lb 9 oz (44.3 kg)   Physical Exam Vitals reviewed.  Constitutional:      Appearance: Normal appearance.  Cardiovascular:     Rate and Rhythm: Normal rate and regular rhythm.     Pulses: Normal pulses.     Heart sounds: Normal heart sounds.  Pulmonary:     Effort: Pulmonary effort is normal.     Breath sounds: Normal breath sounds.  Chest:  Breasts:    Right: No axillary adenopathy or supraclavicular adenopathy.     Left: No axillary adenopathy or supraclavicular adenopathy.  Abdominal:     Palpations: There is no hepatomegaly, splenomegaly or mass.     Tenderness: There is no abdominal tenderness.  Musculoskeletal:        General: Deformity (arthritis in hands) present.  Lymphadenopathy:     Cervical: No cervical adenopathy.      Right cervical: No superficial cervical adenopathy.    Left cervical: No superficial cervical adenopathy.     Upper Body:     Right upper body: No supraclavicular or axillary adenopathy.     Left upper body: No supraclavicular or axillary adenopathy.     Lower Body: No right inguinal adenopathy. No left inguinal adenopathy.  Skin:    Comments: Rheumatoid nodules on shins bilaterally  Neurological:  General: No focal deficit present.     Mental Status: She is alert and oriented to person, place, and time.  Psychiatric:        Mood and Affect: Mood normal.        Behavior: Behavior normal.    LABORATORY DATA:  I have reviewed the labs as listed.  CBC Latest Ref Rng & Units 08/22/2020 08/21/2020 08/11/2020  WBC 4.0 - 10.5 K/uL 10.8(H) 12.0(H) 13.3(H)  Hemoglobin 12.0 - 15.0 g/dL 12.0 13.7 14.2  Hematocrit 36.0 - 46.0 % 35.7(L) 40.1 42.0  Platelets 150 - 400 K/uL 271 288 328   CMP Latest Ref Rng & Units 08/22/2020 08/21/2020 11/06/2019  Glucose 70 - 99 mg/dL 91 93 -  BUN 8 - 23 mg/dL 10 13 -  Creatinine 0.44 - 1.00 mg/dL 0.57 0.55 0.60  Sodium 135 - 145 mmol/L 132(L) 129(L) -  Potassium 3.5 - 5.1 mmol/L 3.5 3.9 -  Chloride 98 - 111 mmol/L 101 98 -  CO2 22 - 32 mmol/L 27 25 -  Calcium 8.9 - 10.3 mg/dL 8.5(L) 9.0 -  Total Protein 6.5 - 8.1 g/dL - 7.8 -  Total Bilirubin 0.3 - 1.2 mg/dL - 0.4 -  Alkaline Phos 38 - 126 U/L - 92 -  AST 15 - 41 U/L - 17 -  ALT 0 - 44 U/L - 17 -      Component Value Date/Time   RBC 3.76 (L) 08/22/2020 0402   MCV 94.9 08/22/2020 0402   MCH 31.9 08/22/2020 0402   MCHC 33.6 08/22/2020 0402   RDW 18.2 (H) 08/22/2020 0402   LYMPHSABS 12.5 (H) 08/11/2020 0810   MONOABS 0.3 08/11/2020 0810   EOSABS 0.0 08/11/2020 0810   BASOSABS 0.1 08/11/2020 0810    DIAGNOSTIC IMAGING:  I have independently reviewed the scans and discussed with the patient. CT ABDOMEN PELVIS W CONTRAST  Result Date: 08/21/2020 CLINICAL DATA:  Melena GI bleeding and epigastric  pain. Blood in stool x 2 days. Hx of leukemia, hiatal hernia, hysterectomy, splenectomy. No oral per provider. Denies pain, N/V/D. EXAM: CT ABDOMEN AND PELVIS WITH CONTRAST TECHNIQUE: Multidetector CT imaging of the abdomen and pelvis was performed using the standard protocol following bolus administration of intravenous contrast. CONTRAST:  62mL OMNIPAQUE IOHEXOL 300 MG/ML  SOLN COMPARISON:  CT abdomen pelvis 11/06/2019, CT chest 12/01/2004 FINDINGS: Lower chest: No acute abnormality. Hepatobiliary: Stable 1 cm per peripheral left hepatic lobe lesion that is too small to characterize. No focal liver abnormality. No gallstones, gallbladder wall thickening, or pericholecystic fluid. No biliary dilatation. Pancreas: No focal lesion. Normal pancreatic contour. No surrounding inflammatory changes. No main pancreatic ductal dilatation. Spleen: Status post splenectomy. Left upper quadrant 3 cm soft tissue density suggestive of splenosis. Adrenals/Urinary Tract: No adrenal nodule bilaterally. Bilateral kidneys enhance symmetrically. No hydronephrosis. No hydroureter. The urinary bladder is unremarkable. Stomach/Bowel: Stomach is within normal limits. No evidence of bowel wall thickening or dilatation. Mobile medialized cecum. Scattered colonic diverticulosis. Appendix not definitely identified with no evidence of inflammatory changes to suggest acute appendicitis. Vascular/Lymphatic: No abdominal aorta or iliac aneurysm. Severe atherosclerotic plaque of the aorta and its branches. No abdominal, pelvic, or inguinal lymphadenopathy. Reproductive: Limited evaluation of the pelvis due to streak artifact originating from a right hip arthroplasty. Status post hysterectomy. No adnexal masses. Other: No intraperitoneal free fluid. No intraperitoneal free gas. No organized fluid collection. Musculoskeletal: No abdominal wall hernia or abnormality. No suspicious lytic or blastic osseous lesions. No acute displaced fracture.  Multilevel  degenerative changes of the spine. Status post partially visualized total right hip arthroplasty. IMPRESSION: 1. No acute intra-abdominal or intrapelvic abnormality on this single phase portal venous study. 2. Scattered colonic diverticulosis with no acute diverticulitis. 3. Similar in size indeterminate 1 cm left hepatic lesion. Given chronicity, finding likely benign. Findings suggestive of left upper quadrant splenosis in a patient status post splenectomy. 4.  Aortic Atherosclerosis (ICD10-I70.0). Electronically Signed   By: Iven Finn M.D.   On: 08/21/2020 17:15     ASSESSMENT:  1 Severe neutropenia. -CBC on 10-13-2019 shows white count 13.5, with differential showing 2% neutrophils, 94% lymphocytes, 3% monocytes and 1% basophils. -Hemoglobin and platelet count were normal. -She had history of splenectomy at age 78 secondary to a cyst on it. -She reported 5 to 10 pound weight loss in the last 6 months but denies any fevers or night sweats. -Reports decreased appetite and decreased energy levels for the last 5 to 6 weeks. -CT CAP on 11/06/2019 showed a borderline prominent right axillary lymph node, 1.4 cm right upper normal-sized left axillary and right subpectoral lymph nodes with no overtly pathologic adenopathy.  Mild chronic diffuse prominence of thyroid gland without discrete nodule. - T-cell receptor gene rearrangement showed clonal T-cell gamma population as well as clonal T-cell beta population detected.  This usually reflects presence of T-cell lymphocytic neoplasm.   2.  Social/family history: -She lives at home with her nephew.  She did office work prior to retirement. -She is a current active smoker, 1 pack/day for 50+ years. -Brother had cancer, type unknown.   3.  Rheumatoid arthritis: -She has rheumatoid arthritis of the extremities, predominantly in the upper extremities with rheumatoid nodules. -Uses Voltaren and aspirin cream.   PLAN:  1.  Lymphocytic  leukocytosis: - She does not report any B symptoms. - Reviewed BMB results, suggests T cell LGL. Discussed this is not curable and most pts are asymptomatic but eventually may need treatment. - Given lack of symptoms or repeated infections requiring hospitalization, we discussed about MTX down the lane but may not need to start it right away. She understands she is at high risk for infections. She said she cant take prednisone Discussed about symptoms to seek appointment for Neutropenic precautions. She can RTC in 3 months as scheduled.   2.  Weight loss: - CT CAP on 11/06/2019 did not show any clear etiology for weight loss. - weight loss, she is on Marinol per Dr Raliegh Ip. No major changes since last visit.  Orders placed this encounter:  No orders of the defined types were placed in this encounter.  Benay Pike MD

## 2020-08-25 NOTE — Patient Instructions (Signed)
Sheri Pope  12/01/1940 MRN: [id number]     Procedure Date: 10/14/2020 Time to register: 11:30 AM Place to register: [facility] Scheduled provider: Dr. Gala Romney    PREPARATION FOR COLONOSCOPY WITH SUPREP BOWEL PREP KIT  Note: Suprep Bowel Prep Kit is a split-dose (2day) regimen. Consumption of BOTH 6-ounce bottles is required for a complete prep.  Please notify us immediately if you are diabetic, take iron supplements, or if you are on Coumadin or any other blood thinners.  Please hold the following medications: n/a                                                                                                                                                  2 DAYS BEFORE PROCEDURE:  DATE: 10/12/2020   DAY: Tuesday Begin clear liquid diet AFTER your lunch meal. NO SOLID FOODS after this point.  1 DAY BEFORE PROCEDURE:  DATE: 10/13/2020   DAY: Wednesday Continue clear liquids the entire day - NO SOLID FOOD.   Diabetic medications adjustments for today: n/a  At 6:00pm: Complete steps 1 through 4 below, using ONE (1) 6-ounce bottle, before going to bed. Step 1:  Pour ONE (1) 6-ounce bottle of SUPREP liquid into the mixing container.  Step 2:  Add cool drinking water to the 16 ounce line on the container and mix.  Note: Dilute the solution concentrate as directed prior to use. Step 3:  DRINK ALL the liquid in the container. Step 4:  You MUST drink an additional two (2) or more 16 ounce containers of water over the next one (1) hour.   Continue clear liquids.  DAY OF PROCEDURE:   DATE: 10/14/2020   DAY: Thursday If you take medications for your heart, blood pressure, or breathing, you may take these medications.  Diabetic medications adjustments for today: n/a  5 hours before your procedure at 7:30 am : Step 1:  Pour ONE (1) 6-ounce bottle of SUPREP liquid into the mixing container.  Step 2:  Add cool drinking water to the 16 ounce line on the container and mix.  Note:  Dilute the solution concentrate as directed prior to use. Step 3:  DRINK ALL the liquid in the container. Step 4:  You MUST drink an additional two (2) or more 16 ounce containers of water over the next one (1) hour. You MUST complete the final glass of water at least 4 hours before your colonoscopy. Nothing by mouth past 8:30 AM.  You may take your morning medications with sip of water unless we have instructed otherwise.    Please see below for Dietary Information.  CLEAR LIQUIDS INCLUDE:  Water Jello (NOT red in color)   Ice Popsicles (NOT red in color)   Tea (sugar ok, no milk/cream) Powdered fruit flavored drinks  Coffee (sugar ok, no milk/cream) Gatorade/ Lemonade/  Kool-Aid  (NOT red in color)   Juice: apple, white grape, white cranberry Soft drinks  Clear bullion, consomme, broth (fat free beef/chicken/vegetable)  Carbonated beverages (any kind)  Strained chicken noodle soup Hard Candy   Remember: Clear liquids are liquids that will allow you to see your fingers on the other side of a clear glass. Be sure liquids are NOT red in color, and not cloudy, but CLEAR.  DO NOT EAT OR DRINK ANY OF THE FOLLOWING:  Dairy products of any kind   Cranberry juice Tomato juice / V8 juice   Grapefruit juice Orange juice     Red grape juice  Do not eat any solid foods, including such foods as: cereal, oatmeal, yogurt, fruits, vegetables, creamed soups, eggs, bread, crackers, pureed foods in a blender, etc.   HELPFUL HINTS FOR DRINKING PREP SOLUTION:  Make sure prep is extremely cold. Mix and refrigerate the the morning of the prep. You may also put in the freezer.  You may try mixing some Crystal Light or Country Time Lemonade if you prefer. Mix in small amounts; add more if necessary. Try drinking through a straw Rinse mouth with water or a mouthwash between glasses, to remove after-taste. Try sipping on a cold beverage /ice/ popsicles between glasses of prep. Place a piece of sugar-free  hard candy in mouth between glasses. If you become nauseated, try consuming smaller amounts, or stretch out the time between glasses. Stop for 30-60 minutes, then slowly start back drinking.     OTHER INSTRUCTIONS  You will need a responsible adult at least 78 years of age to accompany you and drive you home. This person must remain in the waiting room during your procedure. The hospital will cancel your procedure if you do not have a responsible adult with you.   Wear loose fitting clothing that is easily removed. Leave jewelry and other valuables at home.  Remove all body piercing jewelry and leave at home. Total time from sign-in until discharge is approximately 2-3 hours. You should go home directly after your procedure and rest. You can resume normal activities the day after your procedure. The day of your procedure you should not: Drive Make legal decisions Operate machinery Drink alcohol Return to work   You may call the office (Dept: [phone]) before 5:00pm, or page the doctor on call [phone]) after 5:00pm, for further instructions, if necessary.   Insurance Information YOU WILL NEED TO CHECK WITH YOUR INSURANCE COMPANY FOR THE BENEFITS OF COVERAGE YOU HAVE FOR THIS PROCEDURE.  UNFORTUNATELY, NOT ALL INSURANCE COMPANIES HAVE BENEFITS TO COVER ALL OR PART OF THESE TYPES OF PROCEDURES.  IT IS YOUR RESPONSIBILITY TO CHECK YOUR BENEFITS, HOWEVER, WE WILL BE GLAD TO ASSIST YOU WITH ANY CODES YOUR INSURANCE COMPANY MAY NEED.    PLEASE NOTE THAT MOST INSURANCE COMPANIES WILL NOT COVER A SCREENING COLONOSCOPY FOR PEOPLE UNDER THE AGE OF 50  IF YOU HAVE BCBS INSURANCE, YOU MAY HAVE BENEFITS FOR A SCREENING COLONOSCOPY BUT IF POLYPS ARE FOUND THE DIAGNOSIS WILL CHANGE AND THEN YOU MAY HAVE A DEDUCTIBLE THAT WILL NEED TO BE MET. SO PLEASE MAKE SURE YOU CHECK YOUR BENEFITS FOR A SCREENING COLONOSCOPY AS WELL AS A DIAGNOSTIC COLONOSCOPY.

## 2020-08-26 ENCOUNTER — Other Ambulatory Visit: Payer: Self-pay

## 2020-08-26 ENCOUNTER — Inpatient Hospital Stay (HOSPITAL_BASED_OUTPATIENT_CLINIC_OR_DEPARTMENT_OTHER): Payer: Medicare HMO | Admitting: Hematology and Oncology

## 2020-08-26 ENCOUNTER — Encounter (HOSPITAL_COMMUNITY): Payer: Self-pay | Admitting: Hematology and Oncology

## 2020-08-26 ENCOUNTER — Encounter: Payer: Self-pay | Admitting: *Deleted

## 2020-08-26 ENCOUNTER — Other Ambulatory Visit: Payer: Self-pay | Admitting: *Deleted

## 2020-08-26 VITALS — BP 148/59 | HR 96 | Temp 98.7°F | Resp 18 | Wt 98.2 lb

## 2020-08-26 DIAGNOSIS — K5730 Diverticulosis of large intestine without perforation or abscess without bleeding: Secondary | ICD-10-CM | POA: Diagnosis not present

## 2020-08-26 DIAGNOSIS — C91Z0 Other lymphoid leukemia not having achieved remission: Secondary | ICD-10-CM | POA: Diagnosis not present

## 2020-08-26 DIAGNOSIS — E785 Hyperlipidemia, unspecified: Secondary | ICD-10-CM | POA: Diagnosis not present

## 2020-08-26 DIAGNOSIS — R250 Abnormal head movements: Secondary | ICD-10-CM | POA: Diagnosis not present

## 2020-08-26 DIAGNOSIS — E559 Vitamin D deficiency, unspecified: Secondary | ICD-10-CM | POA: Diagnosis not present

## 2020-08-26 DIAGNOSIS — F17210 Nicotine dependence, cigarettes, uncomplicated: Secondary | ICD-10-CM | POA: Diagnosis not present

## 2020-08-26 DIAGNOSIS — D709 Neutropenia, unspecified: Secondary | ICD-10-CM | POA: Diagnosis not present

## 2020-08-26 DIAGNOSIS — R634 Abnormal weight loss: Secondary | ICD-10-CM | POA: Diagnosis not present

## 2020-08-26 DIAGNOSIS — M069 Rheumatoid arthritis, unspecified: Secondary | ICD-10-CM | POA: Diagnosis not present

## 2020-08-26 DIAGNOSIS — K769 Liver disease, unspecified: Secondary | ICD-10-CM | POA: Diagnosis not present

## 2020-08-26 DIAGNOSIS — K219 Gastro-esophageal reflux disease without esophagitis: Secondary | ICD-10-CM | POA: Diagnosis not present

## 2020-08-26 NOTE — Progress Notes (Signed)
Rourk, Cristopher Estimable, MD  Metro Kung, RMA  Triage reviewed.  You are correct.  Go ahead and schedule colonoscopy and possible EGD to follow-up.  ASA 3 propofol.  Lets get an H&H on her when shecomes for her preop visit or morning of the procedure.  Thanks

## 2020-08-27 ENCOUNTER — Encounter (HOSPITAL_COMMUNITY): Payer: Self-pay

## 2020-08-27 ENCOUNTER — Telehealth: Payer: Self-pay | Admitting: *Deleted

## 2020-08-27 ENCOUNTER — Other Ambulatory Visit: Payer: Self-pay | Admitting: *Deleted

## 2020-08-27 ENCOUNTER — Encounter (HOSPITAL_COMMUNITY): Payer: Self-pay | Admitting: Hematology

## 2020-08-27 NOTE — Telephone Encounter (Signed)
Spoke to pt yesterday afternoon.  She is aware that she will need Pre-op appointment.  Informed her that it is scheduled for 10/12/2020 at 10:30.  Pt aware of procedure on 10/14/2020 with arrival at 11:30.  Mailing out prep instructions and Pre-op appointment letter.

## 2020-09-27 DIAGNOSIS — L988 Other specified disorders of the skin and subcutaneous tissue: Secondary | ICD-10-CM | POA: Diagnosis not present

## 2020-09-27 DIAGNOSIS — M069 Rheumatoid arthritis, unspecified: Secondary | ICD-10-CM | POA: Diagnosis not present

## 2020-09-27 DIAGNOSIS — K625 Hemorrhage of anus and rectum: Secondary | ICD-10-CM | POA: Diagnosis not present

## 2020-09-27 DIAGNOSIS — K648 Other hemorrhoids: Secondary | ICD-10-CM | POA: Diagnosis not present

## 2020-10-06 ENCOUNTER — Encounter (HOSPITAL_COMMUNITY): Payer: Self-pay

## 2020-10-06 NOTE — Patient Instructions (Signed)
Your procedure is scheduled on: 10/14/2020  Report to [facility] at  11:00   AM.  Call this number if you have problems the morning of surgery: [phone]   Remember:              Follow Directions on the letter you received from Your Physician's office regarding the Bowel Prep              No Smoking the day of Procedure :   Take these medicines the morning of surgery with A SIP OF WATER: xanax and/or hydrocodone if needed   Do not wear jewelry, make-up or nail polish.    Do not bring valuables to the hospital.  Contacts, dentures or bridgework may not be worn into surgery.  .   Patients discharged the day of surgery will not be allowed to drive home.     Colonoscopy, Adult, Care After This sheet gives you information about how to care for yourself after your procedure. Your health care provider may also give you more specific instructions. If you have problems or questions, contact your health care provider. What can I expect after the procedure? After the procedure, it is common to have: A small amount of blood in your stool for 24 hours after the procedure. Some gas. Mild abdominal cramping or bloating.  Follow these instructions at home: General instructions  For the first 24 hours after the procedure: Do not drive or use machinery. Do not sign important documents. Do not drink alcohol. Do your regular daily activities at a slower pace than normal. Eat soft, easy-to-digest foods. Rest often. Take over-the-counter or prescription medicines only as told by your health care provider. It is up to you to get the results of your procedure. Ask your health care provider, or the department performing the procedure, when your results will be ready. Relieving cramping and bloating Try walking around when you have cramps or feel bloated. Apply heat to your abdomen as told by your health care provider. Use a heat source that your health care provider recommends, such as a  moist heat pack or a heating pad. Place a towel between your skin and the heat source. Leave the heat on for 20-30 minutes. Remove the heat if your skin turns bright red. This is especially important if you are unable to feel pain, heat, or cold. You may have a greater risk of getting burned. Eating and drinking Drink enough fluid to keep your urine clear or pale yellow. Resume your normal diet as instructed by your health care provider. Avoid heavy or fried foods that are hard to digest. Avoid drinking alcohol for as long as instructed by your health care provider. Contact a health care provider if: You have blood in your stool 2-3 days after the procedure. Get help right away if: You have more than a small spotting of blood in your stool. You pass large blood clots in your stool. Your abdomen is swollen. You have nausea or vomiting. You have a fever. You have increasing abdominal pain that is not relieved with medicine. This information is not intended to replace advice given to you by your health care provider. Make sure you discuss any questions you have with your health care provider. Document Released: 09/28/2003 Document Revised: 11/08/2015 Document Reviewed: 04/27/2015 Elsevier Interactive Patient Education  2018 [facility], Adult, Care After This sheet gives you information about how to care for yourself after your procedure. Your health care provider  may also give you more specific instructions. If you have problems or questions, contact your health careprovider. What can I expect after the procedure? After the procedure, it is common to have: A sore throat. Mild stomach pain or discomfort. Bloating. Nausea. Follow these instructions at home:  Follow instructions from your health care provider about what to eat or drink after your procedure. Return to your normal activities as told by your health care provider. Ask your health care provider what  activities are safe for you. Take over-the-counter and prescription medicines only as told by your health care provider. If you were given a sedative during the procedure, it can affect you for several hours. Do not drive or operate machinery until your health care provider says that it is safe. Keep all follow-up visits as told by your health care provider. This is important. Contact a health care provider if you have: A sore throat that lasts longer than one day. Trouble swallowing. Get help right away if: You vomit blood or your vomit looks like coffee grounds. You have: A fever. Bloody, black, or tarry stools. A severe sore throat or you cannot swallow. Difficulty breathing. Severe pain in your chest or abdomen. Summary After the procedure, it is common to have a sore throat, mild stomach discomfort, bloating, and nausea. If you were given a sedative during the procedure, it can affect you for several hours. Do not drive or operate machinery until your health care provider says that it is safe. Follow instructions from your health care provider about what to eat or drink after your procedure. Return to your normal activities as told by your health care provider. This information is not intended to replace advice given to you by your health care provider. Make sure you discuss any questions you have with your healthcare provider. Document Revised: 02/11/2019 Document Reviewed: 07/16/2017 Elsevier Patient Education  2022 [employer].

## 2020-10-12 ENCOUNTER — Encounter (HOSPITAL_COMMUNITY): Payer: Self-pay

## 2020-10-12 ENCOUNTER — Other Ambulatory Visit: Payer: Self-pay

## 2020-10-12 ENCOUNTER — Encounter (HOSPITAL_COMMUNITY)
Admission: RE | Admit: 2020-10-12 | Discharge: 2020-10-12 | Disposition: A | Discharge status: Home / Self Care | Payer: Medicare HMO | Source: Ambulatory Visit | Attending: Internal Medicine | Admitting: Internal Medicine

## 2020-10-12 DIAGNOSIS — Z01812 Encounter for preprocedural laboratory examination: Secondary | ICD-10-CM | POA: Insufficient documentation

## 2020-10-12 LAB — CBC
HCT: 39.1 % (ref 36.0–46.0)
Hemoglobin: 13.4 g/dL (ref 12.0–15.0)
MCH: 32.9 pg (ref 26.0–34.0)
MCHC: 34.3 g/dL (ref 30.0–36.0)
MCV: 96.1 fL (ref 80.0–100.0)
Platelets: 329 10*3/uL (ref 150–400)
RBC: 4.07 MIL/uL (ref 3.87–5.11)
RDW: 18.4 % — ABNORMAL HIGH (ref 11.5–15.5)
WBC: 14.2 10*3/uL — ABNORMAL HIGH (ref 4.0–10.5)
nRBC: 0 % (ref 0.0–0.2)

## 2020-10-14 ENCOUNTER — Ambulatory Visit (HOSPITAL_COMMUNITY): Payer: Medicare HMO | Admitting: Anesthesiology

## 2020-10-14 ENCOUNTER — Encounter (HOSPITAL_COMMUNITY)
Admission: RE | Disposition: A | Discharge status: Home / Self Care | Payer: Self-pay | Source: Home / Self Care | Attending: Internal Medicine

## 2020-10-14 ENCOUNTER — Ambulatory Visit (HOSPITAL_COMMUNITY)
Admission: RE | Admit: 2020-10-14 | Discharge: 2020-10-14 | Disposition: A | Discharge status: Home / Self Care | Payer: Medicare HMO | Attending: Internal Medicine | Admitting: Internal Medicine

## 2020-10-14 DIAGNOSIS — Z885 Allergy status to narcotic agent status: Secondary | ICD-10-CM | POA: Insufficient documentation

## 2020-10-14 DIAGNOSIS — Z882 Allergy status to sulfonamides status: Secondary | ICD-10-CM | POA: Insufficient documentation

## 2020-10-14 DIAGNOSIS — K921 Melena: Secondary | ICD-10-CM | POA: Diagnosis not present

## 2020-10-14 DIAGNOSIS — Z96652 Presence of left artificial knee joint: Secondary | ICD-10-CM | POA: Insufficient documentation

## 2020-10-14 DIAGNOSIS — R1310 Dysphagia, unspecified: Secondary | ICD-10-CM | POA: Diagnosis not present

## 2020-10-14 DIAGNOSIS — Z90710 Acquired absence of both cervix and uterus: Secondary | ICD-10-CM | POA: Insufficient documentation

## 2020-10-14 DIAGNOSIS — Z888 Allergy status to other drugs, medicaments and biological substances status: Secondary | ICD-10-CM | POA: Diagnosis not present

## 2020-10-14 DIAGNOSIS — Z886 Allergy status to analgesic agent status: Secondary | ICD-10-CM | POA: Insufficient documentation

## 2020-10-14 DIAGNOSIS — K222 Esophageal obstruction: Secondary | ICD-10-CM | POA: Diagnosis not present

## 2020-10-14 DIAGNOSIS — K640 First degree hemorrhoids: Secondary | ICD-10-CM | POA: Diagnosis not present

## 2020-10-14 DIAGNOSIS — Z91040 Latex allergy status: Secondary | ICD-10-CM | POA: Insufficient documentation

## 2020-10-14 DIAGNOSIS — K219 Gastro-esophageal reflux disease without esophagitis: Secondary | ICD-10-CM | POA: Diagnosis not present

## 2020-10-14 DIAGNOSIS — E785 Hyperlipidemia, unspecified: Secondary | ICD-10-CM | POA: Diagnosis not present

## 2020-10-14 DIAGNOSIS — Z96641 Presence of right artificial hip joint: Secondary | ICD-10-CM | POA: Insufficient documentation

## 2020-10-14 DIAGNOSIS — K449 Diaphragmatic hernia without obstruction or gangrene: Secondary | ICD-10-CM | POA: Diagnosis not present

## 2020-10-14 DIAGNOSIS — Z8249 Family history of ischemic heart disease and other diseases of the circulatory system: Secondary | ICD-10-CM | POA: Insufficient documentation

## 2020-10-14 DIAGNOSIS — Z856 Personal history of leukemia: Secondary | ICD-10-CM | POA: Diagnosis not present

## 2020-10-14 DIAGNOSIS — F17210 Nicotine dependence, cigarettes, uncomplicated: Secondary | ICD-10-CM | POA: Diagnosis not present

## 2020-10-14 DIAGNOSIS — K5730 Diverticulosis of large intestine without perforation or abscess without bleeding: Secondary | ICD-10-CM | POA: Diagnosis not present

## 2020-10-14 HISTORY — PX: MALONEY DILATION: SHX5535

## 2020-10-14 HISTORY — PX: ESOPHAGOGASTRODUODENOSCOPY (EGD) WITH PROPOFOL: SHX5813

## 2020-10-14 HISTORY — PX: COLONOSCOPY WITH PROPOFOL: SHX5780

## 2020-10-14 SURGERY — COLONOSCOPY WITH PROPOFOL
Anesthesia: General

## 2020-10-14 MED ORDER — EPHEDRINE SULFATE 50 MG/ML IJ SOLN
INTRAMUSCULAR | Status: DC | PRN
  Administered 2020-10-14: 10 mg via INTRAVENOUS

## 2020-10-14 MED ORDER — LACTATED RINGERS IV SOLN: INTRAVENOUS | Status: DC

## 2020-10-14 MED ORDER — PROPOFOL 10 MG/ML IV BOLUS
INTRAVENOUS | Status: DC | PRN
  Administered 2020-10-14: 80 mg via INTRAVENOUS
  Administered 2020-10-14: 125 ug/kg/min via INTRAVENOUS

## 2020-10-14 MED ORDER — LIDOCAINE HCL (CARDIAC) PF 100 MG/5ML IV SOSY
PREFILLED_SYRINGE | INTRAVENOUS | Status: DC | PRN
  Administered 2020-10-14: 80 mg via INTRATRACHEAL

## 2020-10-14 MED ORDER — STERILE WATER FOR IRRIGATION IR SOLN
Status: DC | PRN
  Administered 2020-10-14: 200 mL

## 2020-10-14 NOTE — Transfer of Care (Signed)
Immediate Anesthesia Transfer of Care Note  Patient: Sheri Pope  Procedure(s) Performed: COLONOSCOPY WITH PROPOFOL ESOPHAGOGASTRODUODENOSCOPY (EGD) WITH PROPOFOL MALONEY DILATION  Patient Location: Short Stay  Anesthesia Type:General  Level of Consciousness: drowsy, patient cooperative and responds to stimulation  Airway & Oxygen Therapy: Patient Spontanous Breathing  Post-op Assessment: Report given to RN, Post -op Vital signs reviewed and stable and Patient moving all extremities X 4  Post vital signs: Reviewed and stable  Last Vitals:  Vitals Value Taken Time  BP    Temp    Pulse    Resp    SpO2      Last Pain:  Vitals:   10/14/20 1230  TempSrc:   PainSc: 4       Patients Stated Pain Goal: 8 [phone]  Complications: No notable events documented.

## 2020-10-14 NOTE — Op Note (Signed)
[facility] Patient Name: Sheri Pope Procedure Date: 10/14/2020 11:40 AM MRN: [id number] Date of Birth: 02-25-1941 Attending MD: Norvel Richards , MD CSN: [id number] Age: 80 Admit Type: Outpatient Procedure:                Upper GI endoscopy Indications:              Dysphagia Providers:                Norvel Richards, MD, Janeece Riggers, RN, Dereck Leep, Technician Referring MD:              Medicines:                Propofol per Anesthesia Complications:            No immediate complications. Estimated Blood Loss:     Estimated blood loss was minimal. Procedure:                Pre-Anesthesia Assessment:                           - Prior to the procedure, a History and Physical                            was performed, and patient medications and                            allergies were reviewed. The patient's tolerance of                            previous anesthesia was also reviewed. The risks                            and benefits of the procedure and the sedation                            options and risks were discussed with the patient.                            All questions were answered, and informed consent                            was obtained. Prior Anticoagulants: The patient has                            taken no previous anticoagulant or antiplatelet                            agents. ASA Grade Assessment: II - A patient with                            mild systemic disease. After reviewing the risks  and benefits, the patient was deemed in                            satisfactory condition to undergo the procedure.                           After obtaining informed consent, the endoscope was                            passed under direct vision. Throughout the                            procedure, the patient's blood pressure, pulse, and                            oxygen  saturations were monitored continuously. The                            [id number] scope was introduced through the                            mouth, and advanced to the second part of duodenum.                            The upper GI endoscopy was accomplished without                            difficulty. The patient tolerated the procedure                            well. Scope In: 12:40:20 PM Scope Out: 12:45:04 PM Total Procedure Duration: 0 hours 4 minutes 44 seconds  Findings:      A moderate Schatzki ring was found at the gastroesophageal junction.      A small hiatal hernia was present.      The exam was otherwise without abnormality.      The duodenal bulb and second portion of the duodenum were normal. Impression:               - Moderate Schatzki ring.                           - Small hiatal hernia.                           - The examination was otherwise normal.                           - Normal duodenal bulb and second portion of the                            duodenum.                           - No specimens collected. Moderate Sedation:      Moderate (conscious) sedation was personally administered by an  anesthesia professional. The following parameters were monitored: oxygen       saturation, heart rate, blood pressure, respiratory rate, EKG, adequacy       of pulmonary ventilation, and response to care. Recommendation:           - Patient has a contact number available for                            emergencies. The signs and symptoms of potential                            delayed complications were discussed with the                            patient. Return to normal activities tomorrow.                            Written discharge instructions were provided to the                            patient.                           - Resume previous diet.                           - Continue present medications.                           -See  colonoscopy report. Procedure Code(s):        --- Professional ---                           [phone], Esophagogastroduodenoscopy, flexible,                            transoral; diagnostic, including collection of                            specimen(s) by brushing or washing, when performed                            (separate procedure) Diagnosis Code(s):        --- Professional ---                           K22.2, Esophageal obstruction                           K44.9, Diaphragmatic hernia without obstruction or                            gangrene                           R13.10, Dysphagia, unspecified CPT copyright 2019 American Medical Association. All rights reserved. The codes documented in this report are preliminary and upon coder review may  be revised to meet  current compliance requirements. Cristopher Estimable. Anysia Choi, MD Norvel Richards, MD 10/14/2020 1:06:34 PM This report has been signed electronically. Number of Addenda: 0

## 2020-10-14 NOTE — Anesthesia Preprocedure Evaluation (Addendum)
Anesthesia Evaluation  Patient identified by MRN, date of birth, ID band Patient awake    Reviewed: Allergy & Precautions, NPO status , Patient's Chart, lab work & pertinent test results  History of Anesthesia Complications (+) PONV and history of anesthetic complications  Airway Mallampati: II  TM Distance: >3 FB Neck ROM: Full    Dental  (+) Dental Advisory Given, Edentulous Upper, Missing   Pulmonary Current SmokerPatient did not abstain from smoking.,    Pulmonary exam normal breath sounds clear to auscultation       Cardiovascular Exercise Tolerance: Good Normal cardiovascular exam Rhythm:Regular Rate:Normal     Neuro/Psych negative neurological ROS  negative psych ROS   GI/Hepatic Neg liver ROS, hiatal hernia, GERD  Medicated,  Endo/Other  negative endocrine ROS  Renal/GU negative Renal ROS     Musculoskeletal  (+) Arthritis , Osteoarthritis and Rheumatoid disorders,    Abdominal   Peds  Hematology negative hematology ROS (+)   Anesthesia Other Findings Splenectomy   Reproductive/Obstetrics                            Anesthesia Physical Anesthesia Plan  ASA: 3  Anesthesia Plan: General   Post-op Pain Management:    Induction: Intravenous  PONV Risk Score and Plan: Propofol infusion  Airway Management Planned: Nasal Cannula and Natural Airway  Additional Equipment:   Intra-op Plan:   Post-operative Plan:   Informed Consent: I have reviewed the patients History and Physical, chart, labs and discussed the procedure including the risks, benefits and alternatives for the proposed anesthesia with the patient or authorized representative who has indicated his/her understanding and acceptance.     Dental advisory given  Plan Discussed with: CRNA and Surgeon  Anesthesia Plan Comments:         Anesthesia Quick Evaluation

## 2020-10-14 NOTE — H&P (Signed)
$'@LOGO'X$ @   Primary Care Physician:  Celene Squibb, MD Primary Gastroenterologist:  Dr. Gala Romney  Pre-Procedure History & Physical: HPI:  Sheri Pope is a 80 y.o. female here for further evaluation of rectal bleeding/Hemoccult positive stool.  Also, reports intermittent esophageal dysphagia to solid food and pills.  Here for an EGD and colonoscopy per plan.  Past Medical History:  Diagnosis Date   Arthritis    RA AND OA --PAIN AND SWELLIN IN LEFT KNEE   Blood in urine    NEGATIVE UROLOGY WORK UP --BUT ALWAYS HAS BLOOD IN URINE   GERD (gastroesophageal reflux disease)    H/O hiatal hernia    Hyperlipidemia    Leukemia (HCC)    PONV (postoperative nausea and vomiting)    ALWAYS SICK AFTER SURGERIES EXCEPT AFTER HIP REPLACMENT 2007   Vitamin D deficiency     Past Surgical History:  Procedure Laterality Date   ABDOMINAL HYSTERECTOMY  1870'S   BREAST SURGERY     BREAST BIOSPIES   CATARACT EXTRACTION W/PHACO Left 05/30/2012   Procedure: CATARACT EXTRACTION PHACO AND INTRAOCULAR LENS PLACEMENT (Diagonal);  Surgeon: Tonny Branch, MD;  Location: [facility];  Service: Ophthalmology;  Laterality: Left;  CDE: 16.99   CATARACT EXTRACTION W/PHACO Right 06/24/2012   Procedure: CATARACT EXTRACTION PHACO AND INTRAOCULAR LENS PLACEMENT (IOC);  Surgeon: Tonny Branch, MD;  Location: [facility];  Service: Ophthalmology;  Laterality: Right;  CDE:17.92   HIP SURGERY     JOINT REPLACEMENT  2007   RIGHT HIP REPLACEMENT   LEFT KNEE ARTHROSCOPY  AUG 2012   MULTIPLE ORTHOPEDIC SURGERIES     ON BOTH HANDS AND BOTH FEET, RIGHT ELBOW   SPLEENECTOMY  IN THE 70'S   FOR LARGE CYST   TOTAL KNEE ARTHROPLASTY  11/10/2011   Procedure: TOTAL KNEE ARTHROPLASTY;  Surgeon: Mcarthur Rossetti, MD;  Location: [facility];  Service: Orthopedics;  Laterality: Left;  Left Total Knee Arthroplasty   YAG LASER APPLICATION Bilateral    Dr. Geoffry Paradise    Prior to Admission medications   Medication Sig Start Date End Date Taking? Authorizing Provider   ALPRAZolam (XANAX) 0.25 MG tablet Take 0.125 mg by mouth at bedtime. 06/14/20  Yes [provider]  diclofenac sodium (VOLTAREN) 1 % GEL Apply 2 g topically 4 (four) times daily. Patient taking differently: Apply 2 g topically daily as needed (pain). 12/19/17  Yes Mcarthur Rossetti, MD  HYDROcodone-acetaminophen (NORCO/VICODIN) 5-325 MG tablet Take 0.5 tablets by mouth 2 (two) times daily. 08/03/20  Yes [provider]  lactose free nutrition (BOOST) LIQD Take 237 mLs by mouth daily.   Yes [provider]  methocarbamol (ROBAXIN) 500 MG tablet Take 500 mg by mouth 2 (two) times daily. 07/28/20  Yes [provider]  Polyvinyl Alcohol-Povidone (REFRESH OP) Place 1 drop into both eyes 4 (four) times daily as needed (dry eyes).   Yes [provider]  trolamine salicylate (ASPERCREME) 10 % cream Apply 1 application topically as needed for muscle pain.   Yes [provider]  hydrocortisone (ANUSOL-HC) 25 MG suppository Place 1 suppository (25 mg total) rectally 2 (two) times daily. Patient not taking: Reported on 10/06/2020 08/22/20   Barton Dubois, MD  pantoprazole (PROTONIX) 40 MG tablet Take 1 tablet (40 mg total) by mouth daily. Patient not taking: Reported on 10/06/2020 08/22/20 08/22/21  Barton Dubois, MD    Allergies as of 08/26/2020 - Review Complete 08/26/2020  Allergen Reaction Noted   Codeine Nausea And Vomiting 10/26/2011  Prednisone Other (See Comments) 10/26/2011   Sulfa antibiotics Other (See Comments) 10/26/2011   Aleve [naproxen] Anxiety 10/21/2019   Latex Rash 10/26/2011    Family History  Problem Relation Age of Onset   Diabetes Sister    Macular degeneration Sister    Heart attack Mother    Stroke Father    Dementia Father    Cancer Brother    Healthy Sister    Healthy Sister    Diabetes Brother     Social History   Socioeconomic History   Marital status: Widowed    Spouse name: Not on file   Number of  children: Not on file   Years of education: Not on file   Highest education level: Not on file  Occupational History   Occupation: retired  Tobacco Use   Smoking status: Every Day    Packs/day: 1.00    Years: 50.00    Pack years: 50.00    Types: Cigarettes   Smokeless tobacco: Never  Vaping Use   Vaping Use: Never used  Substance and Sexual Activity   Alcohol use: No   Drug use: No   Sexual activity: Not Currently    Birth control/protection: Surgical  Other Topics Concern   Not on file  Social History Narrative   Not on file   Social Determinants of Health   Financial Resource Strain: Not on file  Food Insecurity: Not on file  Transportation Needs: Not on file  Physical Activity: Not on file  Stress: Not on file  Social Connections: Not on file  Intimate Partner Violence: Not on file    Review of Systems: See HPI, otherwise negative ROS  Physical Exam: BP (!) 161/65   Pulse 90   Temp 98.1 F (36.7 C) (Oral)   Resp 18   SpO2 100%  General:   Alert,  Well-developed, well-nourished, pleasant and cooperative in NAD SMouth:  No deformity or lesions. Neck:  Supple; no masses or thyromegaly. No significant cervical adenopathy. Lungs:  Clear throughout to auscultation.   No wheezes, crackles, or rhonchi. No acute distress. Heart:  Regular rate and rhythm; no murmurs, clicks, rubs,  or gallops. Abdomen: Non-distended, normal bowel sounds.  Soft and nontender without appreciable mass or hepatosplenomegaly.  Pulses:  Normal pulses noted. Extremities:  Without clubbing or edema.  Impression/Plan: 80 year old lady with intermittent esophageal dysphagia.  Recent hospital admission for rectal bleeding.  Outpatient EGD with possible esophageal dilation and colonoscopy per plan. The risks, benefits, limitations, imponderables and alternatives regarding both EGD and colonoscopy have been reviewed with the patient. Questions have been answered. All parties agreeable.       Notice: This dictation was prepared with Dragon dictation along with smaller phrase technology. Any transcriptional errors that result from this process are unintentional and may not be corrected upon review.

## 2020-10-14 NOTE — Anesthesia Postprocedure Evaluation (Signed)
Anesthesia Post Note  Patient: Sheri Pope  Procedure(s) Performed: COLONOSCOPY WITH PROPOFOL ESOPHAGOGASTRODUODENOSCOPY (EGD) WITH PROPOFOL [place]  Patient location during evaluation: Phase II Anesthesia Type: General Level of consciousness: awake and alert and oriented Pain management: pain level controlled Vital Signs Assessment: post-procedure vital signs reviewed and stable Respiratory status: spontaneous breathing and respiratory function stable Cardiovascular status: blood pressure returned to baseline and stable Postop Assessment: no apparent nausea or vomiting Anesthetic complications: no   No notable events documented.   Last Vitals:  Vitals:   10/14/20 1310 10/14/20 1315  BP: (!) 125/55 (!) 131/51  Pulse: 81 76  Resp: 18 18  Temp:    SpO2: 98% 100%    Last Pain:  Vitals:   10/14/20 1307  TempSrc: Oral  PainSc: 0-No pain                 Cher Franzoni C Londen Lorge

## 2020-10-14 NOTE — Op Note (Signed)
[facility] Patient Name: Sheri Pope Procedure Date: 10/14/2020 12:47 PM MRN: [id number] Date of Birth: 1940-04-01 Attending MD: Norvel Richards , MD CSN: [id number] Age: 80 Admit Type: Outpatient Procedure:                Colonoscopy Indications:              Hematochezia Providers:                Norvel Richards, MD, Janeece Riggers, RN, Crystal                            Page, [place] Risa Grill, Technician Referring MD:              Medicines:                Propofol per Anesthesia Complications:            No immediate complications. Estimated Blood Loss:     Estimated blood loss was minimal. Procedure:                Pre-Anesthesia Assessment:                           - Prior to the procedure, a History and Physical                            was performed, and patient medications and                            allergies were reviewed. The patient's tolerance of                            previous anesthesia was also reviewed. The risks                            and benefits of the procedure and the sedation                            options and risks were discussed with the patient.                            All questions were answered, and informed consent                            was obtained. Prior Anticoagulants: The patient has                            taken no previous anticoagulant or antiplatelet                            agents. ASA Grade Assessment: II - A patient with                            mild systemic disease. After reviewing the risks  and benefits, the patient was deemed in                            satisfactory condition to undergo the procedure.                           After obtaining informed consent, the colonoscope                            was passed under direct vision. Throughout the                            procedure, the patient's blood pressure, pulse, and                             oxygen saturations were monitored continuously. The                            [id number]) scope was introduced through the                            anus and advanced to the the cecum, identified by                            appendiceal orifice and ileocecal valve. The                            colonoscopy was performed without difficulty. The                            patient tolerated the procedure well. The quality                            of the bowel preparation was adequate. Scope In: 12:49:59 PM Scope Out: 1:03:41 PM Scope Withdrawal Time: 0 hours 6 minutes 10 seconds  Total Procedure Duration: 0 hours 13 minutes 42 seconds  Findings:      The perianal and digital rectal examinations were normal.      Scattered small and large-mouthed diverticula were found in the sigmoid       colon and descending colon. Some hemorrhagic changes in the colonic       mucosa upon withdrawal most consistent with scope trauma.      Non-bleeding internal hemorrhoids were found during retroflexion. The       hemorrhoids were moderate, large and Grade I (internal hemorrhoids that       do not prolapse). Impression:               - Diverticulosis in the sigmoid colon and in the                            descending colon.                           - Non-bleeding internal hemorrhoids.                           -  No specimens collected. I suspect a significant                            hemorrhoidal disease likely the source of                            low-volume hematochezia seen previously. Moderate Sedation:      Moderate (conscious) sedation was personally administered by an       anesthesia professional. The following parameters were monitored: oxygen       saturation, heart rate, blood pressure, respiratory rate, EKG, adequacy       of pulmonary ventilation, and response to care. Recommendation:           - Patient has a contact number available for                             emergencies. The signs and symptoms of potential                            delayed complications were discussed with the                            patient. Return to normal activities tomorrow.                            Written discharge instructions were provided to the                            patient.                           - Advance diet as tolerated. No future colonoscopy                            unless new symptoms develop. See EGD report. Procedure Code(s):        --- Professional ---                           [phone], Colonoscopy, flexible; diagnostic, including                            collection of specimen(s) by brushing or washing,                            when performed (separate procedure) Diagnosis Code(s):        --- Professional ---                           K64.0, First degree hemorrhoids                           K92.1, Melena (includes Hematochezia)                           K57.30, Diverticulosis of large intestine without  perforation or abscess without bleeding CPT copyright 2019 American Medical Association. All rights reserved. The codes documented in this report are preliminary and upon coder review may  be revised to meet current compliance requirements. Cristopher Estimable. [name], MD Norvel Richards, MD 10/14/2020 1:10:43 PM This report has been signed electronically. Number of Addenda: 0

## 2020-10-14 NOTE — Discharge Instructions (Signed)
Colonoscopy Discharge Instructions  Read the instructions outlined below and refer to this sheet in the next few weeks. These discharge instructions provide you with general information on caring for yourself after you leave the hospital. Your doctor may also give you specific instructions. While your treatment has been planned according to the most current medical practices available, unavoidable complications occasionally occur. If you have any problems or questions after discharge, call Dr. Gala Romney at [phone]. ACTIVITY You may resume your regular activity, but move at a slower pace for the next 24 hours.  Take frequent rest periods for the next 24 hours.  Walking will help get rid of the air and reduce the bloated feeling in your belly (abdomen).  No driving for 24 hours (because of the medicine (anesthesia) used during the test).   Do not sign any important legal documents or operate any machinery for 24 hours (because of the anesthesia used during the test).  NUTRITION Drink plenty of fluids.  You may resume your normal diet as instructed by your doctor.  Begin with a light meal and progress to your normal diet. Heavy or fried foods are harder to digest and may make you feel sick to your stomach (nauseated).  Avoid alcoholic beverages for 24 hours or as instructed.  MEDICATIONS You may resume your normal medications unless your doctor tells you otherwise.  WHAT YOU CAN EXPECT TODAY Some feelings of bloating in the abdomen.  Passage of more gas than usual.  Spotting of blood in your stool or on the toilet paper.  IF YOU HAD POLYPS REMOVED DURING THE COLONOSCOPY: No aspirin products for 7 days or as instructed.  No alcohol for 7 days or as instructed.  Eat a soft diet for the next 24 hours.  FINDING OUT THE RESULTS OF YOUR TEST Not all test results are available during your visit. If your test results are not back during the visit, make an appointment with your caregiver to find out the  results. Do not assume everything is normal if you have not heard from your caregiver or the medical facility. It is important for you to follow up on all of your test results.  SEEK IMMEDIATE MEDICAL ATTENTION IF: You have more than a spotting of blood in your stool.  Your belly is swollen (abdominal distention).  You are nauseated or vomiting.  You have a temperature over 101.  You have abdominal pain or discomfort that is severe or gets worse throughout the day.   EGD Discharge instructions Please read the instructions outlined below and refer to this sheet in the next few weeks. These discharge instructions provide you with general information on caring for yourself after you leave the hospital. Your doctor may also give you specific instructions. While your treatment has been planned according to the most current medical practices available, unavoidable complications occasionally occur. If you have any problems or questions after discharge, please call your doctor. ACTIVITY You may resume your regular activity but move at a slower pace for the next 24 hours.  Take frequent rest periods for the next 24 hours.  Walking will help expel (get rid of) the air and reduce the bloated feeling in your abdomen.  No driving for 24 hours (because of the anesthesia (medicine) used during the test).  You may shower.  Do not sign any important legal documents or operate any machinery for 24 hours (because of the anesthesia used during the test).  NUTRITION Drink plenty of fluids.  You may  resume your normal diet.  Begin with a light meal and progress to your normal diet.  Avoid alcoholic beverages for 24 hours or as instructed by your caregiver.  MEDICATIONS You may resume your normal medications unless your caregiver tells you otherwise.  WHAT YOU CAN EXPECT TODAY You may experience abdominal discomfort such as a feeling of fullness or "gas" pains.  FOLLOW-UP Your doctor will discuss the results of  your test with you.  SEEK IMMEDIATE MEDICAL ATTENTION IF ANY OF THE FOLLOWING OCCUR: Excessive nausea (feeling sick to your stomach) and/or vomiting.  Severe abdominal pain and distention (swelling).  Trouble swallowing.  Temperature over 101 F (37.8 C).  Rectal bleeding or vomiting of blood.   Your esophagus was stretched today  You found to have hemorrhoids and diverticulosis in your colon.  No polyps or tumor.  Information on hemorrhoids and diverticulosis provided  A future colonoscopy is not recommended unless new symptoms develop  Office visit with [place] in 4 to 5 months  At patient request, called Bevely Palmer at [phone] discussed results

## 2020-10-25 ENCOUNTER — Encounter (HOSPITAL_COMMUNITY): Payer: Self-pay | Admitting: Internal Medicine

## 2020-10-27 DIAGNOSIS — R350 Frequency of micturition: Secondary | ICD-10-CM | POA: Diagnosis not present

## 2020-10-27 DIAGNOSIS — K219 Gastro-esophageal reflux disease without esophagitis: Secondary | ICD-10-CM | POA: Diagnosis not present

## 2020-11-10 DIAGNOSIS — E559 Vitamin D deficiency, unspecified: Secondary | ICD-10-CM | POA: Diagnosis not present

## 2020-11-10 DIAGNOSIS — E785 Hyperlipidemia, unspecified: Secondary | ICD-10-CM | POA: Diagnosis not present

## 2020-11-10 DIAGNOSIS — R634 Abnormal weight loss: Secondary | ICD-10-CM | POA: Diagnosis not present

## 2020-11-17 ENCOUNTER — Other Ambulatory Visit: Payer: Self-pay

## 2020-11-17 ENCOUNTER — Inpatient Hospital Stay (HOSPITAL_COMMUNITY): Payer: Medicare HMO | Attending: Hematology

## 2020-11-17 DIAGNOSIS — M069 Rheumatoid arthritis, unspecified: Secondary | ICD-10-CM | POA: Insufficient documentation

## 2020-11-17 DIAGNOSIS — F17210 Nicotine dependence, cigarettes, uncomplicated: Secondary | ICD-10-CM | POA: Diagnosis not present

## 2020-11-17 DIAGNOSIS — K219 Gastro-esophageal reflux disease without esophagitis: Secondary | ICD-10-CM | POA: Diagnosis not present

## 2020-11-17 DIAGNOSIS — E559 Vitamin D deficiency, unspecified: Secondary | ICD-10-CM | POA: Diagnosis not present

## 2020-11-17 DIAGNOSIS — D72820 Lymphocytosis (symptomatic): Secondary | ICD-10-CM | POA: Diagnosis not present

## 2020-11-17 DIAGNOSIS — R5383 Other fatigue: Secondary | ICD-10-CM | POA: Insufficient documentation

## 2020-11-17 DIAGNOSIS — R634 Abnormal weight loss: Secondary | ICD-10-CM | POA: Insufficient documentation

## 2020-11-17 DIAGNOSIS — E785 Hyperlipidemia, unspecified: Secondary | ICD-10-CM | POA: Insufficient documentation

## 2020-11-17 DIAGNOSIS — R630 Anorexia: Secondary | ICD-10-CM | POA: Insufficient documentation

## 2020-11-17 DIAGNOSIS — Z79899 Other long term (current) drug therapy: Secondary | ICD-10-CM | POA: Insufficient documentation

## 2020-11-17 DIAGNOSIS — D709 Neutropenia, unspecified: Secondary | ICD-10-CM | POA: Insufficient documentation

## 2020-11-17 LAB — CBC WITH DIFFERENTIAL/PLATELET
Abs Immature Granulocytes: 0.01 10*3/uL (ref 0.00–0.07)
Basophils Absolute: 0.2 10*3/uL — ABNORMAL HIGH (ref 0.0–0.1)
Basophils Relative: 1 %
Eosinophils Absolute: 0 10*3/uL (ref 0.0–0.5)
Eosinophils Relative: 0 %
HCT: 39.4 % (ref 36.0–46.0)
Hemoglobin: 13.5 g/dL (ref 12.0–15.0)
Immature Granulocytes: 0 %
Lymphocytes Relative: 96 %
Lymphs Abs: 13.4 10*3/uL — ABNORMAL HIGH (ref 0.7–4.0)
MCH: 32.9 pg (ref 26.0–34.0)
MCHC: 34.3 g/dL (ref 30.0–36.0)
MCV: 96.1 fL (ref 80.0–100.0)
Monocytes Absolute: 0.3 10*3/uL (ref 0.1–1.0)
Monocytes Relative: 2 %
Neutro Abs: 0.1 10*3/uL — CL (ref 1.7–7.7)
Neutrophils Relative %: 1 %
Platelets: 295 10*3/uL (ref 150–400)
RBC: 4.1 MIL/uL (ref 3.87–5.11)
RDW: 18.5 % — ABNORMAL HIGH (ref 11.5–15.5)
WBC: 13.9 10*3/uL — ABNORMAL HIGH (ref 4.0–10.5)
nRBC: 0 % (ref 0.0–0.2)

## 2020-11-17 LAB — LACTATE DEHYDROGENASE: LDH: 170 U/L (ref 98–192)

## 2020-11-23 NOTE — Progress Notes (Signed)
[place], [place] [address]   CLINIC:  Medical Oncology/Hematology  PCP:  Celene Squibb, MD [address] [place] [address] [phone]   REASON FOR VISIT:  Follow-up for lymphocytosis and neutropenia  PRIOR THERAPY: Splenectomy in 1970's  CURRENT THERAPY: surveillance  INTERVAL HISTORY:  Sheri Pope, a 80 y.o. female, returns for routine follow-up of her lymphocytosis and neutropenia. Sheri Pope was last seen on 08/11/2020.   Today Sheri Pope reports feeling okay. Sheri Pope reports continued unintentional weight loss over the past year. Sheri Pope denies any recent infections. Sheri Pope reports waxing and waning poor appetite. Sheri Pope reports generalized chronic pain due arthritis for which Sheri Pope take hydrocodone and methocarbamol as needed. Sheri Pope reports a knot on her spine. Sheri Pope reports episodes of shivering that can last up to 2 hours and at minimum 30 minutes for up to 3 days a weeks, and this started several months ago. Sheri Pope denies associated sweating and fever.  REVIEW OF SYSTEMS:  Review of Systems  Constitutional:  Positive for appetite change, fatigue and unexpected weight change. Negative for fever.  HENT:   Positive for lump/mass (lump on spine).        Choking feeling  Musculoskeletal:  Positive for arthralgias (8/10 chronic).  All other systems reviewed and are negative.  PAST MEDICAL/SURGICAL HISTORY:  Past Medical History:  Diagnosis Date   Arthritis    RA AND OA --PAIN AND SWELLIN IN LEFT KNEE   Blood in urine    NEGATIVE UROLOGY WORK UP --BUT ALWAYS HAS BLOOD IN URINE   GERD (gastroesophageal reflux disease)    H/O hiatal hernia    Hyperlipidemia    Leukemia (HCC)    PONV (postoperative nausea and vomiting)    ALWAYS SICK AFTER SURGERIES EXCEPT AFTER HIP REPLACMENT 2007   Vitamin D deficiency    Past Surgical History:  Procedure Laterality Date   ABDOMINAL HYSTERECTOMY  1870'S   BREAST SURGERY     BREAST BIOSPIES   CATARACT  EXTRACTION W/PHACO Left 05/30/2012   Procedure: CATARACT EXTRACTION PHACO AND INTRAOCULAR LENS PLACEMENT ([place]);  Surgeon: Tonny Branch, MD;  Location: [facility];  Service: Ophthalmology;  Laterality: Left;  CDE: 16.99   CATARACT EXTRACTION W/PHACO Right 06/24/2012   Procedure: CATARACT EXTRACTION PHACO AND INTRAOCULAR LENS PLACEMENT ([facility]);  Surgeon: Tonny Branch, MD;  Location: [facility];  Service: Ophthalmology;  Laterality: Right;  CDE:17.92   COLONOSCOPY WITH PROPOFOL N/A 10/14/2020   Procedure: COLONOSCOPY WITH PROPOFOL;  Surgeon: Daneil Dolin, MD;  Location: [facility];  Service: Endoscopy;  Laterality: N/A;  ASA III / 12:30   ESOPHAGOGASTRODUODENOSCOPY (EGD) WITH PROPOFOL N/A 10/14/2020   Procedure: ESOPHAGOGASTRODUODENOSCOPY (EGD) WITH PROPOFOL;  Surgeon: Daneil Dolin, MD;  Location: [facility];  Service: Endoscopy;  Laterality: N/A;   HIP SURGERY     JOINT REPLACEMENT  2007   RIGHT HIP REPLACEMENT   LEFT KNEE ARTHROSCOPY  AUG 2012   MALONEY DILATION  10/14/2020   Procedure: MALONEY DILATION;  Surgeon: Daneil Dolin, MD;  Location: [facility];  Service: Endoscopy;;   MULTIPLE ORTHOPEDIC SURGERIES     ON BOTH HANDS AND BOTH FEET, RIGHT ELBOW   SPLEENECTOMY  IN THE 70'S   FOR LARGE CYST   TOTAL KNEE ARTHROPLASTY  11/10/2011   Procedure: TOTAL KNEE ARTHROPLASTY;  Surgeon: Mcarthur Rossetti, MD;  Location: [facility];  Service: Orthopedics;  Laterality: Left;  Left Total Knee Arthroplasty   YAG  LASER APPLICATION Bilateral    Dr. Geoffry Paradise    SOCIAL HISTORY:  Social History   Socioeconomic History   Marital status: Widowed    Spouse name: Not on file   Number of children: Not on file   Years of education: Not on file   Highest education level: Not on file  Occupational History   Occupation: retired  Tobacco Use   Smoking status: Every Day    Packs/day: 1.00    Years: 50.00    Pack years: 50.00    Types: Cigarettes   Smokeless tobacco: Never  Vaping Use   Vaping Use: Never  used  Substance and Sexual Activity   Alcohol use: No   Drug use: No   Sexual activity: Not Currently    Birth control/protection: Surgical  Other Topics Concern   Not on file  Social History Narrative   Not on file   Social Determinants of Health   Financial Resource Strain: Not on file  Food Insecurity: Not on file  Transportation Needs: Not on file  Physical Activity: Not on file  Stress: Not on file  Social Connections: Not on file  Intimate Partner Violence: Not on file    FAMILY HISTORY:  Family History  Problem Relation Age of Onset   Diabetes Sister    Macular degeneration Sister    Heart attack Mother    Stroke Father    Dementia Father    Cancer Brother    Healthy Sister    Healthy Sister    Diabetes Brother     CURRENT MEDICATIONS:  Current Outpatient Medications  Medication Sig Dispense Refill   ALPRAZolam (XANAX) 0.25 MG tablet Take 0.125 mg by mouth at bedtime.     diclofenac sodium (VOLTAREN) 1 % GEL Apply 2 g topically 4 (four) times daily. (Patient taking differently: Apply 2 g topically daily as needed (pain).) 100 g 3   HYDROcodone-acetaminophen (NORCO/VICODIN) 5-325 MG tablet Take 0.5 tablets by mouth 2 (two) times daily.     hydrocortisone (ANUSOL-HC) 25 MG suppository Place 1 suppository (25 mg total) rectally 2 (two) times daily. (Patient not taking: Reported on 10/06/2020) 12 suppository 0   lactose free nutrition (BOOST) LIQD Take 237 mLs by mouth daily.     methocarbamol (ROBAXIN) 500 MG tablet Take 500 mg by mouth 2 (two) times daily.     pantoprazole (PROTONIX) 40 MG tablet Take 1 tablet (40 mg total) by mouth daily. (Patient not taking: Reported on 10/06/2020) 30 tablet 1   Polyvinyl Alcohol-Povidone (REFRESH OP) Place 1 drop into both eyes 4 (four) times daily as needed (dry eyes).     trolamine salicylate (ASPERCREME) 10 % cream Apply 1 application topically as needed for muscle pain.     No current facility-administered medications for  this visit.    ALLERGIES:  Allergies  Allergen Reactions   Codeine Nausea And Vomiting   Prednisone Other (See Comments)    Thought Sheri Pope was having a heart attack   Sulfa Antibiotics Other (See Comments)    unknown   Aleve [Naproxen] Anxiety    Jittery and hot flashes   Latex Rash    PHYSICAL EXAM:  Performance status (ECOG): 1 - Symptomatic but completely ambulatory  There were no vitals filed for this visit. Wt Readings from Last 3 Encounters:  10/12/20 96 lb (43.5 kg)  08/26/20 98 lb 3.2 oz (44.5 kg)  08/25/20 97 lb 12.8 oz (44.4 kg)   Physical Exam Vitals reviewed.  Constitutional:  Appearance: Normal appearance.  Cardiovascular:     Rate and Rhythm: Normal rate and regular rhythm.     Pulses: Normal pulses.     Heart sounds: Normal heart sounds.  Pulmonary:     Effort: Pulmonary effort is normal.     Breath sounds: Normal breath sounds.  Musculoskeletal:     Comments: Lipoma present  Lymphadenopathy:     Upper Body:     Right upper body: No axillary or pectoral adenopathy.     Left upper body: Axillary adenopathy (sub cm lypmh nodes) present. No pectoral adenopathy.  Neurological:     General: No focal deficit present.     Mental Status: Sheri Pope is alert and oriented to person, place, and time.  Psychiatric:        Mood and Affect: Mood normal.        Behavior: Behavior normal.     LABORATORY DATA:  I have reviewed the labs as listed.  CBC Latest Ref Rng & Units 11/17/2020 10/12/2020 08/22/2020  WBC 4.0 - 10.5 K/uL 13.9(H) 14.2(H) 10.8(H)  Hemoglobin 12.0 - 15.0 g/dL 13.5 13.4 12.0  Hematocrit 36.0 - 46.0 % 39.4 39.1 35.7(L)  Platelets 150 - 400 K/uL 295 329 271   CMP Latest Ref Rng & Units 08/22/2020 08/21/2020 11/06/2019  Glucose 70 - 99 mg/dL 91 93 -  BUN 8 - 23 mg/dL 10 13 -  Creatinine 0.44 - 1.00 mg/dL 0.57 0.55 0.60  Sodium 135 - 145 mmol/L 132(L) 129(L) -  Potassium 3.5 - 5.1 mmol/L 3.5 3.9 -  Chloride 98 - 111 mmol/L 101 98 -  CO2 22 - 32 mmol/L  27 25 -  Calcium 8.9 - 10.3 mg/dL 8.5(L) 9.0 -  Total Protein 6.5 - 8.1 g/dL - 7.8 -  Total Bilirubin 0.3 - 1.2 mg/dL - 0.4 -  Alkaline Phos 38 - 126 U/L - 92 -  AST 15 - 41 U/L - 17 -  ALT 0 - 44 U/L - 17 -    DIAGNOSTIC IMAGING:  I have independently reviewed the scans and discussed with the patient. No results found.   ASSESSMENT:  1.  Lymphocytic leukocytosis: -CBC on 10-13-2019 shows white count 13.5, with differential showing 2% neutrophils, 94% lymphocytes, 3% monocytes and 1% basophils. -Hemoglobin and platelet count were normal. -Sheri Pope had history of splenectomy at age 62 secondary to a cyst on it. -Sheri Pope reported 5 to 10 pound weight loss in the last 6 months but denies any fevers or night sweats. -Reports decreased appetite and decreased energy levels for the last 5 to 6 weeks. -CT CAP on 11/06/2019 showed a borderline prominent right axillary lymph node, 1.4 cm right upper normal-sized left axillary and right subpectoral lymph nodes with no overtly pathologic adenopathy.  Mild chronic diffuse prominence of thyroid gland without discrete nodule. - T-cell receptor gene rearrangement showed clonal T-cell gamma population as well as clonal T-cell beta population detected.  This usually reflects presence of T-cell lymphocytic neoplasm.   2.  Social/family history: -Sheri Pope lives at home with her nephew.  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 -Sheri Pope is a current active smoker, 1 pack/day for 50+ years. -Brother had cancer, type unknown.   3.  Rheumatoid arthritis: -Sheri Pope has rheumatoid arthritis of the extremities, predominantly in the upper extremities with rheumatoid nodules. -Uses Voltaren and aspirin cream.   PLAN:  1.  Lymphocytic leukocytosis: - Bone marrow biopsy on 08/11/2020 shows normocellular bone marrow for age, moderate involvement by interstitial T-cell infiltrates.  This favors large granular lymphocytic leukemia. 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  This can be detected in 30  to 40% cases of T-cell LGL leukemia.  Polytypic plasma cells about 10% were seen.  Chromosome analysis was 33, XX. - Sheri Pope does not have any B symptoms at this time.  No infections. - Reviewed labs from 11/17/2020.  LDH was normal.  White count is 13.9 with 96% lymphocytes and 1% neutrophils.  Sheri Pope does have severe neutropenia but does not have any recurrent infections.  Platelet count was normal. - We will continue to monitor at this time.  No indication for G-CSF. - RTC 3 months with labs. - Sheri Pope also reported shivering episodes lasting from 30 minutes to 2 hours over the last several months on and off.  Sheri Pope does not report any associated sweating or fevers.  Sheri Pope had about 3 episodes last week, but otherwise they are not so frequent.   2.  Weight loss: - Her weight has been stable lately.  Last CT CAP in September 2021 did not show any clear etiology for weight loss.  Sheri Pope does not want to start on Marinol.  3.  Rheumatoid arthritis: - Sheri Pope has rheumatoid arthritis involving upper and lower extremities.  Sheri Pope is not on any acute treatments.   Orders placed this encounter:  No orders of the defined types were placed in this encounter.    Derek Jack, MD [place] [phone]   I, Thana Ates, am acting as a scribe for Dr. Derek Jack.  I, Derek Jack MD, have reviewed the above documentation for accuracy and completeness, and I agree with the above.

## 2020-11-24 ENCOUNTER — Encounter (HOSPITAL_COMMUNITY): Payer: Self-pay | Admitting: Hematology

## 2020-11-24 ENCOUNTER — Other Ambulatory Visit: Payer: Self-pay

## 2020-11-24 ENCOUNTER — Inpatient Hospital Stay (HOSPITAL_BASED_OUTPATIENT_CLINIC_OR_DEPARTMENT_OTHER): Payer: Medicare HMO | Admitting: Hematology

## 2020-11-24 VITALS — BP 126/55 | HR 91 | Temp 97.7°F | Resp 22 | Wt 95.3 lb

## 2020-11-24 DIAGNOSIS — D709 Neutropenia, unspecified: Secondary | ICD-10-CM | POA: Diagnosis not present

## 2020-11-24 DIAGNOSIS — M069 Rheumatoid arthritis, unspecified: Secondary | ICD-10-CM | POA: Diagnosis not present

## 2020-11-24 DIAGNOSIS — R634 Abnormal weight loss: Secondary | ICD-10-CM | POA: Diagnosis not present

## 2020-11-24 DIAGNOSIS — R630 Anorexia: Secondary | ICD-10-CM | POA: Diagnosis not present

## 2020-11-24 DIAGNOSIS — E785 Hyperlipidemia, unspecified: Secondary | ICD-10-CM | POA: Diagnosis not present

## 2020-11-24 DIAGNOSIS — D72820 Lymphocytosis (symptomatic): Secondary | ICD-10-CM | POA: Diagnosis not present

## 2020-11-24 DIAGNOSIS — R5383 Other fatigue: Secondary | ICD-10-CM | POA: Diagnosis not present

## 2020-11-24 DIAGNOSIS — K219 Gastro-esophageal reflux disease without esophagitis: Secondary | ICD-10-CM | POA: Diagnosis not present

## 2020-11-24 NOTE — Patient Instructions (Addendum)
[facility] Discharge Instructions  You were seen today by Dr. Katragadda. He went over your recent results. Dr. Katragadda will see you back in 3 months for labs and follow up.   Thank you for choosing [facility] at [facility] to provide your oncology and hematology care.  To afford each patient quality time with our provider, please arrive at least 15 minutes before your scheduled appointment time.   If you have a lab appointment with the Cancer Center please come in thru the Main Entrance and check in at the main information desk  You need to re-schedule your appointment should you arrive 10 or more minutes late.  We strive to give you quality time with our providers, and arriving late affects you and other patients whose appointments are after yours.  Also, if you no show three or more times for appointments you may be dismissed from the clinic at the providers discretion.     Again, thank you for choosing [facility].  Our hope is that these requests will decrease the amount of time that you wait before being seen by our physicians.       _____________________________________________________________  Should you have questions after your visit to [facility], please contact our office at [phone] between the hours of 8:00 a.m. and 4:30 p.m.  Voicemails left after 4:00 p.m. will not be returned until the following business day.  For prescription refill requests, have your pharmacy contact our office and allow 72 hours.    Cancer Center Support Programs:   > Cancer Support Group  2nd Tuesday of the month 1pm-2pm, Journey Room   

## 2020-12-01 DIAGNOSIS — D72819 Decreased white blood cell count, unspecified: Secondary | ICD-10-CM | POA: Diagnosis not present

## 2020-12-01 DIAGNOSIS — E559 Vitamin D deficiency, unspecified: Secondary | ICD-10-CM | POA: Diagnosis not present

## 2020-12-01 DIAGNOSIS — M2550 Pain in unspecified joint: Secondary | ICD-10-CM | POA: Diagnosis not present

## 2020-12-01 DIAGNOSIS — E871 Hypo-osmolality and hyponatremia: Secondary | ICD-10-CM | POA: Diagnosis not present

## 2020-12-01 DIAGNOSIS — Z0001 Encounter for general adult medical examination with abnormal findings: Secondary | ICD-10-CM | POA: Diagnosis not present

## 2020-12-01 DIAGNOSIS — E639 Nutritional deficiency, unspecified: Secondary | ICD-10-CM | POA: Diagnosis not present

## 2020-12-01 DIAGNOSIS — E785 Hyperlipidemia, unspecified: Secondary | ICD-10-CM | POA: Diagnosis not present

## 2020-12-01 DIAGNOSIS — F329 Major depressive disorder, single episode, unspecified: Secondary | ICD-10-CM | POA: Diagnosis not present

## 2020-12-13 ENCOUNTER — Encounter: Payer: Self-pay | Admitting: Physician Assistant

## 2020-12-13 ENCOUNTER — Ambulatory Visit: Payer: Self-pay

## 2020-12-13 ENCOUNTER — Other Ambulatory Visit: Payer: Self-pay

## 2020-12-13 ENCOUNTER — Ambulatory Visit: Payer: Medicare HMO | Admitting: Physician Assistant

## 2020-12-13 DIAGNOSIS — M25562 Pain in left knee: Secondary | ICD-10-CM

## 2020-12-13 DIAGNOSIS — M25551 Pain in right hip: Secondary | ICD-10-CM | POA: Diagnosis not present

## 2020-12-13 NOTE — Addendum Note (Signed)
Addended by: Robyne Peers on: 12/13/2020 01:27 PM   Modules accepted: Orders

## 2020-12-13 NOTE — Progress Notes (Signed)
Office Visit Note   Patient: Sheri Pope           Date of Birth: 01-Nov-1940           MRN: [id number] Visit Date: 12/13/2020              Requested by: Celene Squibb, MD [address] [place],  [place] [address] PCP: Celene Squibb, MD   Assessment & Plan: Visit Diagnoses:  1. Acute pain of left knee   2. Pain in left hip     Plan: Given the new mass right posterior hip region and the fact that is causing her severe pain recommend MRI to evaluate this this.  An MRI with contrast of the right hip.  Have her follow-up after the MRI to go over results and discuss further treatment.  Follow-Up Instructions: Return After MRI.   Orders:  Orders Placed This Encounter  Procedures   XR HIP UNILAT W OR W/O PELVIS 2-3 VIEWS RIGHT   XR Knee 1-2 Views Left   No orders of the defined types were placed in this encounter.     Procedures: No procedures performed   Clinical Data: No additional findings.   Subjective: Chief Complaint  Patient presents with   Right Hip - Pain   Left Knee - Pain    HPI Sheri Pope comes in today with the right hip and left knee pain.  She states she has had pain in the right hip for the past 2 to 3 weeks.  Notes that she has a new knot on the posterior aspect of her hip.  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  However this is new mass is causing her considerable pain.  She does have rheumatoid arthritis but is benign able to tolerate medications to treat her rheumatoid arthritis in the past.  She has had no injury to the hip.  She is also having left knee pain for the past 2 to 3 weeks.  History of left knee replacement 2013.  Also history of right total hip replacement in the past.  Negative for fevers or chills. Review of Systems Please see HPI otherwise negative or noncontributory.  Objective: Vital Signs: There were no vitals taken for this visit.  Physical Exam General well-developed  well-nourished female in no acute distress.  Psych alert and oriented x3. Ortho Exam Patient has multiple nodules throughout her hands have her wrist and lower legs. Bilateral hips good range of motion of both hips without pain.  She has tenderness over the left trochanteric region.  There is a palpable nodule at the posterior proximal thigh gluteal fold region.  This area is red and has a scaly appearance which is different from the other nodules she has throughout her upper and lower extremities.  Left knee good range of motion.  No gross instability valgus varus stressing.  Well-healed surgical incision.  Palpable mobile nodule over the area of the IT band insertion.  No abnormal warmth or erythema over this area.  No effusion abnormal warmth erythema of the left knee. Specialty Comments:  No specialty comments available.  Imaging: XR HIP UNILAT W OR W/O PELVIS 2-3 VIEWS RIGHT  Result Date: 12/13/2020 [facility] pelvis lateral view right hip: No acute fracture.  Bilateral hips well located.  Status post right total hip arthroplasty with well-seated components.  Heterotopic bone off the greater trochanteric region.  XR Knee 1-2 Views Left  Result  Date: 12/13/2020 Left knee 2 views: No acute fracture.  Knee is well located.  Status post left total knee arthroplasty with well-seated components.    PMFS History: Patient Active Problem List   Diagnosis Date Noted   Rectal bleeding 08/21/2020   Rheumatoid arthritis ([place]) 08/21/2020   Leukocytosis 10/21/2019   Neutropenia ([place]) 10/21/2019   Trochanteric bursitis, left hip 12/19/2017   Degenerative arthritis of left knee 11/10/2011   Past Medical History:  Diagnosis Date   Arthritis    RA AND OA --PAIN AND SWELLIN IN LEFT KNEE   Blood in urine    NEGATIVE UROLOGY WORK UP --BUT ALWAYS HAS BLOOD IN URINE   GERD (gastroesophageal reflux disease)    H/O hiatal hernia    Hyperlipidemia    Leukemia ([facility])    PONV (postoperative nausea and  vomiting)    ALWAYS SICK AFTER SURGERIES EXCEPT AFTER HIP REPLACMENT 2007   Vitamin D deficiency     Family History  Problem Relation Age of Onset   Diabetes Sister    Macular degeneration Sister    Heart attack Mother    Stroke Father    Dementia Father    Cancer Brother    Healthy Sister    Healthy Sister    Diabetes Brother     Past Surgical History:  Procedure Laterality Date   ABDOMINAL HYSTERECTOMY  1870'S   BREAST SURGERY     BREAST BIOSPIES   CATARACT EXTRACTION W/PHACO Left 05/30/2012   Procedure: CATARACT EXTRACTION PHACO AND INTRAOCULAR LENS PLACEMENT ([place]);  Surgeon: Tonny Branch, MD;  Location: [facility];  Service: Ophthalmology;  Laterality: Left;  CDE: 16.99   CATARACT EXTRACTION W/PHACO Right 06/24/2012   Procedure: CATARACT EXTRACTION PHACO AND INTRAOCULAR LENS PLACEMENT ([facility]);  Surgeon: Tonny Branch, MD;  Location: [facility];  Service: Ophthalmology;  Laterality: Right;  CDE:17.92   COLONOSCOPY WITH PROPOFOL N/A 10/14/2020   Procedure: COLONOSCOPY WITH PROPOFOL;  Surgeon: Daneil Dolin, MD;  Location: [facility];  Service: Endoscopy;  Laterality: N/A;  ASA III / 12:30   ESOPHAGOGASTRODUODENOSCOPY (EGD) WITH PROPOFOL N/A 10/14/2020   Procedure: ESOPHAGOGASTRODUODENOSCOPY (EGD) WITH PROPOFOL;  Surgeon: Daneil Dolin, MD;  [facility];  Service: Endoscopy;  Laterality: N/A;   HIP SURGERY     JOINT REPLACEMENT  2007   RIGHT HIP REPLACEMENT   LEFT KNEE ARTHROSCOPY  AUG 2012   MALONEY DILATION  10/14/2020   Procedure: MALONEY DILATION;  Surgeon: Daneil Dolin, MD;  Location: [facility];  Service: Endoscopy;;   MULTIPLE ORTHOPEDIC SURGERIES     ON BOTH HANDS AND BOTH FEET, RIGHT ELBOW   SPLEENECTOMY  IN THE 70'S   FOR LARGE CYST   TOTAL KNEE ARTHROPLASTY  11/10/2011   Procedure: TOTAL KNEE ARTHROPLASTY;  Surgeon: Mcarthur Rossetti, MD;  Location: [facility];  Service: Orthopedics;  Laterality: Left;  Left Total Knee Arthroplasty   YAG LASER APPLICATION  Bilateral    Dr. Geoffry Paradise   Social History   Occupational History   Occupation: retired  Tobacco Use   Smoking status: Every Day    Packs/day: 1.00    Years: 50.00    Pack years: 50.00    Types: Cigarettes   Smokeless tobacco: Never  Vaping Use   Vaping Use: Never used  Substance and Sexual Activity   Alcohol use: No   Drug use: No   Sexual activity: Not Currently    Birth control/protection: Surgical

## 2020-12-29 ENCOUNTER — Ambulatory Visit (HOSPITAL_COMMUNITY)
Admission: RE | Admit: 2020-12-29 | Discharge: 2020-12-29 | Disposition: A | Discharge status: Home / Self Care | Payer: Medicare HMO | Source: Ambulatory Visit | Attending: Physician Assistant | Admitting: Physician Assistant

## 2020-12-29 ENCOUNTER — Other Ambulatory Visit: Payer: Self-pay

## 2020-12-29 DIAGNOSIS — M1611 Unilateral primary osteoarthritis, right hip: Secondary | ICD-10-CM | POA: Diagnosis not present

## 2020-12-29 DIAGNOSIS — M25551 Pain in right hip: Secondary | ICD-10-CM | POA: Insufficient documentation

## 2020-12-29 DIAGNOSIS — Z96641 Presence of right artificial hip joint: Secondary | ICD-10-CM | POA: Diagnosis not present

## 2020-12-29 DIAGNOSIS — M25451 Effusion, right hip: Secondary | ICD-10-CM | POA: Diagnosis not present

## 2020-12-29 DIAGNOSIS — M7061 Trochanteric bursitis, right hip: Secondary | ICD-10-CM | POA: Diagnosis not present

## 2020-12-29 MED ORDER — GADOBUTROL 1 MMOL/ML IV SOLN
4.3000 mL | Freq: Once | INTRAVENOUS | Status: AC | PRN
  Administered 2020-12-29: 4.3 mL via INTRAVENOUS

## 2021-01-03 ENCOUNTER — Ambulatory Visit: Payer: Medicare HMO | Admitting: Orthopaedic Surgery

## 2021-01-03 ENCOUNTER — Encounter: Payer: Self-pay | Admitting: Orthopaedic Surgery

## 2021-01-03 DIAGNOSIS — M25551 Pain in right hip: Secondary | ICD-10-CM

## 2021-01-03 NOTE — Progress Notes (Signed)
Patient comes for follow-up after having an MRI of her right hip.  She has significant rheumatoid nodules throughout her body.  She has a history of right hip replacement.  There was a new nodule that she had noted around the ischium area that was worrisome for her.  This seems to be consistent with her other nodules but we did send her for MRI to make sure there is no worrisome features.  She is 80 years old.  She has these nodules all over her body.  There is a painful nodule over her ischium on the right side.  It is hypermobile in the soft tissue.  Does not have any worrisome features clinically.  The MRI does not show any worrisome features of this area as well.  Hip replacement is intact as well.  She is not and should any type of intervention she just wanted reassurance that this was not a different nodule than what she deals with on a normal basis.  All questions and concerns were answered addressed.  Follow-up can be as needed.

## 2021-01-21 ENCOUNTER — Encounter: Payer: Self-pay | Admitting: Emergency Medicine

## 2021-01-21 ENCOUNTER — Ambulatory Visit
Admission: EM | Admit: 2021-01-21 | Discharge: 2021-01-21 | Disposition: A | Discharge status: Home / Self Care | Payer: Medicare HMO | Attending: Urgent Care | Admitting: Urgent Care

## 2021-01-21 ENCOUNTER — Other Ambulatory Visit: Payer: Self-pay

## 2021-01-21 DIAGNOSIS — B029 Zoster without complications: Secondary | ICD-10-CM | POA: Diagnosis not present

## 2021-01-21 MED ORDER — PREGABALIN 25 MG PO CAPS: 25.0000 mg | ORAL_CAPSULE | Freq: Two times a day (BID) | ORAL | 0 refills | Status: DC | PRN

## 2021-01-21 MED ORDER — VALACYCLOVIR HCL 1 G PO TABS: 1000.0000 mg | ORAL_TABLET | Freq: Three times a day (TID) | ORAL | 0 refills | Status: DC

## 2021-01-21 NOTE — ED Provider Notes (Signed)
[facility]   MRN: [id number] DOB: Dec 19, 1940  Subjective:   Sheri Pope is a 80 y.o. female presenting for 1 day history of acute onset painful and itchy rash.  Patient has a history of shingles, reports that this feels exactly the same.  Has it over her neck and upper back extending up towards her shoulder.  Has chronic pain, takes hydrocodone and Xanax regularly.  Her current pain is a burning type sensation and would like something for this.  Has not tolerated gabapentin in the past.  No current facility-administered medications for this encounter.  Current Outpatient Medications:    ALPRAZolam (XANAX) 0.25 MG tablet, Take 0.125 mg by mouth at bedtime., Disp: , Rfl:    diclofenac sodium (VOLTAREN) 1 % GEL, Apply 2 g topically 4 (four) times daily. (Patient taking differently: Apply 2 g topically daily as needed (pain).), Disp: 100 g, Rfl: 3   HYDROcodone-acetaminophen (NORCO/VICODIN) 5-325 MG tablet, Take 0.5 tablets by mouth 2 (two) times daily., Disp: , Rfl:    hydrocortisone (ANUSOL-HC) 25 MG suppository, Place 1 suppository (25 mg total) rectally 2 (two) times daily., Disp: 12 suppository, Rfl: 0   lactose free nutrition (BOOST) LIQD, Take 237 mLs by mouth daily., Disp: , Rfl:    methocarbamol (ROBAXIN) 500 MG tablet, Take 500 mg by mouth 2 (two) times daily., Disp: , Rfl:    pantoprazole (PROTONIX) 40 MG tablet, Take 1 tablet (40 mg total) by mouth daily., Disp: 30 tablet, Rfl: 1   Polyvinyl Alcohol-Povidone (REFRESH OP), Place 1 drop into both eyes 4 (four) times daily as needed (dry eyes)., Disp: , Rfl:    trolamine salicylate (ASPERCREME) 10 % cream, Apply 1 application topically as needed for muscle pain., Disp: , Rfl:    Allergies  Allergen Reactions   Codeine Nausea And Vomiting   Prednisone Other (See Comments)    Thought she was having a heart attack   Sulfa Antibiotics Other (See Comments)    unknown   Aleve [Naproxen] Anxiety    Jittery and hot  flashes   Latex Rash    Past Medical History:  Diagnosis Date   Arthritis    RA AND OA --PAIN AND SWELLIN IN LEFT KNEE   Blood in urine    NEGATIVE UROLOGY WORK UP --BUT ALWAYS HAS BLOOD IN URINE   GERD (gastroesophageal reflux disease)    H/O hiatal hernia    Hyperlipidemia    Leukemia (HCC)    PONV (postoperative nausea and vomiting)    ALWAYS SICK AFTER SURGERIES EXCEPT AFTER HIP REPLACMENT 2007   Vitamin D deficiency      Past Surgical History:  Procedure Laterality Date   ABDOMINAL HYSTERECTOMY  1870'S   BREAST SURGERY     BREAST BIOSPIES   CATARACT EXTRACTION W/PHACO Left 05/30/2012   Procedure: CATARACT EXTRACTION PHACO AND INTRAOCULAR LENS PLACEMENT ([facility]);  Surgeon: Tonny Branch, MD;  Location: [facility];  Service: Ophthalmology;  Laterality: Left;  CDE: 16.99   CATARACT EXTRACTION W/PHACO Right 06/24/2012   Procedure: CATARACT EXTRACTION PHACO AND INTRAOCULAR LENS PLACEMENT ([facility]);  Surgeon: Tonny Branch, MD;  Location: [facility];  Service: Ophthalmology;  Laterality: Right;  CDE:17.92   COLONOSCOPY WITH PROPOFOL N/A 10/14/2020   Procedure: COLONOSCOPY WITH PROPOFOL;  Surgeon: Daneil Dolin, MD;  [facility];  Service: Endoscopy;  Laterality: N/A;  ASA III / 12:30   ESOPHAGOGASTRODUODENOSCOPY (EGD) WITH PROPOFOL N/A 10/14/2020   Procedure: ESOPHAGOGASTRODUODENOSCOPY (EGD) WITH PROPOFOL;  Surgeon: Daneil Dolin,  MD;  [facility];  Service: Endoscopy;  Laterality: N/A;   HIP SURGERY     JOINT REPLACEMENT  2007   RIGHT HIP REPLACEMENT   LEFT KNEE ARTHROSCOPY  AUG 2012   MALONEY DILATION  10/14/2020   Procedure: MALONEY DILATION;  Surgeon: Daneil Dolin, MD;  [facility];  Service: Endoscopy;;   MULTIPLE ORTHOPEDIC SURGERIES     ON BOTH HANDS AND BOTH FEET, RIGHT ELBOW   SPLEENECTOMY  IN THE 70'S   FOR LARGE CYST   TOTAL KNEE ARTHROPLASTY  11/10/2011   Procedure: TOTAL KNEE ARTHROPLASTY;  Surgeon: Mcarthur Rossetti, MD;  Location[facility];   Service: Orthopedics;  Laterality: Left;  Left Total Knee Arthroplasty   YAG LASER APPLICATION Bilateral    Dr. Geoffry Paradise    Family History  Problem Relation Age of Onset   Diabetes Sister    Macular degeneration Sister    Heart attack Mother    Stroke Father    Dementia Father    Cancer Brother    Healthy Sister    Healthy Sister    Diabetes Brother     Social History   Tobacco Use   Smoking status: Every Day    Packs/day: 1.00    Years: 50.00    Pack years: 50.00    Types: Cigarettes   Smokeless tobacco: Never  Vaping Use   Vaping Use: Never used  Substance Use Topics   Alcohol use: No   Drug use: No    ROS   Objective:   Vitals: BP 135/77   Pulse 93   Temp 98.1 F (36.7 C) (Oral)   Resp 16   SpO2 97%   Physical Exam Constitutional:      General: She is not in acute distress.    Appearance: Normal appearance. She is well-developed. She is not ill-appearing.  HENT:     Head: Normocephalic and atraumatic.     Nose: Nose normal.     Mouth/Throat:     Mouth: Mucous membranes are moist.     Pharynx: Oropharynx is clear.  Eyes:     General: No scleral icterus.    Extraocular Movements: Extraocular movements intact.     Pupils: Pupils are equal, round, and reactive to light.  Cardiovascular:     Rate and Rhythm: Normal rate.  Pulmonary:     Effort: Pulmonary effort is normal.  Skin:    General: Skin is warm and dry.       Neurological:     General: No focal deficit present.     Mental Status: She is alert and oriented to person, place, and time.  Psychiatric:        Mood and Affect: Mood normal.        Behavior: Behavior normal.    Assessment and Plan :   I have reviewed the PDMP during this encounter.  1. Herpes zoster without complication     Will have patient start valacyclovir, use Lyrica as needed.  Maintain all other regular medications. Counseled patient on potential for adverse effects with medications prescribed/recommended today, ER  and return-to-clinic precautions discussed, patient verbalized understanding.    Jaynee Eagles, [place] 01/21/21 [phone]

## 2021-01-21 NOTE — ED Triage Notes (Signed)
PT believes she has shingles. She has had it twice before. Burning, itching rash on right side of neck. Noticed rash Wednesday and discomfort began yesterday.

## 2021-02-25 DIAGNOSIS — I10 Essential (primary) hypertension: Secondary | ICD-10-CM | POA: Diagnosis not present

## 2021-02-25 DIAGNOSIS — E782 Mixed hyperlipidemia: Secondary | ICD-10-CM | POA: Diagnosis not present

## 2021-03-02 ENCOUNTER — Inpatient Hospital Stay (HOSPITAL_COMMUNITY): Payer: Medicare HMO | Attending: Hematology

## 2021-03-02 ENCOUNTER — Other Ambulatory Visit: Payer: Self-pay

## 2021-03-02 DIAGNOSIS — R079 Chest pain, unspecified: Secondary | ICD-10-CM | POA: Insufficient documentation

## 2021-03-02 DIAGNOSIS — Z83518 Family history of other specified eye disorder: Secondary | ICD-10-CM | POA: Insufficient documentation

## 2021-03-02 DIAGNOSIS — Z882 Allergy status to sulfonamides status: Secondary | ICD-10-CM | POA: Diagnosis not present

## 2021-03-02 DIAGNOSIS — Z823 Family history of stroke: Secondary | ICD-10-CM | POA: Insufficient documentation

## 2021-03-02 DIAGNOSIS — Z888 Allergy status to other drugs, medicaments and biological substances status: Secondary | ICD-10-CM | POA: Diagnosis not present

## 2021-03-02 DIAGNOSIS — D709 Neutropenia, unspecified: Secondary | ICD-10-CM

## 2021-03-02 DIAGNOSIS — Z886 Allergy status to analgesic agent status: Secondary | ICD-10-CM | POA: Diagnosis not present

## 2021-03-02 DIAGNOSIS — R634 Abnormal weight loss: Secondary | ICD-10-CM | POA: Diagnosis not present

## 2021-03-02 DIAGNOSIS — Z8249 Family history of ischemic heart disease and other diseases of the circulatory system: Secondary | ICD-10-CM | POA: Diagnosis not present

## 2021-03-02 DIAGNOSIS — Z7952 Long term (current) use of systemic steroids: Secondary | ICD-10-CM | POA: Insufficient documentation

## 2021-03-02 DIAGNOSIS — M069 Rheumatoid arthritis, unspecified: Secondary | ICD-10-CM | POA: Insufficient documentation

## 2021-03-02 DIAGNOSIS — R251 Tremor, unspecified: Secondary | ICD-10-CM | POA: Diagnosis not present

## 2021-03-02 DIAGNOSIS — F17210 Nicotine dependence, cigarettes, uncomplicated: Secondary | ICD-10-CM | POA: Insufficient documentation

## 2021-03-02 DIAGNOSIS — M2550 Pain in unspecified joint: Secondary | ICD-10-CM | POA: Diagnosis not present

## 2021-03-02 DIAGNOSIS — Z818 Family history of other mental and behavioral disorders: Secondary | ICD-10-CM | POA: Diagnosis not present

## 2021-03-02 DIAGNOSIS — Z833 Family history of diabetes mellitus: Secondary | ICD-10-CM | POA: Insufficient documentation

## 2021-03-02 DIAGNOSIS — R059 Cough, unspecified: Secondary | ICD-10-CM | POA: Diagnosis not present

## 2021-03-02 DIAGNOSIS — Z79899 Other long term (current) drug therapy: Secondary | ICD-10-CM | POA: Insufficient documentation

## 2021-03-02 DIAGNOSIS — D72820 Lymphocytosis (symptomatic): Secondary | ICD-10-CM | POA: Insufficient documentation

## 2021-03-02 DIAGNOSIS — Z885 Allergy status to narcotic agent status: Secondary | ICD-10-CM | POA: Insufficient documentation

## 2021-03-02 DIAGNOSIS — R519 Headache, unspecified: Secondary | ICD-10-CM | POA: Insufficient documentation

## 2021-03-02 DIAGNOSIS — F32A Depression, unspecified: Secondary | ICD-10-CM | POA: Insufficient documentation

## 2021-03-02 DIAGNOSIS — Z809 Family history of malignant neoplasm, unspecified: Secondary | ICD-10-CM | POA: Diagnosis not present

## 2021-03-02 DIAGNOSIS — R200 Anesthesia of skin: Secondary | ICD-10-CM | POA: Insufficient documentation

## 2021-03-02 LAB — CBC WITH DIFFERENTIAL/PLATELET
Abs Immature Granulocytes: 0 10*3/uL (ref 0.00–0.07)
Basophils Absolute: 0.1 10*3/uL (ref 0.0–0.1)
Basophils Relative: 1 %
Eosinophils Absolute: 0 10*3/uL (ref 0.0–0.5)
Eosinophils Relative: 0 %
HCT: 38.3 % (ref 36.0–46.0)
Hemoglobin: 13.6 g/dL (ref 12.0–15.0)
Immature Granulocytes: 0 %
Lymphocytes Relative: 96 %
Lymphs Abs: 8.2 10*3/uL — ABNORMAL HIGH (ref 0.7–4.0)
MCH: 34.1 pg — ABNORMAL HIGH (ref 26.0–34.0)
MCHC: 35.5 g/dL (ref 30.0–36.0)
MCV: 96 fL (ref 80.0–100.0)
Monocytes Absolute: 0.2 10*3/uL (ref 0.1–1.0)
Monocytes Relative: 2 %
Neutro Abs: 0.1 10*3/uL — CL (ref 1.7–7.7)
Neutrophils Relative %: 1 %
Platelets: 302 10*3/uL (ref 150–400)
RBC: 3.99 MIL/uL (ref 3.87–5.11)
RDW: 19.1 % — ABNORMAL HIGH (ref 11.5–15.5)
WBC: 8.5 10*3/uL (ref 4.0–10.5)
nRBC: 0 % (ref 0.0–0.2)

## 2021-03-02 LAB — LACTATE DEHYDROGENASE: LDH: 153 U/L (ref 98–192)

## 2021-03-02 NOTE — Progress Notes (Unsigned)
CRITICAL VALUE ALERT Critical value received:  ANC 0.1 Date of notification:  03/02/2021 Time of notification: 11:34 am  Critical value read back:  Yes.   Nurse who received alert:   B Tondalaya Perren RN  MD notified time and response:   Dr. Delton Coombes / Lora Havens RN .

## 2021-03-03 ENCOUNTER — Other Ambulatory Visit (HOSPITAL_COMMUNITY): Payer: Medicare HMO

## 2021-03-10 ENCOUNTER — Ambulatory Visit (HOSPITAL_COMMUNITY): Payer: Medicare HMO | Admitting: Hematology

## 2021-03-14 ENCOUNTER — Inpatient Hospital Stay (HOSPITAL_COMMUNITY): Payer: Medicare HMO | Admitting: Hematology

## 2021-03-14 ENCOUNTER — Other Ambulatory Visit (HOSPITAL_COMMUNITY): Payer: Self-pay | Admitting: Hematology

## 2021-03-14 ENCOUNTER — Other Ambulatory Visit: Payer: Self-pay

## 2021-03-14 ENCOUNTER — Other Ambulatory Visit (HOSPITAL_COMMUNITY): Payer: Self-pay

## 2021-03-14 VITALS — BP 136/69 | HR 92 | Temp 97.9°F | Resp 16 | Ht 61.0 in | Wt 96.0 lb

## 2021-03-14 DIAGNOSIS — R251 Tremor, unspecified: Secondary | ICD-10-CM | POA: Diagnosis not present

## 2021-03-14 DIAGNOSIS — R200 Anesthesia of skin: Secondary | ICD-10-CM | POA: Diagnosis not present

## 2021-03-14 DIAGNOSIS — M2550 Pain in unspecified joint: Secondary | ICD-10-CM | POA: Diagnosis not present

## 2021-03-14 DIAGNOSIS — D72820 Lymphocytosis (symptomatic): Secondary | ICD-10-CM | POA: Diagnosis not present

## 2021-03-14 DIAGNOSIS — M069 Rheumatoid arthritis, unspecified: Secondary | ICD-10-CM | POA: Diagnosis not present

## 2021-03-14 DIAGNOSIS — D709 Neutropenia, unspecified: Secondary | ICD-10-CM

## 2021-03-14 DIAGNOSIS — C91Z0 Other lymphoid leukemia not having achieved remission: Secondary | ICD-10-CM

## 2021-03-14 DIAGNOSIS — R079 Chest pain, unspecified: Secondary | ICD-10-CM | POA: Diagnosis not present

## 2021-03-14 DIAGNOSIS — R519 Headache, unspecified: Secondary | ICD-10-CM | POA: Diagnosis not present

## 2021-03-14 DIAGNOSIS — F32A Depression, unspecified: Secondary | ICD-10-CM | POA: Diagnosis not present

## 2021-03-14 DIAGNOSIS — R059 Cough, unspecified: Secondary | ICD-10-CM | POA: Diagnosis not present

## 2021-03-14 MED ORDER — PREDNISONE 20 MG PO TABS: 20.0000 mg | ORAL_TABLET | Freq: Every day | ORAL | 1 refills | Status: DC

## 2021-03-14 MED ORDER — METHOTREXATE SODIUM 5 MG PO TABS
5.0000 mg | ORAL_TABLET | ORAL | 1 refills | Status: DC
  Filled 2021-03-14: qty 4, 28d supply, fill #0

## 2021-03-14 MED ORDER — METHOTREXATE SODIUM 2.5 MG PO TABS
5.0000 mg | ORAL_TABLET | ORAL | 1 refills | Status: DC
  Filled 2021-03-14: qty 8, 28d supply, fill #0

## 2021-03-14 MED ORDER — PROCHLORPERAZINE MALEATE 5 MG PO TABS: 5.0000 mg | ORAL_TABLET | Freq: Four times a day (QID) | ORAL | 0 refills | Status: DC | PRN

## 2021-03-14 NOTE — Patient Instructions (Addendum)
Sheri Pope at [facility] Discharge Instructions   You were seen and examined today by Dr. Delton Coombes.  He reviewed your lab work today which shows your neutrophil count (the white blood cells that fight infection) is still extremely low.    He has discussed with you treatment options to help your counts improve. Treatment would be in the pill form of two different pills - prednisone and methotrexate.  You would eventually come off of the steroid (after about 2 months) and take methotrexate only.   Prescriptions for prednisone has been sent to your pharmacy.  We sent a prescription for methotrexate to [facility].  They will arrange for this tablet to be delivered to your home via UPS or FedEx.   Start prednisone tomorrow - start out with only 1/2 tablet daily with breakfast.  We will increase the dose as tolerated.  We also sent a nausea medication to your pharmacy, Compazine, that can be used 1 pill every 6 hours as needed for nausea.  We want you to have this on hand in case the methotrexate causes you any nausea.    Return as scheduled for lab check and office visit.      Thank you for choosing [facility] to provide your oncology and hematology care.  To afford each patient quality time with our provider, please arrive at least 15 minutes before your scheduled appointment time.   If you have a lab appointment with the [facility] please come in thru the Main Entrance and check in at the main information desk.  You need to re-schedule your appointment should you arrive 10 or more minutes late.  We strive to give you quality time with our providers, and arriving late affects you and other patients whose appointments are after yours.  Also, if you no show three or more times for appointments you may be dismissed from the clinic at the providers discretion.     Again, thank you for choosing [facility].  Our hope is that these requests will decrease the amount of time that you wait before being seen by our physicians.       _____________________________________________________________  Should you have questions after your visit to [facility], please contact our office at [phone] and follow the prompts.  Our office hours are 8:00 a.m. and 4:30 p.m. Monday - Friday.  Please note that voicemails left after 4:00 p.m. may not be returned until the following business day.  We are closed weekends and major holidays.  You do have access to a nurse 24-7, just call the main number to the clinic [phone] and do not press any options, hold on the line and a nurse will answer the phone.    For prescription refill requests, have your pharmacy contact our office and allow 72 hours.    Due to Covid, you will need to wear a mask upon entering the hospital. If you do not have a mask, a mask will be given to you at the Main Entrance upon arrival. For doctor visits, patients may have 1 support person age 20 or older with them. For treatment visits, patients can not have anyone with them due to social distancing guidelines and our immunocompromised population.

## 2021-03-14 NOTE — Progress Notes (Signed)
Sheri Pope, [place] [id number]   CLINIC:  Medical Oncology/Hematology  PCP:  Celene Squibb, MD [address] [place] [address]  [phone]  REASON FOR VISIT:  Follow-up for lymphocytosis and neutropenia  PRIOR THERAPY: Splenectomy in 1970's  CURRENT THERAPY: surveillance  INTERVAL HISTORY:  Sheri Pope, a 81 y.o. female, returns for routine follow-up for her lymphocytosis and neutropenia. Sheri Pope was last seen on 11/24/2020.  Today she reports feeling good. She reports having shingles over thanksgiving; the shingles is present on her right shoulder and right side of her face. She continues to have episodes of shaking above her waist for periods of 30 minutes to 1 hour. She reports episode every 1-2 weeks. She denies recent infections. She denies chills and fevers. She is using ASPERCREME for her RA. She denies any weight loss since her last visit. She reports previously taking prednisone which she reports caused severe chest pain at that time. She reports a rash 2 weeks ago on her right leg which resolved after 2-3 days. She denies history of cardiac issue and MI. She denies abdominal pain.   REVIEW OF SYSTEMS:  Review of Systems  Constitutional:  Negative for appetite change, chills, fatigue, fever and unexpected weight change.  HENT:   Positive for trouble swallowing.   Respiratory:  Positive for cough.   Gastrointestinal:  Negative for abdominal pain.  Musculoskeletal:  Positive for arthralgias (8/10).  Neurological:  Positive for headaches and numbness.  Psychiatric/Behavioral:  Positive for depression. The patient is nervous/anxious.   All other systems reviewed and are negative.  PAST MEDICAL/SURGICAL HISTORY:  Past Medical History:  Diagnosis Date   Arthritis    RA AND OA --PAIN AND SWELLIN IN LEFT KNEE   Blood in urine    NEGATIVE UROLOGY WORK UP --BUT ALWAYS HAS BLOOD IN URINE   GERD (gastroesophageal reflux  disease)    H/O hiatal hernia    Hyperlipidemia    Leukemia (HCC)    PONV (postoperative nausea and vomiting)    ALWAYS SICK AFTER SURGERIES EXCEPT AFTER HIP REPLACMENT 2007   Vitamin D deficiency    Past Surgical History:  Procedure Laterality Date   ABDOMINAL HYSTERECTOMY  1870'S   BREAST SURGERY     BREAST BIOSPIES   CATARACT EXTRACTION W/PHACO Left 05/30/2012   Procedure: CATARACT EXTRACTION PHACO AND INTRAOCULAR LENS PLACEMENT ([facility]);  Surgeon: Tonny Branch, MD;  Location: [facility];  Service: Ophthalmology;  Laterality: Left;  CDE: 16.99   CATARACT EXTRACTION W/PHACO Right 06/24/2012   Procedure: CATARACT EXTRACTION PHACO AND INTRAOCULAR LENS PLACEMENT ([facility]);  Surgeon: Tonny Branch, MD;  Location: [facility];  Service: Ophthalmology;  Laterality: Right;  CDE:17.92   COLONOSCOPY WITH PROPOFOL N/A 10/14/2020   Procedure: COLONOSCOPY WITH PROPOFOL;  Surgeon: Daneil Dolin, MD;  Location: [facility];  Service: Endoscopy;  Laterality: N/A;  ASA III / 12:30   ESOPHAGOGASTRODUODENOSCOPY (EGD) WITH PROPOFOL N/A 10/14/2020   Procedure: ESOPHAGOGASTRODUODENOSCOPY (EGD) WITH PROPOFOL;  Surgeon: Daneil Dolin, MD;  Location: [facility];  Service: Endoscopy;  Laterality: N/A;   HIP SURGERY     JOINT REPLACEMENT  2007   RIGHT HIP REPLACEMENT   LEFT KNEE ARTHROSCOPY  AUG 2012   MALONEY DILATION  10/14/2020   Procedure: MALONEY DILATION;  Surgeon: Daneil Dolin, MD;  Location: [facility];  Service: Endoscopy;;   MULTIPLE ORTHOPEDIC SURGERIES     ON BOTH HANDS AND BOTH  FEET, RIGHT ELBOW   SPLEENECTOMY  IN THE 70'S   FOR LARGE CYST   TOTAL KNEE ARTHROPLASTY  11/10/2011   Procedure: TOTAL KNEE ARTHROPLASTY;  Surgeon: Mcarthur Rossetti, MD;  Location: [facility];  Service: Orthopedics;  Laterality: Left;  Left Total Knee Arthroplasty   YAG LASER APPLICATION Bilateral    Dr. Geoffry Paradise    SOCIAL HISTORY:  Social History   Socioeconomic History   Marital status: Widowed    Spouse name: Not on file    Number of children: Not on file   Years of education: Not on file   Highest education level: Not on file  Occupational History   Occupation: retired  Tobacco Use   Smoking status: Every Day    Packs/day: 1.00    Years: 50.00    Pack years: 50.00    Types: Cigarettes   Smokeless tobacco: Never  Vaping Use   Vaping Use: Never used  Substance and Sexual Activity   Alcohol use: No   Drug use: No   Sexual activity: Not Currently    Birth control/protection: Surgical  Other Topics Concern   Not on file  Social History Narrative   Not on file   Social Determinants of Health   Financial Resource Strain: Not on file  Food Insecurity: Not on file  Transportation Needs: Not on file  Physical Activity: Not on file  Stress: Not on file  Social Connections: Not on file  Intimate Partner Violence: Not on file    FAMILY HISTORY:  Family History  Problem Relation Age of Onset   Diabetes Sister    Macular degeneration Sister    Heart attack Mother    Stroke Father    Dementia Father    Cancer Brother    Healthy Sister    Healthy Sister    Diabetes Brother     CURRENT MEDICATIONS:  Current Outpatient Medications  Medication Sig Dispense Refill   ALPRAZolam (XANAX) 0.25 MG tablet Take 0.125 mg by mouth at bedtime.     HYDROcodone-acetaminophen (NORCO/VICODIN) 5-325 MG tablet Take 0.5 tablets by mouth 2 (two) times daily.     hydrocortisone (ANUSOL-HC) 25 MG suppository Place 1 suppository (25 mg total) rectally 2 (two) times daily. 12 suppository 0   lactose free nutrition (BOOST) LIQD Take 237 mLs by mouth daily.     methocarbamol (ROBAXIN) 500 MG tablet Take 500 mg by mouth 2 (two) times daily.     methotrexate (RHEUMATREX) 5 MG tablet Take 1 tablet (5 mg total) by mouth once a week. Caution: Chemotherapy. Protect from light. 4 tablet 1   Polyvinyl Alcohol-Povidone (REFRESH OP) Place 1 drop into both eyes 4 (four) times daily as needed (dry eyes).     predniSONE  (DELTASONE) 20 MG tablet Take 1 tablet (20 mg total) by mouth daily with breakfast. 30 tablet 1   prochlorperazine (COMPAZINE) 5 MG tablet Take 1 tablet (5 mg total) by mouth every 6 (six) hours as needed for nausea or vomiting. 30 tablet 0   trolamine salicylate (ASPERCREME) 10 % cream Apply 1 application topically as needed for muscle pain.     No current facility-administered medications for this visit.    ALLERGIES:  Allergies  Allergen Reactions   Codeine Nausea And Vomiting   Prednisone Other (See Comments)    Thought she was having a heart attack   Sulfa Antibiotics Other (See Comments)    unknown   Aleve [Naproxen] Anxiety    Jittery and hot flashes  Latex Rash    PHYSICAL EXAM:  Performance status (ECOG): 1 - Symptomatic but completely ambulatory  Vitals:   03/14/21 0936  BP: 136/69  Pulse: 92  Resp: 16  Temp: 97.9 F (36.6 C)  SpO2: 99%   Wt Readings from Last 3 Encounters:  03/14/21 96 lb (43.5 kg)  11/24/20 95 lb 4.8 oz (43.2 kg)  10/12/20 96 lb (43.5 kg)   Physical Exam Vitals reviewed.  Constitutional:      Appearance: Normal appearance.  Cardiovascular:     Rate and Rhythm: Normal rate and regular rhythm.     Pulses: Normal pulses.     Heart sounds: Normal heart sounds.  Pulmonary:     Effort: Pulmonary effort is normal.     Breath sounds: Normal breath sounds.  Lymphadenopathy:     Cervical: No cervical adenopathy.     Right cervical: No superficial, deep or posterior cervical adenopathy.    Left cervical: No superficial, deep or posterior cervical adenopathy.     Upper Body:     Right upper body: No supraclavicular, axillary or pectoral adenopathy.     Left upper body: No supraclavicular, axillary or pectoral adenopathy.  Neurological:     General: No focal deficit present.     Mental Status: She is alert and oriented to person, place, and time.  Psychiatric:        Mood and Affect: Mood normal.        Behavior: Behavior normal.     LABORATORY DATA:  I have reviewed the labs as listed.  CBC Latest Ref Rng & Units 03/02/2021 11/17/2020 10/12/2020  WBC 4.0 - 10.5 K/uL 8.5 13.9(H) 14.2(H)  Hemoglobin 12.0 - 15.0 g/dL 13.6 13.5 13.4  Hematocrit 36.0 - 46.0 % 38.3 39.4 39.1  Platelets 150 - 400 K/uL 302 295 329   CMP Latest Ref Rng & Units 08/22/2020 08/21/2020 11/06/2019  Glucose 70 - 99 mg/dL 91 93 -  BUN 8 - 23 mg/dL 10 13 -  Creatinine 0.44 - 1.00 mg/dL 0.57 0.55 0.60  Sodium 135 - 145 mmol/L 132(L) 129(L) -  Potassium 3.5 - 5.1 mmol/L 3.5 3.9 -  Chloride 98 - 111 mmol/L 101 98 -  CO2 22 - 32 mmol/L 27 25 -  Calcium 8.9 - 10.3 mg/dL 8.5(L) 9.0 -  Total Protein 6.5 - 8.1 g/dL - 7.8 -  Total Bilirubin 0.3 - 1.2 mg/dL - 0.4 -  Alkaline Phos 38 - 126 U/L - 92 -  AST 15 - 41 U/L - 17 -  ALT 0 - 44 U/L - 17 -      Component Value Date/Time   RBC 3.99 03/02/2021 1047   MCV 96.0 03/02/2021 1047   MCH 34.1 (H) 03/02/2021 1047   MCHC 35.5 03/02/2021 1047   RDW 19.1 (H) 03/02/2021 1047   LYMPHSABS 8.2 (H) 03/02/2021 1047   MONOABS 0.2 03/02/2021 1047   EOSABS 0.0 03/02/2021 1047   BASOSABS 0.1 03/02/2021 1047    DIAGNOSTIC IMAGING:  I have independently reviewed the scans and discussed with the patient. No results found.   ASSESSMENT:  1.  Lymphocytic leukocytosis: -CBC on 10-13-2019 shows white count 13.5, with differential showing 2% neutrophils, 94% lymphocytes, 3% monocytes and 1% basophils. -Hemoglobin and platelet count were normal. -She had history of splenectomy at age 69 secondary to a cyst on it. -She reported 5 to 10 pound weight loss in the last 6 months but denies any fevers or night sweats. -Reports decreased appetite and  decreased energy levels for the last 5 to 6 weeks. -CT CAP on 11/06/2019 showed a borderline prominent right axillary lymph node, 1.4 cm right upper normal-sized left axillary and right subpectoral lymph nodes with no overtly pathologic adenopathy.  Mild chronic diffuse prominence of  thyroid gland without discrete nodule. - T-cell receptor gene rearrangement showed clonal T-cell gamma population as well as clonal T-cell beta population detected.  This usually reflects presence of T-cell lymphocytic neoplasm. - Bone marrow biopsy on 08/11/2020 shows normocellular bone marrow for age, moderate involvement by interstitial T-cell infiltrates.  This favors large granular lymphocytic leukemia. 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  This can be detected in 30 to 40% cases of T-cell LGL leukemia.  Polytypic plasma cells about 10% were seen.  Chromosome analysis was 9, XX.   2.  Social/family history: -She lives at home with her nephew.  She did office work prior to retirement. -She is a current active smoker, 1 pack/day for 50+ years. -Brother had cancer, type unknown.   3.  Rheumatoid arthritis: -She has rheumatoid arthritis of the extremities, predominantly in the upper extremities with rheumatoid nodules. -Uses Voltaren and aspirin cream.   PLAN:  1.  Large granular lymphocytic leukemia: - She had shingles on the right shoulder since last visit.  No other infections reported. - She denies any fevers or weight loss. - She reports shaking episodes of the upper trunk happening every 3days to 2 weeks, lasting 30 to 90 minutes. - We have reviewed her labs from 03/02/2021.  White count is 8.5 with 96% lymphocytes and 1% neutrophils.  ANC 0.1. - Because of severe neutropenia and shaking episodes, I have recommended treatment of LGL. - We discussed treatment in the form of weekly low-dose methotrexate (10 mg/M square weekly).  We will start her at 5 mg weekly dose and titrate up as she had difficulty tolerating multiple medications in the past. - We will also start prednisone 20 mg daily instead of 1 mg/kg dose which she will have a hard time tolerating based on previous history again. - Prednisone will be tapered off after first month completely by end of second month. - We discussed  treatment side effects in detail.  We will also give her prescription for Compazine 5 mg every 6 hours as needed. - We will plan to follow-up in 2 weeks with repeat labs and see how she is tolerating it.   2.  Weight loss: - Weight has been stable lately. - Last CT CAP in September 2021 did not show any clear etiology for weight loss.  3.  Rheumatoid arthritis: - She has RA involving upper and lower extremities. - She was treated with prednisone many years ago, currently not on any treatment.  Orders placed this encounter:  Orders Placed This Encounter  Procedures   CBC with Differential   Comprehensive metabolic panel     Derek Jack, MD [place] [phone]   I, Thana Ates, am acting as a scribe for Dr. Derek Jack.  I, Derek Jack MD, have reviewed the above documentation for accuracy and completeness, and I agree with the above.

## 2021-03-16 ENCOUNTER — Other Ambulatory Visit (HOSPITAL_COMMUNITY): Payer: Self-pay

## 2021-03-18 ENCOUNTER — Telehealth (HOSPITAL_COMMUNITY): Payer: Self-pay | Admitting: *Deleted

## 2021-03-18 NOTE — Telephone Encounter (Signed)
Pt called into clinic stating she changed her mind about taking the Methotrexate Dr.K prescribed. She stated she read all the side effects of the medication and decided not to take it. Pt also stated she already has side effects from other medications she is currently taking. She also decided to stop taking the prednisone after taking it for four days because it was making her shake.  Dr.K made aware and he stated for pt to take the Methotrexate for a week to see how if takes her feel. Pt refused to the medication at all, Dr.K aware and stated to schedule pt for a  3 month follow-up with labs. Called pt to let know of new appointment and she verbalized understanding.

## 2021-03-21 ENCOUNTER — Other Ambulatory Visit: Payer: Self-pay

## 2021-03-21 ENCOUNTER — Ambulatory Visit: Payer: Medicare HMO | Admitting: Gastroenterology

## 2021-03-21 ENCOUNTER — Encounter: Payer: Self-pay | Admitting: Gastroenterology

## 2021-03-21 ENCOUNTER — Encounter: Payer: Self-pay | Admitting: *Deleted

## 2021-03-21 VITALS — BP 158/78 | HR 85 | Temp 97.5°F | Ht 61.0 in | Wt 95.6 lb

## 2021-03-21 DIAGNOSIS — K625 Hemorrhage of anus and rectum: Secondary | ICD-10-CM | POA: Diagnosis not present

## 2021-03-21 DIAGNOSIS — R1319 Other dysphagia: Secondary | ICD-10-CM

## 2021-03-21 DIAGNOSIS — R1310 Dysphagia, unspecified: Secondary | ICD-10-CM | POA: Insufficient documentation

## 2021-03-21 NOTE — Progress Notes (Signed)
Primary Care Physician: Celene Squibb, MD  Primary Gastroenterologist:  Garfield Cornea, MD   Chief Complaint  Patient presents with   Dysphagia    Worse since having EGD. Has choking sensation. Pills get stuck   rectal bleeding    Thinks bleeding is from hemorrhoids. Wasn't able to insert suppository    HPI: Sheri Pope is a 81 y.o. female here for follow-up.  Patient is followed by hematology for large granular lymphocytic leukemia, recently advised to start on methotrexate and prednisone.  Patient however called the clinic after her office visit and stated that she changed her mind about the methotrexate.  She stopped the prednisone because of shakes.  Plans for 65-month follow-up.  She was seen by Dr. Gala Romney while in the hospital back in June when she presented with rectal bleeding of two days, associated with small clots.  She had an occasional NSAID use, Alka-Seltzer.  Initially her hemoglobin was 13.7.  At time of discharge her hemoglobin was 12.  Most recent hemoglobin this month, 13.6.  CT abdomen pelvis with contrast while hospitalized in June showed scattered colonic diverticulosis, similar in size indeterminant 1 cm left hepatic lesion, given chronicity felt to be benign.  Left upper quadrant splenosis in a patient status post splenectomy.  Patient was offered inpatient work-up but she wanted to go home and return at a later date.  She completed EGD and colonoscopy in August 2022 with findings as outlined below.  It was felt that her bleeding likely was due to internal hemorrhoids although diverticular bleed could not be excluded.  Today: Patient states she was not able to really use Anusol suppositories because of her rheumatoid arthritis.  By the time she was able to manipulate the suppository with her hand deformities, it would melt.  Her daughter placed a couple for her but patient elected to not continue treatment. And bleeding resolved with two suppositories. She did well  up until just recently. She has had a couple of episodes of small-volume hematochezia over the past couple of weeks.  Denies any rectal pain.  No melena.  Bowel movements are regular.  No straining.  She reports after having her endoscopy she had a lot of pain in her esophagus for couple of days.  Pain was off and on.  She felt like she was having spasms.  States she was not given any kind of postop instructions and do not know what to do.  She did not try to call her office for input.  After couple of days the pain went away.  She feels like her swallowing is no better and may actually be worse.  She really does not want to have another endoscopy.  She feels like pills are getting stuck in the upper chest.  Has to eat food to get them to go down.  Sometimes she strangles on liquids and even saliva when she is not eating.  Does not happen very often.  When she eats solid food it feels like the food goes down fine.  Denies heartburn.  No vomiting.  She has noticed some epigastric discomfort when she leans over on something.  Patient states she has a chronic cough which she contributes to smoking although she believes some of it may be reflux related.  Notes that her cough is slightly better after taking Mylanta at night.    EGD 09/2020: - Moderate Schatzki ring. - Small hiatal hernia. - The examination was otherwise normal. -  Normal duodenal bulb and second portion of the duodenum. - No specimens collected.  Colonoscopy 09/2020: - Diverticulosis in the sigmoid colon and in the descending colon. - Non-bleeding internal hemorrhoids. - No specimens collected. I suspect a significant hemorrhoidal disease likely the source of low-volume hematochezia seen previously.    Current Outpatient Medications  Medication Sig Dispense Refill   ALPRAZolam (XANAX) 0.25 MG tablet Take 0.125 mg by mouth at bedtime.     Alum & Mag Hydroxide-Simeth (MYLANTA PO) Take by mouth as needed.     HYDROcodone-acetaminophen  (NORCO/VICODIN) 5-325 MG tablet Take 0.5 tablets by mouth 2 (two) times daily.     lactose free nutrition (BOOST) LIQD Take 237 mLs by mouth daily.     methocarbamol (ROBAXIN) 500 MG tablet Take 500 mg by mouth 2 (two) times daily.     Polyvinyl Alcohol-Povidone (REFRESH OP) Place 1 drop into both eyes 4 (four) times daily as needed (dry eyes).     prochlorperazine (COMPAZINE) 5 MG tablet Take 1 tablet (5 mg total) by mouth every 6 (six) hours as needed for nausea or vomiting. 30 tablet 0   trolamine salicylate (ASPERCREME) 10 % cream Apply 1 application topically as needed for muscle pain.     No current facility-administered medications for this visit.    Allergies as of 03/21/2021 - Review Complete 03/21/2021  Allergen Reaction Noted   Codeine Nausea And Vomiting 10/26/2011   Prednisone Other (See Comments) 10/26/2011   Sulfa antibiotics Other (See Comments) 10/26/2011   Aleve [naproxen] Anxiety 10/21/2019   Latex Rash 10/26/2011    ROS:  General: Negative for anorexia, weight loss, fever, chills, fatigue, weakness.  See HPI ENT: Negative for hoarseness,  nasal congestion.  See HPI CV: Negative for chest pain, angina, palpitations, dyspnea on exertion, peripheral edema.  Respiratory: Negative for dyspnea at rest, dyspnea on exertion, cough, sputum, wheezing.  GI: See history of present illness. GU:  Negative for dysuria, hematuria, urinary incontinence, urinary frequency, nocturnal urination.  Endo: Negative for unusual weight change.    Physical Examination:   BP (!) 158/78    Pulse 85    Temp (!) 97.5 F (36.4 C) (Temporal)    Ht 5\' 1"  (1.549 m)    Wt 95 lb 9.6 oz (43.4 kg)    BMI 18.06 kg/m   General: Elderly thin Caucasian female in no acute distress.  Accompanied by her daughter.    Eyes: No icterus. Mouth: masked.  Abdomen: Bowel sounds are normal, nontender, nondistended, no hepatosplenomegaly or masses, no abdominal bruits or hernia , no rebound or guarding.    Extremities: No lower extremity edema. No clubbing or deformities. Neuro: Alert and oriented x 4   Skin: Warm and dry, no jaundice.   Psych: Alert and cooperative, normal mood and affect.  Labs:  Lab Results  Component Value Date   CREATININE 0.57 08/22/2020   BUN 10 08/22/2020   NA 132 (L) 08/22/2020   K 3.5 08/22/2020   CL 101 08/22/2020   CO2 27 08/22/2020   Lab Results  Component Value Date   WBC 8.5 03/02/2021   HGB 13.6 03/02/2021   HCT 38.3 03/02/2021   MCV 96.0 03/02/2021   PLT 302 03/02/2021   Lab Results  Component Value Date   ALT 17 08/21/2020   AST 17 08/21/2020   ALKPHOS 92 08/21/2020   BILITOT 0.4 08/21/2020      Imaging Studies: No results found.   Assessment:  Rectal bleeding: Hospitalized initially as outlined  above.  Bleeding was self-limited, hemoglobin remained normal.  Follow-up colonoscopy with diverticulosis and internal hemorrhoids (felt to be the source of her bleeding).  Unable to use topical hemorrhoid agents due to her severe RA, hand deformities.  Currently doing fairly well with only intermittent bleeding, no rectal pain.  She plans to just monitor her symptoms for now.  We briefly discussed hemorrhoid banding as an option became necessary.  She is aware that if she has significant rectal bleeding/persisting, she should be evaluated for other source besides hemorrhoids source.  She has diverticula which can also bleed but typically significant bleeding will occur lasting for couple of days.  Patient voiced understanding.  Dysphagia: Moderate Schatzki ring as outlined.  It is unclear whether she had esophageal dilation based on EGD report.  Patient describes 48 hours of esophageal pain post EGD, suspect she did have dilation.  Some of her symptoms sound more consistent with Zenker's diverticulum.  Cannot rule out oropharyngeal component as well.  Patient denies any history of aspiration/pneumonia.  We discussed barium esophagram  initially.   Plan: BPE Monitor rectal bleeding, if becomes more frequent she can consider hemorrhoid banding.  If she ever has heavy bleeding or bleeding persisting, she should let her provider know or go to the emergency department.

## 2021-03-21 NOTE — Patient Instructions (Signed)
For problems swallowing, we will schedule you for a barium test of your esophagus.  For hemorrhoids, you can monitor for now. If your bleeding becomes more frequent, you could consider hemorrhoid banding in our office. Always let a provider know if you are having persistent rectal bleeding or heavy rectal bleeding.

## 2021-03-25 ENCOUNTER — Ambulatory Visit (HOSPITAL_COMMUNITY)
Admission: RE | Admit: 2021-03-25 | Discharge: 2021-03-25 | Disposition: A | Discharge status: Home / Self Care | Payer: Medicare HMO | Source: Ambulatory Visit | Attending: Gastroenterology | Admitting: Gastroenterology

## 2021-03-25 ENCOUNTER — Other Ambulatory Visit: Payer: Self-pay

## 2021-03-25 DIAGNOSIS — I700 Atherosclerosis of aorta: Secondary | ICD-10-CM | POA: Diagnosis not present

## 2021-03-25 DIAGNOSIS — R1319 Other dysphagia: Secondary | ICD-10-CM | POA: Diagnosis not present

## 2021-03-25 DIAGNOSIS — K224 Dyskinesia of esophagus: Secondary | ICD-10-CM | POA: Diagnosis not present

## 2021-03-29 ENCOUNTER — Ambulatory Visit (HOSPITAL_COMMUNITY): Payer: Medicare HMO | Admitting: Hematology

## 2021-03-29 ENCOUNTER — Inpatient Hospital Stay (HOSPITAL_COMMUNITY): Payer: Medicare HMO

## 2021-05-31 ENCOUNTER — Ambulatory Visit
Admission: EM | Admit: 2021-05-31 | Discharge: 2021-05-31 | Disposition: A | Discharge status: Home / Self Care | Payer: Medicare HMO | Attending: Family Medicine | Admitting: Family Medicine

## 2021-05-31 DIAGNOSIS — R102 Pelvic and perineal pain: Secondary | ICD-10-CM | POA: Diagnosis not present

## 2021-05-31 DIAGNOSIS — R197 Diarrhea, unspecified: Secondary | ICD-10-CM | POA: Insufficient documentation

## 2021-05-31 DIAGNOSIS — R509 Fever, unspecified: Secondary | ICD-10-CM | POA: Diagnosis not present

## 2021-05-31 DIAGNOSIS — R1030 Lower abdominal pain, unspecified: Secondary | ICD-10-CM | POA: Insufficient documentation

## 2021-05-31 LAB — POCT URINALYSIS DIP (MANUAL ENTRY)
Glucose, UA: NEGATIVE mg/dL
Ketones, POC UA: NEGATIVE mg/dL
Leukocytes, UA: NEGATIVE
Nitrite, UA: NEGATIVE
Protein Ur, POC: 100 mg/dL — AB
Spec Grav, UA: >=1.03 — AB (ref 1.010–1.025)
Urobilinogen, UA: 0.2 E.U./dL
pH, UA: 5.5 (ref 5.0–8.0)

## 2021-05-31 MED ORDER — ONDANSETRON 4 MG PO TBDP: 4.0000 mg | ORAL_TABLET | Freq: Three times a day (TID) | ORAL | 0 refills | Status: DC | PRN

## 2021-05-31 NOTE — ED Provider Notes (Signed)
?[place] ? ? ? ?CSN: [id number] ?Arrival date & time: 05/31/21  0931 ? ? ?  ? ?History   ?Chief Complaint ?Chief Complaint  ?Patient presents with  ? UTI symptoms  ? ? ?HPI ?Sheri Pope is a 81 y.o. female.  ? ?Presenting today with 1 to 2-day history of lower abdominal pain and pressure, fever, chills, diarrhea, fatigue, nausea, decreased appetite.  Denies vomiting, bloody stools or dark stools, intolerance to p.o., weakness, dizziness, chest pain, shortness of breath, history of GI issues including diverticulitis/diverticulosis.  Does have a history of urinary tract infections and she is concerned that this may be what is going on.  Not trying anything over-the-counter for symptoms.  No new sick contacts, recent travel, new medications or dietary changes. ? ?Past Medical History:  ?Diagnosis Date  ? Arthritis   ? RA AND OA --PAIN AND SWELLIN IN LEFT KNEE  ? Blood in urine   ? NEGATIVE UROLOGY WORK UP --BUT ALWAYS HAS BLOOD IN URINE  ? GERD (gastroesophageal reflux disease)   ? H/O hiatal hernia   ? Hyperlipidemia   ? Leukemia ([place])   ? PONV (postoperative nausea and vomiting)   ? ALWAYS SICK AFTER SURGERIES EXCEPT AFTER HIP REPLACMENT 2007  ? Vitamin D deficiency   ? ? ?Patient Active Problem List  ? Diagnosis Date Noted  ? Dysphagia 03/21/2021  ? Rectal bleeding 08/21/2020  ? Rheumatoid arthritis ([place]) 08/21/2020  ? Leukocytosis 10/21/2019  ? Neutropenia ([place]) 10/21/2019  ? Trochanteric bursitis, left hip 12/19/2017  ? Degenerative arthritis of left knee 11/10/2011  ? ? ?Past Surgical History:  ?Procedure Laterality Date  ? ABDOMINAL HYSTERECTOMY  1870'S  ? BREAST SURGERY    ? BREAST BIOSPIES  ? CATARACT EXTRACTION W/PHACO Left 05/30/2012  ? Procedure: CATARACT EXTRACTION PHACO AND INTRAOCULAR LENS PLACEMENT (IOC);  Surgeon: Tonny Branch, MD;  Location: [facility];  Service: Ophthalmology;  Laterality: Left;  CDE: 16.99  ? CATARACT EXTRACTION W/PHACO Right 06/24/2012  ? Procedure: CATARACT EXTRACTION  PHACO AND INTRAOCULAR LENS PLACEMENT (IOC);  Surgeon: Tonny Branch, MD;  Location: [facility];  Service: Ophthalmology;  Laterality: Right;  CDE:17.92  ? COLONOSCOPY WITH PROPOFOL N/A 10/14/2020  ? Procedure: COLONOSCOPY WITH PROPOFOL;  Surgeon: Daneil Dolin, MD;  [facility];  Service: Endoscopy;  Laterality: N/A;  ASA III / 12:30  ? ESOPHAGOGASTRODUODENOSCOPY (EGD) WITH PROPOFOL N/A 10/14/2020  ? Procedure: ESOPHAGOGASTRODUODENOSCOPY (EGD) WITH PROPOFOL;  Surgeon: Daneil Dolin, MD;  [facility];  Service: Endoscopy;  Laterality: N/A;  ? HIP SURGERY    ? JOINT REPLACEMENT  2007  ? RIGHT HIP REPLACEMENT  ? LEFT KNEE ARTHROSCOPY  AUG 2012  ? MALONEY DILATION  10/14/2020  ? Procedure: MALONEY DILATION;  Surgeon: Daneil Dolin, MD;  [facility]: [facility];  Service: Endoscopy;;  ? MULTIPLE ORTHOPEDIC SURGERIES    ? ON BOTH HANDS AND BOTH FEET, RIGHT ELBOW  ? SPLEENECTOMY  IN THE 70'S  ? FOR LARGE CYST  ? TOTAL KNEE ARTHROPLASTY  11/10/2011  ? Procedure: TOTAL KNEE ARTHROPLASTY;  Surgeon: Mcarthur Rossetti, MD;  Location: [facility];  Service: Orthopedics;  Laterality: Left;  Left Total Knee Arthroplasty  ? YAG LASER APPLICATION Bilateral   ? Dr. Geoffry Paradise  ? ? ?OB History   ?No obstetric history on file. ?  ? ?Home Medications   ? ?Prior to Admission medications   ?Medication Sig Start Date End Date Taking? Authorizing Provider  ?ondansetron (ZOFRAN-ODT) 4 MG  disintegrating tablet Take 1 tablet (4 mg total) by mouth every 8 (eight) hours as needed for nausea or vomiting. 05/31/21  Yes Volney American, PA-C  ?ALPRAZolam (XANAX) 0.25 MG tablet Take 0.125 mg by mouth at bedtime. 06/14/20   [provider]  ?Alum & Mag Hydroxide-Simeth (MYLANTA PO) Take by mouth as needed.    [provider]  ?HYDROcodone-acetaminophen (NORCO/VICODIN) 5-325 MG tablet Take 0.5 tablets by mouth 2 (two) times daily. 08/03/20   [provider]  ?lactose free nutrition (BOOST) LIQD Take 237 mLs by  mouth daily.    [provider]  ?methocarbamol (ROBAXIN) 500 MG tablet Take 500 mg by mouth 2 (two) times daily. 07/28/20   [provider]  ?Polyvinyl Alcohol-Povidone (REFRESH OP) Place 1 drop into both eyes 4 (four) times daily as needed (dry eyes).    [provider]  ?prochlorperazine (COMPAZINE) 5 MG tablet Take 1 tablet (5 mg total) by mouth every 6 (six) hours as needed for nausea or vomiting. 03/14/21   Derek Jack, MD  ?trolamine salicylate (ASPERCREME) 10 % cream Apply 1 application topically as needed for muscle pain.    [provider]  ? ? ?Family History ?Family History  ?Problem Relation Age of Onset  ? Diabetes Sister   ? Macular degeneration Sister   ? Heart attack Mother   ? Stroke Father   ? Dementia Father   ? Cancer Brother   ? Healthy Sister   ? Healthy Sister   ? Diabetes Brother   ? ? ?Social History ?Social History  ? ?Tobacco Use  ? Smoking status: Every Day  ?  Packs/day: 1.00  ?  Years: 50.00  ?  Pack years: 50.00  ?  Types: Cigarettes  ? Smokeless tobacco: Never  ?Vaping Use  ? Vaping Use: Never used  ?Substance Use Topics  ? Alcohol use: No  ? Drug use: No  ? ? ? ?Allergies   ?Codeine, Prednisone, Sulfa antibiotics, Aleve [naproxen], and Latex ? ? ?Review of Systems ?Review of Systems ?Per HPI ? ?Physical Exam ?Triage Vital Signs ?ED Triage Vitals  ?Enc Vitals Group  ?   BP 05/31/21 1023 138/79  ?   Pulse Rate 05/31/21 1023 86  ?   Resp 05/31/21 1023 16  ?   Temp 05/31/21 1023 97.8 ?F (36.6 ?C)  ?   Temp Source 05/31/21 1023 Oral  ?   SpO2 05/31/21 1023 94 %  ?   Weight --   ?   Height --   ?   Head Circumference --   ?   Peak Flow --   ?   Pain Score 05/31/21 1021 8  ?   Pain Loc --   ?   Pain Edu? --   ?   Excl. in [place]? --   ? ?No data found. ? ?Updated Vital Signs ?BP 138/79 (BP Location: Right Arm)   Pulse 86   Temp 97.8 ?F (36.6 ?C) (Oral)   Resp 16   SpO2 94%  ? ?Visual Acuity ?Right Eye Distance:   ?Left Eye Distance:   ?Bilateral  Distance:   ? ?Right Eye Near:   ?Left Eye Near:    ?Bilateral Near:    ? ?Physical Exam ?Vitals and nursing note reviewed.  ?Constitutional:   ?   Appearance: Normal appearance. She is not ill-appearing.  ?HENT:  ?   Head: Atraumatic.  ?Eyes:  ?   Extraocular Movements: Extraocular movements intact.  ?  Conjunctiva/sclera: Conjunctivae normal.  ?Cardiovascular:  ?   Rate and Rhythm: Normal rate and regular rhythm.  ?   Heart sounds: Normal heart sounds.  ?Pulmonary:  ?   Effort: Pulmonary effort is normal.  ?   Breath sounds: Normal breath sounds.  ?Abdominal:  ?   General: Bowel sounds are normal. There is no distension.  ?   Palpations: Abdomen is soft.  ?   Tenderness: There is abdominal tenderness. There is no right CVA tenderness, left CVA tenderness, guarding or rebound.  ?   Comments: Mild to moderate lower abdominal tenderness to palpation without distention or guarding  ?Musculoskeletal:     ?   General: Normal range of motion.  ?   Cervical back: Normal range of motion and neck supple.  ?Skin: ?   General: Skin is warm and dry.  ?Neurological:  ?   Mental Status: She is alert and oriented to person, place, and time.  ?   Motor: No weakness.  ?   Gait: Gait normal.  ?Psychiatric:     ?   Mood and Affect: Mood normal.     ?   Thought Content: Thought content normal.     ?   Judgment: Judgment normal.  ? ? ? ?UC Treatments / Results  ?Labs ?(all labs ordered are listed, but only abnormal results are displayed) ?Labs Reviewed  ?POCT URINALYSIS DIP (MANUAL ENTRY) - Abnormal; Notable for the following components:  ?    Result Value  ? Bilirubin, UA small (*)   ? Spec Grav, UA >=1.030 (*)   ? Blood, UA small (*)   ? Protein Ur, POC =100 (*)   ? All other components within normal limits  ?URINE CULTURE  ?CBC WITH DIFFERENTIAL/PLATELET  ?COMPREHENSIVE METABOLIC PANEL  ? ? ?EKG ? ? ?Radiology ?No results found. ? ?Procedures ?Procedures (including critical care time) ? ?Medications Ordered in UC ?Medications - No  data to display ? ?Initial Impression / Assessment and Plan / UC Course  ?I have reviewed the triage vital signs and the nursing notes. ? ?Pertinent labs & imaging results that were available during my care o

## 2021-05-31 NOTE — ED Triage Notes (Signed)
Pt states she thinks she has a UTI that started Sunday ? ?Pt states she has a lot of pressure in her lower stomach, fever, cold chills and diarrhea  ?

## 2021-06-02 ENCOUNTER — Ambulatory Visit (HOSPITAL_COMMUNITY)
Admission: RE | Admit: 2021-06-02 | Discharge: 2021-06-02 | Disposition: A | Discharge status: Home / Self Care | Payer: Medicare HMO | Source: Ambulatory Visit | Attending: Family Medicine | Admitting: Family Medicine

## 2021-06-02 ENCOUNTER — Other Ambulatory Visit (HOSPITAL_COMMUNITY): Payer: Self-pay | Admitting: Family Medicine

## 2021-06-02 DIAGNOSIS — K5730 Diverticulosis of large intestine without perforation or abscess without bleeding: Secondary | ICD-10-CM | POA: Diagnosis not present

## 2021-06-02 DIAGNOSIS — E785 Hyperlipidemia, unspecified: Secondary | ICD-10-CM | POA: Diagnosis not present

## 2021-06-02 DIAGNOSIS — E559 Vitamin D deficiency, unspecified: Secondary | ICD-10-CM | POA: Diagnosis not present

## 2021-06-02 DIAGNOSIS — R110 Nausea: Secondary | ICD-10-CM | POA: Diagnosis not present

## 2021-06-02 DIAGNOSIS — R109 Unspecified abdominal pain: Secondary | ICD-10-CM | POA: Diagnosis not present

## 2021-06-02 DIAGNOSIS — I700 Atherosclerosis of aorta: Secondary | ICD-10-CM | POA: Diagnosis not present

## 2021-06-02 DIAGNOSIS — R1030 Lower abdominal pain, unspecified: Secondary | ICD-10-CM

## 2021-06-02 DIAGNOSIS — R509 Fever, unspecified: Secondary | ICD-10-CM | POA: Diagnosis not present

## 2021-06-02 LAB — CBC WITH DIFFERENTIAL/PLATELET
Basophils Absolute: 0 10*3/uL (ref 0.0–0.2)
Basos: 0 %
EOS (ABSOLUTE): 0 10*3/uL (ref 0.0–0.4)
Eos: 0 %
Hematocrit: 40.5 % (ref 34.0–46.6)
Hemoglobin: 14.3 g/dL (ref 11.1–15.9)
Lymphocytes Absolute: 10.6 10*3/uL — ABNORMAL HIGH (ref 0.7–3.1)
Lymphs: 91 %
MCH: 33.1 pg — ABNORMAL HIGH (ref 26.6–33.0)
MCHC: 35.3 g/dL (ref 31.5–35.7)
MCV: 94 fL (ref 79–97)
Monocytes Absolute: 0.2 10*3/uL (ref 0.1–0.9)
Monocytes: 2 %
Neutrophils Absolute: 0.8 10*3/uL — ABNORMAL LOW (ref 1.4–7.0)
Neutrophils: 7 %
Platelets: 287 10*3/uL (ref 150–450)
RBC: 4.32 x10E6/uL (ref 3.77–5.28)
RDW: 14.7 % (ref 11.7–15.4)
WBC: 11.6 10*3/uL — ABNORMAL HIGH (ref 3.4–10.8)

## 2021-06-02 LAB — COMPREHENSIVE METABOLIC PANEL
ALT: 11 IU/L (ref 0–32)
AST: 16 IU/L (ref 0–40)
Albumin/Globulin Ratio: 0.8 — ABNORMAL LOW (ref 1.2–2.2)
Albumin: 3.6 g/dL — ABNORMAL LOW (ref 3.7–4.7)
Alkaline Phosphatase: 116 IU/L (ref 44–121)
BUN/Creatinine Ratio: 21 (ref 12–28)
BUN: 12 mg/dL (ref 8–27)
Bilirubin Total: 0.3 mg/dL (ref 0.0–1.2)
CO2: 20 mmol/L (ref 20–29)
Calcium: 9.3 mg/dL (ref 8.7–10.3)
Chloride: 94 mmol/L — ABNORMAL LOW (ref 96–106)
Creatinine, Ser: 0.58 mg/dL (ref 0.57–1.00)
Globulin, Total: 4.7 g/dL — ABNORMAL HIGH (ref 1.5–4.5)
Glucose: 110 mg/dL — ABNORMAL HIGH (ref 70–99)
Potassium: 4.4 mmol/L (ref 3.5–5.2)
Sodium: 127 mmol/L — ABNORMAL LOW (ref 134–144)
Total Protein: 8.3 g/dL (ref 6.0–8.5)
eGFR: 93 mL/min/{1.73_m2} (ref >59)

## 2021-06-02 MED ORDER — IOHEXOL 300 MG/ML  SOLN
100.0000 mL | Freq: Once | INTRAMUSCULAR | Status: AC | PRN
  Administered 2021-06-02: 75 mL via INTRAVENOUS

## 2021-06-02 MED ORDER — IOHEXOL 9 MG/ML PO SOLN
ORAL | Status: AC
  Filled 2021-06-02: qty 1000

## 2021-06-03 ENCOUNTER — Telehealth (HOSPITAL_COMMUNITY): Payer: Self-pay | Admitting: Emergency Medicine

## 2021-06-03 LAB — URINE CULTURE: Culture: 10000 — AB

## 2021-06-03 MED ORDER — NITROFURANTOIN MONOHYD MACRO 100 MG PO CAPS: 100.0000 mg | ORAL_CAPSULE | Freq: Two times a day (BID) | ORAL | 0 refills | Status: DC

## 2021-06-06 DIAGNOSIS — H04123 Dry eye syndrome of bilateral lacrimal glands: Secondary | ICD-10-CM | POA: Diagnosis not present

## 2021-06-16 ENCOUNTER — Inpatient Hospital Stay (HOSPITAL_COMMUNITY): Payer: Medicare HMO | Admitting: Hematology

## 2021-06-16 ENCOUNTER — Inpatient Hospital Stay (HOSPITAL_COMMUNITY): Payer: Medicare HMO

## 2021-06-16 ENCOUNTER — Inpatient Hospital Stay (HOSPITAL_COMMUNITY): Payer: Medicare HMO | Attending: Hematology

## 2021-06-16 ENCOUNTER — Ambulatory Visit (HOSPITAL_COMMUNITY): Payer: Medicare HMO | Admitting: Hematology

## 2021-06-16 ENCOUNTER — Other Ambulatory Visit (HOSPITAL_COMMUNITY): Payer: Self-pay | Admitting: *Deleted

## 2021-06-16 VITALS — BP 150/69 | HR 79 | Temp 97.7°F | Resp 18 | Ht 60.63 in | Wt 94.4 lb

## 2021-06-16 DIAGNOSIS — E559 Vitamin D deficiency, unspecified: Secondary | ICD-10-CM | POA: Insufficient documentation

## 2021-06-16 DIAGNOSIS — R5383 Other fatigue: Secondary | ICD-10-CM | POA: Diagnosis not present

## 2021-06-16 DIAGNOSIS — D709 Neutropenia, unspecified: Secondary | ICD-10-CM

## 2021-06-16 DIAGNOSIS — D72820 Lymphocytosis (symptomatic): Secondary | ICD-10-CM | POA: Insufficient documentation

## 2021-06-16 DIAGNOSIS — K219 Gastro-esophageal reflux disease without esophagitis: Secondary | ICD-10-CM | POA: Insufficient documentation

## 2021-06-16 DIAGNOSIS — M129 Arthropathy, unspecified: Secondary | ICD-10-CM | POA: Diagnosis not present

## 2021-06-16 DIAGNOSIS — F17210 Nicotine dependence, cigarettes, uncomplicated: Secondary | ICD-10-CM | POA: Diagnosis not present

## 2021-06-16 DIAGNOSIS — R634 Abnormal weight loss: Secondary | ICD-10-CM | POA: Diagnosis not present

## 2021-06-16 DIAGNOSIS — C91Z0 Other lymphoid leukemia not having achieved remission: Secondary | ICD-10-CM

## 2021-06-16 DIAGNOSIS — E785 Hyperlipidemia, unspecified: Secondary | ICD-10-CM | POA: Diagnosis not present

## 2021-06-16 DIAGNOSIS — M069 Rheumatoid arthritis, unspecified: Secondary | ICD-10-CM | POA: Insufficient documentation

## 2021-06-16 DIAGNOSIS — Z809 Family history of malignant neoplasm, unspecified: Secondary | ICD-10-CM | POA: Insufficient documentation

## 2021-06-16 LAB — COMPREHENSIVE METABOLIC PANEL
ALT: 20 U/L (ref 0–44)
AST: 24 U/L (ref 15–41)
Albumin: 3.3 g/dL — ABNORMAL LOW (ref 3.5–5.0)
Alkaline Phosphatase: 97 U/L (ref 38–126)
Anion gap: 6 (ref 5–15)
BUN: 13 mg/dL (ref 8–23)
CO2: 26 mmol/L (ref 22–32)
Calcium: 9.1 mg/dL (ref 8.9–10.3)
Chloride: 99 mmol/L (ref 98–111)
Creatinine, Ser: 0.61 mg/dL (ref 0.44–1.00)
GFR, Estimated: >60 mL/min (ref >60)
Glucose, Bld: 106 mg/dL — ABNORMAL HIGH (ref 70–99)
Potassium: 4.3 mmol/L (ref 3.5–5.1)
Sodium: 131 mmol/L — ABNORMAL LOW (ref 135–145)
Total Bilirubin: 0.3 mg/dL (ref 0.3–1.2)
Total Protein: 8.2 g/dL — ABNORMAL HIGH (ref 6.5–8.1)

## 2021-06-16 LAB — CBC WITH DIFFERENTIAL/PLATELET
Abs Immature Granulocytes: 0 10*3/uL (ref 0.00–0.07)
Basophils Absolute: 0.2 10*3/uL — ABNORMAL HIGH (ref 0.0–0.1)
Basophils Relative: 2 %
Eosinophils Absolute: 0 10*3/uL (ref 0.0–0.5)
Eosinophils Relative: 0 %
HCT: 41.9 % (ref 36.0–46.0)
Hemoglobin: 13.7 g/dL (ref 12.0–15.0)
Immature Granulocytes: 0 %
Lymphocytes Relative: 94 %
Lymphs Abs: 9.6 10*3/uL — ABNORMAL HIGH (ref 0.7–4.0)
MCH: 31.9 pg (ref 26.0–34.0)
MCHC: 32.7 g/dL (ref 30.0–36.0)
MCV: 97.4 fL (ref 80.0–100.0)
Monocytes Absolute: 0.3 10*3/uL (ref 0.1–1.0)
Monocytes Relative: 3 %
Neutro Abs: 0.1 10*3/uL — CL (ref 1.7–7.7)
Neutrophils Relative %: 1 %
Platelets: 313 10*3/uL (ref 150–400)
RBC: 4.3 MIL/uL (ref 3.87–5.11)
RDW: 17.8 % — ABNORMAL HIGH (ref 11.5–15.5)
WBC Morphology: REACTIVE
WBC: 10 10*3/uL (ref 4.0–10.5)
nRBC: 0 % (ref 0.0–0.2)

## 2021-06-16 LAB — LACTATE DEHYDROGENASE: LDH: 152 U/L (ref 98–192)

## 2021-06-16 NOTE — Patient Instructions (Addendum)
[employer] at [facility] ?Discharge Instructions ? ? ?You were seen and examined today by Dr. Delton Coombes. ? ?He reviewed the results of your lab work. Your neutrophil count remains severely low. This can put you at risk for life-threatening infections.  Any sign of infection you should report to the ER immediately. We will continue to watch for now since you are not interested in treatment at this time.  ? ?Return as scheduled.  ? ? ?Thank you for choosing [place] [facility] at [facility] to provide your oncology and hematology care.  To afford each patient quality time with our provider, please arrive at least 15 minutes before your scheduled appointment time.  ? ?If you have a lab appointment with the [facility] please come in thru the Main Entrance and check in at the main information desk. ? ?You need to re-schedule your appointment should you arrive 10 or more minutes late.  We strive to give you quality time with our providers, and arriving late affects you and other patients whose appointments are after yours.  Also, if you no show three or more times for appointments you may be dismissed from the clinic at the providers discretion.     ?Again, thank you for choosing [facility].  Our hope is that these requests will decrease the amount of time that you wait before being seen by our physicians.       ?_____________________________________________________________ ? ?Should you have questions after your visit to [facility], please contact our office at [phone] and follow the prompts.  Our office hours are 8:00 a.m. and 4:30 p.m. Monday - Friday.  Please note that voicemails left after 4:00 p.m. may not be returned until the following business day.  We are closed weekends and major holidays.  You do have access to a nurse 24-7, just call the main number to the clinic [phone] and do not press any options, hold on the line and a  nurse will answer the phone.   ? ?For prescription refill requests, have your pharmacy contact our office and allow 72 hours.   ? ?Due to Covid, you will need to wear a mask upon entering the hospital. If you do not have a mask, a mask will be given to you at the Main Entrance upon arrival. For doctor visits, patients may have 1 support person age 65 or older with them. For treatment visits, patients can not have anyone with them due to social distancing guidelines and our immunocompromised population.  ? ?   ?

## 2021-06-16 NOTE — Progress Notes (Signed)
CRITICAL VALUE ALERT ?Critical value received:  ANC 0.1 ?Date of notification:  06-16-21 ?Time of notification: 0016 ?Critical value read back:  Yes.   ?Nurse who received alert:  C. Kaliah Haddaway RN ?MD notified time and response:  4290, no new orders.   ?

## 2021-06-16 NOTE — Progress Notes (Signed)
? ?[facility] ?[address] ?[place], [place] [address] ? ? ?CLINIC:  ?Medical Oncology/Hematology ? ?PCP:  ?Celene Squibb, MD ?[address] [place] [address]  ?[phone] ? ?REASON FOR VISIT:  ?Follow-up for lymphocytosis and neutropenia ? ?PRIOR THERAPY: Splenectomy in 1970's ? ?CURRENT THERAPY: surveillance ? ?INTERVAL HISTORY:  ?Sheri Pope, a 81 y.o. female, returns for routine follow-up for her lymphocytosis and neutropenia. Sheri Pope was last seen on 03/14/2021. ? ?Today she reports feeling well. She reports she has a UTI and diverticulitis at the same time starting 4/6 for which she took amoxicillin and Macrobid; she reports these infections have since resolved. She reports episodes of "shakiness" occurring 2-3 times weekly which started with the infections on 4/6 and have occurred in 1 week. She reports stable arthritic pains for which she takes hydrocodone and methocarbamol. She has lost 2 lbs since her last visit.  ? ?REVIEW OF SYSTEMS:  ?Review of Systems  ?Constitutional:  Positive for fatigue and unexpected weight change (-2 lbs). Negative for appetite change.  ?HENT:   Positive for trouble swallowing.   ?Respiratory:  Positive for cough and shortness of breath.   ?Gastrointestinal:  Positive for nausea.  ?Musculoskeletal:  Positive for arthralgias (6/10).  ?Psychiatric/Behavioral:  Positive for depression. The patient is nervous/anxious.   ?All other systems reviewed and are negative. ? ?PAST MEDICAL/SURGICAL HISTORY:  ?Past Medical History:  ?Diagnosis Date  ? Arthritis   ? RA AND OA --PAIN AND SWELLIN IN LEFT KNEE  ? Blood in urine   ? NEGATIVE UROLOGY WORK UP --BUT ALWAYS HAS BLOOD IN URINE  ? GERD (gastroesophageal reflux disease)   ? H/O hiatal hernia   ? Hyperlipidemia   ? Leukemia ([place])   ? PONV (postoperative nausea and vomiting)   ? ALWAYS SICK AFTER SURGERIES EXCEPT AFTER HIP REPLACMENT 2007  ? Vitamin D deficiency   ? ?Past Surgical History:  ?Procedure  Laterality Date  ? ABDOMINAL HYSTERECTOMY  1870'S  ? BREAST SURGERY    ? BREAST BIOSPIES  ? CATARACT EXTRACTION W/PHACO Left 05/30/2012  ? Procedure: CATARACT EXTRACTION PHACO AND INTRAOCULAR LENS PLACEMENT (IOC);  Surgeon: Tonny Branch, MD;  Location: [facility];  Service: Ophthalmology;  Laterality: Left;  CDE: 16.99  ? CATARACT EXTRACTION W/PHACO Right 06/24/2012  ? Procedure: CATARACT EXTRACTION PHACO AND INTRAOCULAR LENS PLACEMENT (IOC);  Surgeon: Tonny Branch, MD;  Location: [facility];  Service: Ophthalmology;  Laterality: Right;  CDE:17.92  ? COLONOSCOPY WITH PROPOFOL N/A 10/14/2020  ? Procedure: COLONOSCOPY WITH PROPOFOL;  Surgeon: Daneil Dolin, MD;  [facility];  Service: Endoscopy;  Laterality: N/A;  ASA III / 12:30  ? ESOPHAGOGASTRODUODENOSCOPY (EGD) WITH PROPOFOL N/A 10/14/2020  ? Procedure: ESOPHAGOGASTRODUODENOSCOPY (EGD) WITH PROPOFOL;  Surgeon: Daneil Dolin, MD;  [facility];  Service: Endoscopy;  Laterality: N/A;  ? HIP SURGERY    ? JOINT REPLACEMENT  2007  ? RIGHT HIP REPLACEMENT  ? LEFT KNEE ARTHROSCOPY  AUG 2012  ? MALONEY DILATION  10/14/2020  ? Procedure: MALONEY DILATION;  Surgeon: Daneil Dolin, MD;  [facility];  Service: Endoscopy;;  ? MULTIPLE ORTHOPEDIC SURGERIES    ? ON BOTH HANDS AND BOTH FEET, RIGHT ELBOW  ? SPLEENECTOMY  IN THE 70'S  ? FOR LARGE CYST  ? TOTAL KNEE ARTHROPLASTY  11/10/2011  ? Procedure: TOTAL KNEE ARTHROPLASTY;  Surgeon: Mcarthur Rossetti, MD;  [facility]: [facility];  Service: Orthopedics;  Laterality: Left;  Left Total Knee Arthroplasty  ? YAG LASER APPLICATION Bilateral   ? Dr. Geoffry Paradise  ? ? ?SOCIAL HISTORY:  ?Social History  ? ?Socioeconomic History  ? Marital status: Widowed  ?  Spouse name: Not on file  ? Number of children: Not on file  ? Years of education: Not on file  ? Highest education level: Not on file  ?Occupational History  ? Occupation: retired  ?Tobacco Use  ? Smoking status: Every Day  ?  Packs/day: 1.00  ?  Years: 50.00  ?  Pack  years: 50.00  ?  Types: Cigarettes  ? Smokeless tobacco: Never  ?Vaping Use  ? Vaping Use: Never used  ?Substance and Sexual Activity  ? Alcohol use: No  ? Drug use: No  ? Sexual activity: Not Currently  ?  Birth control/protection: Surgical  ?Other Topics Concern  ? Not on file  ?Social History Narrative  ? Not on file  ? ?Social Determinants of Health  ? ?Financial Resource Strain: Not on file  ?Food Insecurity: Not on file  ?Transportation Needs: Not on file  ?Physical Activity: Not on file  ?Stress: Not on file  ?Social Connections: Not on file  ?Intimate Partner Violence: Not on file  ? ? ?FAMILY HISTORY:  ?Family History  ?Problem Relation Age of Onset  ? Diabetes Sister   ? Macular degeneration Sister   ? Heart attack Mother   ? Stroke Father   ? Dementia Father   ? Cancer Brother   ? Healthy Sister   ? Healthy Sister   ? Diabetes Brother   ? ? ?CURRENT MEDICATIONS:  ?Current Outpatient Medications  ?Medication Sig Dispense Refill  ? ALPRAZolam (XANAX) 0.25 MG tablet Take 0.125 mg by mouth at bedtime.    ? Alum & Mag Hydroxide-Simeth (MYLANTA PO) Take by mouth as needed.    ? HYDROcodone-acetaminophen (NORCO/VICODIN) 5-325 MG tablet Take 0.5 tablets by mouth 2 (two) times daily.    ? lactose free nutrition (BOOST) LIQD Take 237 mLs by mouth daily.    ? methocarbamol (ROBAXIN) 500 MG tablet Take 500 mg by mouth 2 (two) times daily.    ? nitrofurantoin, macrocrystal-monohydrate, (MACROBID) 100 MG capsule Take 1 capsule (100 mg total) by mouth 2 (two) times daily. 10 capsule 0  ? ondansetron (ZOFRAN-ODT) 4 MG disintegrating tablet Take 1 tablet (4 mg total) by mouth every 8 (eight) hours as needed for nausea or vomiting. 20 tablet 0  ? Polyvinyl Alcohol-Povidone (REFRESH OP) Place 1 drop into both eyes 4 (four) times daily as needed (dry eyes).    ? prochlorperazine (COMPAZINE) 5 MG tablet Take 1 tablet (5 mg total) by mouth every 6 (six) hours as needed for nausea or vomiting. 30 tablet 0  ? trolamine  salicylate (ASPERCREME) 10 % cream Apply 1 application topically as needed for muscle pain.    ? ?No current facility-administered medications for this visit.  ? ? ?ALLERGIES:  ?Allergies  ?Allergen Reactions  ? Codeine Nausea And Vomiting  ? Prednisone Other (See Comments)  ?  Thought she was having a heart attack  ? Sulfa Antibiotics Other (See Comments)  ?  unknown  ? Aleve [Naproxen] Anxiety  ?  Jittery and hot flashes  ? Latex Rash  ? ? ?PHYSICAL EXAM:  ?Performance status (ECOG): 1 - Symptomatic but completely ambulatory ? ?Vitals:  ? 06/16/21 1014  ?BP: (!) 150/69  ?Pulse: 79  ?Resp: 18  ?Temp: 97.7 ?F (36.5 ?C)  ?SpO2: 100%  ? ?Wt  Readings from Last 3 Encounters:  ?06/16/21 94 lb 5.7 oz (42.8 kg)  ?03/21/21 95 lb 9.6 oz (43.4 kg)  ?03/14/21 96 lb (43.5 kg)  ? ?Physical Exam ?Vitals reviewed.  ?Constitutional:   ?   Appearance: Normal appearance.  ?Cardiovascular:  ?   Rate and Rhythm: Normal rate and regular rhythm.  ?   Pulses: Normal pulses.  ?   Heart sounds: Normal heart sounds.  ?Pulmonary:  ?   Effort: Pulmonary effort is normal.  ?   Breath sounds: Normal breath sounds.  ?Abdominal:  ?   Palpations: Abdomen is soft. There is no hepatomegaly, splenomegaly or mass.  ?   Tenderness: There is no abdominal tenderness.  ?Lymphadenopathy:  ?   Cervical: No cervical adenopathy.  ?   Right cervical: No superficial, deep or posterior cervical adenopathy. ?   Left cervical: No superficial, deep or posterior cervical adenopathy.  ?   Upper Body:  ?   Right upper body: No supraclavicular or axillary adenopathy.  ?   Left upper body: No supraclavicular or axillary adenopathy.  ?   Lower Body: No right inguinal adenopathy. No left inguinal adenopathy.  ?Neurological:  ?   General: No focal deficit present.  ?   Mental Status: She is alert and oriented to person, place, and time.  ?Psychiatric:     ?   Mood and Affect: Mood normal.     ?   Behavior: Behavior normal.  ? ? ?LABORATORY DATA:  ?I have reviewed the labs  as listed.  ? ?  Latest Ref Rng & Units 06/16/2021  ?  9:40 AM 05/31/2021  ? 11:18 AM 03/02/2021  ? 10:47 AM  ?CBC  ?WBC 4.0 - 10.5 K/uL 10.0   11.6   8.5    ?Hemoglobin 12.0 - 15.0 g/dL 13.7   14.3   13.6    ?Hematocrit

## 2021-06-20 DIAGNOSIS — E559 Vitamin D deficiency, unspecified: Secondary | ICD-10-CM | POA: Diagnosis not present

## 2021-06-20 DIAGNOSIS — M2550 Pain in unspecified joint: Secondary | ICD-10-CM | POA: Diagnosis not present

## 2021-06-20 DIAGNOSIS — D72819 Decreased white blood cell count, unspecified: Secondary | ICD-10-CM | POA: Diagnosis not present

## 2021-06-20 DIAGNOSIS — E785 Hyperlipidemia, unspecified: Secondary | ICD-10-CM | POA: Diagnosis not present

## 2021-06-20 DIAGNOSIS — F329 Major depressive disorder, single episode, unspecified: Secondary | ICD-10-CM | POA: Diagnosis not present

## 2021-06-20 DIAGNOSIS — E871 Hypo-osmolality and hyponatremia: Secondary | ICD-10-CM | POA: Diagnosis not present

## 2021-06-20 DIAGNOSIS — E440 Moderate protein-calorie malnutrition: Secondary | ICD-10-CM | POA: Diagnosis not present

## 2021-06-20 DIAGNOSIS — N390 Urinary tract infection, site not specified: Secondary | ICD-10-CM | POA: Diagnosis not present

## 2021-06-20 DIAGNOSIS — C91Z0 Other lymphoid leukemia not having achieved remission: Secondary | ICD-10-CM | POA: Diagnosis not present

## 2021-08-26 DIAGNOSIS — E785 Hyperlipidemia, unspecified: Secondary | ICD-10-CM | POA: Diagnosis not present

## 2021-08-26 DIAGNOSIS — K219 Gastro-esophageal reflux disease without esophagitis: Secondary | ICD-10-CM | POA: Diagnosis not present

## 2021-09-20 DIAGNOSIS — C91Z0 Other lymphoid leukemia not having achieved remission: Secondary | ICD-10-CM | POA: Diagnosis not present

## 2021-09-20 DIAGNOSIS — E440 Moderate protein-calorie malnutrition: Secondary | ICD-10-CM | POA: Diagnosis not present

## 2021-09-20 DIAGNOSIS — M2550 Pain in unspecified joint: Secondary | ICD-10-CM | POA: Diagnosis not present

## 2021-09-20 DIAGNOSIS — D709 Neutropenia, unspecified: Secondary | ICD-10-CM | POA: Diagnosis not present

## 2021-09-20 DIAGNOSIS — R1013 Epigastric pain: Secondary | ICD-10-CM | POA: Diagnosis not present

## 2021-09-20 DIAGNOSIS — F329 Major depressive disorder, single episode, unspecified: Secondary | ICD-10-CM | POA: Diagnosis not present

## 2021-09-20 DIAGNOSIS — D72819 Decreased white blood cell count, unspecified: Secondary | ICD-10-CM | POA: Diagnosis not present

## 2021-09-20 DIAGNOSIS — Z681 Body mass index (BMI) 19 or less, adult: Secondary | ICD-10-CM | POA: Diagnosis not present

## 2021-12-10 IMAGING — CT CT ABD-PELV W/ CM
3 of 6 series · 16 of 46 positions shown, 18 images · IV contrast (Omnipaque or Isovue)
Comparison: CT abdomen pelvis 11/06/2019, CT chest 12/01/2004

CLINICAL DATA: [place] GI bleeding and epigastric pain. Blood in
stool x 2 days. Hx of leukemia, hiatal hernia, hysterectomy,
splenectomy. No oral per provider. Denies pain, N/V/D.

EXAM:
CT ABDOMEN AND PELVIS WITH CONTRAST
TECHNIQUE: Multidetector CT imaging of the abdomen and pelvis was performed
using the standard protocol following bolus administration of
intravenous contrast.
CONTRAST:  75mL OMNIPAQUE IOHEXOL 300 MG/ML  SOLN

[Series 2: axial st · axial · 0.70mm/px · z∈[-740,-440]mm · 9 of 76 slices shown, 11 images]
[im 8/76  soft-tissue]
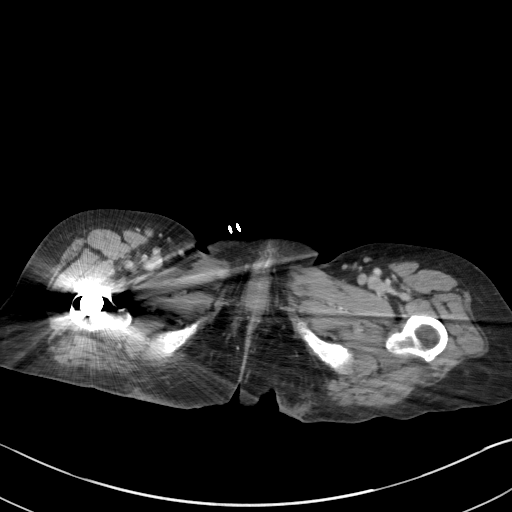
[im 8/76  bone]
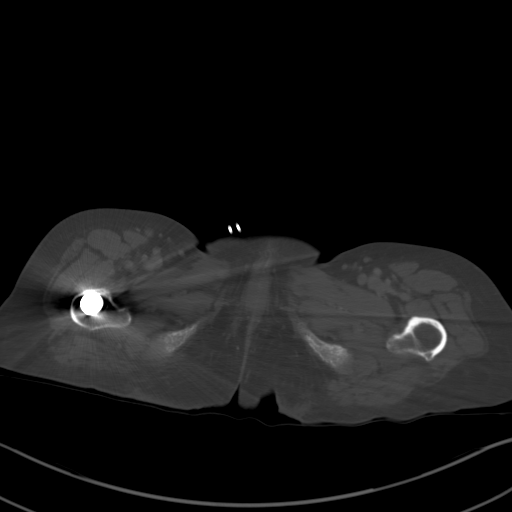
[im 16/76  soft-tissue]
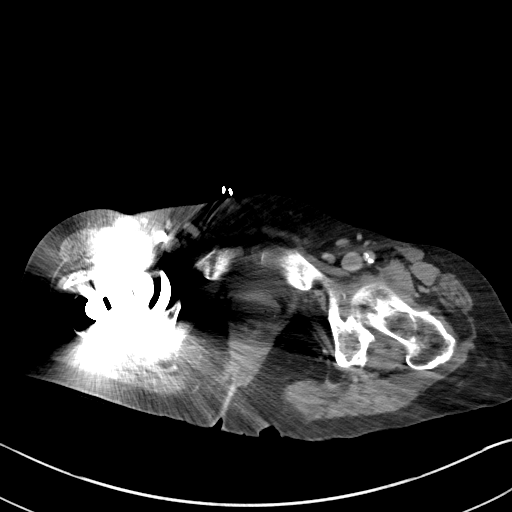
[im 23/76  soft-tissue]
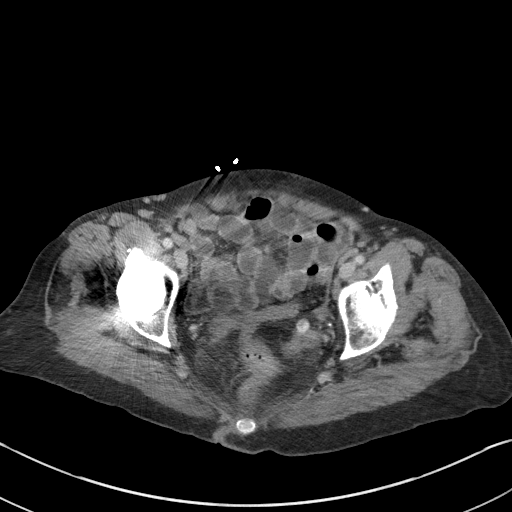
[im 31/76  soft-tissue]
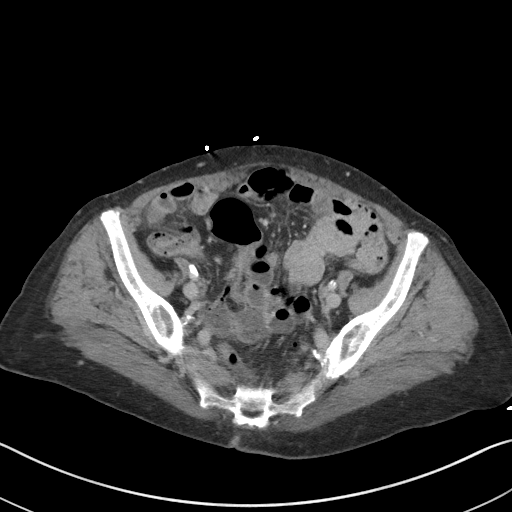
[im 38/76  soft-tissue]
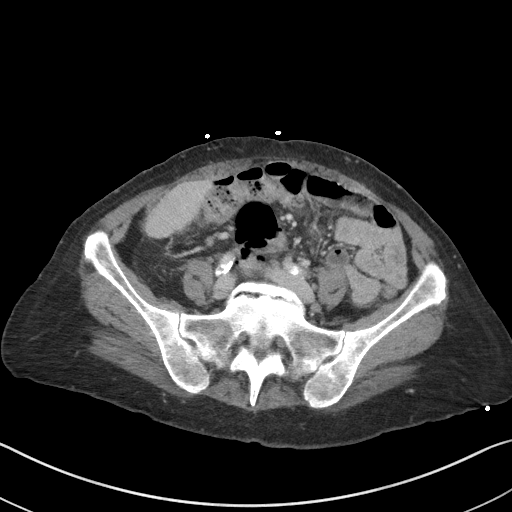
[im 46/76  soft-tissue]
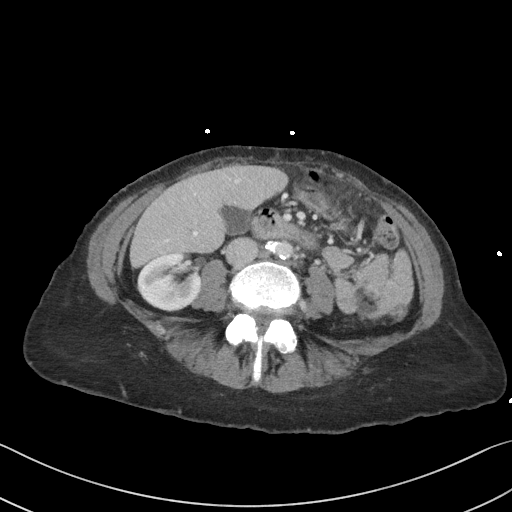
[im 53/76  soft-tissue]
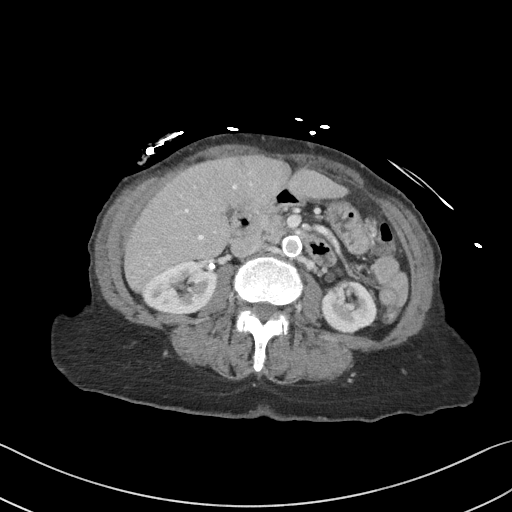
[im 61/76  soft-tissue]
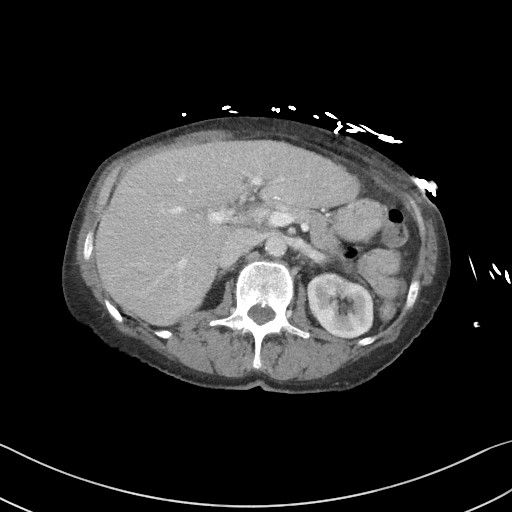
[im 68/76  soft-tissue]
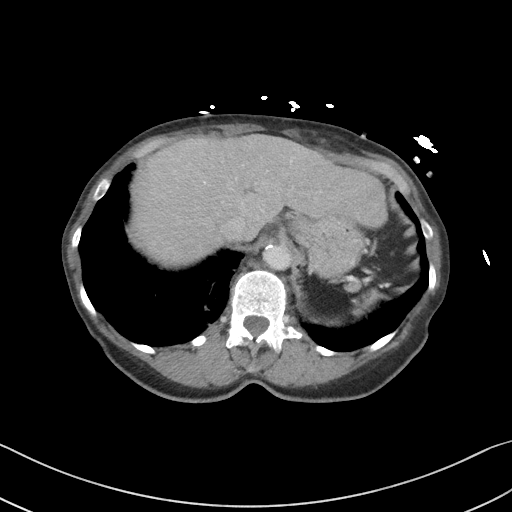
[im 68/76  bone]
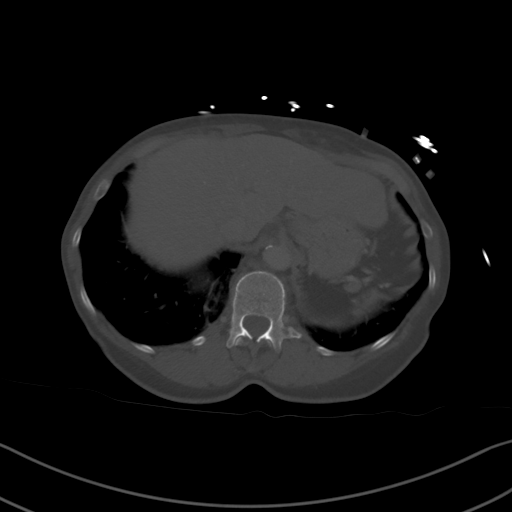

[Series 5: coronal st · coronal · 0.75mm/px · 3 of 86 slices shown]
[im 29/86  soft-tissue]
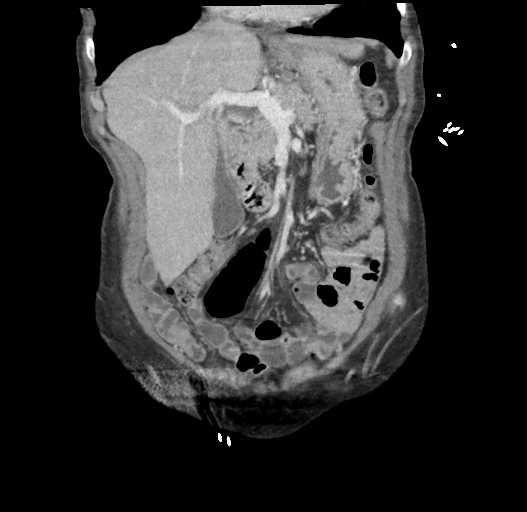
[im 38/86  soft-tissue]
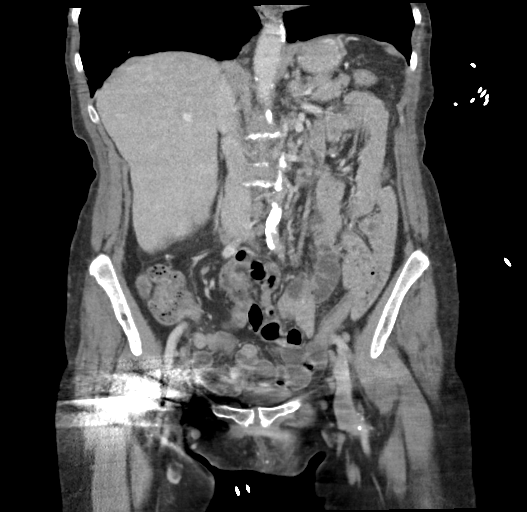
[im 48/86  soft-tissue]
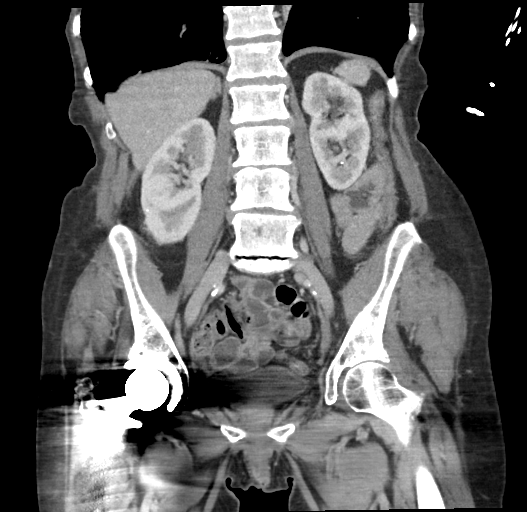

[Series 8: axial (person_name) (person_name) · axial · 0.70mm/px · z∈[-740,-625]mm · 4 of 76 slices shown]
[im 8/76  soft-tissue]
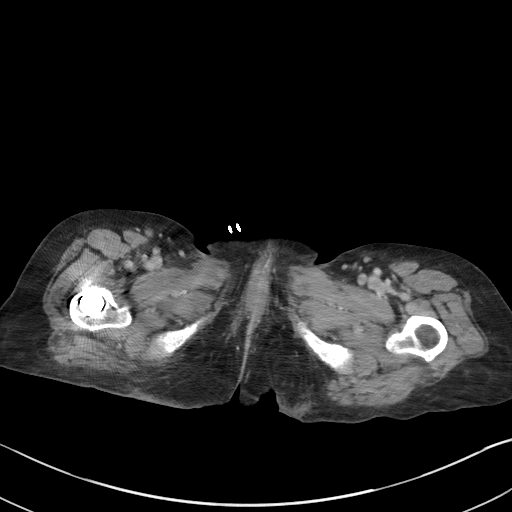
[im 16/76  soft-tissue]
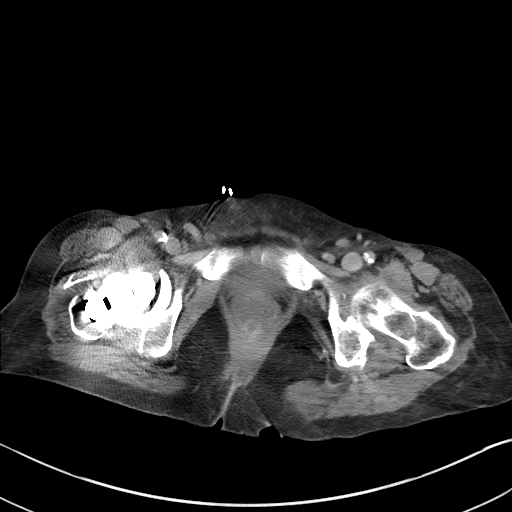
[im 23/76  soft-tissue]
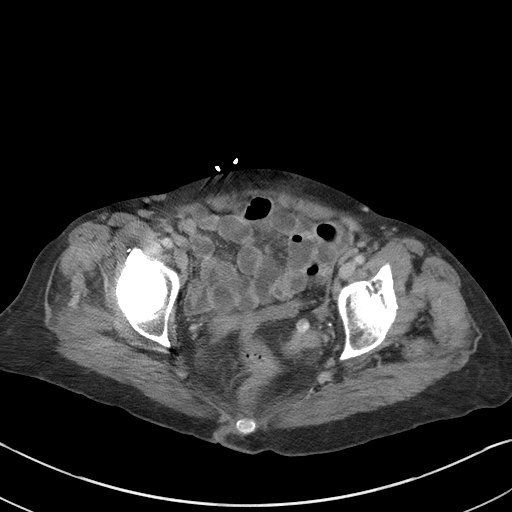
[im 31/76  soft-tissue]
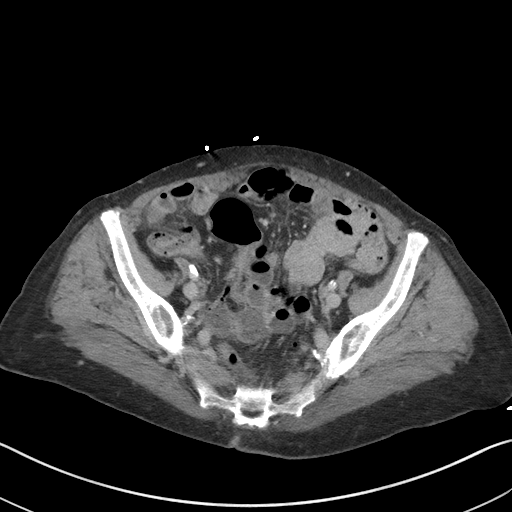

[16 of 46 positions shown; findings below may reference images not displayed]

FINDINGS: Lower chest: No acute abnormality.

Hepatobiliary: Stable 1 cm per peripheral left hepatic lobe lesion
that is too small to characterize. No focal liver abnormality. No
gallstones, gallbladder wall thickening, or pericholecystic fluid.
No biliary dilatation.

Pancreas: No focal lesion. Normal pancreatic contour. No surrounding
inflammatory changes. No main pancreatic ductal dilatation.

Spleen: Status post splenectomy. Left upper quadrant 3 cm soft
tissue density suggestive of splenosis.

Adrenals/Urinary Tract:

No adrenal nodule bilaterally.

Bilateral kidneys enhance symmetrically. No hydronephrosis. No
hydroureter.

The urinary bladder is unremarkable.

Stomach/Bowel: Stomach is within normal limits. No evidence of bowel
wall thickening or dilatation. Mobile medialized cecum. Scattered
colonic diverticulosis. Appendix not definitely identified with no
evidence of inflammatory changes to suggest acute appendicitis.

Vascular/Lymphatic: No abdominal aorta or iliac aneurysm. Severe
atherosclerotic plaque of the aorta and its branches. No abdominal,
pelvic, or inguinal lymphadenopathy.

Reproductive: Limited evaluation of the pelvis due to streak
artifact originating from a right hip arthroplasty. Status post
hysterectomy. No adnexal masses.

Other: No intraperitoneal free fluid. No intraperitoneal free gas.
No organized fluid collection.

Musculoskeletal:

No abdominal wall hernia or abnormality.

No suspicious lytic or blastic osseous lesions. No acute displaced
fracture. Multilevel degenerative changes of the spine. Status post
partially visualized total right hip arthroplasty.
IMPRESSION: 1. No acute intra-abdominal or intrapelvic abnormality on this
single phase portal venous study.
2. Scattered colonic diverticulosis with no acute diverticulitis.
3. Similar in size indeterminate 1 cm left hepatic lesion. Given
chronicity, finding likely benign. Findings suggestive of left upper
quadrant splenosis in a patient status post splenectomy.
4.  Aortic Atherosclerosis ([id number].S).

## 2021-12-16 DIAGNOSIS — L739 Follicular disorder, unspecified: Secondary | ICD-10-CM | POA: Diagnosis not present

## 2021-12-19 ENCOUNTER — Ambulatory Visit (HOSPITAL_COMMUNITY): Payer: Medicare HMO | Admitting: Hematology

## 2021-12-19 ENCOUNTER — Other Ambulatory Visit (HOSPITAL_COMMUNITY): Payer: Medicare HMO

## 2021-12-26 ENCOUNTER — Ambulatory Visit (HOSPITAL_COMMUNITY): Payer: Medicare HMO | Admitting: Hematology

## 2021-12-26 ENCOUNTER — Other Ambulatory Visit (HOSPITAL_COMMUNITY): Payer: Medicare HMO

## 2021-12-27 ENCOUNTER — Inpatient Hospital Stay: Payer: Medicare HMO | Attending: Hematology | Admitting: Hematology

## 2021-12-27 ENCOUNTER — Inpatient Hospital Stay: Payer: Medicare HMO

## 2021-12-27 VITALS — BP 165/66 | HR 83 | Temp 98.0°F | Resp 18 | Ht 61.0 in | Wt 96.5 lb

## 2021-12-27 DIAGNOSIS — Z79899 Other long term (current) drug therapy: Secondary | ICD-10-CM | POA: Diagnosis not present

## 2021-12-27 DIAGNOSIS — Z809 Family history of malignant neoplasm, unspecified: Secondary | ICD-10-CM | POA: Diagnosis not present

## 2021-12-27 DIAGNOSIS — D72829 Elevated white blood cell count, unspecified: Secondary | ICD-10-CM | POA: Insufficient documentation

## 2021-12-27 DIAGNOSIS — C91Z0 Other lymphoid leukemia not having achieved remission: Secondary | ICD-10-CM | POA: Diagnosis not present

## 2021-12-27 DIAGNOSIS — M069 Rheumatoid arthritis, unspecified: Secondary | ICD-10-CM | POA: Diagnosis not present

## 2021-12-27 DIAGNOSIS — K219 Gastro-esophageal reflux disease without esophagitis: Secondary | ICD-10-CM | POA: Diagnosis not present

## 2021-12-27 DIAGNOSIS — E785 Hyperlipidemia, unspecified: Secondary | ICD-10-CM | POA: Insufficient documentation

## 2021-12-27 DIAGNOSIS — D72820 Lymphocytosis (symptomatic): Secondary | ICD-10-CM | POA: Diagnosis present

## 2021-12-27 DIAGNOSIS — D709 Neutropenia, unspecified: Secondary | ICD-10-CM | POA: Diagnosis not present

## 2021-12-27 DIAGNOSIS — F17210 Nicotine dependence, cigarettes, uncomplicated: Secondary | ICD-10-CM | POA: Diagnosis not present

## 2021-12-27 DIAGNOSIS — E559 Vitamin D deficiency, unspecified: Secondary | ICD-10-CM | POA: Insufficient documentation

## 2021-12-27 DIAGNOSIS — M129 Arthropathy, unspecified: Secondary | ICD-10-CM | POA: Insufficient documentation

## 2021-12-27 DIAGNOSIS — R509 Fever, unspecified: Secondary | ICD-10-CM | POA: Diagnosis not present

## 2021-12-27 LAB — CBC WITH DIFFERENTIAL/PLATELET
Abs Immature Granulocytes: 0 10*3/uL (ref 0.00–0.07)
Basophils Absolute: 0 10*3/uL (ref 0.0–0.1)
Basophils Relative: 0 %
Eosinophils Absolute: 0 10*3/uL (ref 0.0–0.5)
Eosinophils Relative: 0 %
HCT: 40.9 % (ref 36.0–46.0)
Hemoglobin: 14 g/dL (ref 12.0–15.0)
Lymphocytes Relative: 98 %
Lymphs Abs: 8.7 10*3/uL — ABNORMAL HIGH (ref 0.7–4.0)
MCH: 34.1 pg — ABNORMAL HIGH (ref 26.0–34.0)
MCHC: 34.2 g/dL (ref 30.0–36.0)
MCV: 99.5 fL (ref 80.0–100.0)
Monocytes Absolute: 0 10*3/uL — ABNORMAL LOW (ref 0.1–1.0)
Monocytes Relative: 0 %
Neutro Abs: 0.2 10*3/uL — CL (ref 1.7–7.7)
Neutrophils Relative %: 2 %
Platelets: 241 10*3/uL (ref 150–400)
RBC: 4.11 MIL/uL (ref 3.87–5.11)
RDW: 16.5 % — ABNORMAL HIGH (ref 11.5–15.5)
WBC: 8.9 10*3/uL (ref 4.0–10.5)
nRBC: 0.2 % (ref 0.0–0.2)

## 2021-12-27 LAB — LACTATE DEHYDROGENASE: LDH: 143 U/L (ref 98–192)

## 2021-12-27 LAB — COMPREHENSIVE METABOLIC PANEL
ALT: 18 U/L (ref 0–44)
AST: 20 U/L (ref 15–41)
Albumin: 3.4 g/dL — ABNORMAL LOW (ref 3.5–5.0)
Alkaline Phosphatase: 103 U/L (ref 38–126)
Anion gap: 7 (ref 5–15)
BUN: 13 mg/dL (ref 8–23)
CO2: 25 mmol/L (ref 22–32)
Calcium: 9.1 mg/dL (ref 8.9–10.3)
Chloride: 99 mmol/L (ref 98–111)
Creatinine, Ser: 0.6 mg/dL (ref 0.44–1.00)
GFR, Estimated: >60 mL/min (ref >60)
Glucose, Bld: 100 mg/dL — ABNORMAL HIGH (ref 70–99)
Potassium: 4.4 mmol/L (ref 3.5–5.1)
Sodium: 131 mmol/L — ABNORMAL LOW (ref 135–145)
Total Bilirubin: 0.5 mg/dL (ref 0.3–1.2)
Total Protein: 8.3 g/dL — ABNORMAL HIGH (ref 6.5–8.1)

## 2021-12-27 NOTE — Progress Notes (Unsigned)
CRITICAL VALUE ALERT Critical value received:  ANC 0.2 Date of notification:  12-27-21 Time of notification: 1150 Critical value read back:  Yes.   Nurse who received alert:  C. Jenee Spaugh RN MD notified time and response:  [id number], Dr.  Delton Coombes, no new orders at this time

## 2021-12-27 NOTE — Patient Instructions (Signed)
[place] at [facility] Discharge Instructions   You were seen and examined today by Dr. Delton Coombes.  He reviewed the results of your lab work which are stable. Your neutrophil count remain extremely low.   We will see you back in 6 months. We will repeat blood work at that time.    Thank you for choosing [place] at [facility] to provide your oncology and hematology care.  To afford each patient quality time with our provider, please arrive at least 15 minutes before your scheduled appointment time.   If you have a lab appointment with the [facility] please come in thru the Main Entrance and check in at the main information desk.  You need to re-schedule your appointment should you arrive 10 or more minutes late.  We strive to give you quality time with our providers, and arriving late affects you and other patients whose appointments are after yours.  Also, if you no show three or more times for appointments you may be dismissed from the clinic at the providers discretion.     Again, thank you for choosing [facility].  Our hope is that these requests will decrease the amount of time that you wait before being seen by our physicians.       _____________________________________________________________  Should you have questions after your visit to [facility], please contact our office at [phone] and follow the prompts.  Our office hours are 8:00 a.m. and 4:30 p.m. Monday - Friday.  Please note that voicemails left after 4:00 p.m. may not be returned until the following business day.  We are closed weekends and major holidays.  You do have access to a nurse 24-7, just call the main number to the clinic [phone] and do not press any options, hold on the line and a nurse will answer the phone.    For prescription refill requests, have your pharmacy contact our office and allow 72 hours.    Due to Covid,  you will need to wear a mask upon entering the hospital. If you do not have a mask, a mask will be given to you at the Main Entrance upon arrival. For doctor visits, patients may have 1 support person age 73 or older with them. For treatment visits, patients can not have anyone with them due to social distancing guidelines and our immunocompromised population.

## 2021-12-27 NOTE — Progress Notes (Signed)
[place], [place] [address]   CLINIC:  Medical Oncology/Hematology  PCP:  Celene Squibb, MD [address] [place] [address]  [phone]  REASON FOR VISIT:  Follow-up for lymphocytosis and neutropenia  PRIOR THERAPY: Splenectomy in 1970's  CURRENT THERAPY: surveillance  INTERVAL HISTORY:  [name], a 81 y.o. 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  Reports arthritic pain 7 out of 10 intensity.  Recently had a staph infection and was treated with antibiotics by Dr. Nevada Crane.  No other infections reported.  No weight loss or night sweats.  She reported low-grade evening fevers of 99 degrees.  REVIEW OF SYSTEMS:  Review of Systems  Constitutional:  Negative for appetite change.  Respiratory:  Positive for cough and shortness of breath.   Gastrointestinal:  Positive for nausea.  Musculoskeletal:  Positive for arthralgias (6/10).  Psychiatric/Behavioral:  Positive for depression. The patient is nervous/anxious.   All other systems reviewed and are negative.   PAST MEDICAL/SURGICAL HISTORY:  Past Medical History:  Diagnosis Date   Arthritis    RA AND OA --PAIN AND SWELLIN IN LEFT KNEE   Blood in urine    NEGATIVE UROLOGY WORK UP --BUT ALWAYS HAS BLOOD IN URINE   GERD (gastroesophageal reflux disease)    H/O hiatal hernia    Hyperlipidemia    Leukemia (HCC)    PONV (postoperative nausea and vomiting)    ALWAYS SICK AFTER SURGERIES EXCEPT AFTER HIP REPLACMENT 2007   Vitamin D deficiency    Past Surgical History:  Procedure Laterality Date   ABDOMINAL HYSTERECTOMY  1870'S   BREAST SURGERY     BREAST BIOSPIES   CATARACT EXTRACTION W/PHACO Left 05/30/2012   Procedure: CATARACT EXTRACTION PHACO AND INTRAOCULAR LENS PLACEMENT ([place]);  Surgeon: Tonny Branch, MD;  Location: [facility];  Service: Ophthalmology;  Laterality: Left;  CDE: 16.99   CATARACT EXTRACTION W/PHACO Right 06/24/2012   Procedure:  CATARACT EXTRACTION PHACO AND INTRAOCULAR LENS PLACEMENT ([facility]);  Surgeon: Tonny Branch, MD;  Location: [facility];  Service: Ophthalmology;  Laterality: Right;  CDE:17.92   COLONOSCOPY WITH PROPOFOL N/A 10/14/2020   Procedure: COLONOSCOPY WITH PROPOFOL;  Surgeon: Daneil Dolin, MD;  Location: [facility];  Service: Endoscopy;  Laterality: N/A;  ASA III / 12:30   ESOPHAGOGASTRODUODENOSCOPY (EGD) WITH PROPOFOL N/A 10/14/2020   Procedure: ESOPHAGOGASTRODUODENOSCOPY (EGD) WITH PROPOFOL;  Surgeon: Daneil Dolin, MD;  Location: [facility];  Service: Endoscopy;  Laterality: N/A;   HIP SURGERY     JOINT REPLACEMENT  2007   RIGHT HIP REPLACEMENT   LEFT KNEE ARTHROSCOPY  AUG 2012   MALONEY DILATION  10/14/2020   Procedure: MALONEY DILATION;  Surgeon: Daneil Dolin, MD;  Location: [facility];  Service: Endoscopy;;   MULTIPLE ORTHOPEDIC SURGERIES     ON BOTH HANDS AND BOTH FEET, RIGHT ELBOW   SPLEENECTOMY  IN THE 70'S   FOR LARGE CYST   TOTAL KNEE ARTHROPLASTY  11/10/2011   Procedure: TOTAL KNEE ARTHROPLASTY;  Surgeon: Mcarthur Rossetti, MD;  Location: [facility];  Service: Orthopedics;  Laterality: Left;  Left Total Knee Arthroplasty   YAG LASER APPLICATION Bilateral    Dr. Geoffry Paradise    SOCIAL HISTORY:  Social History   Socioeconomic History   Marital status: Widowed    Spouse name: Not on file   Number of children: Not on file   Years of education: Not on file  Highest education level: Not on file  Occupational History   Occupation: retired  Tobacco Use   Smoking status: Every Day    Packs/day: 1.00    Years: 50.00    Total pack years: 50.00    Types: Cigarettes   Smokeless tobacco: Never  Vaping Use   Vaping Use: Never used  Substance and Sexual Activity   Alcohol use: No   Drug use: No   Sexual activity: Not Currently    Birth control/protection: Surgical  Other Topics Concern   Not on file  Social History Narrative   Not on file   Social Determinants of Health    Financial Resource Strain: Not on file  Food Insecurity: Not on file  Transportation Needs: Not on file  Physical Activity: Not on file  Stress: Not on file  Social Connections: Not on file  Intimate Partner Violence: Not on file    FAMILY HISTORY:  Family History  Problem Relation Age of Onset   Diabetes Sister    Macular degeneration Sister    Heart attack Mother    Stroke Father    Dementia Father    Cancer Brother    Healthy Sister    Healthy Sister    Diabetes Brother     CURRENT MEDICATIONS:  Current Outpatient Medications  Medication Sig Dispense Refill   ALPRAZolam (XANAX) 0.25 MG tablet Take 0.125 mg by mouth at bedtime.     Alum & Mag Hydroxide-Simeth (MYLANTA PO) Take by mouth as needed.     HYDROcodone-acetaminophen (NORCO/VICODIN) 5-325 MG tablet Take 0.5 tablets by mouth 2 (two) times daily.     lactose free nutrition (BOOST) LIQD Take 237 mLs by mouth daily.     methocarbamol (ROBAXIN) 500 MG tablet Take 500 mg by mouth 2 (two) times daily.     ondansetron (ZOFRAN-ODT) 4 MG disintegrating tablet Take 1 tablet (4 mg total) by mouth every 8 (eight) hours as needed for nausea or vomiting. 20 tablet 0   Polyvinyl Alcohol-Povidone (REFRESH OP) Place 1 drop into both eyes 4 (four) times daily as needed (dry eyes).     prochlorperazine (COMPAZINE) 5 MG tablet Take 1 tablet (5 mg total) by mouth every 6 (six) hours as needed for nausea or vomiting. 30 tablet 0   trolamine salicylate (ASPERCREME) 10 % cream Apply 1 application topically as needed for muscle pain.     No current facility-administered medications for this visit.    ALLERGIES:  Allergies  Allergen Reactions   Codeine Nausea And Vomiting   Prednisone Other (See Comments)    Thought she was having a heart attack   Sulfa Antibiotics Other (See Comments)    unknown   Aleve [Naproxen] Anxiety    Jittery and hot flashes   Latex Rash    PHYSICAL EXAM:  Performance status (ECOG): 1 - Symptomatic  but completely ambulatory  Vitals:   12/27/21 1149  BP: (!) 165/66  Pulse: 83  Resp: 18  Temp: 98 F (36.7 C)  SpO2: 95%   Wt Readings from Last 3 Encounters:  12/27/21 96 lb 8 oz (43.8 kg)  06/16/21 94 lb 5.7 oz (42.8 kg)  03/21/21 95 lb 9.6 oz (43.4 kg)   Physical Exam Vitals reviewed.  Constitutional:      Appearance: Normal appearance.  Cardiovascular:     Rate and Rhythm: Normal rate and regular rhythm.     Pulses: Normal pulses.     Heart sounds: Normal heart sounds.  Pulmonary:  Effort: Pulmonary effort is normal.     Breath sounds: Normal breath sounds.  Abdominal:     Palpations: Abdomen is soft. There is no hepatomegaly, splenomegaly or mass.     Tenderness: There is no abdominal tenderness.  Lymphadenopathy:     Cervical: No cervical adenopathy.     Right cervical: No superficial, deep or posterior cervical adenopathy.    Left cervical: No superficial, deep or posterior cervical adenopathy.     Upper Body:     Right upper body: No supraclavicular or axillary adenopathy.     Left upper body: No supraclavicular or axillary adenopathy.     Lower Body: No right inguinal adenopathy. No left inguinal adenopathy.  Neurological:     General: No focal deficit present.     Mental Status: She is alert and oriented to person, place, and time.  Psychiatric:        Mood and Affect: Mood normal.        Behavior: Behavior normal.     LABORATORY DATA:  I have reviewed the labs as listed.     Latest Ref Rng & Units 12/27/2021   10:58 AM 06/16/2021    9:40 AM 05/31/2021   11:18 AM  CBC  WBC 4.0 - 10.5 K/uL 8.9  10.0  11.6   Hemoglobin 12.0 - 15.0 g/dL 14.0  13.7  14.3   Hematocrit 36.0 - 46.0 % 40.9  41.9  40.5   Platelets 150 - 400 K/uL 241  313  287       Latest Ref Rng & Units 12/27/2021   10:58 AM 06/16/2021    9:40 AM 05/31/2021   11:18 AM  CMP  Glucose 70 - 99 mg/dL 100  106  110   BUN 8 - 23 mg/dL '13  13  12   '$ Creatinine 0.44 - 1.00 mg/dL 0.60  0.61   0.58   Sodium 135 - 145 mmol/L 131  131  127   Potassium 3.5 - 5.1 mmol/L 4.4  4.3  4.4   Chloride 98 - 111 mmol/L 99  99  94   CO2 22 - 32 mmol/L '25  26  20   '$ Calcium 8.9 - 10.3 mg/dL 9.1  9.1  9.3   Total Protein 6.5 - 8.1 g/dL 8.3  8.2  8.3   Total Bilirubin 0.3 - 1.2 mg/dL 0.5  0.3  0.3   Alkaline Phos 38 - 126 U/L 103  97  116   AST 15 - 41 U/L '20  24  16   '$ ALT 0 - 44 U/L '18  20  11       '$ Component Value Date/Time   RBC 4.11 12/27/2021 1058   MCV 99.5 12/27/2021 1058   MCV 94 05/31/2021 1118   MCH 34.1 (H) 12/27/2021 1058   MCHC 34.2 12/27/2021 1058   RDW 16.5 (H) 12/27/2021 1058   RDW 14.7 05/31/2021 1118   LYMPHSABS 8.7 (H) 12/27/2021 1058   LYMPHSABS 10.6 (H) 05/31/2021 1118   MONOABS 0.0 (L) 12/27/2021 1058   EOSABS 0.0 12/27/2021 1058   EOSABS 0.0 05/31/2021 1118   BASOSABS 0.0 12/27/2021 1058   BASOSABS 0.0 05/31/2021 1118    DIAGNOSTIC IMAGING:  I have independently reviewed the scans and discussed with the patient. No results found.   ASSESSMENT:  1.  Lymphocytic leukocytosis: -CBC on 10-13-2019 shows white count 13.5, with differential showing 2% neutrophils, 94% lymphocytes, 3% monocytes and 1% basophils. -Hemoglobin and platelet count were normal. -She had  history of splenectomy at age 62 secondary to a cyst on it. -She reported 5 to 10 pound weight loss in the last 6 months but denies any fevers or night sweats. -Reports decreased appetite and decreased energy levels for the last 5 to 6 weeks. -CT CAP on 11/06/2019 showed a borderline prominent right axillary lymph node, 1.4 cm right upper normal-sized left axillary and right subpectoral lymph nodes with no overtly pathologic adenopathy.  Mild chronic diffuse prominence of thyroid gland without discrete nodule. - T-cell receptor gene rearrangement showed clonal T-cell gamma population as well as clonal T-cell beta population detected.  This usually reflects presence of T-cell lymphocytic neoplasm. - Bone  marrow biopsy on 08/11/2020 shows normocellular bone marrow for age, moderate involvement by interstitial T-cell infiltrates.  This favors large granular lymphocytic leukemia. 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  This can be detected in 30 to 40% cases of T-cell LGL leukemia.  Polytypic plasma cells about 10% were seen.  Chromosome analysis was 54, XX.   2.  Social/family history: -She lives at home with her nephew.  She did office work prior to retirement. -She is a current active smoker, 1 pack/day for 50+ years. -Brother had cancer, type unknown.   3.  Rheumatoid arthritis: -She has rheumatoid arthritis of the extremities, predominantly in the upper extremities with rheumatoid nodules. -Uses Voltaren and aspirin cream.   PLAN:  1.  Severe neutropenia from Large granular lymphocytic leukemia: - CTAP on 06/02/2021 with no evidence of abdominal or pelvic adenopathy. - Had 1 episode of skin infection with staph and was treated with antibiotics from last visit.  No other infections. - No palpable adenopathy. - Labs show normal LFTs and creatinine.  CBC was grossly normal with the differential showing severe neutropenia. - She refused treatment with methotrexate and prednisone in the past. - Counts are stable.  RTC 6 months for follow-up with repeat labs.   2.  Weight loss: - Weight is stable from last visit.  Continue boost and Ensure daily.  3.  Rheumatoid arthritis: - She has rheumatoid arthritis involving upper and lower extremities. - Continue hydrocodone and methocarbamol as needed.  Orders placed this encounter:  No orders of the defined types were placed in this encounter.    Derek Jack, MD [place] [phone]

## 2022-01-10 DIAGNOSIS — D72819 Decreased white blood cell count, unspecified: Secondary | ICD-10-CM | POA: Diagnosis not present

## 2022-01-10 DIAGNOSIS — E785 Hyperlipidemia, unspecified: Secondary | ICD-10-CM | POA: Diagnosis not present

## 2022-01-10 DIAGNOSIS — I69359 Hemiplegia and hemiparesis following cerebral infarction affecting unspecified side: Secondary | ICD-10-CM | POA: Diagnosis not present

## 2022-01-10 DIAGNOSIS — E440 Moderate protein-calorie malnutrition: Secondary | ICD-10-CM | POA: Diagnosis not present

## 2022-01-10 DIAGNOSIS — E559 Vitamin D deficiency, unspecified: Secondary | ICD-10-CM | POA: Diagnosis not present

## 2022-01-10 DIAGNOSIS — K5730 Diverticulosis of large intestine without perforation or abscess without bleeding: Secondary | ICD-10-CM | POA: Diagnosis not present

## 2022-01-10 DIAGNOSIS — I700 Atherosclerosis of aorta: Secondary | ICD-10-CM | POA: Diagnosis not present

## 2022-01-10 DIAGNOSIS — F329 Major depressive disorder, single episode, unspecified: Secondary | ICD-10-CM | POA: Diagnosis not present

## 2022-01-10 DIAGNOSIS — C91Z0 Other lymphoid leukemia not having achieved remission: Secondary | ICD-10-CM | POA: Diagnosis not present

## 2022-01-10 DIAGNOSIS — E871 Hypo-osmolality and hyponatremia: Secondary | ICD-10-CM | POA: Diagnosis not present

## 2022-01-10 DIAGNOSIS — M2550 Pain in unspecified joint: Secondary | ICD-10-CM | POA: Diagnosis not present

## 2022-03-08 DIAGNOSIS — H35033 Hypertensive retinopathy, bilateral: Secondary | ICD-10-CM | POA: Diagnosis not present

## 2022-03-08 DIAGNOSIS — H43813 Vitreous degeneration, bilateral: Secondary | ICD-10-CM | POA: Diagnosis not present

## 2022-03-13 ENCOUNTER — Observation Stay (HOSPITAL_COMMUNITY): Payer: Medicare HMO

## 2022-03-13 ENCOUNTER — Inpatient Hospital Stay (HOSPITAL_COMMUNITY)
Admission: EM | Admit: 2022-03-13 | Discharge: 2022-03-20 | DRG: 065 | Disposition: A | Discharge status: Rehabilitation Facility | Payer: Medicare HMO | Attending: Family Medicine | Admitting: Family Medicine

## 2022-03-13 ENCOUNTER — Other Ambulatory Visit: Payer: Self-pay

## 2022-03-13 ENCOUNTER — Emergency Department (HOSPITAL_COMMUNITY): Payer: Medicare HMO

## 2022-03-13 ENCOUNTER — Encounter (HOSPITAL_COMMUNITY): Payer: Self-pay | Admitting: Emergency Medicine

## 2022-03-13 DIAGNOSIS — M1990 Unspecified osteoarthritis, unspecified site: Secondary | ICD-10-CM | POA: Diagnosis present

## 2022-03-13 DIAGNOSIS — Z882 Allergy status to sulfonamides status: Secondary | ICD-10-CM | POA: Diagnosis not present

## 2022-03-13 DIAGNOSIS — I69351 Hemiplegia and hemiparesis following cerebral infarction affecting right dominant side: Secondary | ICD-10-CM | POA: Diagnosis not present

## 2022-03-13 DIAGNOSIS — D708 Other neutropenia: Secondary | ICD-10-CM | POA: Diagnosis present

## 2022-03-13 DIAGNOSIS — K219 Gastro-esophageal reflux disease without esophagitis: Secondary | ICD-10-CM | POA: Diagnosis present

## 2022-03-13 DIAGNOSIS — E049 Nontoxic goiter, unspecified: Secondary | ICD-10-CM | POA: Diagnosis present

## 2022-03-13 DIAGNOSIS — C9590 Leukemia, unspecified not having achieved remission: Secondary | ICD-10-CM | POA: Diagnosis present

## 2022-03-13 DIAGNOSIS — I63233 Cerebral infarction due to unspecified occlusion or stenosis of bilateral carotid arteries: Secondary | ICD-10-CM | POA: Diagnosis not present

## 2022-03-13 DIAGNOSIS — Z885 Allergy status to narcotic agent status: Secondary | ICD-10-CM

## 2022-03-13 DIAGNOSIS — G4700 Insomnia, unspecified: Secondary | ICD-10-CM

## 2022-03-13 DIAGNOSIS — Z66 Do not resuscitate: Secondary | ICD-10-CM | POA: Diagnosis present

## 2022-03-13 DIAGNOSIS — F17210 Nicotine dependence, cigarettes, uncomplicated: Secondary | ICD-10-CM | POA: Diagnosis present

## 2022-03-13 DIAGNOSIS — Z8249 Family history of ischemic heart disease and other diseases of the circulatory system: Secondary | ICD-10-CM | POA: Diagnosis not present

## 2022-03-13 DIAGNOSIS — F419 Anxiety disorder, unspecified: Secondary | ICD-10-CM

## 2022-03-13 DIAGNOSIS — E785 Hyperlipidemia, unspecified: Secondary | ICD-10-CM | POA: Diagnosis present

## 2022-03-13 DIAGNOSIS — I6381 Other cerebral infarction due to occlusion or stenosis of small artery: Principal | ICD-10-CM | POA: Diagnosis present

## 2022-03-13 DIAGNOSIS — R0689 Other abnormalities of breathing: Secondary | ICD-10-CM | POA: Diagnosis not present

## 2022-03-13 DIAGNOSIS — Z720 Tobacco use: Secondary | ICD-10-CM | POA: Diagnosis not present

## 2022-03-13 DIAGNOSIS — R531 Weakness: Principal | ICD-10-CM

## 2022-03-13 DIAGNOSIS — Z96652 Presence of left artificial knee joint: Secondary | ICD-10-CM | POA: Diagnosis present

## 2022-03-13 DIAGNOSIS — N390 Urinary tract infection, site not specified: Secondary | ICD-10-CM | POA: Diagnosis not present

## 2022-03-13 DIAGNOSIS — M159 Polyosteoarthritis, unspecified: Secondary | ICD-10-CM

## 2022-03-13 DIAGNOSIS — G8191 Hemiplegia, unspecified affecting right dominant side: Secondary | ICD-10-CM | POA: Diagnosis present

## 2022-03-13 DIAGNOSIS — Z79891 Long term (current) use of opiate analgesic: Secondary | ICD-10-CM | POA: Diagnosis not present

## 2022-03-13 DIAGNOSIS — I639 Cerebral infarction, unspecified: Secondary | ICD-10-CM

## 2022-03-13 DIAGNOSIS — R471 Dysarthria and anarthria: Secondary | ICD-10-CM | POA: Diagnosis present

## 2022-03-13 DIAGNOSIS — R4781 Slurred speech: Secondary | ICD-10-CM | POA: Diagnosis present

## 2022-03-13 DIAGNOSIS — E222 Syndrome of inappropriate secretion of antidiuretic hormone: Secondary | ICD-10-CM | POA: Diagnosis not present

## 2022-03-13 DIAGNOSIS — E871 Hypo-osmolality and hyponatremia: Secondary | ICD-10-CM | POA: Diagnosis not present

## 2022-03-13 DIAGNOSIS — A499 Bacterial infection, unspecified: Secondary | ICD-10-CM | POA: Diagnosis not present

## 2022-03-13 DIAGNOSIS — M7989 Other specified soft tissue disorders: Secondary | ICD-10-CM | POA: Diagnosis not present

## 2022-03-13 DIAGNOSIS — Z91040 Latex allergy status: Secondary | ICD-10-CM

## 2022-03-13 DIAGNOSIS — R5381 Other malaise: Secondary | ICD-10-CM | POA: Diagnosis not present

## 2022-03-13 DIAGNOSIS — I69328 Other speech and language deficits following cerebral infarction: Secondary | ICD-10-CM | POA: Diagnosis not present

## 2022-03-13 DIAGNOSIS — Z886 Allergy status to analgesic agent status: Secondary | ICD-10-CM

## 2022-03-13 DIAGNOSIS — Z79899 Other long term (current) drug therapy: Secondary | ICD-10-CM

## 2022-03-13 DIAGNOSIS — I6389 Other cerebral infarction: Secondary | ICD-10-CM | POA: Diagnosis not present

## 2022-03-13 DIAGNOSIS — M069 Rheumatoid arthritis, unspecified: Secondary | ICD-10-CM | POA: Diagnosis not present

## 2022-03-13 DIAGNOSIS — N029 Recurrent and persistent hematuria with unspecified morphologic changes: Secondary | ICD-10-CM | POA: Diagnosis not present

## 2022-03-13 DIAGNOSIS — R54 Age-related physical debility: Secondary | ICD-10-CM | POA: Diagnosis not present

## 2022-03-13 DIAGNOSIS — Z8673 Personal history of transient ischemic attack (TIA), and cerebral infarction without residual deficits: Secondary | ICD-10-CM

## 2022-03-13 DIAGNOSIS — R29705 NIHSS score 5: Secondary | ICD-10-CM | POA: Diagnosis present

## 2022-03-13 DIAGNOSIS — I10 Essential (primary) hypertension: Secondary | ICD-10-CM | POA: Diagnosis not present

## 2022-03-13 DIAGNOSIS — Z96641 Presence of right artificial hip joint: Secondary | ICD-10-CM | POA: Diagnosis present

## 2022-03-13 DIAGNOSIS — Z716 Tobacco abuse counseling: Secondary | ICD-10-CM

## 2022-03-13 DIAGNOSIS — Z823 Family history of stroke: Secondary | ICD-10-CM

## 2022-03-13 DIAGNOSIS — Z961 Presence of intraocular lens: Secondary | ICD-10-CM | POA: Diagnosis present

## 2022-03-13 DIAGNOSIS — Z90710 Acquired absence of both cervix and uterus: Secondary | ICD-10-CM

## 2022-03-13 LAB — COMPREHENSIVE METABOLIC PANEL
ALT: 16 U/L (ref 0–44)
AST: 19 U/L (ref 15–41)
Albumin: 3.5 g/dL (ref 3.5–5.0)
Alkaline Phosphatase: 115 U/L (ref 38–126)
Anion gap: 7 (ref 5–15)
BUN: 13 mg/dL (ref 8–23)
CO2: 24 mmol/L (ref 22–32)
Calcium: 9 mg/dL (ref 8.9–10.3)
Chloride: 98 mmol/L (ref 98–111)
Creatinine, Ser: 0.57 mg/dL (ref 0.44–1.00)
GFR, Estimated: >60 mL/min (ref >60)
Glucose, Bld: 106 mg/dL — ABNORMAL HIGH (ref 70–99)
Potassium: 3.8 mmol/L (ref 3.5–5.1)
Sodium: 129 mmol/L — ABNORMAL LOW (ref 135–145)
Total Bilirubin: 0.7 mg/dL (ref 0.3–1.2)
Total Protein: 8.3 g/dL — ABNORMAL HIGH (ref 6.5–8.1)

## 2022-03-13 LAB — DIFFERENTIAL
Abs Immature Granulocytes: 0 10*3/uL (ref 0.00–0.07)
Basophils Absolute: 0 10*3/uL (ref 0.0–0.1)
Basophils Relative: 0 %
Eosinophils Absolute: 0.1 10*3/uL (ref 0.0–0.5)
Eosinophils Relative: 1 %
Lymphocytes Relative: 95 %
Lymphs Abs: 6.9 10*3/uL — ABNORMAL HIGH (ref 0.7–4.0)
Monocytes Absolute: 0.1 10*3/uL (ref 0.1–1.0)
Monocytes Relative: 2 %
Neutro Abs: 0.1 10*3/uL — CL (ref 1.7–7.7)
Neutrophils Relative %: 2 %

## 2022-03-13 LAB — PROTIME-INR
INR: 1 (ref 0.8–1.2)
Prothrombin Time: 13.1 seconds (ref 11.4–15.2)

## 2022-03-13 LAB — CBC
HCT: 40.1 % (ref 36.0–46.0)
Hemoglobin: 14 g/dL (ref 12.0–15.0)
MCH: 34.6 pg — ABNORMAL HIGH (ref 26.0–34.0)
MCHC: 34.9 g/dL (ref 30.0–36.0)
MCV: 99 fL (ref 80.0–100.0)
Platelets: 226 10*3/uL (ref 150–400)
RBC: 4.05 MIL/uL (ref 3.87–5.11)
RDW: 16.5 % — ABNORMAL HIGH (ref 11.5–15.5)
WBC: 7.3 10*3/uL (ref 4.0–10.5)
nRBC: 0.3 % — ABNORMAL HIGH (ref 0.0–0.2)

## 2022-03-13 LAB — RAPID URINE DRUG SCREEN, HOSP PERFORMED
Amphetamines: NOT DETECTED
Barbiturates: NOT DETECTED
Benzodiazepines: NOT DETECTED
Cocaine: NOT DETECTED
Opiates: POSITIVE — AB
Tetrahydrocannabinol: NOT DETECTED

## 2022-03-13 LAB — URINALYSIS, ROUTINE W REFLEX MICROSCOPIC
Bilirubin Urine: NEGATIVE
Glucose, UA: NEGATIVE mg/dL
Hgb urine dipstick: NEGATIVE
Ketones, ur: NEGATIVE mg/dL
Leukocytes,Ua: NEGATIVE
Nitrite: NEGATIVE
Protein, ur: NEGATIVE mg/dL
Specific Gravity, Urine: 1.005 (ref 1.005–1.030)
pH: 6 (ref 5.0–8.0)

## 2022-03-13 LAB — APTT: aPTT: 38 seconds — ABNORMAL HIGH (ref 24–36)

## 2022-03-13 LAB — ETHANOL: Alcohol, Ethyl (B): <10 mg/dL (ref <10)

## 2022-03-13 MED ORDER — IOHEXOL 350 MG/ML SOLN
75.0000 mL | Freq: Once | INTRAVENOUS | Status: AC | PRN
  Administered 2022-03-13: 75 mL via INTRAVENOUS

## 2022-03-13 NOTE — H&P (Signed)
History and Physical    Patient: Sheri Pope KYH:[id number] DOB: 1941/02/01 DOA: 03/13/2022 DOS: the patient was seen and examined on 03/14/2022 PCP: Celene Squibb, MD  Patient coming from: Home  Chief Complaint:  Chief Complaint  Patient presents with   Weakness   HPI: SILVER ACHEY is a 82 y.o. female with medical history significant of hyperlipidemia, severe/advanced rheumatoid arthritis, osteoarthritis and history of leukemia who presents to the emergency department due to right-sided weakness which started earlier today around 11 AM, EMS was activated, blood sugar was noted to be high in the 200 range and patient refused transport to the ED at that time.  In the afternoon/evening, symptoms worsened with increased difficulty in being able to ambulate and increased tendency towards falling.  Right arm and leg felt weak and she felt like she was dragging her leg a little while trying to walk.  Patient denies slurred speech, facial droop, chest pain, shortness of breath, cough, nausea, vomiting or abdominal pain.  ED Course:  In the emergency department, BP was 165/79, 1 over the signs are within normal range.  Workup in the ED showed normal CBC, normal BMP except for sodium of 129, alcohol level was less than 10, urine drug screen was positive for opiates (patient takes Norco at home). CT head without contrast showed no CT evidence for acute intracranial abnormality CT head without contrast showed no CT evidence for acute intracranial abnormality Hospitalist was asked to admit patient for further evaluation and management.  Review of Systems: Review of systems as noted in the HPI. All other systems reviewed and are negative.   Past Medical History:  Diagnosis Date   Arthritis    RA AND OA --PAIN AND SWELLIN IN LEFT KNEE   Blood in urine    NEGATIVE UROLOGY WORK UP --BUT ALWAYS HAS BLOOD IN URINE   GERD (gastroesophageal reflux disease)    H/O hiatal hernia    Hyperlipidemia     Leukemia (HCC)    PONV (postoperative nausea and vomiting)    ALWAYS SICK AFTER SURGERIES EXCEPT AFTER HIP REPLACMENT 2007   Vitamin D deficiency    Past Surgical History:  Procedure Laterality Date   ABDOMINAL HYSTERECTOMY  1870'S   BREAST SURGERY     BREAST BIOSPIES   CATARACT EXTRACTION W/PHACO Left 05/30/2012   Procedure: CATARACT EXTRACTION PHACO AND INTRAOCULAR LENS PLACEMENT ([place]);  Surgeon: Tonny Branch, MD;  Location: [facility];  Service: Ophthalmology;  Laterality: Left;  CDE: 16.99   CATARACT EXTRACTION W/PHACO Right 06/24/2012   Procedure: CATARACT EXTRACTION PHACO AND INTRAOCULAR LENS PLACEMENT ([facility]);  Surgeon: Tonny Branch, MD;  Location: [facility];  Service: Ophthalmology;  Laterality: Right;  CDE:17.92   COLONOSCOPY WITH PROPOFOL N/A 10/14/2020   Procedure: COLONOSCOPY WITH PROPOFOL;  Surgeon: Daneil Dolin, MD;  Location: [facility];  Service: Endoscopy;  Laterality: N/A;  ASA III / 12:30   ESOPHAGOGASTRODUODENOSCOPY (EGD) WITH PROPOFOL N/A 10/14/2020   Procedure: ESOPHAGOGASTRODUODENOSCOPY (EGD) WITH PROPOFOL;  Surgeon: Daneil Dolin, MD;  Location: [facility];  Service: Endoscopy;  Laterality: N/A;   HIP SURGERY     JOINT REPLACEMENT  2007   RIGHT HIP REPLACEMENT   LEFT KNEE ARTHROSCOPY  AUG 2012   MALONEY DILATION  10/14/2020   Procedure: MALONEY DILATION;  Surgeon: Daneil Dolin, MD;  Location: [facility];  Service: Endoscopy;;   MULTIPLE ORTHOPEDIC SURGERIES     ON BOTH HANDS AND BOTH FEET, RIGHT ELBOW   SPLEENECTOMY  IN THE 70'S   FOR LARGE CYST   TOTAL KNEE ARTHROPLASTY  11/10/2011   Procedure: TOTAL KNEE ARTHROPLASTY;  Surgeon: Mcarthur Rossetti, MD;  Location: [facility];  Service: Orthopedics;  Laterality: Left;  Left Total Knee Arthroplasty   YAG LASER APPLICATION Bilateral    Dr. Geoffry Paradise    Social History:  reports that she has been smoking cigarettes. She has a 50.00 pack-year smoking history. She has never used smokeless tobacco. She reports that she  does not drink alcohol and does not use drugs.   Allergies  Allergen Reactions   Codeine Nausea And Vomiting   Prednisone Other (See Comments)    Thought she was having a heart attack   Sulfa Antibiotics Other (See Comments)    unknown   Aleve [Naproxen] Anxiety    Jittery and hot flashes   Latex Rash    Family History  Problem Relation Age of Onset   Diabetes Sister    Macular degeneration Sister    Heart attack Mother    Stroke Father    Dementia Father    Cancer Brother    Healthy Sister    Healthy Sister    Diabetes Brother      Prior to Admission medications   Medication Sig Start Date End Date Taking? Authorizing Provider  ALPRAZolam (XANAX) 0.25 MG tablet Take 0.125 mg by mouth at bedtime. 06/14/20  Yes [provider]  Alum & Mag Hydroxide-Simeth (MYLANTA PO) Take by mouth as needed.    [provider]  HYDROcodone-acetaminophen (NORCO/VICODIN) 5-325 MG tablet Take 0.5 tablets by mouth 2 (two) times daily. 08/03/20   [provider]  lactose free nutrition (BOOST) LIQD Take 237 mLs by mouth daily.    [provider]  methocarbamol (ROBAXIN) 500 MG tablet Take 500 mg by mouth 2 (two) times daily. 07/28/20   [provider]  ondansetron (ZOFRAN-ODT) 4 MG disintegrating tablet Take 1 tablet (4 mg total) by mouth every 8 (eight) hours as needed for nausea or vomiting. 05/31/21   Volney American, PA-C  Polyvinyl Alcohol-Povidone (REFRESH OP) Place 1 drop into both eyes 4 (four) times daily as needed (dry eyes).    [provider]  prochlorperazine (COMPAZINE) 5 MG tablet Take 1 tablet (5 mg total) by mouth every 6 (six) hours as needed for nausea or vomiting. 03/14/21   Derek Jack, MD  trolamine salicylate (ASPERCREME) 10 % cream Apply 1 application topically as needed for muscle pain.    [provider]    Physical Exam: BP (!) 151/78   Pulse 77   Temp 98 F (36.7 C) (Oral)   Resp 16   Ht '5\' 1"'$   (1.549 m)   Wt 47.9 kg   SpO2 94%   BMI 19.97 kg/m   General: 83 y.o. year-old female well developed and in no acute distress.  Alert and oriented x3. HEENT: NCAT, EOMI Neck: Supple, trachea medial Cardiovascular: Regular rate and rhythm with no rubs or gallops.  No thyromegaly or JVD noted.  No lower extremity edema. 2/4 pulses in all 4 extremities. Respiratory: Clear to auscultation with no wheezes or rales. Good inspiratory effort. Abdomen: Soft, nontender nondistended with normal bowel sounds x4 quadrants. Muskuloskeletal: No cyanosis, clubbing or edema noted bilaterally Neuro: CN II-XII intact, decreased strength in the right sided extremities, sensation, reflexes intact Skin: No ulcerative lesions noted or rashes Psychiatry: Judgement and insight appear normal. Mood is appropriate for condition and setting  Labs on Admission:  Basic Metabolic Panel: Recent Labs  Lab 03/13/22 2113  NA 129*  K 3.8  CL 98  CO2 24  GLUCOSE 106*  BUN 13  CREATININE 0.57  CALCIUM 9.0   Liver Function Tests: Recent Labs  Lab 03/13/22 2113  AST 19  ALT 16  ALKPHOS 115  BILITOT 0.7  PROT 8.3*  ALBUMIN 3.5   No results for input(s): "LIPASE", "AMYLASE" in the last 168 hours. No results for input(s): "AMMONIA" in the last 168 hours. CBC: Recent Labs  Lab 03/13/22 2113  WBC 7.3  NEUTROABS 0.1*  HGB 14.0  HCT 40.1  MCV 99.0  PLT 226   Cardiac Enzymes: No results for input(s): "CKTOTAL", "CKMB", "CKMBINDEX", "TROPONINI" in the last 168 hours.  BNP (last 3 results) No results for input(s): "BNP" in the last 8760 hours.  ProBNP (last 3 results) No results for input(s): "PROBNP" in the last 8760 hours.  CBG: Recent Labs  Lab 03/14/22 0007  GLUCAP 105*    Radiological Exams on Admission: CT ANGIO HEAD NECK W WO CM  Result Date: 03/13/2022 CLINICAL DATA:  Stroke/TIA EXAM: CT ANGIOGRAPHY HEAD AND NECK TECHNIQUE: Multidetector CT imaging of the head and neck was  performed using the standard protocol during bolus administration of intravenous contrast. Multiplanar CT image reconstructions and MIPs were obtained to evaluate the vascular anatomy. Carotid stenosis measurements (when applicable) are obtained utilizing NASCET criteria, using the distal internal carotid diameter as the denominator. RADIATION DOSE REDUCTION: This exam was performed according to the departmental dose-optimization program which includes automated exposure control, adjustment of the mA and/or kV according to patient size and/or use of iterative reconstruction technique. CONTRAST:  72m OMNIPAQUE IOHEXOL 350 MG/ML SOLN COMPARISON:  None Available. FINDINGS: CTA NECK FINDINGS SKELETON: There is no bony spinal canal stenosis. No lytic or blastic lesion. OTHER NECK: Mildly enlarged and heterogeneous thyroid gland UPPER CHEST: Biapical paraseptal emphysema AORTIC ARCH: There is calcific atherosclerosis of the aortic arch. There is no aneurysm, dissection or hemodynamically significant stenosis of the visualized portion of the aorta. Conventional 3 vessel aortic branching pattern. The visualized proximal subclavian arteries are widely patent. RIGHT CAROTID SYSTEM: No dissection, occlusion or aneurysm. Mild atherosclerotic calcification at the carotid bifurcation without hemodynamically significant stenosis. LEFT CAROTID SYSTEM: No dissection, occlusion or aneurysm. Mild atherosclerotic calcification at the carotid bifurcation without hemodynamically significant stenosis. VERTEBRAL ARTERIES: Right dominant configuration. Both origins are clearly patent. There is no dissection, occlusion or flow-limiting stenosis to the skull base (V1-V3 segments). CTA HEAD FINDINGS POSTERIOR CIRCULATION: --Vertebral arteries: Normal V4 segments. --Inferior cerebellar arteries: Normal. --Basilar artery: Normal. --Superior cerebellar arteries: Normal. --Posterior cerebral arteries (PCA): Normal. ANTERIOR CIRCULATION:  --Intracranial internal carotid arteries: Normal. --Anterior cerebral arteries (ACA): Normal. Both A1 segments are present. Patent anterior communicating artery (a-comm). --Middle cerebral arteries (MCA): Normal. VENOUS SINUSES: As permitted by contrast timing, patent. ANATOMIC VARIANTS: None Review of the MIP images confirms the above findings. IMPRESSION: 1. No emergent large vessel occlusion or hemodynamically significant stenosis of the head or neck. 2. Mild bilateral carotid bifurcation atherosclerosis without hemodynamically significant stenosis. 3. Mildly enlarged and heterogeneous thyroid gland. Incidental heterogeneous and enlarged thyroid. Recommend non-emergent thyroid ultrasound. Reference: J Am Coll Radiol. 2015 Feb;12(2): 143-50 Aortic Atherosclerosis (ICD10-I70.0) and Emphysema (ICD10-J43.9). Electronically Signed   By: KUlyses JarredM.D.   On: 03/13/2022 23:50   CT HEAD WO CONTRAST  Result Date: 03/13/2022 CLINICAL DATA:  Generalized weakness for 3 days EXAM: CT HEAD WITHOUT CONTRAST TECHNIQUE:  Contiguous axial images were obtained from the base of the skull through the vertex without intravenous contrast. RADIATION DOSE REDUCTION: This exam was performed according to the departmental dose-optimization program which includes automated exposure control, adjustment of the mA and/or kV according to patient size and/or use of iterative reconstruction technique. COMPARISON:  None Available. FINDINGS: Brain: No acute territorial infarction, hemorrhage or intracranial mass. Probable chronic lacunar infarct left thalamus. Mild chronic small vessel ischemic changes of the white matter. Nonenlarged ventricles Vascular: No hyperdense vessels.  Carotid vascular calcification Skull: Normal. Negative for fracture or focal lesion. Sinuses/Orbits: No acute finding. Other: None IMPRESSION: 1. No CT evidence for acute intracranial abnormality. 2. Mild chronic small vessel ischemic changes of the white matter.  Electronically Signed   By: Donavan Foil M.D.   On: 03/13/2022 21:29    EKG: I independently viewed the EKG done and my findings are as followed: Normal sinus rhythm at a rate of 83 bpm  Assessment/Plan Present on Admission:  Rheumatoid arthritis ([place])  Principal Problem:   Right sided weakness Active Problems:   Rheumatoid arthritis (HCC)   Hyponatremia   Insomnia   Anxiety   Osteoarthritis   Tobacco abuse  Right-sided weakness rule out acute ischemic stroke Patient will be admitted to telemetry unit  CT head without contrast showed no CT evidence for acute intracranial abnormality CT head without contrast showed no CT evidence for acute intracranial abnormality Echocardiogram in the morning MRI of brain without contrast in the morning Consider starting aspirin 81 mg after MRI of brain Consider starting statin to maintain LDL < 70 Continue fall precautions and neuro checks Lipid panel and hemoglobin A1c will be checked Continue PT/OT eval and treat Consider SLP evaluation if patient fails bedside swallow eval Bedside swallow eval by nursing prior to diet Consider tele neurology consult status post imaging studies  Hyponatremia Na 129 Orthostatic BP will be checked to rule out postural hypotension Continue gentle hydration Continue to monitor sodium with serial BMPs Urine osmolality, serum osmolality and urine sodium will be checked  Insomnia/anxiety Continue Xanax  Rheumatoid arthritis/osteoarthritis Continue as needed hydrocodone along with methocarbamol   Tobacco abuse Patient was counseled on tobacco abuse cessation  DVT prophylaxis: SCDs (consider starting patient on chemoprophylaxis after MRI of brain)  Code Status: DNR  Family Communication: None at bedside  Consults: Neurology  Severity of Illness: The appropriate patient status for this patient is OBSERVATION. Observation status is judged to be reasonable and necessary in order to provide the  required intensity of service to ensure the patient's safety. The patient's presenting symptoms, physical exam findings, and initial radiographic and laboratory data in the context of their medical condition is felt to place them at decreased risk for further clinical deterioration. Furthermore, it is anticipated that the patient will be medically stable for discharge from the hospital within 2 midnights of admission.   Author: Bernadette Hoit, DO 03/14/2022 1:32 AM  For on call review [email or web address].

## 2022-03-13 NOTE — ED Notes (Signed)
Pt passed swallow screen

## 2022-03-13 NOTE — ED Triage Notes (Signed)
Pt arrived via [facility] c/o increased generalized weakness the past 3 days, "near falls" the past several days. Hx of arthritis and leukemia that is not being treated at this time. Cbg 140s

## 2022-03-13 NOTE — ED Notes (Signed)
Patient transported to CT 

## 2022-03-13 NOTE — ED Provider Notes (Signed)
[facility] EMERGENCY DEPARTMENT Provider Note   CSN: [id number] Arrival date & time: 03/13/22  2028     History  Chief Complaint  Patient presents with   Weakness    Sheri Pope is a 82 y.o. female.  HPI 82 year old female presents with right-sided weakness.  She first noticed it at 11 AM today.  Initially called EMS and her blood sugar was high, somewhere in the 200 range.  She refused transport at that time.  Symptoms seem to get worse this afternoon/evening so EMS was called again and brought her in.  She feels like the weakness is getting worse.  She has noticed weakness in her right arm and right leg.  She was unable to twist open a bottle with her hand and her right leg seems to be dragging a little when she was trying to walk.  She had a low-grade temperature of 99 but denies any infectious symptoms such as cough or urinary tract infection symptoms.  No prior stroke before.  No headache or neck pain.  No visual complaints, slurred speech or facial droop.  Home Medications Prior to Admission medications   Medication Sig Start Date End Date Taking? Authorizing Provider  ALPRAZolam (XANAX) 0.25 MG tablet Take 0.125 mg by mouth at bedtime. 06/14/20   [provider]  Alum & Mag Hydroxide-Simeth (MYLANTA PO) Take by mouth as needed.    [provider]  HYDROcodone-acetaminophen (NORCO/VICODIN) 5-325 MG tablet Take 0.5 tablets by mouth 2 (two) times daily. 08/03/20   [provider]  lactose free nutrition (BOOST) LIQD Take 237 mLs by mouth daily.    [provider]  methocarbamol (ROBAXIN) 500 MG tablet Take 500 mg by mouth 2 (two) times daily. 07/28/20   [provider]  ondansetron (ZOFRAN-ODT) 4 MG disintegrating tablet Take 1 tablet (4 mg total) by mouth every 8 (eight) hours as needed for nausea or vomiting. 05/31/21   Volney American, PA-C  Polyvinyl Alcohol-Povidone (REFRESH OP) Place 1 drop into both eyes 4 (four) times daily  as needed (dry eyes).    [provider]  prochlorperazine (COMPAZINE) 5 MG tablet Take 1 tablet (5 mg total) by mouth every 6 (six) hours as needed for nausea or vomiting. 03/14/21   Derek Jack, MD  trolamine salicylate (ASPERCREME) 10 % cream Apply 1 application topically as needed for muscle pain.    [provider]      Allergies    Codeine, Prednisone, Sulfa antibiotics, Aleve [naproxen], and Latex    Review of Systems   Review of Systems  Constitutional:  Negative for fever.  Musculoskeletal:  Negative for neck pain.  Neurological:  Positive for weakness. Negative for headaches.    Physical Exam Updated Vital Signs BP (!) 159/84   Pulse 79   Temp 98 F (36.7 C) (Oral)   Resp 16   Ht '5\' 1"'$  (1.549 m)   Wt 47.9 kg   SpO2 97%   BMI 19.97 kg/m  Physical Exam Vitals and nursing note reviewed.  Constitutional:      Appearance: She is well-developed.  HENT:     Head: Normocephalic and atraumatic.  Eyes:     Extraocular Movements: Extraocular movements intact.     Pupils: Pupils are equal, round, and reactive to light.  Cardiovascular:     Rate and Rhythm: Normal rate and regular rhythm.     Pulses:          Radial pulses are 2+ on the right  side.       Posterior tibial pulses are 2+ on the right side.     Heart sounds: Normal heart sounds.  Pulmonary:     Effort: Pulmonary effort is normal.     Breath sounds: Normal breath sounds.  Abdominal:     General: There is no distension.     Palpations: Abdomen is soft.     Tenderness: There is no abdominal tenderness.  Skin:    General: Skin is warm and dry.  Neurological:     Mental Status: She is alert.     Comments: No slurred speech or facial droop. 5/5 strength in LUE, LLE. 4/5 strength in RUE, RLE. Grossly normal sensation in RLE, LUE, LLE. Subjective decreased sensation in RUE to light touch     ED Results / Procedures / Treatments   Labs (all labs ordered are listed, but only  abnormal results are displayed) Labs Reviewed  APTT - Abnormal; Notable for the following components:      Result Value   aPTT 38 (*)    All other components within normal limits  CBC - Abnormal; Notable for the following components:   MCH 34.6 (*)    RDW 16.5 (*)    nRBC 0.3 (*)    All other components within normal limits  DIFFERENTIAL - Abnormal; Notable for the following components:   Neutro Abs 0.1 (*)    Lymphs Abs 6.9 (*)    All other components within normal limits  COMPREHENSIVE METABOLIC PANEL - Abnormal; Notable for the following components:   Sodium 129 (*)    Glucose, Bld 106 (*)    Total Protein 8.3 (*)    All other components within normal limits  RAPID URINE DRUG SCREEN, HOSP PERFORMED - Abnormal; Notable for the following components:   Opiates POSITIVE (*)    All other components within normal limits  URINALYSIS, ROUTINE W REFLEX MICROSCOPIC - Abnormal; Notable for the following components:   Color, Urine STRAW (*)    All other components within normal limits  ETHANOL  PROTIME-INR  I-STAT CHEM 8, ED  CBG MONITORING, ED    EKG EKG Interpretation  Date/Time:  Monday March 13 2022 20:37:35 EST Ventricular Rate:  83 PR Interval:  126 QRS Duration: 89 QT Interval:  394 QTC Calculation: 463 R Axis:   84 Text Interpretation: Sinus rhythm Borderline right axis deviation no significant change since 2014 Confirmed by Sherwood Gambler [phone]) on 03/13/2022 10:47:42 PM  Radiology CT HEAD WO CONTRAST  Result Date: 03/13/2022 CLINICAL DATA:  Generalized weakness for 3 days EXAM: CT HEAD WITHOUT CONTRAST TECHNIQUE: Contiguous axial images were obtained from the base of the skull through the vertex without intravenous contrast. RADIATION DOSE REDUCTION: This exam was performed according to the departmental dose-optimization program which includes automated exposure control, adjustment of the mA and/or kV according to patient size and/or use of iterative reconstruction  technique. COMPARISON:  None Available. FINDINGS: Brain: No acute territorial infarction, hemorrhage or intracranial mass. Probable chronic lacunar infarct left thalamus. Mild chronic small vessel ischemic changes of the white matter. Nonenlarged ventricles Vascular: No hyperdense vessels.  Carotid vascular calcification Skull: Normal. Negative for fracture or focal lesion. Sinuses/Orbits: No acute finding. Other: None IMPRESSION: 1. No CT evidence for acute intracranial abnormality. 2. Mild chronic small vessel ischemic changes of the white matter. Electronically Signed   By: Donavan Foil M.D.   On: 03/13/2022 21:29    Procedures Procedures    Medications Ordered in ED Medications -  No data to display  ED Course/ Medical Decision Making/ A&P                             Medical Decision Making Amount and/or Complexity of Data Reviewed Labs: ordered.    Details: Chronic neutropenia, unchanged.  WBC normal however. Radiology: ordered and independent interpretation performed.    Details: CT head without head bleed ECG/medicine tests: ordered and independent interpretation performed.    Details: No acute ischemia  Risk Decision regarding hospitalization.   Patient presents with right-sided weakness.  Outside of the TNK window.  CT without head bleed.  Moderately hypertensive but we will leave this alone for now.  She will need MRI, which is not available at this time.  Will need admission for stroke workup.  Discussed with Dr. Josephine Cables.  He asked for CTA head and neck as well as this will help neuro tomorrow morning.  This has been ordered.        Final Clinical Impression(s) / ED Diagnoses Final diagnoses:  Right sided weakness    Rx / DC Orders ED Discharge Orders     None         Sherwood Gambler, MD 03/13/22 2248

## 2022-03-14 ENCOUNTER — Observation Stay (HOSPITAL_COMMUNITY): Payer: Medicare HMO

## 2022-03-14 DIAGNOSIS — R471 Dysarthria and anarthria: Secondary | ICD-10-CM | POA: Diagnosis present

## 2022-03-14 DIAGNOSIS — Z66 Do not resuscitate: Secondary | ICD-10-CM | POA: Diagnosis present

## 2022-03-14 DIAGNOSIS — E871 Hypo-osmolality and hyponatremia: Secondary | ICD-10-CM | POA: Insufficient documentation

## 2022-03-14 DIAGNOSIS — G8191 Hemiplegia, unspecified affecting right dominant side: Secondary | ICD-10-CM | POA: Diagnosis present

## 2022-03-14 DIAGNOSIS — C9590 Leukemia, unspecified not having achieved remission: Secondary | ICD-10-CM | POA: Diagnosis present

## 2022-03-14 DIAGNOSIS — K219 Gastro-esophageal reflux disease without esophagitis: Secondary | ICD-10-CM | POA: Diagnosis present

## 2022-03-14 DIAGNOSIS — E222 Syndrome of inappropriate secretion of antidiuretic hormone: Secondary | ICD-10-CM | POA: Diagnosis present

## 2022-03-14 DIAGNOSIS — D708 Other neutropenia: Secondary | ICD-10-CM | POA: Diagnosis present

## 2022-03-14 DIAGNOSIS — M1990 Unspecified osteoarthritis, unspecified site: Secondary | ICD-10-CM | POA: Insufficient documentation

## 2022-03-14 DIAGNOSIS — I639 Cerebral infarction, unspecified: Secondary | ICD-10-CM

## 2022-03-14 DIAGNOSIS — Z720 Tobacco use: Secondary | ICD-10-CM | POA: Insufficient documentation

## 2022-03-14 DIAGNOSIS — M7989 Other specified soft tissue disorders: Secondary | ICD-10-CM | POA: Diagnosis not present

## 2022-03-14 DIAGNOSIS — R4781 Slurred speech: Secondary | ICD-10-CM | POA: Diagnosis present

## 2022-03-14 DIAGNOSIS — M069 Rheumatoid arthritis, unspecified: Secondary | ICD-10-CM | POA: Diagnosis present

## 2022-03-14 DIAGNOSIS — Z8249 Family history of ischemic heart disease and other diseases of the circulatory system: Secondary | ICD-10-CM | POA: Diagnosis not present

## 2022-03-14 DIAGNOSIS — R531 Weakness: Secondary | ICD-10-CM | POA: Diagnosis present

## 2022-03-14 DIAGNOSIS — E785 Hyperlipidemia, unspecified: Secondary | ICD-10-CM | POA: Diagnosis present

## 2022-03-14 DIAGNOSIS — N390 Urinary tract infection, site not specified: Secondary | ICD-10-CM | POA: Diagnosis not present

## 2022-03-14 DIAGNOSIS — G4700 Insomnia, unspecified: Secondary | ICD-10-CM | POA: Diagnosis present

## 2022-03-14 DIAGNOSIS — Z79899 Other long term (current) drug therapy: Secondary | ICD-10-CM | POA: Diagnosis not present

## 2022-03-14 DIAGNOSIS — I6389 Other cerebral infarction: Secondary | ICD-10-CM | POA: Diagnosis not present

## 2022-03-14 DIAGNOSIS — Z96641 Presence of right artificial hip joint: Secondary | ICD-10-CM | POA: Diagnosis present

## 2022-03-14 DIAGNOSIS — E049 Nontoxic goiter, unspecified: Secondary | ICD-10-CM | POA: Diagnosis present

## 2022-03-14 DIAGNOSIS — I69351 Hemiplegia and hemiparesis following cerebral infarction affecting right dominant side: Secondary | ICD-10-CM | POA: Diagnosis not present

## 2022-03-14 DIAGNOSIS — R29705 NIHSS score 5: Secondary | ICD-10-CM | POA: Diagnosis present

## 2022-03-14 DIAGNOSIS — Z885 Allergy status to narcotic agent status: Secondary | ICD-10-CM | POA: Diagnosis not present

## 2022-03-14 DIAGNOSIS — F419 Anxiety disorder, unspecified: Secondary | ICD-10-CM | POA: Insufficient documentation

## 2022-03-14 DIAGNOSIS — Z882 Allergy status to sulfonamides status: Secondary | ICD-10-CM | POA: Diagnosis not present

## 2022-03-14 DIAGNOSIS — A499 Bacterial infection, unspecified: Secondary | ICD-10-CM | POA: Diagnosis not present

## 2022-03-14 DIAGNOSIS — I6381 Other cerebral infarction due to occlusion or stenosis of small artery: Secondary | ICD-10-CM | POA: Diagnosis not present

## 2022-03-14 DIAGNOSIS — Z79891 Long term (current) use of opiate analgesic: Secondary | ICD-10-CM | POA: Diagnosis not present

## 2022-03-14 DIAGNOSIS — F17210 Nicotine dependence, cigarettes, uncomplicated: Secondary | ICD-10-CM | POA: Diagnosis present

## 2022-03-14 LAB — COMPREHENSIVE METABOLIC PANEL
ALT: 15 U/L (ref 0–44)
AST: 16 U/L (ref 15–41)
Albumin: 3.3 g/dL — ABNORMAL LOW (ref 3.5–5.0)
Alkaline Phosphatase: 111 U/L (ref 38–126)
Anion gap: 9 (ref 5–15)
BUN: 10 mg/dL (ref 8–23)
CO2: 22 mmol/L (ref 22–32)
Calcium: 8.8 mg/dL — ABNORMAL LOW (ref 8.9–10.3)
Chloride: 99 mmol/L (ref 98–111)
Creatinine, Ser: 0.48 mg/dL (ref 0.44–1.00)
GFR, Estimated: >60 mL/min (ref >60)
Glucose, Bld: 107 mg/dL — ABNORMAL HIGH (ref 70–99)
Potassium: 3.7 mmol/L (ref 3.5–5.1)
Sodium: 130 mmol/L — ABNORMAL LOW (ref 135–145)
Total Bilirubin: 0.3 mg/dL (ref 0.3–1.2)
Total Protein: 8.1 g/dL (ref 6.5–8.1)

## 2022-03-14 LAB — ECHOCARDIOGRAM COMPLETE
AR max vel: 2.4 cm2
AV Area VTI: 2.03 cm2
AV Area mean vel: 1.99 cm2
AV Mean grad: 2 mmHg
AV Peak grad: 3.9 mmHg
Ao pk vel: 0.99 m/s
Area-P 1/2: 3.23 cm2
Height: 61 in
MV VTI: 2.15 cm2
S' Lateral: 3.2 cm
Weight: 1516.8 oz

## 2022-03-14 LAB — CBC
HCT: 40 % (ref 36.0–46.0)
Hemoglobin: 13.7 g/dL (ref 12.0–15.0)
MCH: 33.9 pg (ref 26.0–34.0)
MCHC: 34.3 g/dL (ref 30.0–36.0)
MCV: 99 fL (ref 80.0–100.0)
Platelets: 225 10*3/uL (ref 150–400)
RBC: 4.04 MIL/uL (ref 3.87–5.11)
RDW: 16.5 % — ABNORMAL HIGH (ref 11.5–15.5)
WBC: 7.1 10*3/uL (ref 4.0–10.5)
nRBC: 0 % (ref 0.0–0.2)

## 2022-03-14 LAB — LIPID PANEL
Cholesterol: 162 mg/dL (ref 0–200)
HDL: 45 mg/dL (ref >40)
LDL Cholesterol: 98 mg/dL (ref 0–99)
Total CHOL/HDL Ratio: 3.6 RATIO
Triglycerides: 94 mg/dL (ref <150)
VLDL: 19 mg/dL (ref 0–40)

## 2022-03-14 LAB — PHOSPHORUS: Phosphorus: 3.1 mg/dL (ref 2.5–4.6)

## 2022-03-14 LAB — HEMOGLOBIN A1C
Hgb A1c MFr Bld: 5.7 % — ABNORMAL HIGH (ref 4.8–5.6)
Mean Plasma Glucose: 116.89 mg/dL

## 2022-03-14 LAB — CBG MONITORING, ED: Glucose-Capillary: 105 mg/dL — ABNORMAL HIGH (ref 70–99)

## 2022-03-14 LAB — MAGNESIUM: Magnesium: 2 mg/dL (ref 1.7–2.4)

## 2022-03-14 LAB — SODIUM, URINE, RANDOM: Sodium, Ur: 26 mmol/L

## 2022-03-14 MED ORDER — SODIUM CHLORIDE 0.9 % IV SOLN: INTRAVENOUS | Status: AC

## 2022-03-14 MED ORDER — METHOCARBAMOL 500 MG PO TABS
500.0000 mg | ORAL_TABLET | Freq: Two times a day (BID) | ORAL | Status: DC
  Administered 2022-03-14 – 2022-03-20 (×13): 500 mg via ORAL
  Filled 2022-03-14 (×13): qty 1

## 2022-03-14 MED ORDER — ACETAMINOPHEN 650 MG RE SUPP: 650.0000 mg | Freq: Four times a day (QID) | RECTAL | Status: DC | PRN

## 2022-03-14 MED ORDER — HYDROCODONE-ACETAMINOPHEN 5-325 MG PO TABS
0.5000 | ORAL_TABLET | Freq: Two times a day (BID) | ORAL | Status: DC
  Administered 2022-03-14 – 2022-03-20 (×13): 0.5 via ORAL
  Filled 2022-03-14 (×13): qty 1

## 2022-03-14 MED ORDER — ACETAMINOPHEN 325 MG PO TABS
650.0000 mg | ORAL_TABLET | Freq: Four times a day (QID) | ORAL | Status: DC | PRN
  Administered 2022-03-16: 325 mg via ORAL
  Filled 2022-03-14 (×2): qty 2

## 2022-03-14 MED ORDER — ASPIRIN 81 MG PO CHEW
81.0000 mg | CHEWABLE_TABLET | Freq: Every day | ORAL | Status: DC
  Administered 2022-03-15 – 2022-03-20 (×6): 81 mg via ORAL
  Filled 2022-03-14 (×6): qty 1

## 2022-03-14 MED ORDER — ALPRAZOLAM 0.5 MG PO TABS
0.2500 mg | ORAL_TABLET | Freq: Every day | ORAL | Status: DC
  Administered 2022-03-14 – 2022-03-19 (×7): 0.25 mg via ORAL
  Filled 2022-03-14 (×7): qty 1

## 2022-03-14 MED ORDER — SODIUM CHLORIDE 0.9 % IV SOLN: INTRAVENOUS | Status: DC

## 2022-03-14 MED ORDER — ASPIRIN 325 MG PO TABS
325.0000 mg | ORAL_TABLET | Freq: Once | ORAL | Status: AC
  Administered 2022-03-14: 325 mg via ORAL
  Filled 2022-03-14: qty 1

## 2022-03-14 MED ORDER — ONDANSETRON HCL 4 MG/2ML IJ SOLN: 4.0000 mg | Freq: Four times a day (QID) | INTRAMUSCULAR | Status: DC | PRN

## 2022-03-14 MED ORDER — CLOPIDOGREL BISULFATE 75 MG PO TABS
75.0000 mg | ORAL_TABLET | Freq: Every day | ORAL | Status: DC
  Administered 2022-03-15 – 2022-03-20 (×6): 75 mg via ORAL
  Filled 2022-03-14 (×6): qty 1

## 2022-03-14 MED ORDER — ONDANSETRON HCL 4 MG PO TABS: 4.0000 mg | ORAL_TABLET | Freq: Four times a day (QID) | ORAL | Status: DC | PRN

## 2022-03-14 MED ORDER — NICOTINE 21 MG/24HR TD PT24
21.0000 mg | MEDICATED_PATCH | Freq: Every day | TRANSDERMAL | Status: DC
  Administered 2022-03-14 – 2022-03-20 (×7): 21 mg via TRANSDERMAL
  Filled 2022-03-14 (×7): qty 1

## 2022-03-14 MED ORDER — ATORVASTATIN CALCIUM 40 MG PO TABS
40.0000 mg | ORAL_TABLET | Freq: Every day | ORAL | Status: DC
  Administered 2022-03-14 – 2022-03-20 (×7): 40 mg via ORAL
  Filled 2022-03-14 (×8): qty 1

## 2022-03-14 MED ORDER — CLOPIDOGREL BISULFATE 75 MG PO TABS
75.0000 mg | ORAL_TABLET | Freq: Every day | ORAL | Status: DC
  Administered 2022-03-14: 75 mg via ORAL
  Filled 2022-03-14: qty 1

## 2022-03-14 NOTE — Consult Note (Signed)
I connected with  Sheri Pope on 03/14/22 by a video enabled telemedicine application and verified that I am speaking with the correct person using two identifiers.   I discussed the limitations of evaluation and management by telemedicine. The patient expressed understanding and agreed to proceed.  Location of patient: [facility] Location of physician: [facility]   Neurology Consultation Reason for Consult: Stroke Referring Physician: Dr. Bernadette Hoit  CC: Weakness  History is obtained from: Patient, chart review  HPI: Sheri Pope is a 82 y.o. female with past medical history of hyperlipidemia, leukemia, rheumatoid arthritis who presented with right-sided weakness.  Patient states her symptoms started around 11 AM yesterday morning.  Initially she noticed some weakness in her right foot.  EMS came but her blood sugar was around 200.  She did not want to go to the hospital.  However of the rest of the day her weakness worsened and she also noticed some slurred speech.  Therefore eventually came to the hospital.  MRI brain was done which showed left thalamic stroke.  Neurology was consulted for further recommendations.  Last known normal: 03/13/2022 11am Event happened at home No tPA as outside window No thrombectomy as no large distal occlusion mRS 0  ROS: All other systems reviewed and negative except as noted in the HPI.  Past Medical History:  Diagnosis Date   Arthritis    RA AND OA --PAIN AND SWELLIN IN LEFT KNEE   Blood in urine    NEGATIVE UROLOGY WORK UP --BUT ALWAYS HAS BLOOD IN URINE   GERD (gastroesophageal reflux disease)    H/O hiatal hernia    Hyperlipidemia    Leukemia (HCC)    PONV (postoperative nausea and vomiting)    ALWAYS SICK AFTER SURGERIES EXCEPT AFTER HIP REPLACMENT 2007   Vitamin D deficiency     Family History  Problem Relation Age of Onset   Diabetes Sister    Macular degeneration Sister    Heart attack Mother     Stroke Father    Dementia Father    Cancer Brother    Healthy Sister    Healthy Sister    Diabetes Brother     Social History:  reports that she has been smoking cigarettes. She has a 50.00 pack-year smoking history. She has never used smokeless tobacco. She reports that she does not drink alcohol and does not use drugs.   Medications Prior to Admission  Medication Sig Dispense Refill Last Dose   ALPRAZolam (XANAX) 0.25 MG tablet Take 0.25 mg by mouth at bedtime.   Past Week   HYDROcodone-acetaminophen (NORCO/VICODIN) 5-325 MG tablet Take 0.5 tablets by mouth every 6 (six) hours as needed for moderate pain.   03/13/2022   methocarbamol (ROBAXIN) 500 MG tablet Take 500 mg by mouth every 6 (six) hours as needed for muscle spasms.   Past Week   Polyvinyl Alcohol-Povidone (REFRESH OP) Place 1 drop into both eyes 4 (four) times daily as needed (dry eyes).   unk   ondansetron (ZOFRAN-ODT) 4 MG disintegrating tablet Take 1 tablet (4 mg total) by mouth every 8 (eight) hours as needed for nausea or vomiting. (Patient not taking: Reported on 03/14/2022) 20 tablet 0 Not Taking   prochlorperazine (COMPAZINE) 5 MG tablet Take 1 tablet (5 mg total) by mouth every 6 (six) hours as needed for nausea or vomiting. (Patient not taking: Reported on 03/14/2022) 30 tablet 0 Not Taking      Exam: Current vital signs:  BP 127/63 (BP Location: Right Arm)   Pulse 86   Temp 98.4 F (36.9 C) (Oral)   Resp (!) 22   Ht '5\' 1"'$  (1.549 m)   Wt 43 kg   SpO2 95%   BMI 17.91 kg/m  Vital signs in last 24 hours: Temp:  [98 F (36.7 C)-98.4 F (36.9 C)] 98.4 F (36.9 C) (01/16 1312) Pulse Rate:  [69-93] 86 (01/16 1312) Resp:  [15-24] 22 (01/16 0905) BP: (127-197)/(58-90) 127/63 (01/16 1312) SpO2:  [92 %-98 %] 95 % (01/16 1312) Weight:  [43 kg-47.9 kg] 43 kg (01/16 0905)   Physical Exam  Constitutional: Appears well-developed and well-nourished.  Psych: Affect appropriate to situation Eyes: No scleral  injection Neuro: AOx3, no aphasia, cranial nerves II to XII appear grossly intact, antigravity strength in left upper and left lower extremity without drift, antigravity strength in right upper extremity with drift, antigravity strength in right upper lower extremity with drift, FTN intact, intact to light touch  NIHSS 5  INPUTS: 1A: Level of consciousness --> 0 = Alert; keenly responsive 1B: Ask month and age --> 0 = Both questions right 1C: 'Blink eyes' & 'squeeze hands' --> 0 = Performs both tasks 2: Horizontal extraocular movements --> 0 = Normal 3: Visual fields --> 0 = No visual loss 4: Facial palsy --> 0 = Normal symmetry 5A: Left arm motor drift --> 0 = No drift for 10 seconds 5B: Right arm motor drift --> 2 = Drift, hits bed 6A: Left leg motor drift --> 0 = No drift for 5 seconds 6B: Right leg motor drift --> 2 = Drift, hits bed 7: Limb Ataxia --> 0 = No ataxia 8: Sensation --> 0 = Normal; no sensory loss 9: Language/aphasia --> 0 = Normal; no aphasia 10: Dysarthria --> 1 = Mild-moderate dysarthria: slurring but can be understood 11: Extinction/inattention --> 0 = No abnormality   I have reviewed labs in epic and the results pertinent to this consultation are: CBC:  Recent Labs  Lab 03/13/22 2113 03/14/22 0631  WBC 7.3 7.1  NEUTROABS 0.1*  --   HGB 14.0 13.7  HCT 40.1 40.0  MCV 99.0 99.0  PLT 226 829    Basic Metabolic Panel:  Lab Results  Component Value Date   NA 130 (L) 03/14/2022   K 3.7 03/14/2022   CO2 22 03/14/2022   GLUCOSE 107 (H) 03/14/2022   BUN 10 03/14/2022   CREATININE 0.48 03/14/2022   CALCIUM 8.8 (L) 03/14/2022   GFRNONAA >60 03/14/2022   GFRAA >60 12/11/2016   Lipid Panel:  Lab Results  Component Value Date   LDLCALC 98 03/13/2022   HgbA1c:  Lab Results  Component Value Date   HGBA1C 5.7 (H) 03/14/2022   Urine Drug Screen:     Component Value Date/Time   LABOPIA POSITIVE (A) 03/13/2022 2147   COCAINSCRNUR NONE DETECTED  03/13/2022 2147   LABBENZ NONE DETECTED 03/13/2022 2147   AMPHETMU NONE DETECTED 03/13/2022 2147   THCU NONE DETECTED 03/13/2022 2147   LABBARB NONE DETECTED 03/13/2022 2147    Alcohol Level     Component Value Date/Time   ETH <10 03/13/2022 2113     I have reviewed the images obtained:  CT head without contrast 03/13/2022: No acute abnormality  CTA head and neck with and without contrast 03/13/2022: No emergent large vessel occlusion or hemodynamically significant stenosis of the head or neck. Mild bilateral carotid bifurcation atherosclerosis without hemodynamically significant stenosis.  MRI brain without contrast 03/14/2022:  Acute lacunar infarct at the left thalamocapsular junction. Chronic small vessel ischemia including chronic lacunar infarcts at the left thalamus.  TTE 03/14/2022: Left ventricular ejection fraction 50 to 55% no regional wall motion abnormalities, no thrombus, no PFO    ASSESSMENT/PLAN: 82 year old female who presented with right-sided weakness.  MRI brain showed left thalamocapsular stroke.  Acute ischemic stroke -Etiology: Suspected small vessel disease  Recommendations: -Recommend aspirin 81 mg daily and Plavix and 5 mg daily for 3 weeks followed by aspirin 81 mg daily -Recommend atorvastatin 40 mg daily  -Goal blood pressure: Normotension -PT/OT -Modification of stroke risk factors -Stroke education including be passed -NIHSS per stroke protocol -Follow-up with neurology in 3 months -Discussed plan with patient and niece at bedside, Dr. [place] via secure chat  Thank you for allowing [place] to participate in the care of this patient. If you have any further questions, please contact  me or neurohospitalist.   Zeb [facility] neuro[facility]ist

## 2022-03-14 NOTE — ED Notes (Signed)
Patient transported to MRI 

## 2022-03-14 NOTE — Progress Notes (Signed)
PROGRESS NOTE    Sheri Pope  RSW:[id number] DOB: 1940-09-08 DOA: 03/13/2022 PCP: Celene Squibb, MD    Brief Narrative:   Sheri Pope is a 82 y.o. female with past medical history significant for hyperlipidemia, severe/advanced rheumatoid arthritis, osteoarthritis, history of leukemia who presented to [facility], ED on 1/15 with right-sided weakness.  Patient reports onset at roughly 11 AM.  EMS was initially activated, blood sugar was noted to be high in the 200 range and patient refused transport to the ED at that time.  In the afternoon/evening, symptoms continue to progress with increased difficulty being able to ambulate.  EMS was then reactivated patient was brought to ED for further evaluation.  In the ED, temperature 98.0 F, HR 81, RR 23, BP 162/78, SpO2 95% on room air.  WBC 7.1, hemoglobin 13.7, platelets 225.  Sodium 130, potassium 3.7, chloride 99, CO2 22, glucose 107, BUN 10, creatinine 0.48.  AST 16, ALT 15, total bilirubin 0.3.  Urinalysis unrevealing.  EtOH level less than 10.  UDS positive for opiates.  CT head without contrast with no evidence for acute intracranial abnormality, mild chronic small vessel ischemic changes of the white matter.  CT angiogram head/neck with no emergent large vessel occlusion or significantly hemodynamic stenosis of the head/neck.  TRH consulted for admission for further evaluation and management for concern for acute CVA.  Assessment & Plan:   Acute CVA Patient presenting to ED with right-sided weakness.  MR brain without contrast with acute lacunar infarct left thalamocapsular junction, small chronic vessel ischemia including chronic lacunar infarcts left thalamus.  UDS positive for opiates, otherwise unrevealing.  CT a head/neck with no large philtral occlusion.  TTE with LVEF 09-38%, grade 1 diastolic dysfunction, no aortic stenosis, no atrial level shunt detected.  LDL 98, hemoglobin A1c 5.7.  Seen by neurology with recommendations of  aspirin 81 mg p.o. daily, Plavix 75 mg p.o. daily for 3 weeks followed by aspirin alone.  Started on atorvastatin 40 mg p.o. daily.  PT/OT recommend CIR placement on discharge. -- Continue DAPT aspirin 81 mg p.o. daily, Plavix 75 mg p.o. daily x 3 weeks followed by aspirin alone -- Atorvastatin 40 mg p.o. daily -- Pending CIR evaluation -- Outpatient follow-up with neurology 3 months  Hyponatremia -- Continue IVF hydration -- BMP in a.m.  Large thyroid gland Incidental finding of heterogeneous enlarged thyroid gland on CT angiogram head/neck. -- Outpatient follow-up with PCP with nonemergent thyroid ultrasound  Severe rheumatoid arthritis/osteoarthritis -- Hydrocodone BID -- Robaxin BID  Anxiety: -- Xanax 0.25 mg p.o. nightly  Tobacco use disorder Counseled on need for complete cessation. -- Nicotine patch  DVT prophylaxis: SCDs Start: 03/14/22 0041    Code Status: DNR Family Communication: No family present at bedside this morning  Disposition Plan:  Level of care: Telemetry Status is: Inpatient Remains inpatient appropriate because: Pending CIR evaluation    Consultants:  Neurology  Procedures:  TTE  Antimicrobials:  None   Subjective: Patient seen examined bedside, resting calmly.  Remains in the ED holding area.  Discussed findings of MRI; the symptoms of right-sided weakness are related to acute stroke.  Discussed need for complete tobacco cessation.  Consulted neurology with recommendations of DAPT x 3 weeks followed by aspirin alone.  Pending CIR evaluation.  No other questions or concerns at this time.  Denies headache, no dizziness, no chest pain, no palpitations, no shortness of breath, no fever/chills/night sweats, no nausea/vomiting/diarrhea, no abdominal pain, no cough/congestion, no fatigue,  no paresthesias.  No acute events overnight per nurse staff.  Objective: Vitals:   03/14/22 0810 03/14/22 0813 03/14/22 0905 03/14/22 1312  BP:   (!) 197/90  127/63  Pulse:   86 86  Resp:   (!) 22   Temp:  98.2 F (36.8 C) 98.1 F (36.7 C) 98.4 F (36.9 C)  TempSrc:   Oral Oral  SpO2: 95%  94% 95%  Weight:   43 kg   Height:   '5\' 1"'$  (1.549 m)    No intake or output data in the 24 hours ending 03/14/22 1600 Filed Weights   03/13/22 2036 03/14/22 0905  Weight: 47.9 kg 43 kg    Examination:  Physical Exam: GEN: NAD, alert and oriented x 3, chronically ill appearance, appears older than stated age HEENT: NCAT, PERRL, EOMI, sclera clear, MMM PULM: CTAB w/o wheezes/crackles, normal respiratory effort CV: RRR w/o M/G/R GI: abd soft, NTND, NABS, no R/G/M MSK: no peripheral edema PSYCH: normal mood/affect Integumentary: dry/intact, no rashes or wounds  Neuro Exam Mental Status: A&O x4, no dysarthria, no aphasia Cranial Nerves: visual fields full, PERRL, EOMi, intact smooth pursuit, no nystagmus, no ptosis, facial sensation intact bilaterally, 5/5 jaw strength, nasolabial fold & smile symetric,  eyebrow  raise & 5/5 eye closure symetric, hearing symmetric and normal to rubbing fingers, palate elevates symmetrically, head turning and shoulder shrug intact and symetric bilaterally, tongue protrusion is midline Motor:  LUE 5/5,   LLE 5/5,   RUE 4/5,   RLE 1/5   Sensory: Sensation is intact to light touch Coordination/Movement: no tremor noted  Data Reviewed: I have personally reviewed following labs and imaging studies  CBC: Recent Labs  Lab 03/13/22 2113 03/14/22 0631  WBC 7.3 7.1  NEUTROABS 0.1*  --   HGB 14.0 13.7  HCT 40.1 40.0  MCV 99.0 99.0  PLT 226 818   Basic Metabolic Panel: Recent Labs  Lab 03/13/22 2113 03/14/22 0631  NA 129* 130*  K 3.8 3.7  CL 98 99  CO2 24 22  GLUCOSE 106* 107*  BUN 13 10  CREATININE 0.57 0.48  CALCIUM 9.0 8.8*  MG  --  2.0  PHOS  --  3.1   GFR: Estimated Creatinine Clearance: 38.7 mL/min (by C-G formula based on SCr of 0.48 mg/dL). Liver Function Tests: Recent Labs  Lab 03/13/22 2113  03/14/22 0631  AST 19 16  ALT 16 15  ALKPHOS 115 111  BILITOT 0.7 0.3  PROT 8.3* 8.1  ALBUMIN 3.5 3.3*   No results for input(s): "LIPASE", "AMYLASE" in the last 168 hours. No results for input(s): "AMMONIA" in the last 168 hours. Coagulation Profile: Recent Labs  Lab 03/13/22 2113  INR 1.0   Cardiac Enzymes: No results for input(s): "CKTOTAL", "CKMB", "CKMBINDEX", "TROPONINI" in the last 168 hours. BNP (last 3 results) No results for input(s): "PROBNP" in the last 8760 hours. HbA1C: Recent Labs    03/14/22 0631  HGBA1C 5.7*   CBG: Recent Labs  Lab 03/14/22 0007  GLUCAP 105*   Lipid Profile: Recent Labs    03/13/22 2113  CHOL 162  HDL 45  LDLCALC 98  TRIG 94  CHOLHDL 3.6   Thyroid Function Tests: No results for input(s): "TSH", "T4TOTAL", "FREET4", "T3FREE", "THYROIDAB" in the last 72 hours. Anemia Panel: No results for input(s): "VITAMINB12", "FOLATE", "FERRITIN", "TIBC", "IRON", "RETICCTPCT" in the last 72 hours. Sepsis Labs: No results for input(s): "PROCALCITON", "LATICACIDVEN" in the last 168 hours.  No results found for this  or any previous visit (from the past 240 hour(s)).       Radiology Studies: ECHOCARDIOGRAM COMPLETE  Result Date: 03/14/2022    ECHOCARDIOGRAM REPORT   Patient Name:   Sheri Pope Date of Exam: 03/14/2022 Medical Rec #:  [id number]        Height:       61.0 in Accession #:    [id number]       Weight:       94.8 lb Date of Birth:  1940/09/15         BSA:          1.375 m Patient Age:    29 years         BP:           185/82 mmHg Patient Gender: F                HR:           77 bpm. Exam Location:  [facility] Procedure: 2D Echo, Cardiac Doppler and Color Doppler Indications:    Stroke  History:        Patient has no prior history of Echocardiogram examinations.                 Stroke; Risk Factors:Current Smoker.  Sonographer:    Wenda Low Referring Phys: [id number] OLADAPO ADEFESO  Sonographer Comments: Image acquisition  challenging due to respiratory motion. IMPRESSIONS  1. Left ventricular ejection fraction, by estimation, is 50 to 55%. The left ventricle has low normal function. The left ventricle has no regional wall motion abnormalities. There is mild left ventricular hypertrophy. Left ventricular diastolic parameters are consistent with Grade I diastolic dysfunction (impaired relaxation).  2. Right ventricular systolic function is normal. The right ventricular size is normal. Tricuspid regurgitation signal is inadequate for assessing PA pressure.  3. The mitral valve is normal in structure. Trivial mitral valve regurgitation. No evidence of mitral stenosis.  4. The aortic valve is tricuspid. There is moderate calcification of the aortic valve. There is moderate thickening of the aortic valve. Aortic valve regurgitation is not visualized. No aortic stenosis is present.  5. The inferior vena cava is normal in size with greater than 50% respiratory variability, suggesting right atrial pressure of 3 mmHg. FINDINGS  Left Ventricle: Left ventricular ejection fraction, by estimation, is 50 to 55%. The left ventricle has low normal function. The left ventricle has no regional wall motion abnormalities. The left ventricular internal cavity size was normal in size. There is mild left ventricular hypertrophy. Left ventricular diastolic parameters are consistent with Grade I diastolic dysfunction (impaired relaxation). Normal left ventricular filling pressure. Right Ventricle: The right ventricular size is normal. Right vetricular wall thickness was not well visualized. Right ventricular systolic function is normal. Tricuspid regurgitation signal is inadequate for assessing PA pressure. Left Atrium: Left atrial size was normal in size. Right Atrium: Right atrial size was normal in size. Pericardium: There is no evidence of pericardial effusion. Mitral Valve: The mitral valve is normal in structure. There is mild thickening of the mitral  valve leaflet(s). There is mild calcification of the mitral valve leaflet(s). Mild mitral annular calcification. Trivial mitral valve regurgitation. No evidence  of mitral valve stenosis. MV peak gradient, 3.1 mmHg. The mean mitral valve gradient is 1.0 mmHg. Tricuspid Valve: The tricuspid valve is normal in structure. Tricuspid valve regurgitation is trivial. No evidence of tricuspid stenosis. Aortic Valve: The aortic valve is tricuspid. There is moderate calcification of the  aortic valve. There is moderate thickening of the aortic valve. There is moderate aortic valve annular calcification. Aortic valve regurgitation is not visualized. No aortic stenosis is present. Aortic valve mean gradient measures 2.0 mmHg. Aortic valve peak gradient measures 3.9 mmHg. Aortic valve area, by VTI measures 2.03 cm. Pulmonic Valve: The pulmonic valve was not well visualized. Pulmonic valve regurgitation is not visualized. No evidence of pulmonic stenosis. Aorta: The aortic root is normal in size and structure. Venous: The inferior vena cava is normal in size with greater than 50% respiratory variability, suggesting right atrial pressure of 3 mmHg. IAS/Shunts: No atrial level shunt detected by color flow Doppler.  LEFT VENTRICLE PLAX 2D LVIDd:         4.40 cm   Diastology LVIDs:         3.20 cm   LV e' medial:    6.85 cm/s LV PW:         1.10 cm   LV E/e' medial:  8.9 LV IVS:        1.10 cm   LV e' lateral:   6.53 cm/s LVOT diam:     1.90 cm   LV E/e' lateral: 9.3 LV SV:         47 LV SV Index:   34 LVOT Area:     2.84 cm  RIGHT VENTRICLE RV Basal diam:  3.35 cm RV Mid diam:    2.50 cm RV S prime:     12.50 cm/s TAPSE (M-mode): 2.2 cm LEFT ATRIUM             Index        RIGHT ATRIUM           Index LA diam:        3.30 cm 2.40 cm/m   RA Area:     13.70 cm LA Vol (A2C):   46.2 ml 33.59 ml/m  RA Volume:   36.10 ml  26.25 ml/m LA Vol (A4C):   35.8 ml 26.03 ml/m LA Biplane Vol: 41.5 ml 30.18 ml/m  AORTIC VALVE                     PULMONIC VALVE AV Area (Vmax):    2.40 cm     PV Vmax:       0.90 m/s AV Area (Vmean):   1.99 cm     PV Peak grad:  3.3 mmHg AV Area (VTI):     2.03 cm AV Vmax:           99.20 cm/s AV Vmean:          67.700 cm/s AV VTI:            0.231 m AV Peak Grad:      3.9 mmHg AV Mean Grad:      2.0 mmHg LVOT Vmax:         84.10 cm/s LVOT Vmean:        47.500 cm/s LVOT VTI:          0.165 m LVOT/AV VTI ratio: 0.71  AORTA Ao Root diam: 2.80 cm MITRAL VALVE MV Area (PHT): 3.23 cm    SHUNTS MV Area VTI:   2.15 cm    Systemic VTI:  0.16 m MV Peak grad:  3.1 mmHg    Systemic Diam: 1.90 cm MV Mean grad:  1.0 mmHg MV Vmax:       0.88 m/s MV Vmean:      48.1 cm/s MV  Decel Time: 235 msec MV E velocity: 60.70 cm/s MV A velocity: 76.80 cm/s MV E/A ratio:  0.79 Carlyle Dolly MD Electronically signed by Carlyle Dolly MD Signature Date/Time: 03/14/2022/1:32:39 PM    Final    MR BRAIN WO CONTRAST  Result Date: 03/14/2022 CLINICAL DATA:  Generalized increased weakness EXAM: MRI HEAD WITHOUT CONTRAST TECHNIQUE: Multiplanar, multiecho pulse sequences of the brain and surrounding structures were obtained without intravenous contrast. COMPARISON:  Head CT and CTA from yesterday. FINDINGS: Brain: Acute lacunar infarct at the left internal capsule/thalamus. Chronic small vessel ischemia in the cerebral white matter. Chronic lacunar infarcts in the left thalamus. No acute hemorrhage, hydrocephalus, or masslike finding. Preserved brain volume. Vascular: Preserved arterial flow voids. Patent dural sinuses on recent CTA. Skull and upper cervical spine: No focal marrow lesion. Sinuses/Orbits: No acute finding. IMPRESSION: 1. Acute lacunar infarct at the left thalamocapsular junction. 2. Chronic small vessel ischemia including chronic lacunar infarcts at the left thalamus. Electronically Signed   By: Jorje Guild M.D.   On: 03/14/2022 07:58   CT ANGIO HEAD NECK W WO CM  Result Date: 03/13/2022 CLINICAL DATA:  Stroke/TIA EXAM: CT  ANGIOGRAPHY HEAD AND NECK TECHNIQUE: Multidetector CT imaging of the head and neck was performed using the standard protocol during bolus administration of intravenous contrast. Multiplanar CT image reconstructions and MIPs were obtained to evaluate the vascular anatomy. Carotid stenosis measurements (when applicable) are obtained utilizing NASCET criteria, using the distal internal carotid diameter as the denominator. RADIATION DOSE REDUCTION: This exam was performed according to the departmental dose-optimization program which includes automated exposure control, adjustment of the mA and/or kV according to patient size and/or use of iterative reconstruction technique. CONTRAST:  51m OMNIPAQUE IOHEXOL 350 MG/ML SOLN COMPARISON:  None Available. FINDINGS: CTA NECK FINDINGS SKELETON: There is no bony spinal canal stenosis. No lytic or blastic lesion. OTHER NECK: Mildly enlarged and heterogeneous thyroid gland UPPER CHEST: Biapical paraseptal emphysema AORTIC ARCH: There is calcific atherosclerosis of the aortic arch. There is no aneurysm, dissection or hemodynamically significant stenosis of the visualized portion of the aorta. Conventional 3 vessel aortic branching pattern. The visualized proximal subclavian arteries are widely patent. RIGHT CAROTID SYSTEM: No dissection, occlusion or aneurysm. Mild atherosclerotic calcification at the carotid bifurcation without hemodynamically significant stenosis. LEFT CAROTID SYSTEM: No dissection, occlusion or aneurysm. Mild atherosclerotic calcification at the carotid bifurcation without hemodynamically significant stenosis. VERTEBRAL ARTERIES: Right dominant configuration. Both origins are clearly patent. There is no dissection, occlusion or flow-limiting stenosis to the skull base (V1-V3 segments). CTA HEAD FINDINGS POSTERIOR CIRCULATION: --Vertebral arteries: Normal V4 segments. --Inferior cerebellar arteries: Normal. --Basilar artery: Normal. --Superior cerebellar  arteries: Normal. --Posterior cerebral arteries (PCA): Normal. ANTERIOR CIRCULATION: --Intracranial internal carotid arteries: Normal. --Anterior cerebral arteries (ACA): Normal. Both A1 segments are present. Patent anterior communicating artery (a-comm). --Middle cerebral arteries (MCA): Normal. VENOUS SINUSES: As permitted by contrast timing, patent. ANATOMIC VARIANTS: None Review of the MIP images confirms the above findings. IMPRESSION: 1. No emergent large vessel occlusion or hemodynamically significant stenosis of the head or neck. 2. Mild bilateral carotid bifurcation atherosclerosis without hemodynamically significant stenosis. 3. Mildly enlarged and heterogeneous thyroid gland. Incidental heterogeneous and enlarged thyroid. Recommend non-emergent thyroid ultrasound. Reference: J Am Coll Radiol. 2015 Feb;12(2): 143-50 Aortic Atherosclerosis (ICD10-I70.0) and Emphysema (ICD10-J43.9). Electronically Signed   By: KUlyses JarredM.D.   On: 03/13/2022 23:50   CT HEAD WO CONTRAST  Result Date: 03/13/2022 CLINICAL DATA:  Generalized weakness for 3 days EXAM: CT HEAD WITHOUT  CONTRAST TECHNIQUE: Contiguous axial images were obtained from the base of the skull through the vertex without intravenous contrast. RADIATION DOSE REDUCTION: This exam was performed according to the departmental dose-optimization program which includes automated exposure control, adjustment of the mA and/or kV according to patient size and/or use of iterative reconstruction technique. COMPARISON:  None Available. FINDINGS: Brain: No acute territorial infarction, hemorrhage or intracranial mass. Probable chronic lacunar infarct left thalamus. Mild chronic small vessel ischemic changes of the white matter. Nonenlarged ventricles Vascular: No hyperdense vessels.  Carotid vascular calcification Skull: Normal. Negative for fracture or focal lesion. Sinuses/Orbits: No acute finding. Other: None IMPRESSION: 1. No CT evidence for acute intracranial  abnormality. 2. Mild chronic small vessel ischemic changes of the white matter. Electronically Signed   By: Donavan Foil M.D.   On: 03/13/2022 21:29        Scheduled Meds:  ALPRAZolam  0.25 mg Oral QHS   [START ON 03/15/2022] aspirin  81 mg Oral Daily   atorvastatin  40 mg Oral Daily   [START ON 03/15/2022] clopidogrel  75 mg Oral Daily   HYDROcodone-acetaminophen  0.5 tablet Oral BID   methocarbamol  500 mg Oral BID   nicotine  21 mg Transdermal Daily   Continuous Infusions:   LOS: 0 days    Time spent: 52 minutes spent on chart review, discussion with nursing staff, consultants, updating family and interview/physical exam; more than 50% of that time was spent in counseling and/or coordination of care.    Stacy Deshler J [place] DO [facility] Available via Epic secure chat 7am-7pm After these hours, please refer to coverage provider listed on [email or web address] 03/14/2022, 4:00 PM

## 2022-03-14 NOTE — Evaluation (Signed)
Clinical/Bedside Swallow Evaluation Patient Details  Name: Sheri Pope MRN: [id number] Date of Birth: 10-07-1940  Today's Date: 03/14/2022 Time: SLP Start Time (ACUTE ONLY): 0932 SLP Stop Time (ACUTE ONLY): 1503 SLP Time Calculation (min) (ACUTE ONLY): 20 min  Past Medical History:  Past Medical History:  Diagnosis Date   Arthritis    RA AND OA --PAIN AND SWELLIN IN LEFT KNEE   Blood in urine    NEGATIVE UROLOGY WORK UP --BUT ALWAYS HAS BLOOD IN URINE   GERD (gastroesophageal reflux disease)    H/O hiatal hernia    Hyperlipidemia    Leukemia (HCC)    PONV (postoperative nausea and vomiting)    ALWAYS SICK AFTER SURGERIES EXCEPT AFTER HIP REPLACMENT 2007   Vitamin D deficiency    Past Surgical History:  Past Surgical History:  Procedure Laterality Date   ABDOMINAL HYSTERECTOMY  1870'S   BREAST SURGERY     BREAST BIOSPIES   CATARACT EXTRACTION W/PHACO Left 05/30/2012   Procedure: CATARACT EXTRACTION PHACO AND INTRAOCULAR LENS PLACEMENT ([place]);  Surgeon: Tonny Branch, MD;  Location: [facility];  Service: Ophthalmology;  Laterality: Left;  CDE: 16.99   CATARACT EXTRACTION W/PHACO Right 06/24/2012   Procedure: CATARACT EXTRACTION PHACO AND INTRAOCULAR LENS PLACEMENT ([facility]);  Surgeon: Tonny Branch, MD;  Location: [facility];  Service: Ophthalmology;  Laterality: Right;  CDE:17.92   COLONOSCOPY WITH PROPOFOL N/A 10/14/2020   Procedure: COLONOSCOPY WITH PROPOFOL;  Surgeon: Daneil Dolin, MD;  Location: [facility];  Service: Endoscopy;  Laterality: N/A;  ASA III / 12:30   ESOPHAGOGASTRODUODENOSCOPY (EGD) WITH PROPOFOL N/A 10/14/2020   Procedure: ESOPHAGOGASTRODUODENOSCOPY (EGD) WITH PROPOFOL;  Surgeon: Daneil Dolin, MD;  Location: [facility];  Service: Endoscopy;  Laterality: N/A;   HIP SURGERY     JOINT REPLACEMENT  2007   RIGHT HIP REPLACEMENT   LEFT KNEE ARTHROSCOPY  AUG 2012   MALONEY DILATION  10/14/2020   Procedure: MALONEY DILATION;  Surgeon: Daneil Dolin, MD;  Location: [facility];  Service: Endoscopy;;   MULTIPLE ORTHOPEDIC SURGERIES     ON BOTH HANDS AND BOTH FEET, RIGHT ELBOW   SPLEENECTOMY  IN THE 70'S   FOR LARGE CYST   TOTAL KNEE ARTHROPLASTY  11/10/2011   Procedure: TOTAL KNEE ARTHROPLASTY;  Surgeon: Mcarthur Rossetti, MD;  Location: [facility];  Service: Orthopedics;  Laterality: Left;  Left Total Knee Arthroplasty   YAG LASER APPLICATION Bilateral    Dr. Geoffry Paradise   HPI:  Sheri Pope is a 82 y.o. female with medical history significant of hyperlipidemia, severe/advanced rheumatoid arthritis, osteoarthritis and history of leukemia who presented to the [facility] emergency department due to right-sided weakness. EMS was activated, blood sugar was noted to be high in the 200 range and patient refused transport to the ED previously.   In the afternoon/evening of 03/13/22,  symptoms worsened with increased difficulty in being able to ambulate and increased tendency towards falling.  Right arm and leg felt weak and she felt like she was dragging her leg a little while trying to walk.  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 BSE generated.    Assessment / Plan / Recommendation  Clinical Impression  Pt seen for clinical swallowing evaluation with grossly normal oropharyngeal swallow observed during assessment, but pt exhibiting mildly dysarthric speech and low vocal intensity during communicative interactions.  Timely swallow with adequate oral prep/propulsion and mastication observed, but limited evaluation d/t intermittent nausea.  Pt fatigued  quickly during assessment and noted R weakness in extremities, but OME unremarkable.  Pt shared she "coughs when drinking liquids/swallowing saliva occasionally and this started after completion of endoscopy to have esophagus stretched." Pt denotes GERD symptoms and was noted to belch post intake during BSE.  Recommend downgrading diet to Dysphagia 3(mechanical soft)/thin liquids for energy conservation  and to A with self-feeding d/t RA.  ST will f/u in acute setting for diet tolerance/potential SLE to assess speech, language and cognitive function.  Thank you for this consult. SLP Visit Diagnosis: Dysphagia, unspecified (R13.10)    Aspiration Risk  Mild aspiration risk    Diet Recommendation   Dysphagia 3(mechanical soft/thin liquids)  Medication Administration: Whole meds with liquid    Other  Recommendations Oral Care Recommendations: Oral care BID    Recommendations for follow up therapy are one component of a multi-disciplinary discharge planning process, led by the attending physician.  Recommendations may be updated based on patient status, additional functional criteria and insurance authorization.  Follow up Recommendations No SLP follow up      Assistance Recommended at Discharge  Intermittent  Functional Status Assessment Patient has had a recent decline in their functional status and demonstrates the ability to make significant improvements in function in a reasonable and predictable amount of time.  Frequency and Duration min 1 x/week  1 week       Prognosis Prognosis for Safe Diet Advancement: Good      Swallow Study   General Date of Onset: 03/14/22 HPI: Sheri Pope is a 82 y.o. female with medical history significant of hyperlipidemia, severe/advanced rheumatoid arthritis, osteoarthritis and history of leukemia who presented to the emergency department due to right-sided weakness. EMS was activated, blood sugar was noted to be high in the 200 range and patient refused transport to the ED at that time.  In the afternoon/evening of 03/13/22,  symptoms worsened with increased difficulty in being able to ambulate and increased tendency towards falling.  Right arm and leg felt weak and she felt like she was dragging her leg a little while trying to walk.  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 BSE generated d/t an  increase in slurred  speech. Type of Study: Bedside Swallow Evaluation Previous Swallow Assessment: n/a Diet Prior to this Study: Thin liquids;Regular Temperature Spikes Noted: No Respiratory Status: Room air History of Recent Intubation: No Behavior/Cognition: Alert;Cooperative Oral Cavity Assessment: Within Functional Limits Oral Care Completed by SLP: Other (Comment) (recent completion by pt) Oral Cavity - Dentition: Adequate natural dentition;Missing dentition Vision: Functional for self-feeding Self-Feeding Abilities: Needs assist;Needs set up Patient Positioning: Upright in bed Baseline Vocal Quality: Other (comment) (min hoarse with pt c/o xerostomia) Volitional Cough: Strong Volitional Swallow: Able to elicit    Oral/Motor/Sensory Function Overall Oral Motor/Sensory Function: Within functional limits   Ice Chips Ice chips: Not tested   Thin Liquid Thin Liquid: Within functional limits Presentation: Straw;Cup;Self Fed    Nectar Thick Nectar Thick Liquid: Not tested   Honey Thick Honey Thick Liquid: Not tested   Puree Puree: Not tested   Solid     Solid: Within functional limits Presentation: Self Fed Other Comments: pt claims since endoscopy, she "chokes on liquids/saliva occasionally"      Pat Allister Lessley,M.S., CCC-SLP 03/14/2022,6:36 PM

## 2022-03-14 NOTE — Plan of Care (Signed)
  Problem: Acute Rehab PT Goals(only PT should resolve) Goal: Pt Will Go Supine/Side To Sit Outcome: Progressing Flowsheets (Taken 03/14/2022 1412) Pt will go Supine/Side to Sit:  with modified independence  with supervision Goal: Patient Will Transfer Sit To/From Stand Outcome: Progressing Flowsheets (Taken 03/14/2022 1412) Patient will transfer sit to/from stand:  with minimal assist  with min guard assist Goal: Pt Will Transfer Bed To Chair/Chair To Bed Outcome: Progressing Flowsheets (Taken 03/14/2022 1412) Pt will Transfer Bed to Chair/Chair to Bed:  min guard assist  with min assist Goal: Pt Will Ambulate Outcome: Progressing Flowsheets (Taken 03/14/2022 1412) Pt will Ambulate:  50 feet  with minimal assist  with rolling walker   2:12 PM, 03/14/22 Lonell Grandchild, MPT Physical Therapist with [facility] [phone] office [phone] mobile phone

## 2022-03-14 NOTE — Evaluation (Signed)
Occupational Therapy Evaluation Patient Details Name: Sheri Pope MRN: [id number] DOB: 1940/05/14 Today's Date: 03/14/2022   History of Present Illness Sheri Pope is a 82 y.o. female with medical history significant of hyperlipidemia, severe/advanced rheumatoid arthritis, osteoarthritis and history of leukemia who presents to the emergency department due to right-sided weakness which started earlier today around 11 AM, EMS was activated, blood sugar was noted to be high in the 200 range and patient refused transport to the ED at that time.  In the afternoon/evening, symptoms worsened with increased difficulty in being able to ambulate and increased tendency towards falling.  Right arm and leg felt weak and she felt like she was dragging her leg a little while trying to walk.  Patient  denies slurred speech, facial droop, chest pain, shortness of breath, cough, nausea, vomiting or abdominal pain. (per DO)   Clinical Impression   Pt agreeable to OT and PT co-evaluation. Pt reports independence at baseline without AD. Pt does not drive. Pt has 24/7 support via living with her nephew who reportedly is reliant on alcohol. Pt's niece present in the room today. Today pt presents with weakness and limited shoulder A/ROM of R UE. Wrist and digits limited at baseline from severe arthritis but R was grip was substantially weaker. Pt also noted to be unsteady in standing needing RW and assist to manage it. Pt noted to drag R LE during ambulation in room and hall. At this time pt needs much assist for lower body dressing and minimal for upper body. Pt is limited functionally but R hemiparesis. Pt Sheri benefit from continued OT in the hospital and recommended venue below to increase strength, balance, and endurance for safe ADL's.        Recommendations for follow up therapy are one component of a multi-disciplinary discharge planning process, led by the attending physician.  Recommendations may be updated  based on patient status, additional functional criteria and insurance authorization.   Follow Up Recommendations  Acute inpatient rehab (3hours/day)     Assistance Recommended at Discharge Intermittent Supervision/Assistance  Patient can return home with the following A little help with walking and/or transfers;A lot of help with bathing/dressing/bathroom;Assistance with feeding;Assist for transportation;Help with stairs or ramp for entrance;Assistance with cooking/housework    Functional Status Assessment  Patient has had a recent decline in their functional status and demonstrates the ability to make significant improvements in function in a reasonable and predictable amount of time.  Equipment Recommendations  None recommended by OT    Recommendations for Other Services       Precautions / Restrictions Precautions Precautions: Fall Restrictions Weight Bearing Restrictions: No      Mobility Bed Mobility Overal bed mobility: Needs Assistance Bed Mobility: Supine to Sit     Supine to sit: Supervision     General bed mobility comments: Slow labored movement and use of bed rail.    Transfers Overall transfer level: Needs assistance Equipment used: Rolling walker (2 wheels) Transfers: Sit to/from Stand, Bed to chair/wheelchair/BSC Sit to Stand: Min assist     Step pivot transfers: Min assist     General transfer comment: Pt attempted sit to stand without walker but was very unsteady needing min to mod A . RW then used with pt at level of more min A. Same level of assist for ambulatory transfers.      Balance Overall balance assessment: Needs assistance Sitting-balance support: No upper extremity supported, Feet supported Sitting balance-Leahy Scale: Fair Sitting balance -  Comments: seated at EOB   Standing balance support: Bilateral upper extremity supported, During functional activity, Reliant on assistive device for balance Standing balance-Leahy Scale:  Poor Standing balance comment: using RW                           ADL either performed or assessed with clinical judgement   ADL Overall ADL's : Needs assistance/impaired Eating/Feeding: Minimal assistance;Sitting   Grooming: Minimal assistance;Sitting   Upper Body Bathing: Minimal assistance   Lower Body Bathing: Maximal assistance;Moderate assistance;Sitting/lateral leans   Upper Body Dressing : Minimal assistance   Lower Body Dressing: Maximal assistance;Sitting/lateral leans   Toilet Transfer: Minimal assistance;Ambulation;Rolling walker (2 wheels) Toilet Transfer Details (indicate cue type and reason): Simulated via ambulation in the hall and return to bed. Toileting- Clothing Manipulation and Hygiene: Minimal assistance;Moderate assistance;Sitting/lateral lean   Tub/ Shower Transfer: Minimal assistance;Ambulation;Rolling walker (2 wheels)   Functional mobility during ADLs: Minimal assistance;Rolling walker (2 wheels) General ADL Comments: Pt able to ambulate in hall but dragging R LE and needing assist to manage RW.     Vision Baseline Vision/History: 1 Wears glasses Ability to See in Adequate Light: 0 Adequate Patient Visual Report: No change from baseline Vision Assessment?: No apparent visual deficits                Pertinent Vitals/Pain Pain Assessment Pain Assessment: 0-10 Pain Score: 6  Pain Location: R arm and leg Pain Descriptors / Indicators: Other (Comment) ("mind of its own") Pain Intervention(s): Limited activity within patient's tolerance, Monitored during session, Repositioned     Hand Dominance Right   Extremity/Trunk Assessment Upper Extremity Assessment Upper Extremity Assessment: RUE deficits/detail;LUE deficits/detail RUE Deficits / Details: 3-/5 shoulder flexion; 4-/5 elbow flexion; 3+/5 elbow extension, poor wrist mobility at baseline due to arthritis. 2+/5 grip. Severe arthritis in B UE is a confounding variable at this  time. RUE Sensation: WNL RUE Coordination: decreased fine motor;decreased gross motor LUE Deficits / Details: Generally weak. Severe arthritis in B UE with more severity noted in wrist and digits.   Lower Extremity Assessment Lower Extremity Assessment: Defer to PT evaluation   Cervical / Trunk Assessment Cervical / Trunk Assessment: Kyphotic   Communication Communication Communication: No difficulties   Cognition Arousal/Alertness: Awake/alert Behavior During Therapy: WFL for tasks assessed/performed Overall Cognitive Status: Within Functional Limits for tasks assessed                                                        Home Living Family/patient expects to be discharged to:: Private residence Living Arrangements: Other relatives Available Help at Discharge: Family;Available 24 hours/day Type of Home: House Home Access: Ramped entrance     Home Layout: One level     Bathroom Shower/Tub: Teacher, early years/pre: Handicapped height Bathroom Accessibility: Yes How Accessible: Accessible via walker;Accessible via wheelchair Home Equipment: [place] (2 wheels);Transport chair;Cane - single point;BSC/3in1;Shower seat;Grab bars - tub/shower          Prior Functioning/Environment Prior Level of Function : Independent/Modified Independent             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 ADLs Comments: Independent ADL and IADL.        OT Problem List: Decreased strength;Decreased range of motion;Decreased  activity tolerance;Impaired balance (sitting and/or standing);Decreased coordination;Impaired UE functional use      OT Treatment/Interventions: Self-care/ADL training;Therapeutic exercise;Therapeutic activities;Patient/family education;Balance training;Neuromuscular education;DME and/or AE instruction    OT Goals(Current goals can be found in the care plan section) Acute Rehab OT  Goals Patient Stated Goal: return home OT Goal Formulation: With patient Time For Goal Achievement: 03/28/22 Potential to Achieve Goals: Good  OT Frequency: Min 2X/week    Co-evaluation PT/OT/SLP Co-Evaluation/Treatment: Yes Reason for Co-Treatment: To address functional/ADL transfers   OT goals addressed during session: ADL's and self-care      AM-PAC OT "6 Clicks" Daily Activity     Outcome Measure Help from another person eating meals?: A Little Help from another person taking care of personal grooming?: A Little Help from another person toileting, which includes using toliet, bedpan, or urinal?: A Little Help from another person bathing (including washing, rinsing, drying)?: A Lot Help from another person to put on and taking off regular upper body clothing?: A Little Help from another person to put on and taking off regular lower body clothing?: A Lot 6 Click Score: 16   End of Session Equipment Utilized During Treatment: Rolling walker (2 wheels);Gait belt  Activity Tolerance: Patient tolerated treatment well Patient left: in bed;with call bell/phone within reach;with family/visitor present  OT Visit Diagnosis: Unsteadiness on feet (R26.81);Other abnormalities of gait and mobility (R26.89);Muscle weakness (generalized) (M62.81);Other symptoms and signs involving the nervous system (R29.898);Hemiplegia and hemiparesis Hemiplegia - Right/Left: Right Hemiplegia - dominant/non-dominant: Dominant Hemiplegia - caused by: Cerebral infarction                Time: 3254-9826 OT Time Calculation (min): 22 min Charges:  OT General Charges $OT Visit: 1 Visit OT Evaluation $OT Eval Low Complexity: 1 Low  Braelynne Garinger OT, MOT   Larey Seat 03/14/2022, 12:08 PM

## 2022-03-14 NOTE — Plan of Care (Signed)
  Problem: Acute Rehab OT Goals (only OT should resolve) Goal: Pt. Will Perform Eating Flowsheets (Taken 03/14/2022 1212) Pt Will Perform Eating:  with modified independence  sitting Goal: Pt. Will Perform Grooming Flowsheets (Taken 03/14/2022 1212) Pt Will Perform Grooming:  with modified independence  sitting Goal: Pt. Will Perform Lower Body Bathing Flowsheets (Taken 03/14/2022 1212) Pt Will Perform Lower Body Bathing:  with min assist  sitting/lateral leans Goal: Pt. Will Perform Upper Body Dressing Flowsheets (Taken 03/14/2022 1212) Pt Will Perform Upper Body Dressing:  with modified independence  sitting Goal: Pt. Will Perform Lower Body Dressing Flowsheets (Taken 03/14/2022 1212) Pt Will Perform Lower Body Dressing:  with min assist  sitting/lateral leans Goal: Pt. Will Transfer To Toilet Flowsheets (Taken 03/14/2022 1212) Pt Will Transfer to Toilet:  with modified independence  ambulating Goal: Pt. Will Perform Toileting-Clothing Manipulation Flowsheets (Taken 03/14/2022 1212) Pt Will Perform Toileting - Clothing Manipulation and hygiene:  with modified independence  sitting/lateral leans Goal: Pt/Caregiver Will Perform Home Exercise Program Flowsheets (Taken 03/14/2022 1212) Pt/caregiver will Perform Home Exercise Program:  Increased ROM  Increased strength  Both right and left upper extremity  Independently  Radwan Cowley OT, MOT

## 2022-03-14 NOTE — Evaluation (Signed)
Physical Therapy Evaluation Patient Details Name: Sheri Pope MRN: [id number] DOB: 1940/09/13 Today's Date: 03/14/2022  History of Present Illness  Sheri Pope is a 82 y.o. female with medical history significant of hyperlipidemia, severe/advanced rheumatoid arthritis, osteoarthritis and history of leukemia who presents to the emergency department due to right-sided weakness which started earlier today around 11 AM, EMS was activated, blood sugar was noted to be high in the 200 range and patient refused transport to the ED at that time.  In the afternoon/evening, symptoms worsened with increased difficulty in being able to ambulate and increased tendency towards falling.  Right arm and leg felt weak and she felt like she was dragging her leg a little while trying to walk.  Patient  denies slurred speech, facial droop, chest pain, shortness of breath, cough, nausea, vomiting or abdominal pain.   Clinical Impression  Patient presents with severe RA to bilateral hands, unable to complete sit to stands without AD due to right sided weakness and poor standing balance, required use of RW demonstrating slow labored cadence with occasional dragging of RLE due to weakness, limited dorsiflexion and required increased time to make turns.  Patient tolerated sitting up at EOB with her family member present after therapy - RN aware.  Patient will benefit from continued skilled physical therapy in hospital and recommended venue below to increase strength, balance, endurance for safe ADLs and gait.         Recommendations for follow up therapy are one component of a multi-disciplinary discharge planning process, led by the attending physician.  Recommendations may be updated based on patient status, additional functional criteria and insurance authorization.  Follow Up Recommendations Acute inpatient rehab (3hours/day)      Assistance Recommended at Discharge Set up Supervision/Assistance  Patient can  return home with the following  A lot of help with bathing/dressing/bathroom;A lot of help with walking and/or transfers;Help with stairs or ramp for entrance;Assistance with cooking/housework    Equipment Recommendations None recommended by PT  Recommendations for Other Services       Functional Status Assessment Patient has had a recent decline in their functional status and demonstrates the ability to make significant improvements in function in a reasonable and predictable amount of time.     Precautions / Restrictions Precautions Precautions: Fall Restrictions Weight Bearing Restrictions: No      Mobility  Bed Mobility Overal bed mobility: Needs Assistance Bed Mobility: Supine to Sit     Supine to sit: Min guard     General bed mobility comments: increased time requiring use of bed rail    Transfers Overall transfer level: Needs assistance Equipment used: Rolling walker (2 wheels) Transfers: Sit to/from Stand, Bed to chair/wheelchair/BSC Sit to Stand: Min assist   Step pivot transfers: Min assist       General transfer comment: unable to stand without AD due to right side weakness, required use of RW for safety    Ambulation/Gait Ambulation/Gait assistance: Min assist, Mod assist Gait Distance (Feet): 30 Feet Assistive device: Rolling walker (2 wheels) Gait Pattern/deviations: Decreased step length - right, Decreased step length - left, Decreased stride length, Decreased dorsiflexion - right Gait velocity: decreased     General Gait Details: slow labored cadence with difficulty advancing RLE due weakness, required increased time for making turns using RW and limited due to fatigue  Stairs            Wheelchair Mobility    Modified Rankin (Stroke Patients Only)  Balance Overall balance assessment: Needs assistance Sitting-balance support: Feet supported, No upper extremity supported Sitting balance-Leahy Scale: Fair Sitting balance -  Comments: seated at EOB   Standing balance support: During functional activity, No upper extremity supported Standing balance-Leahy Scale: Poor Standing balance comment: fair/poor using RW                             Pertinent Vitals/Pain Pain Assessment Pain Assessment: 0-10 Pain Score: 6  Pain Location: R arm and leg Pain Descriptors / Indicators: Sore, Discomfort Pain Intervention(s): Limited activity within patient's tolerance, Monitored during session, Repositioned    Home Living Family/patient expects to be discharged to:: Private residence Living Arrangements: Other relatives Available Help at Discharge: Family;Available 24 hours/day Type of Home: House Home Access: Ramped entrance       Home Layout: One level Home Equipment: Conservation officer, nature (2 wheels);Transport chair;Cane - single point;BSC/3in1;Shower seat;Grab bars - tub/shower      Prior Function Prior Level of Function : Independent/Modified Independent             Mobility Comments: Community ambulator without AD. Does not drive. ADLs Comments: Independent ADL and IADL.     Hand Dominance   Dominant Hand: Right    Extremity/Trunk Assessment   Upper Extremity Assessment Upper Extremity Assessment: Defer to OT evaluation    Lower Extremity Assessment Lower Extremity Assessment: Generalized weakness;RLE deficits/detail RLE Deficits / Details: grossly -4/5 RLE Sensation: decreased light touch RLE Coordination: decreased gross motor    Cervical / Trunk Assessment Cervical / Trunk Assessment: Kyphotic  Communication   Communication: No difficulties  Cognition Arousal/Alertness: Awake/alert Behavior During Therapy: WFL for tasks assessed/performed Overall Cognitive Status: Within Functional Limits for tasks assessed                                          General Comments      Exercises     Assessment/Plan    PT Assessment Patient needs continued PT services   PT Problem List Decreased strength;Decreased activity tolerance;Decreased balance;Decreased mobility       PT Treatment Interventions DME instruction;Gait training;Stair training;Functional mobility training;Therapeutic activities;Therapeutic exercise;Patient/family education;Neuromuscular re-education;Balance training    PT Goals (Current goals can be found in the Care Plan section)  Acute Rehab PT Goals Patient Stated Goal: return home after rehab PT Goal Formulation: With patient/family Time For Goal Achievement: 03/28/22 Potential to Achieve Goals: Good    Frequency Min 4X/week     Co-evaluation PT/OT/SLP Co-Evaluation/Treatment: Yes Reason for Co-Treatment: To address functional/ADL transfers PT goals addressed during session: Mobility/safety with mobility;Balance;Proper use of DME         AM-PAC PT "6 Clicks" Mobility  Outcome Measure Help needed turning from your back to your side while in a flat bed without using bedrails?: A Little Help needed moving from lying on your back to sitting on the side of a flat bed without using bedrails?: A Little Help needed moving to and from a bed to a chair (including a wheelchair)?: A Lot Help needed standing up from a chair using your arms (e.g., wheelchair or bedside chair)?: A Lot Help needed to walk in hospital room?: A Lot Help needed climbing 3-5 steps with a railing? : A Lot 6 Click Score: 14    End of Session Equipment Utilized During Treatment: Gait belt Activity Tolerance: Patient  tolerated treatment well;Patient limited by fatigue Patient left: in bed;with call bell/phone within reach;with family/visitor present Nurse Communication: Mobility status PT Visit Diagnosis: Unsteadiness on feet (R26.81);Other abnormalities of gait and mobility (R26.89);Muscle weakness (generalized) (M62.81)    Time: 9892-1194 PT Time Calculation (min) (ACUTE ONLY): 20 min   Charges:   PT Evaluation $PT Eval Moderate Complexity: 1  Mod PT Treatments $Therapeutic Activity: 8-22 mins        2:11 PM, 03/14/22 Lonell Grandchild, MPT Physical Therapist with [facility] [phone] office [phone] mobile phone

## 2022-03-14 NOTE — ED Notes (Signed)
Pt. currently at MRI .  

## 2022-03-14 NOTE — ED Notes (Signed)
Purple armband placed on pt's right wrist 

## 2022-03-14 NOTE — Progress Notes (Signed)
*  PRELIMINARY RESULTS* Echocardiogram 2D Echocardiogram has been performed.  Sheri Pope 03/14/2022, 12:23 PM

## 2022-03-15 ENCOUNTER — Inpatient Hospital Stay (HOSPITAL_COMMUNITY): Payer: Medicare HMO

## 2022-03-15 DIAGNOSIS — E871 Hypo-osmolality and hyponatremia: Secondary | ICD-10-CM | POA: Diagnosis not present

## 2022-03-15 DIAGNOSIS — Z720 Tobacco use: Secondary | ICD-10-CM | POA: Diagnosis not present

## 2022-03-15 DIAGNOSIS — R531 Weakness: Secondary | ICD-10-CM | POA: Diagnosis not present

## 2022-03-15 LAB — BASIC METABOLIC PANEL
Anion gap: 6 (ref 5–15)
BUN: 12 mg/dL (ref 8–23)
CO2: 24 mmol/L (ref 22–32)
Calcium: 8.7 mg/dL — ABNORMAL LOW (ref 8.9–10.3)
Chloride: 97 mmol/L — ABNORMAL LOW (ref 98–111)
Creatinine, Ser: 0.56 mg/dL (ref 0.44–1.00)
GFR, Estimated: >60 mL/min (ref >60)
Glucose, Bld: 102 mg/dL — ABNORMAL HIGH (ref 70–99)
Potassium: 3.7 mmol/L (ref 3.5–5.1)
Sodium: 127 mmol/L — ABNORMAL LOW (ref 135–145)

## 2022-03-15 LAB — OSMOLALITY, URINE: Osmolality, Ur: 206 mOsm/kg — ABNORMAL LOW (ref 300–900)

## 2022-03-15 LAB — GLUCOSE, CAPILLARY
Glucose-Capillary: 106 mg/dL — ABNORMAL HIGH (ref 70–99)
Glucose-Capillary: 109 mg/dL — ABNORMAL HIGH (ref 70–99)

## 2022-03-15 LAB — TSH: TSH: 4.784 u[IU]/mL — ABNORMAL HIGH (ref 0.350–4.500)

## 2022-03-15 LAB — OSMOLALITY: Osmolality: 278 mOsm/kg (ref 275–295)

## 2022-03-15 MED ORDER — POLYVINYL ALCOHOL-POVIDONE PF 1.4-0.6 % OP SOLN: 1.0000 [drp] | Freq: Four times a day (QID) | OPHTHALMIC | Status: DC | PRN

## 2022-03-15 MED ORDER — SODIUM CHLORIDE 0.9 % IV SOLN: INTRAVENOUS | Status: DC

## 2022-03-15 MED ORDER — POLYVINYL ALCOHOL 1.4 % OP SOLN: 1.0000 [drp] | Freq: Four times a day (QID) | OPHTHALMIC | Status: DC | PRN

## 2022-03-15 NOTE — TOC Initial Note (Signed)
Transition of Care [facility]) - Initial/Assessment Note    Patient Details  Name: Sheri Pope MRN: [id number] Date of Birth: 10-Jun-1940  Transition of Care [facility]) CM/SW Contact:    Boneta Lucks, RN Phone Number: 03/15/2022, 1:30 PM  Clinical Narrative:    PT is recommending CIR, CIR has discussed with Family, they are agreeable to CIR. Patient will need INS AUTH and currently no beds available. MD aware TOC to follow.                Expected Discharge Plan: IP Rehab Facility Barriers to Discharge: Continued Medical Work up   Patient Goals and CMS Choice Patient states their goals for this hospitalization and ongoing recovery are:: to go to CIR CMS Medicare.gov Compare Post Acute Care list provided to:: Patient Represenative (must comment) Choice offered to / list presented to : Adult Children     Expected Discharge Plan and Services     Post Acute Care Choice: IP Rehab          Prior Living Arrangements/Services     Activities of Daily Living Home Assistive Devices/Equipment: Environmental consultant (specify type), Shower chair with back, Grab bars around toilet, Raised toilet seat with rails, Bedside commode/3-in-1 ADL Screening (condition at time of admission) Patient's cognitive ability adequate to safely complete daily activities?: Yes Is the patient deaf or have difficulty hearing?: Yes Does the patient have difficulty seeing, even when wearing glasses/contacts?: No Does the patient have difficulty concentrating, remembering, or making decisions?: No Patient able to express need for assistance with ADLs?: Yes Does the patient have difficulty dressing or bathing?: Yes Independently performs ADLs?: No Communication: Independent Dressing (OT): Needs assistance Is this a change from baseline?: Change from baseline, expected to last <3days Grooming: Needs assistance Is this a change from baseline?: Change from baseline, expected to last <3 days Feeding: Independent Bathing: Needs  assistance Is this a change from baseline?: Change from baseline, expected to last <3 days Toileting: Needs assistance Is this a change from baseline?: Change from baseline, expected to last <3 days In/Out Bed: Needs assistance Is this a change from baseline?: Change from baseline, expected to last <3 days Walks in Home: Needs assistance Is this a change from baseline?: Change from baseline, expected to last <3 days Does the patient have difficulty walking or climbing stairs?: Yes Weakness of Legs: Both Weakness of Arms/Hands: Both  Permission Sought/Granted     Emotional Assessment     Admission diagnosis:  Right sided weakness [R53.1] Patient Active Problem List   Diagnosis Date Noted   Hyponatremia 03/14/2022   Insomnia 03/14/2022   Anxiety 03/14/2022   Osteoarthritis 03/14/2022   Tobacco abuse 03/14/2022   Right sided weakness 03/13/2022   Dysphagia 03/21/2021   Rectal bleeding 08/21/2020   Rheumatoid arthritis ([place]) 08/21/2020   Leukocytosis 10/21/2019   Neutropenia ([place]) 10/21/2019   Trochanteric bursitis, left hip 12/19/2017   Degenerative arthritis of left knee 11/10/2011   PCP:  Celene Squibb, MD Pharmacy:   [facility] [address] [place] [address] Phone: [phone] Fax: [place], [address] [place] [address] Phone: [phone] Fax: [phone]     Social Determinants of Health (SDOH) Social History: [place]: No Food Insecurity (03/14/2022)  Housing: Low Risk  (03/14/2022)  Transportation Needs: No Transportation Needs (03/14/2022)  Utilities: Not At Risk (03/14/2022)  Tobacco Use: High Risk (03/13/2022)  SDOH Interventions:   Readmission Risk Interventions    03/15/2022    1:30 PM  Readmission Risk Prevention Plan  Post Dischage Appt Not Complete  Medication Screening Complete  Transportation Screening Complete

## 2022-03-15 NOTE — Progress Notes (Signed)
Occupational Therapy Treatment Patient Details Name: Sheri Pope MRN: [id number] DOB: 1940-11-10 Today's Date: 03/15/2022   History of present illness Sheri Pope is a 82 y.o. female with medical history significant of hyperlipidemia, severe/advanced rheumatoid arthritis, osteoarthritis and history of leukemia who presents to the emergency department due to right-sided weakness which started earlier today around 11 AM, EMS was activated, blood sugar was noted to be high in the 200 range and patient refused transport to the ED at that time.  In the afternoon/evening, symptoms worsened with increased difficulty in being able to ambulate and increased tendency towards falling.  Right arm and leg felt weak and she felt like she was dragging her leg a little while trying to walk.  Patient  denies slurred speech, facial droop, chest pain, shortness of breath, cough, nausea, vomiting or abdominal pain.   OT comments  Pt agreeable to OT evaluation. Pt appeared to be weaker then previous evaluation with slightly more labored effort and need for assist to sit at EOB. Pt was able to use L UE on RW but noted to use L UE to lift R UE to the RW. Pt required more moderate assist at times for ambulation forward and backward. Pt was educated to work on self range of motion of shoulder flexion while in bed. Pt is very motivated to get out of bed and progress. Pt continues to be a very good candidate for acute inpatient rehab. Pt will benefit from continued OT in the hospital and recommended venue below to increase strength, balance, and endurance for safe ADL's.      Recommendations for follow up therapy are one component of a multi-disciplinary discharge planning process, led by the attending physician.  Recommendations may be updated based on patient status, additional functional criteria and insurance authorization.    Follow Up Recommendations  Acute inpatient rehab (3hours/day)     Assistance  Recommended at Discharge Intermittent Supervision/Assistance  Patient can return home with the following  A little help with walking and/or transfers;A lot of help with bathing/dressing/bathroom;Assistance with feeding;Assist for transportation;Help with stairs or ramp for entrance;Assistance with cooking/housework   Equipment Recommendations  None recommended by OT    Recommendations for Other Services      Precautions / Restrictions Precautions Precautions: Fall Restrictions Weight Bearing Restrictions: No       Mobility Bed Mobility Overal bed mobility: Needs Assistance Bed Mobility: Supine to Sit     Supine to sit: Min assist, Mod assist     General bed mobility comments: Increased time; labored movement; two hand held assist to scoot to EOB.    Transfers Overall transfer level: Needs assistance Equipment used: Rolling walker (2 wheels) Transfers: Sit to/from Stand, Bed to chair/wheelchair/BSC Sit to Stand: Min assist     Step pivot transfers: Mod assist     General transfer comment: Partially simulated via steps from EOB with min to moderate assistance. Unsteady and difficulty coordinating R LE.     Balance Overall balance assessment: Needs assistance Sitting-balance support: Feet supported, No upper extremity supported Sitting balance-Leahy Scale: Fair Sitting balance - Comments: seated at EOB   Standing balance support: During functional activity, No upper extremity supported Standing balance-Leahy Scale: Poor Standing balance comment: poor using RW                           ADL either performed or assessed with clinical judgement   ADL  Functional mobility during ADLs: Minimal assistance;Moderate assistance;Rolling walker (2 wheels) General ADL Comments: Able to complete x2 reps of ~8 feet of ambulation forward and backwards with use of RW. Rest breaks seated at EOB between attempts.       Cognition Arousal/Alertness: Awake/alert Behavior During Therapy: WFL for tasks assessed/performed Overall Cognitive Status: Within Functional Limits for tasks assessed                                                             Pertinent Vitals/ Pain       Pain Assessment Pain Assessment: Faces Faces Pain Scale: Hurts little more Pain Location: R arm and leg Pain Descriptors / Indicators: Discomfort Pain Intervention(s): Limited activity within patient's tolerance, Monitored during session, Repositioned                                                          Frequency  Min 2X/week        Progress Toward Goals  OT Goals(current goals can now be found in the care plan section)  Progress towards OT goals: Progressing toward goals  Acute Rehab OT Goals Patient Stated Goal: return home OT Goal Formulation: With patient Time For Goal Achievement: 03/28/22 Potential to Achieve Goals: Good ADL Goals Pt Will Perform Eating: with modified independence;sitting Pt Will Perform Grooming: with modified independence;sitting Pt Will Perform Lower Body Bathing: with min assist;sitting/lateral leans Pt Will Perform Upper Body Dressing: with modified independence;sitting Pt Will Perform Lower Body Dressing: with min assist;sitting/lateral leans Pt Will Transfer to Toilet: with modified independence;ambulating Pt Will Perform Toileting - Clothing Manipulation and hygiene: with modified independence;sitting/lateral leans Pt/caregiver will Perform Home Exercise Program: Increased ROM;Increased strength;Both right and left upper extremity;Independently  Plan Discharge plan remains appropriate                                    End of Session Equipment Utilized During Treatment: Gait belt;Rolling walker (2 wheels)  OT Visit Diagnosis: Unsteadiness on feet (R26.81);Other abnormalities of gait and mobility  (R26.89);Muscle weakness (generalized) (M62.81);Other symptoms and signs involving the nervous system (R29.898);Hemiplegia and hemiparesis Hemiplegia - Right/Left: Right Hemiplegia - dominant/non-dominant: Dominant Hemiplegia - caused by: Cerebral infarction   Activity Tolerance Patient tolerated treatment well   Patient Left in bed;with call bell/phone within reach   Nurse Communication Other (comment) (NT notified to try and get pt up as much as possible.)        Time: 1030-1052 OT Time Calculation (min): 22 min  Charges: OT General Charges $OT Visit: 1 Visit OT Treatments $Therapeutic Exercise: 8-22 mins  Eliya Bubar OT, MOT   Larey Seat 03/15/2022, 12:30 PM

## 2022-03-15 NOTE — Progress Notes (Signed)
Speech Language Pathology Treatment: Dysphagia  Patient Details Name: Sheri Pope MRN: [id number] DOB: [birth year] Today's Date: 03/15/2022 Time: 0867-6195 SLP Time Calculation (min) (ACUTE ONLY): 27 min  Assessment / Plan / Recommendation Clinical Impression  Pt seen for ongoing dysphagia intervention following BSE completed yesterday. Pt reports that her swallowing is "fine", but some distaste of lunch meal (squash). She consumed straw sips thin water and self presented peanut butter crackers (mildly prolonged oral prep with solids), without signs or symptoms of aspiration and no reports of globus. Pt with very mild right labial weakness, but speech is without dysarthria. Pt states that she hopes to be discharging from the hospital soon to SNF vs CIR, as she "needs to get moving". Will d/c from SLP services at this time for dysphagia. Pt indicates that her speech/language skills are at baseline despite new stroke.     HPI HPI: Sheri Pope is a 82 y.o. female with medical history significant of hyperlipidemia, severe/advanced rheumatoid arthritis, osteoarthritis and history of leukemia who presented to the emergency department due to right-sided weakness. EMS was activated, blood sugar was noted to be high in the 200 range and patient refused transport to the ED at that time.  In the afternoon/evening of 03/13/22,  symptoms worsened with increased difficulty in being able to ambulate and increased tendency towards falling.  Right arm and leg felt weak and she felt like she was dragging her leg a little while trying to walk.  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 BSE generated d/t an  increase in slurred speech.      SLP Plan  All goals met;Discharge SLP treatment due to (comment)      Recommendations for follow up therapy are one component of a multi-disciplinary discharge planning process, led by the attending physician.  Recommendations may be updated based  on patient status, additional functional criteria and insurance authorization.    Recommendations  Diet recommendations: Dysphagia 3 (mechanical soft);Thin liquid (Can be regular textures if someone assists with cutting up foods) Liquids provided via: Cup;Straw Medication Administration: Whole meds with liquid Supervision: Patient able to self feed Postural Changes and/or Swallow Maneuvers: Seated upright 90 degrees;Upright 30-60 min after meal                Oral Care Recommendations: Oral care BID Follow Up Recommendations: Follow physician's recommendations for discharge plan and follow up therapies (D/C for dysphagia services) Assistance recommended at discharge: None SLP Visit Diagnosis: Dysphagia, unspecified (R13.10) Plan: All goals met;Discharge SLP treatment due to (comment)          Thank you,  Genene Churn, [place]  [place]  03/15/2022, 3:09 PM

## 2022-03-15 NOTE — Progress Notes (Signed)
PROGRESS NOTE    ANGELL PINCOCK  Sheri Pope DOB: 1940/05/14 DOA: 03/13/2022 PCP: Celene Squibb, MD    Brief Narrative:   Sheri Pope is a 82 y.o. female with past medical history significant for hyperlipidemia, severe/advanced rheumatoid arthritis, osteoarthritis, history of leukemia who presented to [facility], ED on 1/15 with right-sided weakness.  Patient reports onset at roughly 11 AM.  EMS was initially activated, blood sugar was noted to be high in the 200 range and patient refused transport to the ED at that time.  In the afternoon/evening, symptoms continue to progress with increased difficulty being able to ambulate.  EMS was then reactivated patient was brought to ED for further evaluation.  In the ED, temperature 98.0 F, HR 81, RR 23, BP 162/78, SpO2 95% on room air.  WBC 7.1, hemoglobin 13.7, platelets 225.  Sodium 130, potassium 3.7, chloride 99, CO2 22, glucose 107, BUN 10, creatinine 0.48.  AST 16, ALT 15, total bilirubin 0.3.  Urinalysis unrevealing.  EtOH level less than 10.  UDS positive for opiates.  CT head without contrast with no evidence for acute intracranial abnormality, mild chronic small vessel ischemic changes of the white matter.  CT angiogram head/neck with no emergent large vessel occlusion or significantly hemodynamic stenosis of the head/neck.  TRH consulted for admission for further evaluation and management for concern for acute CVA.  Assessment & Plan:   Acute CVA Patient presenting to ED with right-sided weakness.  MR brain without contrast with acute lacunar infarct left thalamocapsular junction, small chronic vessel ischemia including chronic lacunar infarcts left thalamus.  UDS positive for opiates, otherwise unrevealing.  CT a head/neck with no large philtral occlusion.  TTE with LVEF 14-97%, grade 1 diastolic dysfunction, no aortic stenosis, no atrial level shunt detected.  LDL 98, hemoglobin A1c 5.7.  Seen by neurology with recommendations of  aspirin 81 mg p.o. daily, Plavix 75 mg p.o. daily for 3 weeks followed by aspirin alone.  Started on atorvastatin 40 mg p.o. daily.  PT/OT recommend CIR placement on discharge. -- Continue DAPT aspirin 81 mg p.o. daily, Plavix 75 mg p.o. daily x 3 weeks followed by aspirin alone -- Atorvastatin 40 mg p.o. daily -- Pending CIR evaluation -- Outpatient follow-up with neurology 3 months  Hyponatremia -- Continue IVF hydration -- BMP in a.m.   Large thyroid gland Incidental finding of heterogeneous enlarged thyroid gland on CT angiogram head/neck. -- check a thyroid US to further work up   Severe rheumatoid arthritis/osteoarthritis -- Hydrocodone BID -- Robaxin BID  Anxiety: -- Xanax 0.25 mg p.o. nightly  Tobacco use disorder Counseled on need for complete cessation. -- Nicotine patch  DVT prophylaxis: SCDs Start: 03/14/22 0041    Code Status: DNR Family Communication: daughter at bedside updated 1/17  Disposition Plan:  Level of care: Telemetry Status is: Inpatient Remains inpatient appropriate because: Pending CIR evaluation   Consultants:  Neurology  Procedures:  TTE  Antimicrobials:  None  Subjective: Patient eagerly wants to get involved in more aggressive PT and agreeable to CIR.    Objective: Vitals:   03/14/22 1809 03/14/22 2118 03/15/22 0512 03/15/22 0748  BP: (!) 151/67 (!) 172/74 127/63 (!) 144/53  Pulse: 91 96 82 79  Resp:  '20 18 17  '$ Temp: 97.7 F (36.5 C) 98.4 F (36.9 C) 98.1 F (36.7 C) 98 F (36.7 C)  TempSrc: Oral Oral Oral Oral  SpO2: 96% 96% 96% 100%  Weight:      Height:  Intake/Output Summary (Last 24 hours) at 03/15/2022 1404 Last data filed at 03/15/2022 0500 Gross per 24 hour  Intake 480 ml  Output 200 ml  Net 280 ml   Filed Weights   03/13/22 2036 03/14/22 0905  Weight: 47.9 kg 43 kg    Examination:  Physical Exam: GEN: NAD, alert and oriented x 3, chronically ill appearance, appears older than stated age HEENT:  NCAT, PERRL, EOMI, sclera clear, MMM PULM: CTAB w/o wheezes/crackles, normal respiratory effort CV: RRR w/o M/G/R GI: abd soft, NTND, NABS, no R/G/M MSK: no peripheral edema PSYCH: normal mood/affect Integumentary: dry/intact, no rashes or wounds  Neuro Exam Mental Status: A&O x4, no dysarthria, no aphasia Cranial Nerves: visual fields full, PERRL, EOMi, intact smooth pursuit, no nystagmus, no ptosis, facial sensation intact bilaterally, 5/5 jaw strength, nasolabial fold & smile symetric,  eyebrow  raise & 5/5 eye closure symetric, hearing symmetric and normal to rubbing fingers, palate elevates symmetrically, head turning and shoulder shrug intact and symetric bilaterally, tongue protrusion is midline Motor:  LUE 5/5,   LLE 5/5,   RUE 4/5,   RLE 1/5   Sensory: Sensation is intact to light touch Coordination/Movement: no tremor noted  Data Reviewed: I have personally reviewed following labs and imaging studies  CBC: Recent Labs  Lab 03/13/22 2113 03/14/22 0631  WBC 7.3 7.1  NEUTROABS 0.1*  --   HGB 14.0 13.7  HCT 40.1 40.0  MCV 99.0 99.0  PLT 226 956   Basic Metabolic Panel: Recent Labs  Lab 03/13/22 2113 03/14/22 0631 03/15/22 0419  NA 129* 130* 127*  K 3.8 3.7 3.7  CL 98 99 97*  CO2 '24 22 24  '$ GLUCOSE 106* 107* 102*  BUN '13 10 12  '$ CREATININE 0.57 0.48 0.56  CALCIUM 9.0 8.8* 8.7*  MG  --  2.0  --   PHOS  --  3.1  --    GFR: Estimated Creatinine Clearance: 38.7 mL/min (by C-G formula based on SCr of 0.56 mg/dL). Liver Function Tests: Recent Labs  Lab 03/13/22 2113 03/14/22 0631  AST 19 16  ALT 16 15  ALKPHOS 115 111  BILITOT 0.7 0.3  PROT 8.3* 8.1  ALBUMIN 3.5 3.3*   No results for input(s): "LIPASE", "AMYLASE" in the last 168 hours. No results for input(s): "AMMONIA" in the last 168 hours. Coagulation Profile: Recent Labs  Lab 03/13/22 2113  INR 1.0   Cardiac Enzymes: No results for input(s): "CKTOTAL", "CKMB", "CKMBINDEX", "TROPONINI" in the last  168 hours. BNP (last 3 results) No results for input(s): "PROBNP" in the last 8760 hours. HbA1C: Recent Labs    03/14/22 0631  HGBA1C 5.7*   CBG: Recent Labs  Lab 03/14/22 0007 03/15/22 1119  GLUCAP 105* 106*   Lipid Profile: Recent Labs    03/13/22 2113  CHOL 162  HDL 45  LDLCALC 98  TRIG 94  CHOLHDL 3.6   Thyroid Function Tests: No results for input(s): "TSH", "T4TOTAL", "FREET4", "T3FREE", "THYROIDAB" in the last 72 hours. Anemia Panel: No results for input(s): "VITAMINB12", "FOLATE", "FERRITIN", "TIBC", "IRON", "RETICCTPCT" in the last 72 hours. Sepsis Labs: No results for input(s): "PROCALCITON", "LATICACIDVEN" in the last 168 hours.  No results found for this or any previous visit (from the past 240 hour(s)).    Radiology Studies: ECHOCARDIOGRAM COMPLETE  Result Date: 03/14/2022    ECHOCARDIOGRAM REPORT   Patient Name:   SHANTEL HELWIG Date of Exam: 03/14/2022 Medical Rec #:  [id number]  Height:       61.0 in Accession #:    [id number]       Weight:       94.8 lb Date of Birth:  1940/05/05         BSA:          1.375 m Patient Age:    64 years         BP:           185/82 mmHg Patient Gender: F                HR:           77 bpm. Exam Location:  [facility] Procedure: 2D Echo, Cardiac Doppler and Color Doppler Indications:    Stroke  History:        Patient has no prior history of Echocardiogram examinations.                 Stroke; Risk Factors:Current Smoker.  Sonographer:    Wenda Low Referring Phys: [id number] OLADAPO ADEFESO  Sonographer Comments: Image acquisition challenging due to respiratory motion. IMPRESSIONS  1. Left ventricular ejection fraction, by estimation, is 50 to 55%. The left ventricle has low normal function. The left ventricle has no regional wall motion abnormalities. There is mild left ventricular hypertrophy. Left ventricular diastolic parameters are consistent with Grade I diastolic dysfunction (impaired relaxation).  2. Right  ventricular systolic function is normal. The right ventricular size is normal. Tricuspid regurgitation signal is inadequate for assessing PA pressure.  3. The mitral valve is normal in structure. Trivial mitral valve regurgitation. No evidence of mitral stenosis.  4. The aortic valve is tricuspid. There is moderate calcification of the aortic valve. There is moderate thickening of the aortic valve. Aortic valve regurgitation is not visualized. No aortic stenosis is present.  5. The inferior vena cava is normal in size with greater than 50% respiratory variability, suggesting right atrial pressure of 3 mmHg. FINDINGS  Left Ventricle: Left ventricular ejection fraction, by estimation, is 50 to 55%. The left ventricle has low normal function. The left ventricle has no regional wall motion abnormalities. The left ventricular internal cavity size was normal in size. There is mild left ventricular hypertrophy. Left ventricular diastolic parameters are consistent with Grade I diastolic dysfunction (impaired relaxation). Normal left ventricular filling pressure. Right Ventricle: The right ventricular size is normal. Right vetricular wall thickness was not well visualized. Right ventricular systolic function is normal. Tricuspid regurgitation signal is inadequate for assessing PA pressure. Left Atrium: Left atrial size was normal in size. Right Atrium: Right atrial size was normal in size. Pericardium: There is no evidence of pericardial effusion. Mitral Valve: The mitral valve is normal in structure. There is mild thickening of the mitral valve leaflet(s). There is mild calcification of the mitral valve leaflet(s). Mild mitral annular calcification. Trivial mitral valve regurgitation. No evidence  of mitral valve stenosis. MV peak gradient, 3.1 mmHg. The mean mitral valve gradient is 1.0 mmHg. Tricuspid Valve: The tricuspid valve is normal in structure. Tricuspid valve regurgitation is trivial. No evidence of tricuspid  stenosis. Aortic Valve: The aortic valve is tricuspid. There is moderate calcification of the aortic valve. There is moderate thickening of the aortic valve. There is moderate aortic valve annular calcification. Aortic valve regurgitation is not visualized. No aortic stenosis is present. Aortic valve mean gradient measures 2.0 mmHg. Aortic valve peak gradient measures 3.9 mmHg. Aortic valve area, by VTI measures 2.03 cm. Pulmonic Valve: The  pulmonic valve was not well visualized. Pulmonic valve regurgitation is not visualized. No evidence of pulmonic stenosis. Aorta: The aortic root is normal in size and structure. Venous: The inferior vena cava is normal in size with greater than 50% respiratory variability, suggesting right atrial pressure of 3 mmHg. IAS/Shunts: No atrial level shunt detected by color flow Doppler.  LEFT VENTRICLE PLAX 2D LVIDd:         4.40 cm   Diastology LVIDs:         3.20 cm   LV e' medial:    6.85 cm/s LV PW:         1.10 cm   LV E/e' medial:  8.9 LV IVS:        1.10 cm   LV e' lateral:   6.53 cm/s LVOT diam:     1.90 cm   LV E/e' lateral: 9.3 LV SV:         47 LV SV Index:   34 LVOT Area:     2.84 cm  RIGHT VENTRICLE RV Basal diam:  3.35 cm RV Mid diam:    2.50 cm RV S prime:     12.50 cm/s TAPSE (M-mode): 2.2 cm LEFT ATRIUM             Index        RIGHT ATRIUM           Index LA diam:        3.30 cm 2.40 cm/m   RA Area:     13.70 cm LA Vol (A2C):   46.2 ml 33.59 ml/m  RA Volume:   36.10 ml  26.25 ml/m LA Vol (A4C):   35.8 ml 26.03 ml/m LA Biplane Vol: 41.5 ml 30.18 ml/m  AORTIC VALVE                    PULMONIC VALVE AV Area (Vmax):    2.40 cm     PV Vmax:       0.90 m/s AV Area (Vmean):   1.99 cm     PV Peak grad:  3.3 mmHg AV Area (VTI):     2.03 cm AV Vmax:           99.20 cm/s AV Vmean:          67.700 cm/s AV VTI:            0.231 m AV Peak Grad:      3.9 mmHg AV Mean Grad:      2.0 mmHg LVOT Vmax:         84.10 cm/s LVOT Vmean:        47.500 cm/s LVOT VTI:          0.165  m LVOT/AV VTI ratio: 0.71  AORTA Ao Root diam: 2.80 cm MITRAL VALVE MV Area (PHT): 3.23 cm    SHUNTS MV Area VTI:   2.15 cm    Systemic VTI:  0.16 m MV Peak grad:  3.1 mmHg    Systemic Diam: 1.90 cm MV Mean grad:  1.0 mmHg MV Vmax:       0.88 m/s MV Vmean:      48.1 cm/s MV Decel Time: 235 msec MV E velocity: 60.70 cm/s MV A velocity: 76.80 cm/s MV E/A ratio:  0.79 Carlyle Dolly MD Electronically signed by Carlyle Dolly MD Signature Date/Time: 03/14/2022/1:32:39 PM    Final    MR BRAIN WO CONTRAST  Result Date: 03/14/2022 CLINICAL DATA:  Generalized increased weakness  EXAM: MRI HEAD WITHOUT CONTRAST TECHNIQUE: Multiplanar, multiecho pulse sequences of the brain and surrounding structures were obtained without intravenous contrast. COMPARISON:  Head CT and CTA from yesterday. FINDINGS: Brain: Acute lacunar infarct at the left internal capsule/thalamus. Chronic small vessel ischemia in the cerebral white matter. Chronic lacunar infarcts in the left thalamus. No acute hemorrhage, hydrocephalus, or masslike finding. Preserved brain volume. Vascular: Preserved arterial flow voids. Patent dural sinuses on recent CTA. Skull and upper cervical spine: No focal marrow lesion. Sinuses/Orbits: No acute finding. IMPRESSION: 1. Acute lacunar infarct at the left thalamocapsular junction. 2. Chronic small vessel ischemia including chronic lacunar infarcts at the left thalamus. Electronically Signed   By: Jorje Guild M.D.   On: 03/14/2022 07:58   CT ANGIO HEAD NECK W WO CM  Result Date: 03/13/2022 CLINICAL DATA:  Stroke/TIA EXAM: CT ANGIOGRAPHY HEAD AND NECK TECHNIQUE: Multidetector CT imaging of the head and neck was performed using the standard protocol during bolus administration of intravenous contrast. Multiplanar CT image reconstructions and MIPs were obtained to evaluate the vascular anatomy. Carotid stenosis measurements (when applicable) are obtained utilizing NASCET criteria, using the distal internal  carotid diameter as the denominator. RADIATION DOSE REDUCTION: This exam was performed according to the departmental dose-optimization program which includes automated exposure control, adjustment of the mA and/or kV according to patient size and/or use of iterative reconstruction technique. CONTRAST:  71m OMNIPAQUE IOHEXOL 350 MG/ML SOLN COMPARISON:  None Available. FINDINGS: CTA NECK FINDINGS SKELETON: There is no bony spinal canal stenosis. No lytic or blastic lesion. OTHER NECK: Mildly enlarged and heterogeneous thyroid gland UPPER CHEST: Biapical paraseptal emphysema AORTIC ARCH: There is calcific atherosclerosis of the aortic arch. There is no aneurysm, dissection or hemodynamically significant stenosis of the visualized portion of the aorta. Conventional 3 vessel aortic branching pattern. The visualized proximal subclavian arteries are widely patent. RIGHT CAROTID SYSTEM: No dissection, occlusion or aneurysm. Mild atherosclerotic calcification at the carotid bifurcation without hemodynamically significant stenosis. LEFT CAROTID SYSTEM: No dissection, occlusion or aneurysm. Mild atherosclerotic calcification at the carotid bifurcation without hemodynamically significant stenosis. VERTEBRAL ARTERIES: Right dominant configuration. Both origins are clearly patent. There is no dissection, occlusion or flow-limiting stenosis to the skull base (V1-V3 segments). CTA HEAD FINDINGS POSTERIOR CIRCULATION: --Vertebral arteries: Normal V4 segments. --Inferior cerebellar arteries: Normal. --Basilar artery: Normal. --Superior cerebellar arteries: Normal. --Posterior cerebral arteries (PCA): Normal. ANTERIOR CIRCULATION: --Intracranial internal carotid arteries: Normal. --Anterior cerebral arteries (ACA): Normal. Both A1 segments are present. Patent anterior communicating artery (a-comm). --Middle cerebral arteries (MCA): Normal. VENOUS SINUSES: As permitted by contrast timing, patent. ANATOMIC VARIANTS: None Review of the  MIP images confirms the above findings. IMPRESSION: 1. No emergent large vessel occlusion or hemodynamically significant stenosis of the head or neck. 2. Mild bilateral carotid bifurcation atherosclerosis without hemodynamically significant stenosis. 3. Mildly enlarged and heterogeneous thyroid gland. Incidental heterogeneous and enlarged thyroid. Recommend non-emergent thyroid ultrasound. Reference: J Am Coll Radiol. 2015 Feb;12(2): 143-50 Aortic Atherosclerosis (ICD10-I70.0) and Emphysema (ICD10-J43.9). Electronically Signed   By: KUlyses JarredM.D.   On: 03/13/2022 23:50   CT HEAD WO CONTRAST  Result Date: 03/13/2022 CLINICAL DATA:  Generalized weakness for 3 days EXAM: CT HEAD WITHOUT CONTRAST TECHNIQUE: Contiguous axial images were obtained from the base of the skull through the vertex without intravenous contrast. RADIATION DOSE REDUCTION: This exam was performed according to the departmental dose-optimization program which includes automated exposure control, adjustment of the mA and/or kV according to patient size and/or use of iterative reconstruction technique.  COMPARISON:  None Available. FINDINGS: Brain: No acute territorial infarction, hemorrhage or intracranial mass. Probable chronic lacunar infarct left thalamus. Mild chronic small vessel ischemic changes of the white matter. Nonenlarged ventricles Vascular: No hyperdense vessels.  Carotid vascular calcification Skull: Normal. Negative for fracture or focal lesion. Sinuses/Orbits: No acute finding. Other: None IMPRESSION: 1. No CT evidence for acute intracranial abnormality. 2. Mild chronic small vessel ischemic changes of the white matter. Electronically Signed   By: Donavan Foil M.D.   On: 03/13/2022 21:29    Scheduled Meds:  ALPRAZolam  0.25 mg Oral QHS   aspirin  81 mg Oral Daily   atorvastatin  40 mg Oral Daily   clopidogrel  75 mg Oral Daily   HYDROcodone-acetaminophen  0.5 tablet Oral BID   methocarbamol  500 mg Oral BID    nicotine  21 mg Transdermal Daily   Continuous Infusions:  sodium chloride       LOS: 1 day   Time spent: 52 minutes spent on chart review, discussion with nursing staff, consultants, updating family and interview/physical exam; more than 50% of that time was spent in counseling and/or coordination of care.   Irwin Brakeman, MD [facility] Available via Epic secure chat 7am-7pm After these hours, please refer to coverage provider listed on [email or web address] 03/15/2022, 2:04 PM

## 2022-03-15 NOTE — Progress Notes (Signed)
Inpatient Rehab Admissions Coordinator:    I spoke with Pt., niece, and nephew regarding CIR admit. They are interested and her nephew is able to provide 24/7 support at d/c. I will open a case with her insurance and pursue for auth pending bed availability and insurance auth.   Clemens Catholic, [place], [place] Admissions Coordinator  [phone] ([place]) [phone] (office)

## 2022-03-15 NOTE — PMR Pre-admission (Signed)
PMR Admission Coordinator Pre-Admission Assessment  Patient: Sheri Pope is an 82 y.o., female MRN: [id number] DOB: [birth year] Height: '5\' 1"'$  (154.9 cm) Weight: 43 kg  Insurance Information HMO: yes    PPO:      PCP:      IPA:      80/20:      OTHER:  PRIMARY: [employer]       Policy#: [id number]      Subscriber: Pt CM Name:       Phone#: [phone]     Fax#: [phone]  Pt. Approved 01/16/23-01/23/23 with updates due 15/17/61 Pre-Cert#: [id number]      Employer:  Benefits:  Phone #:      Name:  Irene Shipper Date: 02/27/2022- still active   Deductible: does not have one   OOP Max: $3,600 ($10 met)   CIR: $295/day co-pay with a max co-pay of $2,065/admission (7 days)   SNF: $20/day co-pay for days 1-20, $203/day co-pay for days 21-100, limited to 100 days/cal yr   Outpatient:  $25 copay/visit Home Health:  100% coverage   DME: 80% coverage; 20% co-insurance  Providers: in network   SECONDARY:       Policy#:      Phone#:     Development worker, community:       Phone#:   The Engineer, petroleum" for patients in Inpatient Rehabilitation Facilities with attached "Privacy Act [place] Records" was provided and verbally reviewed with: Patient  Emergency Contact Information Contact Information     Name Relation Home Work Mobile   bondurant, patty Niece   [phone]       Current Medical History  Patient Admitting Diagnosis: CVA History of Present Illness:Sheri Pope is a 82 year old right-handed female with history of hyperlipidemia, severe/advanced rheumatoid arthritis and maintained on 1/2 tablet of hydrocodone 5/325 mg every 6 hours as needed, right total hip replacement 2007 and left total knee replacement 2013 and multiple surgeries to hands and feet related to rheumatoid arthritis, tobacco use as well as history of leukemia followed by Dr Derek Jack.  Per chart review lives in a 1 level home with ramped entrance.  Community ambulator  without assistive device.  She does not drive.  Independent with ADLs.  Her nephew and daughter providing support as needed.  Presented to [facility] 03/13/2022 with acute onset of right-sided weakness and slurred speech.  CT/MRI showed acute lacunar infarct at the left thalamocapsular junction as well as chronic small vessel ischemia including chronic lacunar infarcts of the left thalamus.  Patient did not receive tPA.  CT angiogram head and neck no emergent large vessel occlusion or stenosis.  Noted incidental findings of mildly enlarged and heterogeneous thyroid gland.  Ultrasound of the thyroid showed heterogeneous and borderline enlarged thyroid, compatible with medical thyroid disease/goiter and no suspicious nodules seen.  Admission chemistries unremarkable except sodium 129 glucose 106, urine drug screen positive opiates, urine osmolality 206.  Echocardiogram with ejection fraction of 50 to 55% no wall motion abnormalities grade 1 diastolic dysfunction.  Neurology follow-up currently maintained on low-dose aspirin as well as Plavix for CVA prophylaxis x 3 weeks then aspirin alone.  Hyponatremia improved with fluid restriction.  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  Therapy evaluations completed due to patient decreased functional ability right-sided weakness as well as advanced rheumatoid arthritis was admitted for a comprehensive rehab program.   Complete NIHSS TOTAL: 1  Patient's medical record from [facility]  Hospital  has been reviewed by the rehabilitation admission coordinator and physician.  Past Medical History  Past Medical History:  Diagnosis Date   Arthritis    RA AND OA --PAIN AND SWELLIN IN LEFT KNEE   Blood in urine    NEGATIVE UROLOGY WORK UP --BUT ALWAYS HAS BLOOD IN URINE   GERD (gastroesophageal reflux disease)    H/O hiatal hernia    Hyperlipidemia    Leukemia (HCC)    PONV (postoperative nausea and vomiting)     ALWAYS SICK AFTER SURGERIES EXCEPT AFTER HIP REPLACMENT 2007   Vitamin D deficiency     Has the patient had major surgery during 100 days prior to admission? No  Family History   family history includes Cancer in her brother; Dementia in her father; Diabetes in her brother and sister; Healthy in her sister and sister; Heart attack in her mother; Macular degeneration in her sister; Stroke in her father.  Current Medications  Current Facility-Administered Medications:    0.9 %  sodium chloride infusion, , Intravenous, Continuous, Johnson, Clanford L, MD   acetaminophen (TYLENOL) tablet 650 mg, 650 mg, Oral, Q6H PRN **OR** acetaminophen (TYLENOL) suppository 650 mg, 650 mg, Rectal, Q6H PRN, Adefeso, Oladapo, DO   ALPRAZolam (XANAX) tablet 0.25 mg, 0.25 mg, Oral, QHS, Adefeso, Oladapo, DO, 0.25 mg at 03/14/22 2138   [COMPLETED] aspirin tablet 325 mg, 325 mg, Oral, Once, 325 mg at 03/14/22 1104 **FOLLOWED BY** aspirin chewable tablet 81 mg, 81 mg, Oral, Daily, [place] Eric J, DO, 81 mg at 03/15/22 1110   atorvastatin (LIPITOR) tablet 40 mg, 40 mg, Oral, Daily, [place] Donnamarie Poag, DO, 40 mg at 03/15/22 1110   clopidogrel (PLAVIX) tablet 75 mg, 75 mg, Oral, Daily, Lora Havens, MD, 75 mg at 03/15/22 1110   HYDROcodone-acetaminophen (NORCO/VICODIN) 5-325 MG per tablet 0.5 tablet, 0.5 tablet, Oral, BID, Adefeso, Oladapo, DO, 0.5 tablet at 03/15/22 1110   methocarbamol (ROBAXIN) tablet 500 mg, 500 mg, Oral, BID, Adefeso, Oladapo, DO, 500 mg at 03/15/22 1110   nicotine (NICODERM CQ - dosed in mg/24 hours) patch 21 mg, 21 mg, Transdermal, Daily, [place] Eric J, DO, 21 mg at 03/15/22 1112   ondansetron (ZOFRAN) tablet 4 mg, 4 mg, Oral, Q6H PRN **OR** ondansetron (ZOFRAN) injection 4 mg, 4 mg, Intravenous, Q6H PRN, Adefeso, Oladapo, DO   polyvinyl alcohol (LIQUIFILM TEARS) 1.4 % ophthalmic solution 1 drop, 1 drop, Both Eyes, QID PRN, Wynetta Emery, Clanford L, MD  Patients Current Diet:  Diet Order              DIET DYS 3 Room service appropriate? Yes; Fluid consistency: Thin; Fluid restriction: 1500 mL Fluid  Diet effective now                   Precautions / Restrictions Precautions Precautions: Fall Restrictions Weight Bearing Restrictions: No   Has the patient had 2 or more falls or a fall with injury in the past year? Yes   Prior Activity Level Community (5-7x/wk): Pt. was acitve in the community PTA  Prior Functional Level Self Care: Did the patient need help bathing, dressing, using the toilet or eating? Independent  Indoor Mobility: Did the patient need assistance with walking from room to room (with or without device)? Independent  Stairs: Did the patient need assistance with internal or external stairs (with or without device)? Independent  Functional Cognition: Did the patient need help planning regular tasks such as shopping or remembering to take medications? Independent  Patient  Information Are you of Hispanic, Latino/a,or Spanish origin?: A. No, not of Hispanic, Latino/a, or Spanish origin What is your race?: A. White Do you need or want an interpreter to communicate with a doctor or health care staff?: 0. No  Patient's Response To:  Health Literacy and Transportation Is the patient able to respond to health literacy and transportation needs?: Yes Health Literacy - How often do you need to have someone help you when you read instructions, pamphlets, or other written material from your doctor or pharmacy?: Never In the past 12 months, has lack of transportation kept you from medical appointments or from getting medications?: No In the past 12 months, has lack of transportation kept you from meetings, work, or from getting things needed for daily living?: No  Home Assistive Devices / [place] Devices/Equipment: Environmental consultant (specify type), Shower chair with back, Grab bars around toilet, Raised toilet seat with rails, Bedside commode/3-in-1 Home  Equipment: Conservation officer, nature (2 wheels), Transport chair, [place] - single point, BSC/3in1, Civil engineer, contracting, Grab bars - tub/shower  Prior Device Use: Indicate devices/aids used by the patient prior to current illness, exacerbation or injury? None of the above  Current Functional Level Cognition  Overall Cognitive Status: Within Functional Limits for tasks assessed Orientation Level: Oriented X4    Extremity Assessment (includes Sensation/Coordination)  Upper Extremity Assessment: Defer to OT evaluation RUE Deficits / Details: 3-/5 shoulder flexion; 4-/5 elbow flexion; 3+/5 elbow extension, poor wrist mobility at baseline due to arthritis. 2+/5 grip. Severe arthritis in B UE is a confounding variable at this time. RUE Sensation: WNL RUE Coordination: decreased fine motor, decreased gross motor LUE Deficits / Details: Generally weak. Severe arthritis in B UE with more severity noted in wrist and digits.  Lower Extremity Assessment: Generalized weakness, RLE deficits/detail RLE Deficits / Details: grossly -4/5 RLE Sensation: decreased light touch RLE Coordination: decreased gross motor    ADLs  Overall ADL's : Needs assistance/impaired Eating/Feeding: Minimal assistance, Sitting Grooming: Minimal assistance, Sitting Upper Body Bathing: Minimal assistance Lower Body Bathing: Maximal assistance, Moderate assistance, Sitting/lateral leans Upper Body Dressing : Minimal assistance Lower Body Dressing: Maximal assistance, Sitting/lateral leans Toilet Transfer: Minimal assistance, Ambulation, Rolling walker (2 wheels) Toilet Transfer Details (indicate cue type and reason): Simulated via ambulation in the hall and return to bed. Toileting- Clothing Manipulation and Hygiene: Minimal assistance, Moderate assistance, Sitting/lateral lean Tub/ Shower Transfer: Minimal assistance, Ambulation, Rolling walker (2 wheels) Functional mobility during ADLs: Minimal assistance, Moderate assistance, Rolling walker (2  wheels) General ADL Comments: Able to complete x2 reps of ~8 feet of ambulation forward and backwards with use of RW. Rest breaks seated at EOB between attempts.    Mobility  Overal bed mobility: Needs Assistance Bed Mobility: Supine to Sit Supine to sit: Min assist, Mod assist General bed mobility comments: Increased time; labored movement; two hand held assist to scoot to EOB.    Transfers  Overall transfer level: Needs assistance Equipment used: Rolling walker (2 wheels) Transfers: Sit to/from Stand, Bed to chair/wheelchair/BSC Sit to Stand: Min assist Bed to/from chair/wheelchair/BSC transfer type:: Step pivot Step pivot transfers: Mod assist General transfer comment: Partially simulated via steps from EOB with min to moderate assistance. Unsteady and difficulty coordinating R LE.    Ambulation / Gait / Stairs / Wheelchair Mobility  Ambulation/Gait Ambulation/Gait assistance: Min assist, Mod assist Gait Distance (Feet): 30 Feet Assistive device: Rolling walker (2 wheels) Gait Pattern/deviations: Decreased step length - right, Decreased step length - left, Decreased stride length,  Decreased dorsiflexion - right General Gait Details: slow labored cadence with difficulty advancing RLE due weakness, required increased time for making turns using RW and limited due to fatigue Gait velocity: decreased    Posture / Balance Dynamic Sitting Balance Sitting balance - Comments: seated at EOB Balance Overall balance assessment: Needs assistance Sitting-balance support: Feet supported, No upper extremity supported Sitting balance-Leahy Scale: Fair Sitting balance - Comments: seated at EOB Standing balance support: During functional activity, No upper extremity supported Standing balance-Leahy Scale: Poor Standing balance comment: poor using RW    Special needs/care consideration Special service needs none    Previous Home Environment (from acute therapy documentation) Living  Arrangements: Other relatives Available Help at Discharge: Family, Available 24 hours/day Type of Home: House Home Layout: One level Home Access: Ramped entrance Bathroom Shower/Tub: Chiropodist: Handicapped height Bathroom Accessibility: Yes How Accessible: Accessible via walker, Accessible via wheelchair [place]: No  Discharge Living Setting Plans for Discharge Living Setting: Patient's home Type of Home at Discharge: House Discharge Home Layout: One level Discharge Home Access: [place] entrance Discharge Bathroom Shower/Tub: [place] unit Discharge Bathroom Toilet: Handicapped height Discharge Bathroom Accessibility: Yes How Accessible: Accessible via walker Does the patient have any problems obtaining your medications?: No  Social/Family/Support Systems Patient Roles: Other (Comment) Contact Information: Darnell Level (nephew) Anticipated Caregiver: [phone] Anticipated Caregiver's Contact Information: Min A, will not help with bathing or dressing. Ability/Limitations of Caregiver: 24/7 Caregiver Availability: 24/7 Discharge Plan Discussed with Primary Caregiver: Yes Is Caregiver In Agreement with Plan?: Yes Does Caregiver/Family have Issues with Lodging/Transportation while Pt is in Rehab?: Yes  Goals Patient/Family Goal for Rehab: PT Supervision, OT mod I, SLP mod I,  ELOS 14-17 days Program Orientation Provided & Reviewed with Pt/Caregiver Including Roles  & Responsibilities: Yes  Decrease burden of Care through IP rehab admission: none   Possible need for SNF placement upon discharge: not anticipated    Patient Condition: I have reviewed medical records from [facility] , spoken with CM, and patient and family member. I discussed via phone for inpatient rehabilitation assessment.  Patient will benefit from ongoing PT, OT, and SLP, can actively participate in 3 hours of therapy a day 5 days of the week, and can make measurable  gains during the admission.  Patient will also benefit from the coordinated team approach during an Inpatient Acute Rehabilitation admission.  The patient will receive intensive therapy as well as Rehabilitation physician, nursing, social worker, and care management interventions.  Due to safety, skin/wound care, disease management, medication administration, pain management, and patient education the patient requires 24 hour a day rehabilitation nursing.  The patient is currently Min A - mod A  with mobility and basic ADLs.  Discharge setting and therapy post discharge at home with home health is anticipated.  Patient has agreed to participate in the Acute Inpatient Rehabilitation Program and will admit today.  Preadmission Screen Completed By:  Genella Mech, 03/15/2022 2:18 PM ______________________________________________________________________   Discussed status with Dr. Naaman Plummer on 03/20/22 at 82  and received approval for admission today.  Admission Coordinator:  Genella Mech, CCC-SLP, time 1000/Date 01/19/23   Assessment/Plan: Diagnosis: left thalamocapsular infarct Does the need for close, 24 hr/day Medical supervision in concert with the patient's rehab needs make it unreasonable for this patient to be served in a less intensive setting? Yes Co-Morbidities requiring supervision/potential complications: RA, SIAD, leukemia Due to bladder management, bowel management, safety, skin/wound care, disease management, medication administration,  pain management, and patient education, does the patient require 24 hr/day rehab nursing? Yes Does the patient require coordinated care of a physician, rehab nurse, PT, OT, and SLP to address physical and functional deficits in the context of the above medical diagnosis(es)? Yes Addressing deficits in the following areas: balance, endurance, locomotion, strength, transferring, bowel/bladder control, bathing, dressing, feeding, grooming, toileting, cognition,  and psychosocial support Can the patient actively participate in an intensive therapy program of at least 3 hrs of therapy 5 days a week? Yes The potential for patient to make measurable gains while on inpatient rehab is excellent Anticipated functional outcomes upon discharge from inpatient rehab: supervision PT, modified independent OT, modified independent SLP Estimated rehab length of stay to reach the above functional goals is: 14-17 days Anticipated discharge destination: Home 10. Overall Rehab/Functional Prognosis: good   MD Signature: Meredith Staggers, MD, [place] Director Rehabilitation Services 03/20/2022

## 2022-03-16 DIAGNOSIS — Z720 Tobacco use: Secondary | ICD-10-CM | POA: Diagnosis not present

## 2022-03-16 DIAGNOSIS — R531 Weakness: Secondary | ICD-10-CM | POA: Diagnosis not present

## 2022-03-16 DIAGNOSIS — E871 Hypo-osmolality and hyponatremia: Secondary | ICD-10-CM | POA: Diagnosis not present

## 2022-03-16 LAB — BASIC METABOLIC PANEL
Anion gap: 6 (ref 5–15)
BUN: 10 mg/dL (ref 8–23)
CO2: 22 mmol/L (ref 22–32)
Calcium: 8.3 mg/dL — ABNORMAL LOW (ref 8.9–10.3)
Chloride: 101 mmol/L (ref 98–111)
Creatinine, Ser: 0.54 mg/dL (ref 0.44–1.00)
GFR, Estimated: >60 mL/min (ref >60)
Glucose, Bld: 105 mg/dL — ABNORMAL HIGH (ref 70–99)
Potassium: 3.3 mmol/L — ABNORMAL LOW (ref 3.5–5.1)
Sodium: 129 mmol/L — ABNORMAL LOW (ref 135–145)

## 2022-03-16 LAB — T4, FREE: Free T4: 1.39 ng/dL — ABNORMAL HIGH (ref 0.61–1.12)

## 2022-03-16 LAB — MAGNESIUM: Magnesium: 1.7 mg/dL (ref 1.7–2.4)

## 2022-03-16 MED ORDER — MAGNESIUM SULFATE 4 GM/100ML IV SOLN
4.0000 g | Freq: Once | INTRAVENOUS | Status: AC
  Administered 2022-03-16: 4 g via INTRAVENOUS
  Filled 2022-03-16: qty 100

## 2022-03-16 MED ORDER — POTASSIUM CHLORIDE CRYS ER 20 MEQ PO TBCR
40.0000 meq | EXTENDED_RELEASE_TABLET | Freq: Once | ORAL | Status: AC
  Administered 2022-03-16: 40 meq via ORAL
  Filled 2022-03-16: qty 2

## 2022-03-16 NOTE — TOC Progression Note (Signed)
Transition of Care [facility]) - Progression Note    Patient Details  Name: Sheri Pope MRN: [id number] Date of Birth: October 20, 1940  [facility] CM/SW Contact  Boneta Lucks, RN Phone Number: 03/16/2022, 3:53 PM  Clinical Narrative:   PT working with patient to keep her moving. [facility] started for CIR. They do not have beds. Patient is okay with sending out to see what SNF bed offers we can get. [facility], They do not have beds and she has not had COVID vaccines, [facility] requires vaccines. FL2 completed and sent out. PT agrees CIR would be best for patients current and chronic condition.     Expected Discharge Plan: [place] Barriers to Discharge: Continued Medical Work up  Expected Discharge Plan and Services     Post Acute Care Choice: IP Rehab   Social Determinants of Health (SDOH) Interventions SDOH Screenings   Food Insecurity: No Food Insecurity (03/14/2022)  Housing: Low Risk  (03/14/2022)  Transportation Needs: No Transportation Needs (03/14/2022)  Utilities: Not At Risk (03/14/2022)  Tobacco Use: High Risk (03/13/2022)    Readmission Risk Interventions    03/15/2022    1:30 PM  Readmission Risk Prevention Plan  Post Dischage Appt Not Complete  Medication Screening Complete  Transportation Screening Complete

## 2022-03-16 NOTE — Progress Notes (Addendum)
Pt had a difficult time getting comfortable despite many position changes and medication. Q4 neuro-checks remained stable (R sided drift, R droop, and one limb ataxia present throughout night). No difficulties with mentation and she has clear speech. She remained in NSR on monitor. NO acute events overnight. Bryson Corona Edd Fabian

## 2022-03-16 NOTE — Progress Notes (Signed)
Physical Therapy Treatment Patient Details Name: Sheri Pope MRN: [id number] DOB: 1940/08/20 Today's Date: 03/16/2022   History of Present Illness Sheri Pope is a 82 y.o. female with medical history significant of hyperlipidemia, severe/advanced rheumatoid arthritis, osteoarthritis and history of leukemia who presents to the emergency department due to right-sided weakness which started earlier today around 11 AM, EMS was activated, blood sugar was noted to be high in the 200 range and patient refused transport to the ED at that time.  In the afternoon/evening, symptoms worsened with increased difficulty in being able to ambulate and increased tendency towards falling.  Right arm and leg felt weak and she felt like she was dragging her leg a little while trying to walk.  Patient  denies slurred speech, facial droop, chest pain, shortness of breath, cough, nausea, vomiting or abdominal pain.    PT Comments    Patient agreeable and motivated for therapy.  Patient demonstrates slow labored movement for sitting up at bedside, had difficulty holding onto RW with right requiring tactile assistance to avoid sliding off, very unsteady on feet with poor balance on RLE due weakness and tolerated 2 trials of ambulating at bedside requiring 3-4 minute rest break in between trials.  Patient tolerated sitting up in chair after therapy with her niece present in room - RN notified.  Patient will benefit from continued skilled physical therapy in hospital and recommended venue below to increase strength, balance, endurance for safe ADLs and gait.      Recommendations for follow up therapy are one component of a multi-disciplinary discharge planning process, led by the attending physician.  Recommendations may be updated based on patient status, additional functional criteria and insurance authorization.  Follow Up Recommendations  Acute inpatient rehab (3hours/day)     Assistance Recommended at Discharge  Set up Supervision/Assistance  Patient can return home with the following A lot of help with bathing/dressing/bathroom;A lot of help with walking and/or transfers;Help with stairs or ramp for entrance;Assistance with cooking/housework   Equipment Recommendations  None recommended by PT    Recommendations for Other Services       Precautions / Restrictions Precautions Precautions: Fall Restrictions Weight Bearing Restrictions: No     Mobility  Bed Mobility Overal bed mobility: Needs Assistance Bed Mobility: Supine to Sit     Supine to sit: Min assist, Mod assist     General bed mobility comments: difficulty gripping with left hand, slow labored movement    Transfers Overall transfer level: Needs assistance Equipment used: Rolling walker (2 wheels) Transfers: Sit to/from Stand, Bed to chair/wheelchair/BSC Sit to Stand: Min assist   Step pivot transfers: Mod assist       General transfer comment: unsteady labored movement with difficulty balancing on RLE    Ambulation/Gait Ambulation/Gait assistance: Mod assist Gait Distance (Feet): 15 Feet Assistive device: Rolling walker (2 wheels) Gait Pattern/deviations: Decreased step length - right, Decreased step length - left, Decreased stride length, Decreased dorsiflexion - right, Antalgic, Shuffle Gait velocity: slow     General Gait Details: unsteady labored movement with diffiuclty advancing RLE due weakness and poor grip strength for holding onto RLE with right hand, required tactile assistance to hold right hand on RW   Stairs             Wheelchair Mobility    Modified Rankin (Stroke Patients Only)       Balance Overall balance assessment: Needs assistance Sitting-balance support: Feet supported, No upper extremity supported Sitting balance-Leahy Scale: Fair  Sitting balance - Comments: fair/good seated at EOB   Standing balance support: During functional activity, Reliant on assistive device for  balance, Bilateral upper extremity supported Standing balance-Leahy Scale: Poor Standing balance comment: poor using RW                            Cognition Arousal/Alertness: Awake/alert Behavior During Therapy: WFL for tasks assessed/performed Overall Cognitive Status: Within Functional Limits for tasks assessed                                          Exercises General Exercises - Lower Extremity Long Arc Quad: Seated, AROM, Strengthening, Both, 10 reps Hip Flexion/Marching: Seated, AROM, Strengthening, Both, 10 reps Toe Raises: Seated, AROM, Strengthening, Both, 10 reps Heel Raises: Seated, AROM, Strengthening, Both, 10 reps    General Comments        Pertinent Vitals/Pain Pain Assessment Pain Assessment: Faces Faces Pain Scale: Hurts a little bit Pain Location: R arm and leg Pain Descriptors / Indicators: Discomfort Pain Intervention(s): Limited activity within patient's tolerance, Monitored during session, Repositioned    Home Living                          Prior Function            PT Goals (current goals can now be found in the care plan section) Acute Rehab PT Goals Patient Stated Goal: return home after rehab PT Goal Formulation: With patient/family Time For Goal Achievement: 03/28/22 Potential to Achieve Goals: Good Progress towards PT goals: Progressing toward goals    Frequency    Min 4X/week      PT Plan Current plan remains appropriate    Co-evaluation              AM-PAC PT "6 Clicks" Mobility   Outcome Measure  Help needed turning from your back to your side while in a flat bed without using bedrails?: A Little Help needed moving from lying on your back to sitting on the side of a flat bed without using bedrails?: A Little Help needed moving to and from a bed to a chair (including a wheelchair)?: A Lot Help needed standing up from a chair using your arms (e.g., wheelchair or bedside chair)?:  A Lot Help needed to walk in hospital room?: A Lot Help needed climbing 3-5 steps with a railing? : Total 6 Click Score: 13    End of Session Equipment Utilized During Treatment: Gait belt Activity Tolerance: Patient tolerated treatment well;Patient limited by fatigue Patient left: in chair;with call bell/phone within reach;with family/visitor present Nurse Communication: Mobility status PT Visit Diagnosis: Unsteadiness on feet (R26.81);Other abnormalities of gait and mobility (R26.89);Muscle weakness (generalized) (M62.81)     Time: 8295-6213 PT Time Calculation (min) (ACUTE ONLY): 31 min  Charges:  $Gait Training: 8-22 mins $Therapeutic Exercise: 8-22 mins                     4:02 PM, 03/16/22 Lonell Grandchild, MPT Physical Therapist with [facility] [phone] office [phone] mobile phone

## 2022-03-16 NOTE — Progress Notes (Signed)
PROGRESS NOTE    Sheri Pope  HRC:[id number] DOB: 1940-11-11 DOA: 03/13/2022 PCP: Celene Squibb, MD    Brief Narrative:   Sheri Pope is a 82 y.o. female with past medical history significant for hyperlipidemia, severe/advanced rheumatoid arthritis, osteoarthritis, history of leukemia who presented to [facility] ED on 1/15 with right-sided weakness.  Patient reports onset at roughly 11 AM.  EMS was initially activated, blood sugar was noted to be high in the 200 range and patient refused transport to the ED at that time.  In the afternoon/evening, symptoms continue to progress with increased difficulty being able to ambulate.  EMS was then reactivated patient was brought to ED for further evaluation.  In the ED, temperature 98.0 F, HR 81, RR 23, BP 162/78, SpO2 95% on room air.  WBC 7.1, hemoglobin 13.7, platelets 225.  Sodium 130, potassium 3.7, chloride 99, CO2 22, glucose 107, BUN 10, creatinine 0.48.  AST 16, ALT 15, total bilirubin 0.3.  Urinalysis unrevealing.  EtOH level less than 10.  UDS positive for opiates.  CT head without contrast with no evidence for acute intracranial abnormality, mild chronic small vessel ischemic changes of the white matter.  CT angiogram head/neck with no emergent large vessel occlusion or significantly hemodynamic stenosis of the head/neck.  TRH consulted for admission for further evaluation and management for concern for acute CVA.  Assessment & Plan:   Acute CVA Patient presenting to ED with right-sided weakness.  MR brain without contrast with acute lacunar infarct left thalamocapsular junction, small chronic vessel ischemia including chronic lacunar infarcts left thalamus.  UDS positive for opiates, otherwise unrevealing.  CT a head/neck with no large philtral occlusion.  TTE with LVEF 68-03%, grade 1 diastolic dysfunction, no aortic stenosis, no atrial level shunt detected.  LDL 98, hemoglobin A1c 5.7.  Seen by neurology with recommendations of  aspirin 81 mg p.o. daily, Plavix 75 mg p.o. daily for 3 weeks followed by aspirin alone.  Started on atorvastatin 40 mg p.o. daily.  PT/OT recommend CIR placement on discharge. -- Continue DAPT aspirin 81 mg p.o. daily, Plavix 75 mg p.o. daily x 3 weeks followed by aspirin alone -- Atorvastatin 40 mg p.o. 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 -- check a thyroid US to further work up - [place] consistent with goiter, no suspicious nodules seen.    Severe rheumatoid arthritis/osteoarthritis -- Hydrocodone BID -- Robaxin BID  Anxiety: -- Xanax 0.25 mg p.o. nightly  Tobacco use disorder Counseled on need for complete cessation. -- Nicotine patch  DVT prophylaxis: SCDs Start: 03/14/22 0041    Code Status: DNR Family Communication: daughter at bedside updated 1/17  Disposition Plan:  Level of care: Telemetry Status is: Inpatient Remains inpatient appropriate because: Pending CIR evaluation   Consultants:  Neurology  Procedures:  TTE  Antimicrobials:  None  Subjective: Patient eagerly wants to get to CIR or SNF to start more intensive rehab therapy    Objective: Vitals:   03/16/22 0200 03/16/22 0600 03/16/22 0656 03/16/22 1446  BP:  (!) 150/71  (!) 160/72  Pulse:    88  Resp: 17 16 (!) 21 18  Temp:  97.7 F (36.5 C)  98 F (36.7 C)  TempSrc:  Oral  Oral  SpO2:  98%  96%  Weight:      Height:        Intake/Output Summary (Last 24 hours) at 03/16/2022 1622 Last data filed at 03/16/2022 1554 Gross per 24 hour  Intake 1223.05 ml  Output 1800 ml  Net -576.95 ml   Filed Weights   03/13/22 2036 03/14/22 0905  Weight: 47.9 kg 43 kg    Examination:  Physical Exam: GEN: NAD, alert and oriented x 3, chronically  ill appearance, appears older than stated age HEENT: NCAT, PERRL, EOMI, sclera clear, MMM PULM: CTAB w/o wheezes/crackles, normal respiratory effort CV: RRR w/o M/G/R GI: abd soft, NTND, NABS, no R/G/M MSK: severe debilitating arthritic changes esp in hands PSYCH: normal mood/affect Integumentary: dry/intact, no rashes or wounds  Neuro Exam Mental Status: A&O x4, no dysarthria, no aphasia Cranial Nerves: visual fields full, PERRL, EOMi, intact smooth pursuit, no nystagmus, no ptosis, facial sensation intact bilaterally, 5/5 jaw strength, nasolabial fold & smile symetric,  eyebrow  raise & 5/5 eye closure symetric, hearing symmetric and normal to rubbing fingers, palate elevates symmetrically, head turning and shoulder shrug intact and symetric bilaterally, tongue protrusion is midline Motor:  LUE 5/5,   LLE 5/5,   RUE 4/5,   RLE 1/5   Sensory: Sensation is intact to light touch Coordination/Movement: no tremor noted  Data Reviewed: I have personally reviewed following labs and imaging studies  CBC: Recent Labs  Lab 03/13/22 2113 03/14/22 0631  WBC 7.3 7.1  NEUTROABS 0.1*  --   HGB 14.0 13.7  HCT 40.1 40.0  MCV 99.0 99.0  PLT 226 712   Basic Metabolic Panel: Recent Labs  Lab 03/13/22 2113 03/14/22 0631 03/15/22 0419 03/16/22 0418  NA 129* 130* 127* 129*  K 3.8 3.7 3.7 3.3*  CL 98 99 97* 101  CO2 '24 22 24 22  '$ GLUCOSE 106* 107* 102* 105*  BUN '13 10 12 10  '$ CREATININE 0.57 0.48 0.56 0.54  CALCIUM 9.0 8.8* 8.7* 8.3*  MG  --  2.0  --  1.7  PHOS  --  3.1  --   --    GFR: Estimated Creatinine Clearance: 38.7 mL/min (by C-G formula based on SCr of 0.54 mg/dL). Liver Function Tests: Recent Labs  Lab 03/13/22 2113 03/14/22 0631  AST 19 16  ALT 16 15  ALKPHOS 115 111  BILITOT 0.7 0.3  PROT 8.3* 8.1  ALBUMIN 3.5 3.3*   No results for input(s): "LIPASE", "AMYLASE" in the last 168 hours. No results for input(s): "AMMONIA" in the last 168 hours. Coagulation  Profile: Recent Labs  Lab 03/13/22 2113  INR 1.0   Cardiac Enzymes: No results for input(s): "CKTOTAL", "CKMB", "CKMBINDEX", "TROPONINI" in the last 168 hours. BNP (last 3 results) No results for input(s): "PROBNP" in the last 8760 hours. HbA1C: Recent Labs    03/14/22 0631  HGBA1C 5.7*   CBG: Recent Labs  Lab 03/14/22 0007 03/15/22 1119 03/15/22 1627  GLUCAP 105* 106* 109*   Lipid Profile: Recent Labs    03/13/22 2113  CHOL 162  HDL 45  LDLCALC 98  TRIG 94  CHOLHDL 3.6   Thyroid Function Tests: Recent Labs    03/15/22 1448 03/16/22 0826  TSH 4.784*  --   FREET4  --  1.39*   Anemia Panel: No results for input(s): "VITAMINB12", "FOLATE", "FERRITIN", "TIBC", "IRON", "RETICCTPCT" in the last 72 hours. Sepsis Labs: No results for input(s): "PROCALCITON", "LATICACIDVEN" in the last 168 hours.  No results found for this or any previous  visit (from the past 240 hour(s)).    Radiology Studies: US THYROID  Result Date: 03/15/2022 CLINICAL DATA:  82 year old female with enlarged thyroid on CT EXAM: THYROID ULTRASOUND TECHNIQUE: Ultrasound examination of the thyroid gland and adjacent soft tissues was performed. COMPARISON:  CT 03/13/2022 FINDINGS: Parenchymal Echotexture: Markedly heterogenous Isthmus: 1.0 cm Right lobe: 5.2 cm x 3.0 cm x 2.4 cm Left lobe: 5.8 cm x 2.9 cm x 2.4 cm _________________________________________________________ Estimated total number of nodules >/= 1 cm: 0 Number of spongiform nodules >/=  2 cm not described below (TR1): 0 Number of mixed cystic and solid nodules >/= 1.5 cm not described below (TR2): 0 _________________________________________________________ No discrete nodules are seen within the thyroid gland. No adenopathy IMPRESSION: Heterogeneous and borderline enlarged thyroid, compatible with medical thyroid disease/goiter Electronically Signed   By: Corrie Mckusick D.O.   On: 03/15/2022 15:38    Scheduled Meds:  ALPRAZolam  0.25 mg Oral  QHS   aspirin  81 mg Oral Daily   atorvastatin  40 mg Oral Daily   clopidogrel  75 mg Oral Daily   HYDROcodone-acetaminophen  0.5 tablet Oral BID   methocarbamol  500 mg Oral BID   nicotine  21 mg Transdermal Daily   Continuous Infusions:   LOS: 2 days   Time spent: 37 minutes spent on chart review, discussion with nursing staff, consultants, updating family and interview/physical exam; more than 50% of that time was spent in counseling and/or coordination of care.   Irwin Brakeman, MD [facility] Available via Epic secure chat 7am-7pm After these hours, please refer to coverage provider listed on [email or web address] 03/16/2022, 4:22 PM

## 2022-03-16 NOTE — Plan of Care (Signed)

## 2022-03-16 NOTE — NC FL2 (Signed)
[place] LEVEL OF CARE FORM     IDENTIFICATION  Patient Name: Sheri Pope Birthdate: 1940/04/27 Sex: female Admission Date (Current Location): 03/13/2022  [facility] and [place] Number:  [employer] and Address:  [facility] [address] [place]      Provider Number: [id number]  Attending Physician Name and Address:  Murlean Iba, MD  Relative Name and Phone Number:  Bevely Palmer (Niece) [phone]    Current Level of Care: Hospital Recommended Level of Care: [place] Prior Approval Number:    Date Approved/Denied:   PASRR Number: [id number]  Discharge Plan: SNF    Current Diagnoses: Patient Active Problem List   Diagnosis Date Noted   Hyponatremia 03/14/2022   Insomnia 03/14/2022   Anxiety 03/14/2022   Osteoarthritis 03/14/2022   Tobacco abuse 03/14/2022   Right sided weakness 03/13/2022   Dysphagia 03/21/2021   Rectal bleeding 08/21/2020   Rheumatoid arthritis ([place]) 08/21/2020   Leukocytosis 10/21/2019   Neutropenia ([place]) 10/21/2019   Trochanteric bursitis, left hip 12/19/2017   Degenerative arthritis of left knee 11/10/2011    Orientation RESPIRATION BLADDER Height & Weight     Self, Time, Situation, Place  Normal External catheter Weight: 43 kg Height:  '5\' 1"'$  (154.9 cm)  BEHAVIORAL SYMPTOMS/MOOD NEUROLOGICAL BOWEL NUTRITION STATUS      Continent Diet (See DC summary)  AMBULATORY STATUS COMMUNICATION OF NEEDS Skin   Extensive Assist Verbally Normal                       Personal Care Assistance Level of Assistance  Bathing, Feeding, Dressing Bathing Assistance: Maximum assistance Feeding assistance: Maximum assistance Dressing Assistance: Maximum assistance     Functional Limitations Info  Sight, Hearing, Speech Sight Info: Adequate Hearing Info: Adequate Speech Info: Adequate    SPECIAL CARE FACTORS FREQUENCY                       Contractures  Contractures Info: Not present    Additional Factors Info  Code Status, Allergies Code Status Info: DNR Allergies Info: codeine,prenisone, sulfa, aleve, latex           Current Medications (03/16/2022):  This is the current hospital active medication list Current Facility-Administered Medications  Medication Dose Route Frequency Provider Last Rate Last Admin   acetaminophen (TYLENOL) tablet 650 mg  650 mg Oral Q6H PRN Adefeso, Oladapo, DO   325 mg at 03/16/22 0150   Or   acetaminophen (TYLENOL) suppository 650 mg  650 mg Rectal Q6H PRN Adefeso, Oladapo, DO       ALPRAZolam (XANAX) tablet 0.25 mg  0.25 mg Oral QHS Adefeso, Oladapo, DO   0.25 mg at 03/15/22 2134   aspirin chewable tablet 81 mg  81 mg Oral Daily [place] Eric J, DO   81 mg at 03/16/22 1106   atorvastatin (LIPITOR) tablet 40 mg  40 mg Oral Daily [place] Eric J, DO   40 mg at 03/16/22 1107   clopidogrel (PLAVIX) tablet 75 mg  75 mg Oral Daily Lora Havens, MD   75 mg at 03/16/22 1107   HYDROcodone-acetaminophen (NORCO/VICODIN) 5-325 MG per tablet 0.5 tablet  0.5 tablet Oral BID Adefeso, Oladapo, DO   0.5 tablet at 03/16/22 1107   methocarbamol (ROBAXIN) tablet 500 mg  500 mg Oral BID Adefeso, Oladapo, DO   500 mg at 03/16/22 1107   nicotine (NICODERM CQ - dosed in mg/24  hours) patch 21 mg  21 mg Transdermal Daily [place] Donnamarie Poag, DO   21 mg at 03/16/22 1107   ondansetron (ZOFRAN) tablet 4 mg  4 mg Oral Q6H PRN Adefeso, Oladapo, DO       Or   ondansetron (ZOFRAN) injection 4 mg  4 mg Intravenous Q6H PRN Adefeso, Oladapo, DO       polyvinyl alcohol (LIQUIFILM TEARS) 1.4 % ophthalmic solution 1 drop  1 drop Both Eyes QID PRN Murlean Iba, MD         Discharge Medications: Please see discharge summary for a list of discharge medications.  Relevant Imaging Results:  Relevant Lab Results:   Additional Information SS# [id number]  Boneta Lucks, RN

## 2022-03-17 DIAGNOSIS — E871 Hypo-osmolality and hyponatremia: Secondary | ICD-10-CM | POA: Diagnosis not present

## 2022-03-17 DIAGNOSIS — Z720 Tobacco use: Secondary | ICD-10-CM | POA: Diagnosis not present

## 2022-03-17 DIAGNOSIS — R531 Weakness: Secondary | ICD-10-CM | POA: Diagnosis not present

## 2022-03-17 LAB — MAGNESIUM: Magnesium: 2.4 mg/dL (ref 1.7–2.4)

## 2022-03-17 LAB — POTASSIUM: Potassium: 3.7 mmol/L (ref 3.5–5.1)

## 2022-03-17 MED ORDER — HYDROXYZINE HCL 25 MG PO TABS
25.0000 mg | ORAL_TABLET | Freq: Once | ORAL | Status: AC
  Administered 2022-03-17: 25 mg via ORAL
  Filled 2022-03-17: qty 1

## 2022-03-17 NOTE — Progress Notes (Signed)
Physical Therapy Treatment Patient Details Name: Sheri Pope MRN: [id number] DOB: 21-Nov-1940 Today's Date: 03/17/2022   History of Present Illness Sheri Pope is a 82 y.o. female with medical history significant of hyperlipidemia, severe/advanced rheumatoid arthritis, osteoarthritis and history of leukemia who presents to the emergency department due to right-sided weakness which started earlier today around 11 AM, EMS was activated, blood sugar was noted to be high in the 200 range and patient refused transport to the ED at that time.  In the afternoon/evening, symptoms worsened with increased difficulty in being able to ambulate and increased tendency towards falling.  Right arm and leg felt weak and she felt like she was dragging her leg a little while trying to walk.  Patient  denies slurred speech, facial droop, chest pain, shortness of breath, cough, nausea, vomiting or abdominal pain.    PT Comments    Patient seated in chair at beginning of session and is highly motivated to participate and improve function. Patient able to perform increased reps of sitting exercises today with good balance at edge of chair. She transfers to standing with RW and min g/a for LE weakness and balance deficits. Patient with increased fatigue today requiring frequent rest breaks limiting ambulation ability but does perform STS 10x with marching and increasing standing time between reps. Assist required to maintain RUE on RW and for balance/strength deficits. Then performed seated UE exercises which are tolerated well.  Patient will benefit from continued skilled physical therapy in hospital and recommended venue below to increase strength, balance, endurance for safe ADLs and gait.   Recommendations for follow up therapy are one component of a multi-disciplinary discharge planning process, led by the attending physician.  Recommendations may be updated based on patient status, additional functional criteria  and insurance authorization.  Follow Up Recommendations  Acute inpatient rehab (3hours/day)     Assistance Recommended at Discharge    Patient can return home with the following A lot of help with bathing/dressing/bathroom;A lot of help with walking and/or transfers;Help with stairs or ramp for entrance;Assistance with cooking/housework   Equipment Recommendations  None recommended by PT    Recommendations for Other Services       Precautions / Restrictions Precautions Precautions: Fall Restrictions Weight Bearing Restrictions: No     Mobility  Bed Mobility               General bed mobility comments: seated in chair at beginning of session    Transfers Overall transfer level: Needs assistance Equipment used: Rolling walker (2 wheels) Transfers: Sit to/from Stand Sit to Stand: Min assist, Min guard           General transfer comment: unsteady labored movement with difficulty balancing on RLE, STS 10 x following short seated rest breaks after standing exercises    Ambulation/Gait                   Stairs             Wheelchair Mobility    Modified Rankin (Stroke Patients Only)       Balance Overall balance assessment: Needs assistance Sitting-balance support: Feet supported, No upper extremity supported Sitting balance-Leahy Scale: Fair Sitting balance - Comments: fair/good seated at EOB   Standing balance support: During functional activity, Reliant on assistive device for balance, Bilateral upper extremity supported Standing balance-Leahy Scale: Poor Standing balance comment: poor using RW  Cognition Arousal/Alertness: Awake/alert Behavior During Therapy: WFL for tasks assessed/performed Overall Cognitive Status: Within Functional Limits for tasks assessed                                          Exercises General Exercises - Upper Extremity Shoulder Flexion: Self ROM,  AAROM, Right, 10 reps, PROM (x 10 PROM, x10 AAROM self) Shoulder ABduction: PROM, Right, 10 reps, Seated Elbow Flexion: AROM, Right, 10 reps, Seated, AAROM (AROM x 10 brachioradialis, AAROM x 10 bicep PT assist) Elbow Extension: AROM, Right, 10 reps, Seated (isometric into leg) General Exercises - Lower Extremity Long Arc Quad: Seated, AROM, Strengthening, Both, 20 reps Hip Flexion/Marching: Seated, AROM, Strengthening, Both, 20 reps (standing march 5x10 bilateral) Toe Raises: Seated, AROM, Strengthening, Both, 20 reps Heel Raises: Seated, AROM, Strengthening, Both, 20 reps    General Comments        Pertinent Vitals/Pain Pain Assessment Pain Assessment: No/denies pain    Home Living                          Prior Function            PT Goals (current goals can now be found in the care plan section) Acute Rehab PT Goals Patient Stated Goal: return home after rehab PT Goal Formulation: With patient/family Time For Goal Achievement: 03/28/22 Potential to Achieve Goals: Good Progress towards PT goals: Progressing toward goals    Frequency    Min 4X/week      PT Plan Current plan remains appropriate    Co-evaluation              AM-PAC PT "6 Clicks" Mobility   Outcome Measure  Help needed turning from your back to your side while in a flat bed without using bedrails?: A Little Help needed moving from lying on your back to sitting on the side of a flat bed without using bedrails?: A Little Help needed moving to and from a bed to a chair (including a wheelchair)?: A Lot Help needed standing up from a chair using your arms (e.g., wheelchair or bedside chair)?: A Lot Help needed to walk in hospital room?: A Lot Help needed climbing 3-5 steps with a railing? : A Lot 6 Click Score: 14    End of Session Equipment Utilized During Treatment: Gait belt Activity Tolerance: Patient tolerated treatment well;Patient limited by fatigue Patient left: in  chair;with call bell/phone within reach Nurse Communication: Mobility status PT Visit Diagnosis: Unsteadiness on feet (R26.81);Other abnormalities of gait and mobility (R26.89);Muscle weakness (generalized) (M62.81)     Time: 9622-2979 PT Time Calculation (min) (ACUTE ONLY): 34 min  Charges:  $Therapeutic Exercise: 8-22 mins $Therapeutic Activity: 8-22 mins                     12:51 PM, 03/17/22 Mearl Latin PT, DPT Physical Therapist at [place]

## 2022-03-17 NOTE — Plan of Care (Signed)

## 2022-03-17 NOTE — Progress Notes (Signed)
Inpatient Rehab Admissions Coordinator:    I did receive insurance auth for CIR. I do not have a bed today. Will follow for potential admit next week.   Clemens Catholic, [place], [place] Admissions Coordinator  [phone] ([place]) [phone] (office)

## 2022-03-17 NOTE — Care Management Important Message (Signed)
Important Message  Patient Details  Name: Sheri Pope MRN: [id number] Date of Birth: 1941-01-13   Medicare Important Message Given:  Yes     Tommy Medal 03/17/2022, 1:48 PM

## 2022-03-17 NOTE — Progress Notes (Signed)
PROGRESS NOTE    Sheri Pope  KZL:[id number] DOB: Jun 06, 1940 DOA: 03/13/2022 PCP: Celene Squibb, MD    Brief Narrative:   Sheri Pope is a 82 y.o. female with past medical history significant for hyperlipidemia, severe/advanced rheumatoid arthritis, osteoarthritis, history of leukemia who presented to [facility] ED on 1/15 with right-sided weakness.  Patient reports onset at roughly 11 AM.  EMS was initially activated, blood sugar was noted to be high in the 200 range and patient refused transport to the ED at that time.  In the afternoon/evening, symptoms continue to progress with increased difficulty being able to ambulate.  EMS was then reactivated patient was brought to ED for further evaluation.  In the ED, temperature 98.0 F, HR 81, RR 23, BP 162/78, SpO2 95% on room air.  WBC 7.1, hemoglobin 13.7, platelets 225.  Sodium 130, potassium 3.7, chloride 99, CO2 22, glucose 107, BUN 10, creatinine 0.48.  AST 16, ALT 15, total bilirubin 0.3.  Urinalysis unrevealing.  EtOH level less than 10.  UDS positive for opiates.  CT head without contrast with no evidence for acute intracranial abnormality, mild chronic small vessel ischemic changes of the white matter.  CT angiogram head/neck with no emergent large vessel occlusion or significantly hemodynamic stenosis of the head/neck.  TRH consulted for admission for further evaluation and management for concern for acute CVA.  Assessment & Plan:   Acute CVA Patient presenting to ED with right-sided weakness.  MR brain without contrast with acute lacunar infarct left thalamocapsular junction, small chronic vessel ischemia including chronic lacunar infarcts left thalamus.  UDS positive for opiates, otherwise unrevealing.  CT a head/neck with no large philtral occlusion.  TTE with LVEF 39-03%, grade 1 diastolic dysfunction, no aortic stenosis, no atrial level shunt detected.  LDL 98, hemoglobin A1c 5.7.  Seen by neurology with recommendations of  aspirin 81 mg p.o. daily, Plavix 75 mg p.o. daily for 3 weeks followed by aspirin alone.  Started on atorvastatin 40 mg p.o. daily.  PT/OT recommend CIR placement on discharge. -- Continue DAPT aspirin 81 mg p.o. daily, Plavix 75 mg p.o. daily x 3 weeks followed by aspirin alone -- Atorvastatin 40 mg p.o. 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 -- checked a thyroid US to further work up - [place] consistent with goiter, no suspicious nodules seen.    Severe rheumatoid arthritis/osteoarthritis -- Hydrocodone BID -- Robaxin BID  Anxiety: -- Xanax 0.25 mg p.o. nightly  Tobacco use disorder Counseled on need for complete cessation. -- Nicotine patch  DVT prophylaxis: SCDs Start: 03/14/22 0041    Code Status: DNR Family Communication: daughter at bedside updated 1/17  Disposition Plan:  Level of care: Telemetry Status is: Inpatient Remains inpatient appropriate because: Pending CIR evaluation   Consultants:  Neurology  Procedures:  TTE  Antimicrobials:  None  Subjective: Patient worked with hospital PT yesterday; remains eager to get into rehab    Objective: Vitals:   03/16/22 0656 03/16/22 1446 03/16/22 2213 03/17/22 0552  BP:  (!) 160/72 (!) 141/51 (!) 167/71  Pulse:  88 95 81  Resp: (!) '21 18 16 18  '$ Temp:  98 F (36.7 C) 99.4 F (37.4 C) (!) 97.3 F (36.3 C)  TempSrc:  Oral Oral   SpO2:  96% 97% 98%  Weight:      Height:        Intake/Output Summary (Last 24 hours) at 03/17/2022 1220 Last data filed at 03/17/2022 0959 Gross per 24 hour  Intake 1243.36 ml  Output 1600 ml  Net -356.64 ml   Filed Weights   03/13/22 2036 03/14/22 0905  Weight: 47.9 kg 43 kg    Examination:  Physical Exam: GEN: NAD,  alert and oriented x 3, chronically ill appearance, appears stated age HEENT: NCAT, PERRL, EOMI, sclera clear, MMM PULM: CTAB w/o wheezes/crackles, normal respiratory effort CV: RRR w/o M/G/R GI: abd soft, NTND, NABS, no R/G/M MSK: severe debilitating arthritic changes esp in hands PSYCH: normal mood/affect Integumentary: dry/intact, no rashes or wounds  Neuro Exam Mental Status: A&O x4, no dysarthria, no aphasia Cranial Nerves: visual fields full, PERRL, EOMi, intact smooth pursuit, no nystagmus, no ptosis, facial sensation intact bilaterally, 5/5 jaw strength, nasolabial fold & smile symetric,  eyebrow  raise & 5/5 eye closure symetric, hearing symmetric and normal to rubbing fingers, palate elevates symmetrically, head turning and shoulder shrug intact and symetric bilaterally, tongue protrusion is midline Motor:  LUE 5/5,   LLE 5/5,   RUE 4/5,   RLE 1/5   Sensory: Sensation is intact to light touch Coordination/Movement: no tremor noted  Data Reviewed: I have personally reviewed following labs and imaging studies  CBC: Recent Labs  Lab 03/13/22 2113 03/14/22 0631  WBC 7.3 7.1  NEUTROABS 0.1*  --   HGB 14.0 13.7  HCT 40.1 40.0  MCV 99.0 99.0  PLT 226 161   Basic Metabolic Panel: Recent Labs  Lab 03/13/22 2113 03/14/22 0631 03/15/22 0419 03/16/22 0418 03/17/22 0329  NA 129* 130* 127* 129*  --   K 3.8 3.7 3.7 3.3* 3.7  CL 98 99 97* 101  --   CO2 '24 22 24 22  '$ --   GLUCOSE 106* 107* 102* 105*  --   BUN '13 10 12 10  '$ --   CREATININE 0.57 0.48 0.56 0.54  --   CALCIUM 9.0 8.8* 8.7* 8.3*  --   MG  --  2.0  --  1.7 2.4  PHOS  --  3.1  --   --   --    GFR: Estimated Creatinine Clearance: 38.7 mL/min (by C-G formula based on SCr of 0.54 mg/dL). Liver Function Tests: Recent Labs  Lab 03/13/22 2113 03/14/22 0631  AST 19 16  ALT 16 15  ALKPHOS 115 111  BILITOT 0.7 0.3  PROT 8.3* 8.1  ALBUMIN 3.5 3.3*   No results for input(s): "LIPASE", "AMYLASE" in the last 168  hours. No results for input(s): "AMMONIA" in the last 168 hours. Coagulation Profile: Recent Labs  Lab 03/13/22 2113  INR 1.0   Cardiac Enzymes: No results for input(s): "CKTOTAL", "CKMB", "CKMBINDEX", "TROPONINI" in the last 168 hours. BNP (last 3 results) No results for input(s): "PROBNP" in the last 8760 hours. HbA1C: No results for input(s): "HGBA1C" in the last 72 hours.  CBG: Recent Labs  Lab 03/14/22 0007 03/15/22 1119 03/15/22 1627  GLUCAP 105* 106* 109*   Lipid Profile: No results for input(s): "CHOL", "HDL", "LDLCALC", "TRIG", "CHOLHDL", "LDLDIRECT" in the last 72 hours.  Thyroid Function Tests: Recent Labs    03/15/22 1448 03/16/22 0826  TSH 4.784*  --   FREET4  --  1.39*   Anemia Panel: No results for input(s): "VITAMINB12", "FOLATE", "FERRITIN", "TIBC", "IRON", "RETICCTPCT" in the last 72 hours. Sepsis Labs: No  results for input(s): "PROCALCITON", "LATICACIDVEN" in the last 168 hours.  No results found for this or any previous visit (from the past 240 hour(s)).    Radiology Studies: US THYROID  Result Date: 03/15/2022 CLINICAL DATA:  82 year old female with enlarged thyroid on CT EXAM: THYROID ULTRASOUND TECHNIQUE: Ultrasound examination of the thyroid gland and adjacent soft tissues was performed. COMPARISON:  CT 03/13/2022 FINDINGS: Parenchymal Echotexture: Markedly heterogenous Isthmus: 1.0 cm Right lobe: 5.2 cm x 3.0 cm x 2.4 cm Left lobe: 5.8 cm x 2.9 cm x 2.4 cm _________________________________________________________ Estimated total number of nodules >/= 1 cm: 0 Number of spongiform nodules >/=  2 cm not described below (TR1): 0 Number of mixed cystic and solid nodules >/= 1.5 cm not described below (TR2): 0 _________________________________________________________ No discrete nodules are seen within the thyroid gland. No adenopathy IMPRESSION: Heterogeneous and borderline enlarged thyroid, compatible with medical thyroid disease/goiter Electronically  Signed   By: Corrie Mckusick D.O.   On: 03/15/2022 15:38    Scheduled Meds:  ALPRAZolam  0.25 mg Oral QHS   aspirin  81 mg Oral Daily   atorvastatin  40 mg Oral Daily   clopidogrel  75 mg Oral Daily   HYDROcodone-acetaminophen  0.5 tablet Oral BID   methocarbamol  500 mg Oral BID   nicotine  21 mg Transdermal Daily   Continuous Infusions:   LOS: 3 days   Time spent: 35 minutes spent on chart review, discussion with nursing staff, consultants, updating family and interview/physical exam; more than 50% of that time was spent in counseling and/or coordination of care.   Irwin Brakeman, MD [facility] Available via Epic secure chat 7am-7pm After these hours, please refer to coverage provider listed on [email or web address] 03/17/2022, 12:20 PM

## 2022-03-18 DIAGNOSIS — I639 Cerebral infarction, unspecified: Secondary | ICD-10-CM | POA: Diagnosis not present

## 2022-03-18 DIAGNOSIS — E871 Hypo-osmolality and hyponatremia: Secondary | ICD-10-CM | POA: Diagnosis not present

## 2022-03-18 DIAGNOSIS — Z720 Tobacco use: Secondary | ICD-10-CM | POA: Diagnosis not present

## 2022-03-18 DIAGNOSIS — R531 Weakness: Secondary | ICD-10-CM | POA: Diagnosis not present

## 2022-03-18 NOTE — Progress Notes (Signed)
This nurse and Toy Baker, RN entered patient's room after noticing HR on telemetry monitor was reading 149 sustaining. Patient was found sitting in the recliner, reports no distress at this time. Patient reports no known history of irregular HR. As this nurse was looking at HR on telemetry, HR reading 99-102. Patient sitting in recliner with call bell in reach. Telemetry strip reviewed with Toy Baker, RN and HR showed NSR with PACs.

## 2022-03-18 NOTE — Progress Notes (Signed)
Physical Therapy Treatment Patient Details Name: Sheri Pope MRN: [id number] DOB: 1941/02/12 Today's Date: 03/18/2022   History of Present Illness Sheri Pope is a 82 y.o. female with medical history significant of hyperlipidemia, severe/advanced rheumatoid arthritis, osteoarthritis and history of leukemia who presents to the emergency department due to right-sided weakness which started earlier today around 11 AM, EMS was activated, blood sugar was noted to be high in the 200 range and patient refused transport to the ED at that time.  In the afternoon/evening, symptoms worsened with increased difficulty in being able to ambulate and increased tendency towards falling.  Right arm and leg felt weak and she felt like she was dragging her leg a little while trying to walk.  Patient  denies slurred speech, facial droop, chest pain, shortness of breath, cough, nausea, vomiting or abdominal pain.    PT Comments    Patient demonstrates increased endurance/distance for gait training followed with wheelchair for safety.  Patient has difficulty advancing RLE due to incoordination and decreased proprioception, had to take sitting rest break in w/c before walking back to room with Mod assist.  Patient tolerated sitting up in chair after therapy - nursing staff notified.  Patient will benefit from continued skilled physical therapy in hospital and recommended venue below to increase strength, balance, endurance for safe ADLs and gait.     Recommendations for follow up therapy are one component of a multi-disciplinary discharge planning process, led by the attending physician.  Recommendations may be updated based on patient status, additional functional criteria and insurance authorization.  Follow Up Recommendations  Acute inpatient rehab (3hours/day)     Assistance Recommended at Discharge Set up Supervision/Assistance  Patient can return home with the following A lot of help with  bathing/dressing/bathroom;A lot of help with walking and/or transfers;Help with stairs or ramp for entrance;Assistance with cooking/housework   Equipment Recommendations  None recommended by PT    Recommendations for Other Services       Precautions / Restrictions Precautions Precautions: Fall Restrictions Weight Bearing Restrictions: No     Mobility  Bed Mobility Overal bed mobility: Needs Assistance Bed Mobility: Supine to Sit     Supine to sit: Min assist, Mod assist     General bed mobility comments: increased time, labored movement    Transfers Overall transfer level: Needs assistance Equipment used: Rolling walker (2 wheels) Transfers: Sit to/from Stand, Bed to chair/wheelchair/BSC Sit to Stand: Min assist   Step pivot transfers: Mod assist       General transfer comment: unsteady labored movement with difficulty advancing RLE due to weakness    Ambulation/Gait Ambulation/Gait assistance: Mod assist Gait Distance (Feet): 30 Feet Assistive device: Rolling walker (2 wheels) Gait Pattern/deviations: Decreased step length - right, Decreased step length - left, Decreased stride length, Decreased dorsiflexion - right, Antalgic, Shuffle Gait velocity: decreased     General Gait Details: increased endurance/distance for taking steps with improvement for holding onto walker with right hand, had frequent incoordination of RLE, followed with w/c for safety and limited mostly due to fatigue   Stairs             Wheelchair Mobility    Modified Rankin (Stroke Patients Only)       Balance Overall balance assessment: Needs assistance Sitting-balance support: Feet supported, No upper extremity supported Sitting balance-Leahy Scale: Fair Sitting balance - Comments: fair/good seated at EOB   Standing balance support: During functional activity, Reliant on assistive device for balance, Bilateral upper extremity  supported Standing balance-Leahy Scale:  Poor Standing balance comment: using RW                            Cognition Arousal/Alertness: Awake/alert Behavior During Therapy: WFL for tasks assessed/performed Overall Cognitive Status: Within Functional Limits for tasks assessed                                          Exercises General Exercises - Lower Extremity Long Arc Quad: Seated, AROM, Strengthening, Both, 15 reps Hip Flexion/Marching: Seated, AROM, Strengthening, Both, 15 reps Toe Raises: Seated, AROM, Strengthening, Both, 15 reps Heel Raises: Seated, AROM, Strengthening, Both, 15 reps    General Comments        Pertinent Vitals/Pain Pain Assessment Pain Assessment: No/denies pain    Home Living                          Prior Function            PT Goals (current goals can now be found in the care plan section) Acute Rehab PT Goals Patient Stated Goal: return home after rehab PT Goal Formulation: With patient/family Time For Goal Achievement: 03/28/22 Potential to Achieve Goals: Good Progress towards PT goals: Progressing toward goals    Frequency    Min 4X/week      PT Plan Current plan remains appropriate    Co-evaluation              AM-PAC PT "6 Clicks" Mobility   Outcome Measure  Help needed turning from your back to your side while in a flat bed without using bedrails?: A Little Help needed moving from lying on your back to sitting on the side of a flat bed without using bedrails?: A Little Help needed moving to and from a bed to a chair (including a wheelchair)?: A Lot Help needed standing up from a chair using your arms (e.g., wheelchair or bedside chair)?: A Lot Help needed to walk in hospital room?: A Lot Help needed climbing 3-5 steps with a railing? : A Lot 6 Click Score: 14    End of Session Equipment Utilized During Treatment: Gait belt Activity Tolerance: Patient tolerated treatment well;Patient limited by fatigue Patient left:  in chair;with call bell/phone within reach Nurse Communication: Mobility status PT Visit Diagnosis: Unsteadiness on feet (R26.81);Other abnormalities of gait and mobility (R26.89);Muscle weakness (generalized) (M62.81)     Time: 1583-0940 PT Time Calculation (min) (ACUTE ONLY): 30 min  Charges:  $Gait Training: 8-22 mins $Therapeutic Exercise: 8-22 mins                     1:28 PM, 03/18/22 Lonell Grandchild, MPT Physical Therapist with [facility] [phone] office [phone] mobile phone

## 2022-03-18 NOTE — Progress Notes (Signed)
PROGRESS NOTE    VIRGENE TIRONE  WPY:[id number] DOB: 02-17-1941 DOA: 03/13/2022 PCP: Celene Squibb, MD    Brief Narrative:   Sheri Pope is a 82 y.o. female with past medical history significant for hyperlipidemia, severe/advanced rheumatoid arthritis, osteoarthritis, history of leukemia who presented to [facility] ED on 1/15 with right-sided weakness.  Patient reports onset at roughly 11 AM.  EMS was initially activated, blood sugar was noted to be high in the 200 range and patient refused transport to the ED at that time.  In the afternoon/evening, symptoms continue to progress with increased difficulty being able to ambulate.  EMS was then reactivated patient was brought to ED for further evaluation.  In the ED, temperature 98.0 F, HR 81, RR 23, BP 162/78, SpO2 95% on room air.  WBC 7.1, hemoglobin 13.7, platelets 225.  Sodium 130, potassium 3.7, chloride 99, CO2 22, glucose 107, BUN 10, creatinine 0.48.  AST 16, ALT 15, total bilirubin 0.3.  Urinalysis unrevealing.  EtOH level less than 10.  UDS positive for opiates.  CT head without contrast with no evidence for acute intracranial abnormality, mild chronic small vessel ischemic changes of the white matter.  CT angiogram head/neck with no emergent large vessel occlusion or significantly hemodynamic stenosis of the head/neck.  TRH consulted for admission for further evaluation and management for concern for acute CVA.  Assessment & Plan:   Acute CVA Patient presenting to ED with right-sided weakness.  MR brain without contrast with acute lacunar infarct left thalamocapsular junction, small chronic vessel ischemia including chronic lacunar infarcts left thalamus.  UDS positive for opiates, otherwise unrevealing.  CT a head/neck with no large philtral occlusion.  TTE with LVEF 05-39%, grade 1 diastolic dysfunction, no aortic stenosis, no atrial level shunt detected.  LDL 98, hemoglobin A1c 5.7.  Seen by neurology with recommendations of  aspirin 81 mg p.o. daily, Plavix 75 mg p.o. daily for 3 weeks followed by aspirin alone.  Started on atorvastatin 40 mg p.o. daily.  PT/OT recommend CIR placement on discharge. -- Continue DAPT aspirin 81 mg p.o. daily, Plavix 75 mg p.o. daily x 3 weeks followed by aspirin alone -- Atorvastatin 40 mg p.o. 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 -- checked a thyroid US to further work up - [place] consistent with goiter, no suspicious nodules seen.    Severe rheumatoid arthritis/osteoarthritis -- Hydrocodone BID -- Robaxin BID  Anxiety: -- Xanax 0.25 mg p.o. nightly  Tobacco use disorder Counseled on need for complete cessation. -- Nicotine patch  DVT prophylaxis: SCDs Start: 03/14/22 0041    Code Status: DNR Family Communication: daughter at bedside updated 1/17  Disposition Plan:  Level of care: Telemetry Status is: Inpatient Remains inpatient appropriate because: Pending CIR evaluation   Consultants:  Neurology  Procedures:  TTE  Antimicrobials:  None  Subjective: Worked with PT yesterday; eager to get to CIR as soon as possible; upset about having to wait for a CIR bed.   Objective: Vitals:   03/16/22 2213 03/17/22 0552 03/17/22 1404 03/17/22 2041  BP: (!) 141/51 (!) 167/71 136/62 (!) 170/65  Pulse: 95 81 86 98  Resp: '16 18 20 18  '$ Temp: 99.4 F (37.4 C) (!) 97.3 F (36.3 C) 98.5 F (36.9 C) 99.3  F (37.4 C)  TempSrc: Oral  Oral Oral  SpO2: 97% 98% 95% 97%  Weight:      Height:        Intake/Output Summary (Last 24 hours) at 03/18/2022 1242 Last data filed at 03/18/2022 0900 Gross per 24 hour  Intake 960 ml  Output 1500 ml  Net -540 ml   Filed Weights   03/13/22 2036 03/14/22 0905  Weight: 47.9 kg  43 kg    Examination:  Physical Exam: GEN: NAD, alert and oriented x 3, chronically ill appearance, appears stated age HEENT: NCAT, PERRL, EOMI, sclera clear, MMM PULM: CTAB w/o wheezes/crackles, normal respiratory effort CV: RRR w/o M/G/R GI: abd soft, NTND, NABS, no R/G/M MSK: severe debilitating arthritic changes esp in hands PSYCH: normal mood/affect Integumentary: dry/intact, no rashes or wounds  Neuro Exam Mental Status: A&O x4, no dysarthria, no aphasia Cranial Nerves: visual fields full, PERRL, EOMi, intact smooth pursuit, no nystagmus, no ptosis, facial sensation intact bilaterally, 5/5 jaw strength, nasolabial fold & smile symetric,  eyebrow  raise & 5/5 eye closure symetric, hearing symmetric and normal to rubbing fingers, palate elevates symmetrically, head turning and shoulder shrug intact and symetric bilaterally, tongue protrusion is midline Motor:  LUE 5/5,   LLE 5/5,   RUE 4/5,   RLE 1/5   Sensory: Sensation is intact to light touch Coordination/Movement: no tremor noted  Data Reviewed: I have personally reviewed following labs and imaging studies  CBC: Recent Labs  Lab 03/13/22 2113 03/14/22 0631  WBC 7.3 7.1  NEUTROABS 0.1*  --   HGB 14.0 13.7  HCT 40.1 40.0  MCV 99.0 99.0  PLT 226 425   Basic Metabolic Panel: Recent Labs  Lab 03/13/22 2113 03/14/22 0631 03/15/22 0419 03/16/22 0418 03/17/22 0329  NA 129* 130* 127* 129*  --   K 3.8 3.7 3.7 3.3* 3.7  CL 98 99 97* 101  --   CO2 '24 22 24 22  '$ --   GLUCOSE 106* 107* 102* 105*  --   BUN '13 10 12 10  '$ --   CREATININE 0.57 0.48 0.56 0.54  --   CALCIUM 9.0 8.8* 8.7* 8.3*  --   MG  --  2.0  --  1.7 2.4  PHOS  --  3.1  --   --   --    GFR: Estimated Creatinine Clearance: 38.7 mL/min (by C-G formula based on SCr of 0.54 mg/dL). Liver Function Tests: Recent Labs  Lab 03/13/22 2113 03/14/22 0631  AST 19 16  ALT 16 15  ALKPHOS 115 111  BILITOT 0.7 0.3  PROT 8.3* 8.1  ALBUMIN 3.5 3.3*   No results  for input(s): "LIPASE", "AMYLASE" in the last 168 hours. No results for input(s): "AMMONIA" in the last 168 hours. Coagulation Profile: Recent Labs  Lab 03/13/22 2113  INR 1.0   Cardiac Enzymes: No results for input(s): "CKTOTAL", "CKMB", "CKMBINDEX", "TROPONINI" in the last 168 hours. BNP (last 3 results) No results for input(s): "PROBNP" in the last 8760 hours. HbA1C: No results for input(s): "HGBA1C" in the last 72 hours.  CBG: Recent Labs  Lab 03/14/22 0007 03/15/22 1119 03/15/22 1627  GLUCAP 105* 106* 109*   Lipid Profile: No results for input(s): "CHOL", "HDL", "LDLCALC", "TRIG", "CHOLHDL", "LDLDIRECT" in the last 72 hours.  Thyroid Function Tests: Recent Labs    03/15/22 1448 03/16/22 0826  TSH 4.784*  --   FREET4  --  1.39*   Anemia Panel: No results for input(s): "VITAMINB12", "FOLATE", "  FERRITIN", "TIBC", "IRON", "RETICCTPCT" in the last 72 hours. Sepsis Labs: No results for input(s): "PROCALCITON", "LATICACIDVEN" in the last 168 hours.  No results found for this or any previous visit (from the past 240 hour(s)).    Radiology Studies: No results found.  Scheduled Meds:  ALPRAZolam  0.25 mg Oral QHS   aspirin  81 mg Oral Daily   atorvastatin  40 mg Oral Daily   clopidogrel  75 mg Oral Daily   HYDROcodone-acetaminophen  0.5 tablet Oral BID   methocarbamol  500 mg Oral BID   nicotine  21 mg Transdermal Daily   Continuous Infusions:   LOS: 4 days   Time spent: 30 minutes spent on chart review, discussion with nursing staff, consultants, updating family and interview/physical exam; more than 50% of that time was spent in counseling and/or coordination of care.   Irwin Brakeman, MD [facility] Available via Epic secure chat 7am-7pm After these hours, please refer to coverage provider listed on [email or web address] 03/18/2022, 12:42 PM

## 2022-03-19 DIAGNOSIS — I639 Cerebral infarction, unspecified: Secondary | ICD-10-CM | POA: Diagnosis not present

## 2022-03-19 DIAGNOSIS — E871 Hypo-osmolality and hyponatremia: Secondary | ICD-10-CM | POA: Diagnosis not present

## 2022-03-19 DIAGNOSIS — R531 Weakness: Secondary | ICD-10-CM | POA: Diagnosis not present

## 2022-03-19 DIAGNOSIS — Z720 Tobacco use: Secondary | ICD-10-CM | POA: Diagnosis not present

## 2022-03-19 NOTE — Progress Notes (Signed)
PROGRESS NOTE    Sheri Pope  TDD:[id number] DOB: 1941-01-21 DOA: 03/13/2022 PCP: Celene Squibb, MD    Brief Narrative:   Sheri Pope is a 82 y.o. female with past medical history significant for hyperlipidemia, severe/advanced rheumatoid arthritis, osteoarthritis, history of leukemia who presented to [facility] ED on 1/15 with right-sided weakness.  Patient reports onset at roughly 11 AM.  EMS was initially activated, blood sugar was noted to be high in the 200 range and patient refused transport to the ED at that time.  In the afternoon/evening, symptoms continue to progress with increased difficulty being able to ambulate.  EMS was then reactivated patient was brought to ED for further evaluation.  In the ED, temperature 98.0 F, HR 81, RR 23, BP 162/78, SpO2 95% on room air.  WBC 7.1, hemoglobin 13.7, platelets 225.  Sodium 130, potassium 3.7, chloride 99, CO2 22, glucose 107, BUN 10, creatinine 0.48.  AST 16, ALT 15, total bilirubin 0.3.  Urinalysis unrevealing.  EtOH level less than 10.  UDS positive for opiates.  CT head without contrast with no evidence for acute intracranial abnormality, mild chronic small vessel ischemic changes of the white matter.  CT angiogram head/neck with no emergent large vessel occlusion or significantly hemodynamic stenosis of the head/neck.  TRH consulted for admission for further evaluation and management for concern for acute CVA.  Assessment & Plan:   Acute CVA Patient presenting to ED with right-sided weakness.  MR brain without contrast with acute lacunar infarct left thalamocapsular junction, small chronic vessel ischemia including chronic lacunar infarcts left thalamus.  UDS positive for opiates, otherwise unrevealing.  CT a head/neck with no large philtral occlusion.  TTE with LVEF 62-37%, grade 1 diastolic dysfunction, no aortic stenosis, no atrial level shunt detected.  LDL 98, hemoglobin A1c 5.7.  Seen by neurology with recommendations of  aspirin 81 mg p.o. daily, Plavix 75 mg p.o. daily for 3 weeks followed by aspirin alone.  Started on atorvastatin 40 mg p.o. daily.  PT/OT recommend CIR placement on discharge. -- Continue DAPT aspirin 81 mg p.o. daily, Plavix 75 mg p.o. daily x 3 weeks followed by aspirin alone -- Atorvastatin 40 mg p.o. 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 -- checked a thyroid US to further work up - [place] consistent with goiter, no suspicious nodules seen.    Severe rheumatoid arthritis/osteoarthritis -- Hydrocodone BID -- Robaxin BID  Anxiety: -- Xanax 0.25 mg p.o. nightly  Tobacco use disorder Counseled on need for complete cessation. -- Nicotine patch  DVT prophylaxis: SCDs Start: 03/14/22 0041    Code Status: DNR Family Communication: daughter at bedside updated 1/17  Disposition Plan:  Level of care: Med-Surg Status is: Inpatient Remains inpatient appropriate because: Pending CIR evaluation   Consultants:  Neurology  Procedures:  TTE  Antimicrobials:  None  Subjective: Pt reports that she is eager to get to rehab.  She has some swelling in right arm from old IV that has been removed.   Objective: Vitals:   03/17/22 2041 03/18/22 2156 03/19/22 0555 03/19/22 1513  BP: (!) 170/65 (!) 151/78 (!) 150/68 (!) 142/61  Pulse: 98 92 81 84  Resp: '18 18 16   '$ Temp: 99.3 F (37.4 C) 99.4 F (37.4 C) 98.1 F (  36.7 C) 97.8 F (36.6 C)  TempSrc: Oral  Oral Oral  SpO2: 97% 94% 94% 97%  Weight:      Height:        Intake/Output Summary (Last 24 hours) at 03/19/2022 1525 Last data filed at 03/19/2022 5277 Gross per 24 hour  Intake 240 ml  Output 100 ml  Net 140 ml   Filed Weights   03/13/22 2036 03/14/22 0905  Weight: 47.9  kg 43 kg    Examination:  Physical Exam: GEN: NAD, alert and oriented x 3, chronically ill appearance, appears stated age HEENT: NCAT, PERRL, EOMI, sclera clear, MMM PULM: CTAB w/o wheezes/crackles, normal respiratory effort CV: RRR w/o M/G/R GI: abd soft, NTND, NABS, no R/G/M MSK: severe debilitating arthritic changes esp in hands PSYCH: normal mood/affect Integumentary: dry/intact, no rashes or wounds  Neuro Exam Mental Status: A&O x4, no dysarthria, no aphasia Cranial Nerves: visual fields full, PERRL, EOMi, intact smooth pursuit, no nystagmus, no ptosis, facial sensation intact bilaterally, 5/5 jaw strength, nasolabial fold & smile symetric,  eyebrow  raise & 5/5 eye closure symetric, hearing symmetric and normal to rubbing fingers, palate elevates symmetrically, head turning and shoulder shrug intact and symetric bilaterally, tongue protrusion is midline Motor:  LUE 5/5,   LLE 5/5,   RUE 4/5,   RLE 1/5   Sensory: Sensation is intact to light touch Coordination/Movement: no tremor noted  Data Reviewed: I have personally reviewed following labs and imaging studies  CBC: Recent Labs  Lab 03/13/22 2113 03/14/22 0631  WBC 7.3 7.1  NEUTROABS 0.1*  --   HGB 14.0 13.7  HCT 40.1 40.0  MCV 99.0 99.0  PLT 226 824   Basic Metabolic Panel: Recent Labs  Lab 03/13/22 2113 03/14/22 0631 03/15/22 0419 03/16/22 0418 03/17/22 0329  NA 129* 130* 127* 129*  --   K 3.8 3.7 3.7 3.3* 3.7  CL 98 99 97* 101  --   CO2 '24 22 24 22  '$ --   GLUCOSE 106* 107* 102* 105*  --   BUN '13 10 12 10  '$ --   CREATININE 0.57 0.48 0.56 0.54  --   CALCIUM 9.0 8.8* 8.7* 8.3*  --   MG  --  2.0  --  1.7 2.4  PHOS  --  3.1  --   --   --    GFR: Estimated Creatinine Clearance: 38.7 mL/min (by C-G formula based on SCr of 0.54 mg/dL). Liver Function Tests: Recent Labs  Lab 03/13/22 2113 03/14/22 0631  AST 19 16  ALT 16 15  ALKPHOS 115 111  BILITOT 0.7 0.3  PROT 8.3* 8.1  ALBUMIN 3.5 3.3*   No  results for input(s): "LIPASE", "AMYLASE" in the last 168 hours. No results for input(s): "AMMONIA" in the last 168 hours. Coagulation Profile: Recent Labs  Lab 03/13/22 2113  INR 1.0   Cardiac Enzymes: No results for input(s): "CKTOTAL", "CKMB", "CKMBINDEX", "TROPONINI" in the last 168 hours. BNP (last 3 results) No results for input(s): "PROBNP" in the last 8760 hours. HbA1C: No results for input(s): "HGBA1C" in the last 72 hours.  CBG: Recent Labs  Lab 03/14/22 0007 03/15/22 1119 03/15/22 1627  GLUCAP 105* 106* 109*   Lipid Profile: No results for input(s): "CHOL", "HDL", "LDLCALC", "TRIG", "CHOLHDL", "LDLDIRECT" in the last 72 hours.  Thyroid Function Tests: No results for input(s): "TSH", "T4TOTAL", "FREET4", "T3FREE", "THYROIDAB" in the last 72 hours.  Anemia Panel: No results for input(s): "VITAMINB12", "FOLATE", "FERRITIN", "TIBC", "IRON", "RETICCTPCT" in  the last 72 hours. Sepsis Labs: No results for input(s): "PROCALCITON", "LATICACIDVEN" in the last 168 hours.  No results found for this or any previous visit (from the past 240 hour(s)).    Radiology Studies: No results found.  Scheduled Meds:  ALPRAZolam  0.25 mg Oral QHS   aspirin  81 mg Oral Daily   atorvastatin  40 mg Oral Daily   clopidogrel  75 mg Oral Daily   HYDROcodone-acetaminophen  0.5 tablet Oral BID   methocarbamol  500 mg Oral BID   nicotine  21 mg Transdermal Daily   Continuous Infusions:   LOS: 5 days   Time spent: 30 minutes spent on chart review, discussion with nursing staff, consultants, updating family and interview/physical exam; more than 50% of that time was spent in counseling and/or coordination of care.   Irwin Brakeman, MD [facility] Available via Epic secure chat 7am-7pm After these hours, please refer to coverage provider listed on [email or web address] 03/19/2022, 3:25 PM

## 2022-03-20 ENCOUNTER — Other Ambulatory Visit: Payer: Self-pay

## 2022-03-20 ENCOUNTER — Encounter (HOSPITAL_COMMUNITY): Payer: Self-pay | Admitting: Physical Medicine & Rehabilitation

## 2022-03-20 ENCOUNTER — Inpatient Hospital Stay (HOSPITAL_COMMUNITY)
Admission: RE | Admit: 2022-03-20 | Discharge: 2022-03-29 | DRG: 057 | Disposition: A | Discharge status: Home Health | Payer: Medicare HMO | Source: Other Acute Inpatient Hospital | Attending: Physical Medicine & Rehabilitation | Admitting: Physical Medicine & Rehabilitation

## 2022-03-20 DIAGNOSIS — I10 Essential (primary) hypertension: Secondary | ICD-10-CM | POA: Diagnosis present

## 2022-03-20 DIAGNOSIS — R54 Age-related physical debility: Secondary | ICD-10-CM | POA: Diagnosis present

## 2022-03-20 DIAGNOSIS — I69328 Other speech and language deficits following cerebral infarction: Secondary | ICD-10-CM | POA: Diagnosis not present

## 2022-03-20 DIAGNOSIS — E876 Hypokalemia: Secondary | ICD-10-CM | POA: Diagnosis present

## 2022-03-20 DIAGNOSIS — M1990 Unspecified osteoarthritis, unspecified site: Secondary | ICD-10-CM | POA: Diagnosis present

## 2022-03-20 DIAGNOSIS — Z90710 Acquired absence of both cervix and uterus: Secondary | ICD-10-CM

## 2022-03-20 DIAGNOSIS — Z7982 Long term (current) use of aspirin: Secondary | ICD-10-CM

## 2022-03-20 DIAGNOSIS — I6381 Other cerebral infarction due to occlusion or stenosis of small artery: Secondary | ICD-10-CM | POA: Diagnosis not present

## 2022-03-20 DIAGNOSIS — Z856 Personal history of leukemia: Secondary | ICD-10-CM

## 2022-03-20 DIAGNOSIS — K219 Gastro-esophageal reflux disease without esophagitis: Secondary | ICD-10-CM | POA: Diagnosis present

## 2022-03-20 DIAGNOSIS — Z8249 Family history of ischemic heart disease and other diseases of the circulatory system: Secondary | ICD-10-CM | POA: Diagnosis not present

## 2022-03-20 DIAGNOSIS — Z7902 Long term (current) use of antithrombotics/antiplatelets: Secondary | ICD-10-CM | POA: Diagnosis not present

## 2022-03-20 DIAGNOSIS — A499 Bacterial infection, unspecified: Secondary | ICD-10-CM | POA: Diagnosis not present

## 2022-03-20 DIAGNOSIS — Z720 Tobacco use: Secondary | ICD-10-CM | POA: Diagnosis not present

## 2022-03-20 DIAGNOSIS — E049 Nontoxic goiter, unspecified: Secondary | ICD-10-CM | POA: Diagnosis present

## 2022-03-20 DIAGNOSIS — Z882 Allergy status to sulfonamides status: Secondary | ICD-10-CM

## 2022-03-20 DIAGNOSIS — E785 Hyperlipidemia, unspecified: Secondary | ICD-10-CM | POA: Diagnosis present

## 2022-03-20 DIAGNOSIS — Z96652 Presence of left artificial knee joint: Secondary | ICD-10-CM | POA: Diagnosis present

## 2022-03-20 DIAGNOSIS — Z823 Family history of stroke: Secondary | ICD-10-CM | POA: Diagnosis not present

## 2022-03-20 DIAGNOSIS — E871 Hypo-osmolality and hyponatremia: Secondary | ICD-10-CM | POA: Diagnosis not present

## 2022-03-20 DIAGNOSIS — F17210 Nicotine dependence, cigarettes, uncomplicated: Secondary | ICD-10-CM | POA: Diagnosis present

## 2022-03-20 DIAGNOSIS — M069 Rheumatoid arthritis, unspecified: Secondary | ICD-10-CM | POA: Diagnosis not present

## 2022-03-20 DIAGNOSIS — E222 Syndrome of inappropriate secretion of antidiuretic hormone: Secondary | ICD-10-CM | POA: Diagnosis not present

## 2022-03-20 DIAGNOSIS — Z888 Allergy status to other drugs, medicaments and biological substances status: Secondary | ICD-10-CM

## 2022-03-20 DIAGNOSIS — Z79899 Other long term (current) drug therapy: Secondary | ICD-10-CM

## 2022-03-20 DIAGNOSIS — Z741 Need for assistance with personal care: Secondary | ICD-10-CM | POA: Diagnosis present

## 2022-03-20 DIAGNOSIS — R531 Weakness: Secondary | ICD-10-CM | POA: Diagnosis not present

## 2022-03-20 DIAGNOSIS — Z96641 Presence of right artificial hip joint: Secondary | ICD-10-CM | POA: Diagnosis present

## 2022-03-20 DIAGNOSIS — Z881 Allergy status to other antibiotic agents status: Secondary | ICD-10-CM

## 2022-03-20 DIAGNOSIS — Z91040 Latex allergy status: Secondary | ICD-10-CM

## 2022-03-20 DIAGNOSIS — Z886 Allergy status to analgesic agent status: Secondary | ICD-10-CM

## 2022-03-20 DIAGNOSIS — N029 Recurrent and persistent hematuria with unspecified morphologic changes: Secondary | ICD-10-CM | POA: Diagnosis present

## 2022-03-20 DIAGNOSIS — I69351 Hemiplegia and hemiparesis following cerebral infarction affecting right dominant side: Secondary | ICD-10-CM | POA: Diagnosis not present

## 2022-03-20 DIAGNOSIS — I639 Cerebral infarction, unspecified: Secondary | ICD-10-CM | POA: Diagnosis not present

## 2022-03-20 DIAGNOSIS — N390 Urinary tract infection, site not specified: Secondary | ICD-10-CM | POA: Diagnosis present

## 2022-03-20 DIAGNOSIS — I69322 Dysarthria following cerebral infarction: Secondary | ICD-10-CM

## 2022-03-20 DIAGNOSIS — B9620 Unspecified Escherichia coli [E. coli] as the cause of diseases classified elsewhere: Secondary | ICD-10-CM | POA: Diagnosis present

## 2022-03-20 DIAGNOSIS — Z885 Allergy status to narcotic agent status: Secondary | ICD-10-CM

## 2022-03-20 MED ORDER — HYDROCODONE-ACETAMINOPHEN 5-325 MG PO TABS
0.5000 | ORAL_TABLET | Freq: Two times a day (BID) | ORAL | Status: DC
  Administered 2022-03-20 – 2022-03-29 (×18): 0.5 via ORAL
  Filled 2022-03-20 (×18): qty 1

## 2022-03-20 MED ORDER — ACETAMINOPHEN 325 MG PO TABS: 650.0000 mg | ORAL_TABLET | Freq: Four times a day (QID) | ORAL | Status: DC | PRN

## 2022-03-20 MED ORDER — POLYVINYL ALCOHOL 1.4 % OP SOLN
1.0000 [drp] | Freq: Four times a day (QID) | OPHTHALMIC | Status: DC | PRN
  Administered 2022-03-20: 1 [drp] via OPHTHALMIC
  Filled 2022-03-20: qty 15

## 2022-03-20 MED ORDER — NICOTINE 21 MG/24HR TD PT24
21.0000 mg | MEDICATED_PATCH | Freq: Every day | TRANSDERMAL | Status: DC
  Administered 2022-03-21 – 2022-03-29 (×9): 21 mg via TRANSDERMAL
  Filled 2022-03-20 (×9): qty 1

## 2022-03-20 MED ORDER — ALPRAZOLAM 0.25 MG PO TABS
0.2500 mg | ORAL_TABLET | Freq: Every day | ORAL | Status: DC
  Administered 2022-03-20 – 2022-03-28 (×9): 0.25 mg via ORAL
  Filled 2022-03-20 (×9): qty 1

## 2022-03-20 MED ORDER — CLOPIDOGREL BISULFATE 75 MG PO TABS: 75.0000 mg | ORAL_TABLET | Freq: Every day | ORAL | 0 refills | Status: DC

## 2022-03-20 MED ORDER — ALPRAZOLAM 0.25 MG PO TABS: 0.2500 mg | ORAL_TABLET | Freq: Every day | ORAL | Status: DC

## 2022-03-20 MED ORDER — ASPIRIN 81 MG PO CHEW: 81.0000 mg | CHEWABLE_TABLET | Freq: Every day | ORAL | Status: AC

## 2022-03-20 MED ORDER — NICOTINE 21 MG/24HR TD PT24: 21.0000 mg | MEDICATED_PATCH | Freq: Every day | TRANSDERMAL | 0 refills | Status: DC

## 2022-03-20 MED ORDER — CLOPIDOGREL BISULFATE 75 MG PO TABS
75.0000 mg | ORAL_TABLET | Freq: Every day | ORAL | Status: DC
  Administered 2022-03-21 – 2022-03-29 (×9): 75 mg via ORAL
  Filled 2022-03-20 (×9): qty 1

## 2022-03-20 MED ORDER — ACETAMINOPHEN 650 MG RE SUPP: 650.0000 mg | Freq: Four times a day (QID) | RECTAL | Status: DC | PRN

## 2022-03-20 MED ORDER — ASPIRIN 81 MG PO CHEW
81.0000 mg | CHEWABLE_TABLET | Freq: Every day | ORAL | Status: DC
  Administered 2022-03-21 – 2022-03-29 (×9): 81 mg via ORAL
  Filled 2022-03-20 (×9): qty 1

## 2022-03-20 MED ORDER — METHOCARBAMOL 500 MG PO TABS
500.0000 mg | ORAL_TABLET | Freq: Two times a day (BID) | ORAL | Status: DC
  Administered 2022-03-20 – 2022-03-29 (×18): 500 mg via ORAL
  Filled 2022-03-20 (×18): qty 1

## 2022-03-20 MED ORDER — ATORVASTATIN CALCIUM 40 MG PO TABS: 40.0000 mg | ORAL_TABLET | Freq: Every day | ORAL | Status: DC

## 2022-03-20 MED ORDER — ONDANSETRON HCL 4 MG/2ML IJ SOLN: 4.0000 mg | Freq: Four times a day (QID) | INTRAMUSCULAR | Status: DC | PRN

## 2022-03-20 MED ORDER — ONDANSETRON HCL 4 MG PO TABS: 4.0000 mg | ORAL_TABLET | Freq: Four times a day (QID) | ORAL | Status: DC | PRN

## 2022-03-20 MED ORDER — ATORVASTATIN CALCIUM 40 MG PO TABS
40.0000 mg | ORAL_TABLET | Freq: Every day | ORAL | Status: DC
  Administered 2022-03-21 – 2022-03-29 (×9): 40 mg via ORAL
  Filled 2022-03-20 (×9): qty 1

## 2022-03-20 NOTE — Discharge Instructions (Addendum)
IMPORTANT INFORMATION: PAY CLOSE ATTENTION   PHYSICIAN DISCHARGE INSTRUCTIONS  Follow with Primary care provider  Celene Squibb, MD  and other consultants as instructed by your Hospitalist Physician  [place] IF SYMPTOMS COME BACK, WORSEN OR NEW PROBLEM DEVELOPS   Please note: You were cared for by a hospitalist during your hospital stay. Every effort will be made to forward records to your primary care provider.  You can request that your primary care provider send for your hospital records if they have not received them.  Once you are discharged, your primary care physician will handle any further medical issues. Please note that NO REFILLS for any discharge medications will be authorized once you are discharged, as it is imperative that you return to your primary care physician (or establish a relationship with a primary care physician if you do not have one) for your post hospital discharge needs so that they can reassess your need for medications and monitor your lab values.  Please get a complete blood count and chemistry panel checked by your Primary MD at your next visit, and again as instructed by your Primary MD.  Get Medicines reviewed and adjusted: Please take all your medications with you for your next visit with your Primary MD  Laboratory/radiological data: Please request your Primary MD to go over all hospital tests and procedure/radiological results at the follow up, please ask your primary care provider to get all Hospital records sent to his/her office.  In some cases, they will be blood work, cultures and biopsy results pending at the time of your discharge. Please request that your primary care provider follow up on these results.  If you are diabetic, please bring your blood sugar readings with you to your follow up appointment with primary care.    Please call and make your follow up appointments as soon as possible.    Also Note the  following: If you experience worsening of your admission symptoms, develop shortness of breath, life threatening emergency, suicidal or homicidal thoughts you must seek medical attention immediately by calling 911 or calling your MD immediately  if symptoms less severe.  You must read complete instructions/literature along with all the possible adverse reactions/side effects for all the Medicines you take and that have been prescribed to you. Take any new Medicines after you have completely understood and accpet all the possible adverse reactions/side effects.   Do not drive when taking Pain medications or sleeping medications (Benzodiazepines)  Do not take more than prescribed Pain, Sleep and Anxiety Medications. It is not advisable to combine anxiety,sleep and pain medications without talking with your primary care practitioner  Special Instructions: If you have smoked or chewed Tobacco  in the last 2 yrs please stop smoking, stop any regular Alcohol  and or any Recreational drug use.  Wear Seat belts while driving.  Do not drive if taking any narcotic, mind altering or controlled substances or recreational drugs or alcohol.      Inpatient Rehab Discharge Instructions  Sheri Pope Discharge date and time: 03/20/2022 12:30 PM   Activities/Precautions/ Functional Status: Activity: activity as tolerated Diet: regular diet Wound Care: Routine skin checks Functional status:  ___ No restrictions     ___ Walk up steps independently ___ 24/7 supervision/assistance   ___ Walk up steps with assistance ___ Intermittent supervision/assistance  ___ Bathe/dress independently ___ Walk with walker     _x__ Bathe/dress with assistance ___ Walk Independently  ___ Shower independently ___ Walk with assistance    ___ Shower with assistance ___ No alcohol     ___ Return to work/school ________  Special Instructions:  No Driving Smoking or alcoholSTROKE/TIA DISCHARGE INSTRUCTIONS SMOKING  Cigarette smoking nearly doubles your risk of having a stroke & is the single most alterable risk factor  If you smoke or have smoked in the last 12 months, you are advised to quit smoking for your health. Most of the excess cardiovascular risk related to smoking disappears within a year of stopping. Ask you doctor about anti-smoking medications [name] Quit Line: 1-800-QUIT NOW Free Smoking Cessation Classes [phone]  CHOLESTEROL Know your levels; limit fat & cholesterol in your diet  Lipid Panel     Component Value Date/Time   CHOL 162 03/13/2022 2113   TRIG 94 03/13/2022 2113   HDL 45 03/13/2022 2113   CHOLHDL 3.6 03/13/2022 2113   VLDL 19 03/13/2022 2113   [place] 98 03/13/2022 2113     Many patients benefit from treatment even if their cholesterol is at goal. Goal: Total Cholesterol (CHOL) less than 160 Goal:  Triglycerides (TRIG) less than 150 Goal:  HDL greater than 40 Goal:  LDL (LDLCALC) less than 100   BLOOD PRESSURE American Stroke Association blood pressure target is less that 120/80 mm/Hg  Your discharge blood pressure is:  BP: (!) 149/61 Monitor your blood pressure Limit your salt and alcohol intake Many individuals will require more than one medication for high blood pressure  DIABETES (A1c is a blood sugar average for last 3 months) Goal HGBA1c is under 7% (HBGA1c is blood sugar average for last 3 months)  Diabetes: No known diagnosis of diabetes    Lab Results  Component Value Date   HGBA1C 5.7 (H) 03/14/2022    Your HGBA1c can be lowered with medications, healthy diet, and exercise. Check your blood sugar as directed by your physician Call your physician if you experience unexplained or low blood sugars.  PHYSICAL ACTIVITY/REHABILITATION Goal is 30 minutes at least 4 days per week  Activity: Increase activity slowly, Therapies: Physical Therapy: Home Health Return to work:  Activity decreases your risk of heart attack and stroke and makes your heart  stronger.  It helps control your weight and blood pressure; helps you relax and can improve your mood. Participate in a regular exercise program. Talk with your doctor about the best form of exercise for you (dancing, walking, swimming, cycling).  DIET/WEIGHT Goal is to maintain a healthy weight  Your discharge diet is:  Diet Order             DIET DYS 3 Room service appropriate? Yes; Fluid consistency: Thin; Fluid restriction: 1500 mL Fluid  Diet effective now                   liquids Your height is:  Height: '5\' 1"'$  (154.9 cm) Your current weight is: Weight: 43 kg Your Body Mass Index (BMI) is:  BMI (Calculated): 17.92 Following the type of diet specifically designed for you will help prevent another stroke. Your goal weight range is:   Your goal Body Mass Index (BMI) is 19-24. Healthy food habits can help reduce 3 risk factors for stroke:  High cholesterol, hypertension, and excess weight.  RESOURCES Stroke/Support Group:  Call [phone]   STROKE EDUCATION PROVIDED/REVIEWED AND GIVEN TO PATIENT Stroke warning signs and symptoms How to activate emergency medical system (call 911). Medications prescribed at discharge. Need for follow-up after discharge. Personal  risk factors for stroke. Pneumonia vaccine given: No Flu vaccine given: No My questions have been answered, the writing is legible, and I understand these instructions.  I will adhere to these goals & educational materials that have been provided to me after my discharge from the hospital.     My questions have been answered and I understand these instructions. I will adhere to these goals and the provided educational materials after my discharge from the hospital.  Patient/Caregiver Signature _______________________________ Date __________  Clinician Signature _______________________________________ Date __________  Please bring this form and your medication list with you to all your follow-up doctor's  appointments.

## 2022-03-20 NOTE — Progress Notes (Signed)
Patient discharged to [facility] for rehabilitation. Report called and given to Marvetta Gibbons, RN. IV catheter discontinued,catheter intact.Transported by [facility] to awaiting facility.

## 2022-03-20 NOTE — H&P (Signed)
Physical Medicine and Rehabilitation Admission H&P        Chief Complaint  Patient presents with   Weakness  : HPI: Sheri Pope is a 82 year old right-handed female with history of hyperlipidemia, severe/advanced rheumatoid arthritis and maintained on 1/2 tablet of hydrocodone 5/325 mg every 6 hours as needed, right total hip replacement 2007 and left total knee replacement 2013 and multiple surgeries to hands and feet related to rheumatoid arthritis, tobacco use as well as history of leukemia followed by Dr Derek Jack and refused current treatment.  Per chart review lives in a 1 level home with ramped entrance.  Community ambulator without assistive device.  She does not drive.  Independent with ADLs.  Her nephew and daughter providing support as needed.  Presented to [facility] 03/13/2022 with acute onset of right-sided weakness and slurred speech.  CT/MRI showed acute lacunar infarct at the left thalamocapsular junction as well as chronic small vessel ischemia including chronic lacunar infarcts of the left thalamus.  Patient did not receive tPA.  CT angiogram head and neck no emergent large vessel occlusion or stenosis.  Noted incidental findings of mildly enlarged and heterogeneous thyroid gland.  Ultrasound of the thyroid showed heterogeneous and borderline enlarged thyroid, compatible with medical thyroid disease/goiter and no suspicious nodules seen.  Admission chemistries unremarkable except sodium 129 glucose 106, urine drug screen positive opiates, urine osmolality 206.  Echocardiogram with ejection fraction of 50 to 55% no wall motion abnormalities grade 1 diastolic dysfunction.  Neurology follow-up currently maintained on low-dose aspirin as well as Plavix for CVA prophylaxis x 3 weeks then aspirin alone.  Hyponatremia improved with fluid restriction.  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  Therapy evaluations completed  due to patient decreased functional ability right-sided weakness as well as advanced rheumatoid arthritis was admitted for a comprehensive rehab program.   Review of Systems  Constitutional:  Negative for chills and fever.  HENT:  Negative for hearing loss.   Eyes:  Negative for blurred vision and double vision.  Respiratory:  Negative for cough and shortness of breath.   Cardiovascular:  Positive for leg swelling. Negative for chest pain and palpitations.  Gastrointestinal:  Positive for constipation. Negative for heartburn, nausea and vomiting.       GERD  Genitourinary:  Negative for dysuria, flank pain and hematuria.  Musculoskeletal:  Positive for back pain, joint pain and myalgias.  Skin:  Negative for rash.  Neurological:  Positive for speech change and weakness.  Psychiatric/Behavioral:         Anxiety  All other systems reviewed and are negative.       Past Medical History:  Diagnosis Date   Arthritis      RA AND OA --PAIN AND SWELLIN IN LEFT KNEE   Blood in urine      NEGATIVE UROLOGY WORK UP --BUT ALWAYS HAS BLOOD IN URINE   GERD (gastroesophageal reflux disease)     H/O hiatal hernia     Hyperlipidemia     Leukemia (HCC)     PONV (postoperative nausea and vomiting)      ALWAYS SICK AFTER SURGERIES EXCEPT AFTER HIP REPLACMENT 2007   Vitamin D deficiency           Past Surgical History:  Procedure Laterality Date   ABDOMINAL HYSTERECTOMY   1870'S   BREAST SURGERY        BREAST BIOSPIES   CATARACT EXTRACTION W/PHACO Left 05/30/2012  Procedure: CATARACT EXTRACTION PHACO AND INTRAOCULAR LENS PLACEMENT (IOC);  Surgeon: Tonny Branch, MD;  Location: [facility];  Service: Ophthalmology;  Laterality: Left;  CDE: 16.99   CATARACT EXTRACTION W/PHACO Right 06/24/2012    Procedure: CATARACT EXTRACTION PHACO AND INTRAOCULAR LENS PLACEMENT (IOC);  Surgeon: Tonny Branch, MD;  Location: [facility];  Service: Ophthalmology;  Laterality: Right;  CDE:17.92   COLONOSCOPY WITH PROPOFOL N/A  10/14/2020    Procedure: COLONOSCOPY WITH PROPOFOL;  Surgeon: Daneil Dolin, MD;  Location: [facility];  Service: Endoscopy;  Laterality: N/A;  ASA III / 12:30   ESOPHAGOGASTRODUODENOSCOPY (EGD) WITH PROPOFOL N/A 10/14/2020    Procedure: ESOPHAGOGASTRODUODENOSCOPY (EGD) WITH PROPOFOL;  Surgeon: Daneil Dolin, MD;  Location: [facility];  Service: Endoscopy;  Laterality: N/A;   HIP SURGERY       JOINT REPLACEMENT   2007    RIGHT HIP REPLACEMENT   LEFT KNEE ARTHROSCOPY   AUG 2012   MALONEY DILATION   10/14/2020    Procedure: MALONEY DILATION;  Surgeon: Daneil Dolin, MD;  Location: [facility];  Service: Endoscopy;;   MULTIPLE ORTHOPEDIC SURGERIES        ON BOTH HANDS AND BOTH FEET, RIGHT ELBOW   SPLEENECTOMY   IN THE 70'S    FOR LARGE CYST   TOTAL KNEE ARTHROPLASTY   11/10/2011    Procedure: TOTAL KNEE ARTHROPLASTY;  Surgeon: Mcarthur Rossetti, MD;  Location: [facility];  Service: Orthopedics;  Laterality: Left;  Left Total Knee Arthroplasty   YAG LASER APPLICATION Bilateral      Dr. Geoffry Paradise         Family History  Problem Relation Age of Onset   Diabetes Sister     Macular degeneration Sister     Heart attack Mother     Stroke Father     Dementia Father     Cancer Brother     Healthy Sister     Healthy Sister     Diabetes Brother      Social History:  reports that she has been smoking cigarettes. She has a 50.00 pack-year smoking history. She has never used smokeless tobacco. She reports that she does not drink alcohol and does not use drugs. Allergies:       Allergies  Allergen Reactions   Codeine Nausea And Vomiting   Prednisone Other (See Comments)      Thought she was having a heart attack   Sulfa Antibiotics Other (See Comments)      unknown   Aleve [Naproxen] Anxiety      Jittery and hot flashes   Latex Rash          Medications Prior to Admission  Medication Sig Dispense Refill   ALPRAZolam (XANAX) 0.25 MG tablet Take 0.25 mg by mouth at bedtime.        HYDROcodone-acetaminophen (NORCO/VICODIN) 5-325 MG tablet Take 0.5 tablets by mouth every 6 (six) hours as needed for moderate pain.       methocarbamol (ROBAXIN) 500 MG tablet Take 500 mg by mouth every 6 (six) hours as needed for muscle spasms.       Polyvinyl Alcohol-Povidone (REFRESH OP) Place 1 drop into both eyes 4 (four) times daily as needed (dry eyes).              Home: Home Living Family/patient expects to be discharged to:: Private residence Living Arrangements: Other relatives Available Help at Discharge: Family, Available 24 hours/day Type of Home: House Home Access: Ramped  entrance Home Layout: One level Bathroom Shower/Tub: Chiropodist: Handicapped height Bathroom Accessibility: Yes Home Equipment: Conservation officer, nature (2 wheels), Systems analyst, [employer] - single point, BSC/3in1, Civil engineer, contracting, Grab bars - tub/shower   Functional History: Prior Function Prior Level of Function : Independent/Modified Independent Mobility Comments: Community ambulator without AD. Does not drive. ADLs Comments: Independent ADL and IADL.   Functional Status:  Mobility: Bed Mobility Overal bed mobility: Needs Assistance Bed Mobility: Supine to Sit Supine to sit: Min assist, Mod assist General bed mobility comments: increased time, labored movement Transfers Overall transfer level: Needs assistance Equipment used: Rolling walker (2 wheels) Transfers: Sit to/from Stand, Bed to chair/wheelchair/BSC Sit to Stand: Min assist Bed to/from chair/wheelchair/BSC transfer type:: Step pivot Step pivot transfers: Mod assist General transfer comment: unsteady labored movement with difficulty advancing RLE due to weakness Ambulation/Gait Ambulation/Gait assistance: Mod assist Gait Distance (Feet): 30 Feet Assistive device: Rolling walker (2 wheels) Gait Pattern/deviations: Decreased step length - right, Decreased step length - left, Decreased stride length, Decreased dorsiflexion -  right, Antalgic, Shuffle General Gait Details: increased endurance/distance for taking steps with improvement for holding onto walker with right hand, had frequent incoordination of RLE, followed with w/c for safety and limited mostly due to fatigue Gait velocity: decreased   ADL: ADL Overall ADL's : Needs assistance/impaired Eating/Feeding: Minimal assistance, Sitting Grooming: Minimal assistance, Sitting Upper Body Bathing: Minimal assistance Lower Body Bathing: Maximal assistance, Moderate assistance, Sitting/lateral leans Upper Body Dressing : Minimal assistance Lower Body Dressing: Maximal assistance, Sitting/lateral leans Toilet Transfer: Minimal assistance, Ambulation, Rolling walker (2 wheels) Toilet Transfer Details (indicate cue type and reason): Simulated via ambulation in the hall and return to bed. Toileting- Clothing Manipulation and Hygiene: Minimal assistance, Moderate assistance, Sitting/lateral lean Tub/ Shower Transfer: Minimal assistance, Ambulation, Rolling walker (2 wheels) Functional mobility during ADLs: Minimal assistance, Moderate assistance, Rolling walker (2 wheels) General ADL Comments: Able to complete x2 reps of ~8 feet of ambulation forward and backwards with use of RW. Rest breaks seated at EOB between attempts.   Cognition: Cognition Overall Cognitive Status: Within Functional Limits for tasks assessed Orientation Level: Oriented X4 Cognition Arousal/Alertness: Awake/alert Behavior During Therapy: WFL for tasks assessed/performed Overall Cognitive Status: Within Functional Limits for tasks assessed   Physical Exam: Blood pressure 133/60, pulse 81, temperature 97.9 F (36.6 C), temperature source Oral, resp. rate 16, height '5\' 1"'$  (1.549 m), weight 43 kg, SpO2 98 %. Physical Exam Constitutional:      Comments: Frail appearing  HENT:     Head: Normocephalic and atraumatic.     Right Ear: External ear normal.     Left Ear: External ear normal.      Nose: Nose normal.     Mouth/Throat:     Comments: dentures Eyes:     Extraocular Movements: Extraocular movements intact.     Pupils: Pupils are equal, round, and reactive to light.  Neck:     Comments: Thyroid enlargement Cardiovascular:     Rate and Rhythm: Normal rate and regular rhythm.     Heart sounds: No murmur heard.    No gallop.  Pulmonary:     Effort: Pulmonary effort is normal. No respiratory distress.     Breath sounds: No stridor. No wheezing or rales.  Abdominal:     General: Bowel sounds are normal. There is no distension.     Palpations: Abdomen is soft.  Musculoskeletal:     Cervical back: Neck supple.     Comments: Severe rheumatoid  changes to hands and feet with deformities most prominent in the hands. Multiple rheumatoid nodules on all 4 limbs.   Skin:    General: Skin is warm and dry.     Comments: Left TKA, right THA scars  Neurological:     Mental Status: She is alert.     Comments: Alert and oriented x 3. Normal insight and awareness. Intact Memory. Normal language and speech. Cranial nerve exam unremarkable. RUE 3+/5 prox to distal. LUE 4+/5. RLE 4 to 4+/5. LLE 4 to 4+/5. No focal sensory abnl. DTR's 1+ to trace. No abnl resting tone.   Psychiatric:     Comments: Very pleasant and engaging!        Lab Results Last 48 Hours  No results found for this or any previous visit (from the past 48 hour(s)).   Imaging Results (Last 48 hours)  No results found.         Blood pressure 133/60, pulse 81, temperature 97.9 F (36.6 C), temperature source Oral, resp. rate 16, height '5\' 1"'$  (1.549 m), weight 43 kg, SpO2 98 %.   Medical Problem List and Plan: 1. 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 Very active despite jt disease             -patient may  shower             -ELOS/Goals: 14-17 days, mod I mobility/cognition and supervision with self-care  2.   Antithrombotics: -DVT/anticoagulation:  Mechanical: Antiembolism stockings, thigh (TED hose) Bilateral lower extremities             -antiplatelet therapy: Aspirin 81 mg daily and Plavix 75 mg daily x three weeks then aspirin alone/ Initiated 03/14/2022  3. Pain Management: Hydrocodone as needed, Robaxin 500 mg twice daily 4. Mood/Behavior/Sleep: Xanax 0.25 mg nightly for sleep.  Provide emotional support             -antipsychotic agents: N/A 5. Neuropsych/cognition: This patient is capable of making decisions on her own behalf. 6. Skin/Wound Care: Routine skin checks 7. Fluids/Electrolytes/Nutrition: Routine in and outs with follow-up chemistries 8.  Hyperlipidemia.  Lipitor 9.  Severe/advanced rheumatoid arthritis with multiple joint replacements.  Follow-up outpatient.             -pain seems relatively controlled 10.  History of tobacco use.  NicoDerm patch.  Provide counseling 11.  History of leukemia.  Follow-up outpatient Dr.Katragadda 12.  Chronic hyponatremia related to history of leukemia/SIADH.  Continue fluid restriction.             -f/u labs on admission 13.  Large thyroid gland.  Incidental finding of heterogeneous enlarged thyroid gland on CT angio head and neck.  Thyroid ultrasound consistent with goiter, no suspicious nodule seen.  Follow-up outpatient       Cathlyn Parsons, PA-C 03/20/2022  I have personally performed a face to face diagnostic evaluation of this patient and formulated the key components of the plan.  Additionally, I have personally reviewed laboratory data, imaging studies, as well as relevant notes and concur with the physician assistant's documentation above.  The patient's status has not changed from the original H&P.  Any changes in documentation from the acute care chart have been noted above.  Meredith Staggers, MD, Mell[facility] Drown

## 2022-03-20 NOTE — TOC Transition Note (Signed)
Transition of Care [facility]) - CM/SW Discharge Note   Patient Details  Name: RAYMONDE HAMBLIN MRN: [id number] Date of Birth: 04-12-1940  [facility] of [facility]) CM/SW Contact:  Boneta Lucks, RN Phone Number: 03/20/2022, 10:07 AM   Clinical Narrative:   [facility] has a bed and setting up [facility] for transportation. Team updated. CM printed Med necessity. RN to call report.    Final next level of care: IP Rehab Facility Barriers to Discharge: Barriers Resolved   Patient Goals and CMS Choice CMS Medicare.gov Compare Post Acute Care list provided to:: Patient Represenative (must comment) Choice offered to / list presented to : Adult Children  Discharge Placement               Patient chooses bed at:  ([facility]) Patient to be transferred to facility by: [facility]   Patient and family notified of of transfer: 03/20/22  Discharge Plan and Services Additional resources added to the After Visit Summary for      Post Acute Care Choice: IP Rehab                Social Determinants of Health (SDOH) Interventions SDOH Screenings   Food Insecurity: No Food Insecurity (03/14/2022)  Housing: Low Risk  (03/14/2022)  Transportation Needs: No Transportation Needs (03/14/2022)  Utilities: Not At Risk (03/14/2022)  Tobacco Use: High Risk (03/13/2022)    Readmission Risk Interventions    03/15/2022    1:30 PM  Readmission Risk Prevention Plan  Post Dischage Appt Not Complete  Medication Screening Complete  Transportation Screening Complete

## 2022-03-20 NOTE — Progress Notes (Addendum)
PMR Admission Coordinator Pre-Admission Assessment   Patient: Sheri Pope is an 82 y.o., female MRN: [id number] DOB: February 18, 1941 Height: '5\' 1"'$  (154.9 cm) Weight: 43 kg   Insurance Information HMO: yes    PPO:      PCP:      IPA:      80/20:      OTHER:  PRIMARY: [employer]       Policy#: [id number]      Subscriber: Pt CM Name:       Phone#: [phone]     Fax#: [phone]  Pt. Approved 03/17/22-03/24/22 with updates due 6//28/36 Pre-Cert#: [id number]      Employer:  Benefits:  Phone #:      Name:  Irene Shipper Date: 02/27/2022- still active   Deductible: does not have one   OOP Max: $3,600 ($10 met)   CIR: $295/day co-pay with a max co-pay of $2,065/admission (7 days)   SNF: $20/day co-pay for days 1-20, $203/day co-pay for days 21-100, limited to 100 days/cal yr   Outpatient:  $25 copay/visit Home Health:  100% coverage   DME: 80% coverage; 20% co-insurance  Providers: in network   SECONDARY:       Policy#:      Phone#:       Development worker, community:       Phone#:    The Engineer, petroleum" for patients in Inpatient Rehabilitation Facilities with attached "Privacy Act [place] Records" was provided and verbally reviewed with: Patient   Emergency Contact Information Contact Information       Name Relation Home Work Mobile    bondurant, patty Niece     [phone]           Current Medical History  Patient Admitting Diagnosis: CVA History of Present Illness:Sheri Pope is a 82 year old right-handed female with history of hyperlipidemia, severe/advanced rheumatoid arthritis and maintained on 1/2 tablet of hydrocodone 5/325 mg every 6 hours as needed, right total hip replacement 2007 and left total knee replacement 2013 and multiple surgeries to hands and feet related to rheumatoid arthritis, tobacco use as well as history of leukemia followed by Dr Derek Jack.  Per chart review lives in a 1 level home with ramped entrance.  Community  ambulator without assistive device.  She does not drive.  Independent with ADLs.  Her nephew and daughter providing support as needed.  Presented to [facility] 03/13/2022 with acute onset of right-sided weakness and slurred speech.  CT/MRI showed acute lacunar infarct at the left thalamocapsular junction as well as chronic small vessel ischemia including chronic lacunar infarcts of the left thalamus.  Patient did not receive tPA.  CT angiogram head and neck no emergent large vessel occlusion or stenosis.  Noted incidental findings of mildly enlarged and heterogeneous thyroid gland.  Ultrasound of the thyroid showed heterogeneous and borderline enlarged thyroid, compatible with medical thyroid disease/goiter and no suspicious nodules seen.  Admission chemistries unremarkable except sodium 129 glucose 106, urine drug screen positive opiates, urine osmolality 206.  Echocardiogram with ejection fraction of 50 to 55% no wall motion abnormalities grade 1 diastolic dysfunction.  Neurology follow-up currently maintained on low-dose aspirin as well as Plavix for CVA prophylaxis x 3 weeks then aspirin alone.  Hyponatremia improved with fluid restriction.  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  Therapy evaluations completed due to patient decreased functional ability right-sided weakness as well as advanced rheumatoid arthritis was admitted for a comprehensive  rehab program.   Complete NIHSS TOTAL: 1   Patient's medical record from [facility]  has been reviewed by the rehabilitation admission coordinator and physician.   Past Medical History      Past Medical History:  Diagnosis Date   Arthritis      RA AND OA --PAIN AND SWELLIN IN LEFT KNEE   Blood in urine      NEGATIVE UROLOGY WORK UP --BUT ALWAYS HAS BLOOD IN URINE   GERD (gastroesophageal reflux disease)     H/O hiatal hernia     Hyperlipidemia     Leukemia (HCC)     PONV  (postoperative nausea and vomiting)      ALWAYS SICK AFTER SURGERIES EXCEPT AFTER HIP REPLACMENT 2007   Vitamin D deficiency        Has the patient had major surgery during 100 days prior to admission? No   Family History   family history includes Cancer in her brother; Dementia in her father; Diabetes in her brother and sister; Healthy in her sister and sister; Heart attack in her mother; Macular degeneration in her sister; Stroke in her father.   Current Medications   Current Facility-Administered Medications:    0.9 %  sodium chloride infusion, , Intravenous, Continuous, Johnson, Clanford L, MD   acetaminophen (TYLENOL) tablet 650 mg, 650 mg, Oral, Q6H PRN **OR** acetaminophen (TYLENOL) suppository 650 mg, 650 mg, Rectal, Q6H PRN, Adefeso, Oladapo, DO   ALPRAZolam (XANAX) tablet 0.25 mg, 0.25 mg, Oral, QHS, Adefeso, Oladapo, DO, 0.25 mg at 03/14/22 2138   [COMPLETED] aspirin tablet 325 mg, 325 mg, Oral, Once, 325 mg at 03/14/22 1104 **FOLLOWED BY** aspirin chewable tablet 81 mg, 81 mg, Oral, Daily, [place] Eric J, DO, 81 mg at 03/15/22 1110   atorvastatin (LIPITOR) tablet 40 mg, 40 mg, Oral, Daily, [place] Donnamarie Poag, DO, 40 mg at 03/15/22 1110   clopidogrel (PLAVIX) tablet 75 mg, 75 mg, Oral, Daily, Lora Havens, MD, 75 mg at 03/15/22 1110   HYDROcodone-acetaminophen (NORCO/VICODIN) 5-325 MG per tablet 0.5 tablet, 0.5 tablet, Oral, BID, Adefeso, Oladapo, DO, 0.5 tablet at 03/15/22 1110   methocarbamol (ROBAXIN) tablet 500 mg, 500 mg, Oral, BID, Adefeso, Oladapo, DO, 500 mg at 03/15/22 1110   nicotine (NICODERM CQ - dosed in mg/24 hours) patch 21 mg, 21 mg, Transdermal, Daily, [place] Eric J, DO, 21 mg at 03/15/22 1112   ondansetron (ZOFRAN) tablet 4 mg, 4 mg, Oral, Q6H PRN **OR** ondansetron (ZOFRAN) injection 4 mg, 4 mg, Intravenous, Q6H PRN, Adefeso, Oladapo, DO   polyvinyl alcohol (LIQUIFILM TEARS) 1.4 % ophthalmic solution 1 drop, 1 drop, Both Eyes, QID PRN, Wynetta Emery, Clanford L,  MD   Patients Current Diet:  Diet Order                  DIET DYS 3 Room service appropriate? Yes; Fluid consistency: Thin; Fluid restriction: 1500 mL Fluid  Diet effective now                         Precautions / Restrictions Precautions Precautions: Fall Restrictions Weight Bearing Restrictions: No    Has the patient had 2 or more falls or a fall with injury in the past year? Yes    Prior Activity Level Community (5-7x/wk): Pt. was acitve in the community PTA   Prior Functional Level Self Care: Did the patient need help bathing, dressing, using the toilet or eating? Independent   Indoor Mobility: Did the  patient need assistance with walking from room to room (with or without device)? Independent   Stairs: Did the patient need assistance with internal or external stairs (with or without device)? Independent   Functional Cognition: Did the patient need help planning regular tasks such as shopping or remembering to take medications? Independent   Patient Information Are you of Hispanic, Latino/a,or Spanish origin?: A. No, not of Hispanic, Latino/a, or Spanish origin What is your race?: A. White Do you need or want an interpreter to communicate with a doctor or health care staff?: 0. No   Patient's Response To:  Health Literacy and Transportation Is the patient able to respond to health literacy and transportation needs?: Yes Health Literacy - How often do you need to have someone help you when you read instructions, pamphlets, or other written material from your doctor or pharmacy?: Never In the past 12 months, has lack of transportation kept you from medical appointments or from getting medications?: No In the past 12 months, has lack of transportation kept you from meetings, work, or from getting things needed for daily living?: No   Home Assistive Devices / [place] Devices/Equipment: Environmental consultant (specify type), Shower chair with back, Grab bars around  toilet, Raised toilet seat with rails, Bedside commode/3-in-1 Home Equipment: Conservation officer, nature (2 wheels), Transport chair, [place] - single point, BSC/3in1, Civil engineer, contracting, Grab bars - tub/shower   Prior Device Use: Indicate devices/aids used by the patient prior to current illness, exacerbation or injury? None of the above   Current Functional Level Cognition   Overall Cognitive Status: Within Functional Limits for tasks assessed Orientation Level: Oriented X4    Extremity Assessment (includes Sensation/Coordination)   Upper Extremity Assessment: Defer to OT evaluation RUE Deficits / Details: 3-/5 shoulder flexion; 4-/5 elbow flexion; 3+/5 elbow extension, poor wrist mobility at baseline due to arthritis. 2+/5 grip. Severe arthritis in B UE is a confounding variable at this time. RUE Sensation: WNL RUE Coordination: decreased fine motor, decreased gross motor LUE Deficits / Details: Generally weak. Severe arthritis in B UE with more severity noted in wrist and digits.  Lower Extremity Assessment: Generalized weakness, RLE deficits/detail RLE Deficits / Details: grossly -4/5 RLE Sensation: decreased light touch RLE Coordination: decreased gross motor     ADLs   Overall ADL's : Needs assistance/impaired Eating/Feeding: Minimal assistance, Sitting Grooming: Minimal assistance, Sitting Upper Body Bathing: Minimal assistance Lower Body Bathing: Maximal assistance, Moderate assistance, Sitting/lateral leans Upper Body Dressing : Minimal assistance Lower Body Dressing: Maximal assistance, Sitting/lateral leans Toilet Transfer: Minimal assistance, Ambulation, Rolling walker (2 wheels) Toilet Transfer Details (indicate cue type and reason): Simulated via ambulation in the hall and return to bed. Toileting- Clothing Manipulation and Hygiene: Minimal assistance, Moderate assistance, Sitting/lateral lean Tub/ Shower Transfer: Minimal assistance, Ambulation, Rolling walker (2 wheels) Functional  mobility during ADLs: Minimal assistance, Moderate assistance, Rolling walker (2 wheels) General ADL Comments: Able to complete x2 reps of ~8 feet of ambulation forward and backwards with use of RW. Rest breaks seated at EOB between attempts.     Mobility   Overal bed mobility: Needs Assistance Bed Mobility: Supine to Sit Supine to sit: Min assist, Mod assist General bed mobility comments: Increased time; labored movement; two hand held assist to scoot to EOB.     Transfers   Overall transfer level: Needs assistance Equipment used: Rolling walker (2 wheels) Transfers: Sit to/from Stand, Bed to chair/wheelchair/BSC Sit to Stand: Min assist Bed to/from chair/wheelchair/BSC transfer type:: Step pivot Step pivot  transfers: Mod assist General transfer comment: Partially simulated via steps from EOB with min to moderate assistance. Unsteady and difficulty coordinating R LE.     Ambulation / Gait / Stairs / Wheelchair Mobility   Ambulation/Gait Ambulation/Gait assistance: Min assist, Mod assist Gait Distance (Feet): 30 Feet Assistive device: Rolling walker (2 wheels) Gait Pattern/deviations: Decreased step length - right, Decreased step length - left, Decreased stride length, Decreased dorsiflexion - right General Gait Details: slow labored cadence with difficulty advancing RLE due weakness, required increased time for making turns using RW and limited due to fatigue Gait velocity: decreased     Posture / Balance Dynamic Sitting Balance Sitting balance - Comments: seated at EOB Balance Overall balance assessment: Needs assistance Sitting-balance support: Feet supported, No upper extremity supported Sitting balance-Leahy Scale: Fair Sitting balance - Comments: seated at EOB Standing balance support: During functional activity, No upper extremity supported Standing balance-Leahy Scale: Poor Standing balance comment: poor using RW     Special needs/care consideration Special service needs  none     Previous Home Environment (from acute therapy documentation) Living Arrangements: Other relatives Available Help at Discharge: Family, Available 24 hours/day Type of Home: House Home Layout: One level Home Access: Ramped entrance Bathroom Shower/Tub: Chiropodist: Handicapped height Bathroom Accessibility: Yes How Accessible: Accessible via walker, Accessible via wheelchair [place]: No   Discharge Living Setting Plans for Discharge Living Setting: Patient's home Type of Home at Discharge: House Discharge Home Layout: One level Discharge Home Access: [place] entrance Discharge Bathroom Shower/Tub: [place] unit Discharge Bathroom Toilet: Handicapped height Discharge Bathroom Accessibility: Yes How Accessible: Accessible via walker Does the patient have any problems obtaining your medications?: No   Social/Family/Support Systems Patient Roles: Other (Comment) Contact Information: Darnell Level (nephew) Anticipated Caregiver: [phone] Anticipated Caregiver's Contact Information: Min A, will not help with bathing or dressing. Ability/Limitations of Caregiver: 24/7 Caregiver Availability: 24/7 Discharge Plan Discussed with Primary Caregiver: Yes Is Caregiver In Agreement with Plan?: Yes Does Caregiver/Family have Issues with Lodging/Transportation while Pt is in Rehab?: Yes   Goals Patient/Family Goal for Rehab: PT Supervision, OT mod I, SLP mod I,  ELOS 14-17 days Program Orientation Provided & Reviewed with Pt/Caregiver Including Roles  & Responsibilities: Yes   Decrease burden of Care through IP rehab admission: none    Possible need for SNF placement upon discharge: not anticipated     Patient Condition: I have reviewed medical records from [facility] , spoken with CM, and patient and family member. I discussed via phone for inpatient rehabilitation assessment.  Patient will benefit from ongoing PT, OT, and SLP, can actively  participate in 3 hours of therapy a day 5 days of the week, and can make measurable gains during the admission.  Patient will also benefit from the coordinated team approach during an Inpatient Acute Rehabilitation admission.  The patient will receive intensive therapy as well as Rehabilitation physician, nursing, social worker, and care management interventions.  Due to safety, skin/wound care, disease management, medication administration, pain management, and patient education the patient requires 24 hour a day rehabilitation nursing.  The patient is currently Min A - mod A  with mobility and basic ADLs.  Discharge setting and therapy post discharge at home with home health is anticipated.  Patient has agreed to participate in the Acute Inpatient Rehabilitation Program and will admit today.   Preadmission Screen Completed By:  Genella Mech, 03/15/2022 2:18 PM ______________________________________________________________________   Discussed status with Dr. Naaman Plummer  on 03/20/22 at 930  and received approval for admission today.   Admission Coordinator:  Genella Mech, CCC-SLP, time 1000/Date 01/19/23    Assessment/Plan: Diagnosis: left thalamocapsular infarct Does the need for close, 24 hr/day Medical supervision in concert with the patient's rehab needs make it unreasonable for this patient to be served in a less intensive setting? Yes Co-Morbidities requiring supervision/potential complications: RA, SIAD, leukemia Due to bladder management, bowel management, safety, skin/wound care, disease management, medication administration, pain management, and patient education, does the patient require 24 hr/day rehab nursing? Yes Does the patient require coordinated care of a physician, rehab nurse, PT, OT, and SLP to address physical and functional deficits in the context of the above medical diagnosis(es)? Yes Addressing deficits in the following areas: balance, endurance, locomotion, strength,  transferring, bowel/bladder control, bathing, dressing, feeding, grooming, toileting, cognition, and psychosocial support Can the patient actively participate in an intensive therapy program of at least 3 hrs of therapy 5 days a week? Yes The potential for patient to make measurable gains while on inpatient rehab is excellent Anticipated functional outcomes upon discharge from inpatient rehab: supervision PT, modified independent OT, modified independent SLP Estimated rehab length of stay to reach the above functional goals is: 14-17 days Anticipated discharge destination: Home 10. Overall Rehab/Functional Prognosis: good     MD Signature: Meredith Staggers, MD, [place] Director Rehabilitation Services 03/20/2022

## 2022-03-20 NOTE — Progress Notes (Signed)
Patient arrived from [place] around 1300, accompanied by her niece.assigmned to [phone]

## 2022-03-20 NOTE — Care Management Important Message (Signed)
Important Message  Patient Details  Name: Sheri Pope MRN: [id number] Date of Birth: 04-04-1940   Medicare Important Message Given:  Yes     Tommy Medal 03/20/2022, 12:32 PM

## 2022-03-20 NOTE — H&P (Signed)
Physical Medicine and Rehabilitation Admission H&P    Chief Complaint  Patient presents with   Weakness  : HPI: Sheri Pope is a 82 year old right-handed female with history of hyperlipidemia, severe/advanced rheumatoid arthritis and maintained on 1/2 tablet of hydrocodone 5/325 mg every 6 hours as needed, right total hip replacement 2007 and left total knee replacement 2013 and multiple surgeries to hands and feet related to rheumatoid arthritis, tobacco use as well as history of leukemia followed by Dr Derek Jack and refused current treatment.  Per chart review lives in a 1 level home with ramped entrance.  Community ambulator without assistive device.  She does not drive.  Independent with ADLs.  Her nephew and daughter providing support as needed.  Presented to [facility] 03/13/2022 with acute onset of right-sided weakness and slurred speech.  CT/MRI showed acute lacunar infarct at the left thalamocapsular junction as well as chronic small vessel ischemia including chronic lacunar infarcts of the left thalamus.  Patient did not receive tPA.  CT angiogram head and neck no emergent large vessel occlusion or stenosis.  Noted incidental findings of mildly enlarged and heterogeneous thyroid gland.  Ultrasound of the thyroid showed heterogeneous and borderline enlarged thyroid, compatible with medical thyroid disease/goiter and no suspicious nodules seen.  Admission chemistries unremarkable except sodium 129 glucose 106, urine drug screen positive opiates, urine osmolality 206.  Echocardiogram with ejection fraction of 50 to 55% no wall motion abnormalities grade 1 diastolic dysfunction.  Neurology follow-up currently maintained on low-dose aspirin as well as Plavix for CVA prophylaxis x 3 weeks then aspirin alone.  Hyponatremia improved with fluid restriction.  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  Therapy evaluations  completed due to patient decreased functional ability right-sided weakness as well as advanced rheumatoid arthritis was admitted for a comprehensive rehab program.  Review of Systems  Constitutional:  Negative for chills and fever.  HENT:  Negative for hearing loss.   Eyes:  Negative for blurred vision and double vision.  Respiratory:  Negative for cough and shortness of breath.   Cardiovascular:  Positive for leg swelling. Negative for chest pain and palpitations.  Gastrointestinal:  Positive for constipation. Negative for heartburn, nausea and vomiting.       GERD  Genitourinary:  Negative for dysuria, flank pain and hematuria.  Musculoskeletal:  Positive for back pain, joint pain and myalgias.  Skin:  Negative for rash.  Neurological:  Positive for speech change and weakness.  Psychiatric/Behavioral:         Anxiety  All other systems reviewed and are negative.  Past Medical History:  Diagnosis Date   Arthritis    RA AND OA --PAIN AND SWELLIN IN LEFT KNEE   Blood in urine    NEGATIVE UROLOGY WORK UP --BUT ALWAYS HAS BLOOD IN URINE   GERD (gastroesophageal reflux disease)    H/O hiatal hernia    Hyperlipidemia    Leukemia (HCC)    PONV (postoperative nausea and vomiting)    ALWAYS SICK AFTER SURGERIES EXCEPT AFTER HIP REPLACMENT 2007   Vitamin D deficiency    Past Surgical History:  Procedure Laterality Date   ABDOMINAL HYSTERECTOMY  1870'S   BREAST SURGERY     BREAST BIOSPIES   CATARACT EXTRACTION W/PHACO Left 05/30/2012   Procedure: CATARACT EXTRACTION PHACO AND INTRAOCULAR LENS PLACEMENT ([place]);  Surgeon: Tonny Branch, MD;  Location: [facility];  Service: Ophthalmology;  Laterality: Left;  CDE: 16.99  CATARACT EXTRACTION W/PHACO Right 06/24/2012   Procedure: CATARACT EXTRACTION PHACO AND INTRAOCULAR LENS PLACEMENT ([facility]);  Surgeon: Tonny Branch, MD;  Location: [facility];  Service: Ophthalmology;  Laterality: Right;  CDE:17.92   COLONOSCOPY WITH PROPOFOL N/A 10/14/2020   Procedure:  COLONOSCOPY WITH PROPOFOL;  Surgeon: Daneil Dolin, MD;  Location: [facility];  Service: Endoscopy;  Laterality: N/A;  ASA III / 12:30   ESOPHAGOGASTRODUODENOSCOPY (EGD) WITH PROPOFOL N/A 10/14/2020   Procedure: ESOPHAGOGASTRODUODENOSCOPY (EGD) WITH PROPOFOL;  Surgeon: Daneil Dolin, MD;  Location: [facility];  Service: Endoscopy;  Laterality: N/A;   HIP SURGERY     JOINT REPLACEMENT  2007   RIGHT HIP REPLACEMENT   LEFT KNEE ARTHROSCOPY  AUG 2012   MALONEY DILATION  10/14/2020   Procedure: MALONEY DILATION;  Surgeon: Daneil Dolin, MD;  Location: [facility];  Service: Endoscopy;;   MULTIPLE ORTHOPEDIC SURGERIES     ON BOTH HANDS AND BOTH FEET, RIGHT ELBOW   SPLEENECTOMY  IN THE 70'S   FOR LARGE CYST   TOTAL KNEE ARTHROPLASTY  11/10/2011   Procedure: TOTAL KNEE ARTHROPLASTY;  Surgeon: Mcarthur Rossetti, MD;  Location: [facility];  Service: Orthopedics;  Laterality: Left;  Left Total Knee Arthroplasty   YAG LASER APPLICATION Bilateral    Dr. Geoffry Paradise   Family History  Problem Relation Age of Onset   Diabetes Sister    Macular degeneration Sister    Heart attack Mother    Stroke Father    Dementia Father    Cancer Brother    Healthy Sister    Healthy Sister    Diabetes Brother    Social History:  reports that she has been smoking cigarettes. She has a 50.00 pack-year smoking history. She has never used smokeless tobacco. She reports that she does not drink alcohol and does not use drugs. Allergies:  Allergies  Allergen Reactions   Codeine Nausea And Vomiting   Prednisone Other (See Comments)    Thought she was having a heart attack   Sulfa Antibiotics Other (See Comments)    unknown   Aleve [Naproxen] Anxiety    Jittery and hot flashes   Latex Rash   Medications Prior to Admission  Medication Sig Dispense Refill   ALPRAZolam (XANAX) 0.25 MG tablet Take 0.25 mg by mouth at bedtime.     HYDROcodone-acetaminophen (NORCO/VICODIN) 5-325 MG tablet Take 0.5 tablets by  mouth every 6 (six) hours as needed for moderate pain.     methocarbamol (ROBAXIN) 500 MG tablet Take 500 mg by mouth every 6 (six) hours as needed for muscle spasms.     Polyvinyl Alcohol-Povidone (REFRESH OP) Place 1 drop into both eyes 4 (four) times daily as needed (dry eyes).        Home: Home Living Family/patient expects to be discharged to:: Private residence Living Arrangements: Other relatives Available Help at Discharge: Family, Available 24 hours/day Type of Home: House Home Access: [place]: One level Bathroom Shower/Tub: Chiropodist: Handicapped height Bathroom Accessibility: Yes Home Equipment: Conservation officer, nature (2 wheels), Systems analyst, [employer] - single point, BSC/3in1, Civil engineer, contracting, Grab bars - tub/shower   Functional History: Prior Function Prior Level of Function : Independent/Modified Independent Mobility Comments: Community ambulator without AD. Does not drive. ADLs Comments: Independent ADL and IADL.  Functional Status:  Mobility: Bed Mobility Overal bed mobility: Needs Assistance Bed Mobility: Supine to Sit Supine to sit: Min assist, Mod assist General bed mobility comments: increased time, labored  movement Transfers Overall transfer level: Needs assistance Equipment used: Rolling walker (2 wheels) Transfers: Sit to/from Stand, Bed to chair/wheelchair/BSC Sit to Stand: Min assist Bed to/from chair/wheelchair/BSC transfer type:: Step pivot Step pivot transfers: Mod assist General transfer comment: unsteady labored movement with difficulty advancing RLE due to weakness Ambulation/Gait Ambulation/Gait assistance: Mod assist Gait Distance (Feet): 30 Feet Assistive device: Rolling walker (2 wheels) Gait Pattern/deviations: Decreased step length - right, Decreased step length - left, Decreased stride length, Decreased dorsiflexion - right, Antalgic, Shuffle General Gait Details: increased endurance/distance for taking  steps with improvement for holding onto walker with right hand, had frequent incoordination of RLE, followed with w/c for safety and limited mostly due to fatigue Gait velocity: decreased    ADL: ADL Overall ADL's : Needs assistance/impaired Eating/Feeding: Minimal assistance, Sitting Grooming: Minimal assistance, Sitting Upper Body Bathing: Minimal assistance Lower Body Bathing: Maximal assistance, Moderate assistance, Sitting/lateral leans Upper Body Dressing : Minimal assistance Lower Body Dressing: Maximal assistance, Sitting/lateral leans Toilet Transfer: Minimal assistance, Ambulation, Rolling walker (2 wheels) Toilet Transfer Details (indicate cue type and reason): Simulated via ambulation in the hall and return to bed. Toileting- Clothing Manipulation and Hygiene: Minimal assistance, Moderate assistance, Sitting/lateral lean Tub/ Shower Transfer: Minimal assistance, Ambulation, Rolling walker (2 wheels) Functional mobility during ADLs: Minimal assistance, Moderate assistance, Rolling walker (2 wheels) General ADL Comments: Able to complete x2 reps of ~8 feet of ambulation forward and backwards with use of RW. Rest breaks seated at EOB between attempts.  Cognition: Cognition Overall Cognitive Status: Within Functional Limits for tasks assessed Orientation Level: Oriented X4 Cognition Arousal/Alertness: Awake/alert Behavior During Therapy: WFL for tasks assessed/performed Overall Cognitive Status: Within Functional Limits for tasks assessed  Physical Exam: Blood pressure 133/60, pulse 81, temperature 97.9 F (36.6 C), temperature source Oral, resp. rate 16, height '5\' 1"'$  (1.549 m), weight 43 kg, SpO2 98 %. Physical Exam Constitutional:      Comments: Frail appearing  HENT:     Head: Normocephalic and atraumatic.     Right Ear: External ear normal.     Left Ear: External ear normal.     Nose: Nose normal.     Mouth/Throat:     Comments: dentures Eyes:     Extraocular  Movements: Extraocular movements intact.     Pupils: Pupils are equal, round, and reactive to light.  Neck:     Comments: Thyroid enlargement Cardiovascular:     Rate and Rhythm: Normal rate and regular rhythm.     Heart sounds: No murmur heard.    No gallop.  Pulmonary:     Effort: Pulmonary effort is normal. No respiratory distress.     Breath sounds: No stridor. No wheezing or rales.  Abdominal:     General: Bowel sounds are normal. There is no distension.     Palpations: Abdomen is soft.  Musculoskeletal:     Cervical back: Neck supple.     Comments: Severe rheumatoid changes to hands and feet with deformities most prominent in the hands. Multiple rheumatoid nodules on all 4 limbs.   Skin:    General: Skin is warm and dry.     Comments: Left TKA, right THA scars  Neurological:     Mental Status: She is alert.     Comments: Alert and oriented x 3. Normal insight and awareness. Intact Memory. Normal language and speech. Cranial nerve exam unremarkable. RUE 3+/5 prox to distal. LUE 4+/5. RLE 4 to 4+/5. LLE 4 to 4+/5. No focal sensory abnl. DTR's  1+ to trace. No abnl resting tone.   Psychiatric:     Comments: Very pleasant and engaging!     No results found for this or any previous visit (from the past 48 hour(s)). No results found.    Blood pressure 133/60, pulse 81, temperature 97.9 F (36.6 C), temperature source Oral, resp. rate 16, height '5\' 1"'$  (1.549 m), weight 43 kg, SpO2 98 %.  Medical Problem List and Plan: 1. Functional deficits secondary to left thalamocapsular infarction  -pt with hx of severe polyarticular RA. Very active despite jt disease  -patient may  shower  -ELOS/Goals: 14-17 days, mod I mobility/cognition and supervision with self-care  2.  Antithrombotics: -DVT/anticoagulation:  Mechanical: Antiembolism stockings, thigh (TED hose) Bilateral lower extremities  -antiplatelet therapy: Aspirin 81 mg daily and Plavix 75 mg daily x three weeks then aspirin  alone/ Initiated 03/14/2022  3. Pain Management: Hydrocodone as needed, Robaxin 500 mg twice daily 4. Mood/Behavior/Sleep: Xanax 0.25 mg nightly for sleep.  Provide emotional support  -antipsychotic agents: N/A 5. Neuropsych/cognition: This patient is capable of making decisions on her own behalf. 6. Skin/Wound Care: Routine skin checks 7. Fluids/Electrolytes/Nutrition: Routine in and outs with follow-up chemistries 8.  Hyperlipidemia.  Lipitor 9.  Severe/advanced rheumatoid arthritis with multiple joint replacements.  Follow-up outpatient.  -pain seems relatively controlled 10.  History of tobacco use.  NicoDerm patch.  Provide counseling 11.  History of leukemia.  Follow-up outpatient Dr.Katragadda 12.  Chronic hyponatremia related to history of leukemia/SIADH.  Continue fluid restriction.  -f/u labs on admission 13.  Large thyroid gland.  Incidental finding of heterogeneous enlarged thyroid gland on CT angio head and neck.  Thyroid ultrasound consistent with goiter, no suspicious nodule seen.  Follow-up outpatient    Cathlyn Parsons, PA-C 03/20/2022

## 2022-03-20 NOTE — Progress Notes (Signed)
Inpatient Rehabilitation Admission Medication Review by a Pharmacist  A complete drug regimen review was completed for this patient to identify any potential clinically significant medication issues.  High Risk Drug Classes Is patient taking? Indication by Medication  Antipsychotic No   Anticoagulant No   Antibiotic No   Opioid Yes Norco - RA pain  Antiplatelet Yes ASA, Plavix - CVA prophylaxis  Hypoglycemics/insulin No   Vasoactive Medication No   Chemotherapy No   Other Yes Alprazolam - anxiety Atorvastatin - HLD Robaxin - RA Nicotine patch - tobacco cessation     Type of Medication Issue Identified Description of Issue Recommendation(s)  Drug Interaction(s) (clinically significant)     Duplicate Therapy     Allergy     No Medication Administration End Date   DAPT x 3 weeks then ASA alone (stop date for Plavix entered)  Incorrect Dose     Additional Drug Therapy Needed     Significant med changes from prior encounter (inform family/care partners about these prior to discharge).    Other       Clinically significant medication issues were identified that warrant physician communication and completion of prescribed/recommended actions by midnight of the next day:  No  Name of provider notified for urgent issues identified:   Provider Method of Notification:     Pharmacist comments: home medications resumed  Time spent performing this drug regimen review (minutes):  [place], PharmD, BCPS Clinical Pharmacist Clinical phone for 03/20/2022 is [phone] 03/20/2022 1:46 PM

## 2022-03-20 NOTE — Progress Notes (Signed)
Inpatient Rehab Admissions Coordinator:    I have a CIR bed for this pt. And can admit her today. RN may call report to [phone].    Clemens Catholic, [place], [place] Admissions Coordinator  [phone] ([place]) [phone] (office)

## 2022-03-20 NOTE — Discharge Summary (Signed)
Physician Discharge Summary  Sheri Pope YTK:[id number] DOB: Dec 18, 1940 DOA: 03/13/2022  PCP: Celene Squibb, MD  Admit date: 03/13/2022 Discharge date: 03/20/2022  Disposition: Inpatient Rehab (CIR)  Recommendations for Outpatient Follow-up:  Follow up with PCP in 2 weeks Follow up with neurologist at [facility] in 10 weeks  Take aspirin and plavix x 3 weeks followed by aspirin alone Stop smoking cigarettes   Discharge Condition: STABLE   CODE STATUS: FULL DIET:  Dysphagia 3, heart healthy   Brief Hospitalization Summary: Please see all hospital notes, images, labs for full details of the hospitalization. ADMISSION HPI:  Sheri Pope is a 82 y.o. female with past medical history significant for hyperlipidemia, severe/advanced rheumatoid arthritis, osteoarthritis, history of leukemia who presented to [facility], ED on 1/15 with right-sided weakness.  Patient reports onset at roughly 11 AM.  EMS was initially activated, blood sugar was noted to be high in the 200 range and patient refused transport to the ED at that time.  In the afternoon/evening, symptoms continue to progress with increased difficulty being able to ambulate.  EMS was then reactivated patient was brought to ED for further evaluation.   In the ED, temperature 98.0 F, HR 81, RR 23, BP 162/78, SpO2 95% on room air.  WBC 7.1, hemoglobin 13.7, platelets 225.  Sodium 130, potassium 3.7, chloride 99, CO2 22, glucose 107, BUN 10, creatinine 0.48.  AST 16, ALT 15, total bilirubin 0.3.  Urinalysis unrevealing.  EtOH level less than 10.  UDS positive for opiates.  CT head without contrast with no evidence for acute intracranial abnormality, mild chronic small vessel ischemic changes of the white matter.  CT angiogram head/neck with no emergent large vessel occlusion or significantly hemodynamic stenosis of the head/neck.  TRH consulted for admission for further evaluation and management for concern for acute CVA.  HOSPITAL  COURSE BY PROBLEM   Acute CVA Patient presenting to ED with right-sided weakness.  MR brain without contrast with acute lacunar infarct left thalamocapsular junction, small chronic vessel ischemia including chronic lacunar infarcts left thalamus.  UDS positive for opiates, otherwise unrevealing.  CT a head/neck with no large philtral occlusion.  TTE with LVEF 55-73%, grade 1 diastolic dysfunction, no aortic stenosis, no atrial level shunt detected.  LDL 98, hemoglobin A1c 5.7.  Seen by neurology with recommendations of aspirin 81 mg p.o. daily, Plavix 75 mg p.o. daily for 3 weeks followed by aspirin alone.  Started on atorvastatin 40 mg p.o. daily.  PT/OT recommend CIR placement on discharge. -- Continue DAPT aspirin 81 mg p.o. daily, Plavix 75 mg p.o. daily x 3 weeks followed by aspirin alone -- Atorvastatin 40 mg p.o. 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 -- checked a thyroid US to further work up - [place] consistent with goiter, no suspicious nodules seen.     Severe rheumatoid arthritis/osteoarthritis -- Hydrocodone BID -- Robaxin BID   Anxiety: -- Xanax 0.25 mg p.o. nightly   Tobacco use disorder Counseled on need for complete cessation. -- Nicotine patch  Discharge Diagnoses:  Principal Problem:   Right sided weakness Active Problems:   Rheumatoid arthritis ([place])   Hyponatremia   Insomnia   Anxiety   Osteoarthritis   Tobacco abuse   Discharge Instructions:  Discharge Instructions     Ambulatory referral to Neurology   Complete by: As directed    An appointment is requested in approximately: 10 weeks      Allergies as of 03/20/2022       Reactions   Codeine Nausea And Vomiting   Prednisone Other (See  Comments)   Thought she was having a heart attack   Sulfa Antibiotics Other (See Comments)   unknown   Aleve [naproxen] Anxiety   Jittery and hot flashes   Latex Rash        Medication List     TAKE these medications    ALPRAZolam 0.25 MG tablet Commonly known as: XANAX Take 0.25 mg by mouth at bedtime.   aspirin 81 MG chewable tablet Chew 1 tablet (81 mg total) by mouth daily. Start taking on: March 21, 2022   atorvastatin 40 MG tablet Commonly known as: LIPITOR Take 1 tablet (40 mg total) by mouth daily. Start taking on: March 21, 2022   clopidogrel 75 MG tablet Commonly known as: PLAVIX Take 1 tablet (75 mg total) by mouth daily for 14 days. Start taking on: March 21, 2022   HYDROcodone-acetaminophen 5-325 MG tablet Commonly known as: NORCO/VICODIN Take 0.5 tablets by mouth every 6 (six) hours as needed for moderate pain.   methocarbamol 500 MG tablet Commonly known as: ROBAXIN Take 500 mg by mouth every 6 (six) hours as needed for muscle spasms.   nicotine 21 mg/24hr patch Commonly known as: NICODERM CQ - dosed in mg/24 hours Place 1 patch (21 mg total) onto the skin daily. Start taking on: March 21, 2022   REFRESH OP Place 1 drop into both eyes 4 (four) times daily as needed (dry eyes).        Follow-up Information     [facility]. Schedule an appointment as soon as possible for a visit in 10 week(s).   Specialty: Neurology Why: Hospital Follow Up Contact information: [address] [place] [phone]        Celene Squibb, MD. Schedule an appointment as soon as possible for a visit in 2 week(s).   Specialty: Internal Medicine Why: Hospital Follow Up Contact information: [facility] [address] [phone]                Allergies  Allergen Reactions   Codeine Nausea And Vomiting   Prednisone Other (See Comments)    Thought she was  having a heart attack   Sulfa Antibiotics Other (See Comments)    unknown   Aleve [Naproxen] Anxiety    Jittery and hot flashes   Latex Rash   Allergies as of 03/20/2022       Reactions   Codeine Nausea And Vomiting   Prednisone Other (See Comments)   Thought she was having a heart attack   Sulfa Antibiotics Other (See Comments)   unknown   Aleve [naproxen] Anxiety   Jittery and hot flashes   Latex Rash        Medication List     TAKE these medications    ALPRAZolam 0.25 MG tablet Commonly known as: XANAX Take 0.25 mg by mouth at bedtime.   aspirin 81 MG chewable tablet Chew 1 tablet (81 mg total) by mouth daily. Start taking on: March 21, 2022   atorvastatin 40 MG tablet Commonly known as: LIPITOR Take 1 tablet (40 mg total) by mouth daily. Start taking on: March 21, 2022   clopidogrel 75 MG tablet Commonly known as: PLAVIX Take 1 tablet (75 mg total) by mouth daily for 14 days. Start taking on: March 21, 2022   HYDROcodone-acetaminophen 5-325 MG tablet Commonly known as: NORCO/VICODIN Take 0.5 tablets by mouth every 6 (six) hours as needed for moderate pain.   methocarbamol 500 MG tablet Commonly known as: ROBAXIN Take 500 mg by mouth every 6 (six) hours as needed for muscle spasms.   nicotine 21 mg/24hr patch Commonly known as: NICODERM CQ - dosed in mg/24 hours Place 1 patch (21 mg total) onto the skin daily. Start taking on: March 21, 2022   REFRESH OP Place 1 drop into both eyes 4 (four) times daily as needed (dry eyes).        Procedures/Studies: US THYROID  Result Date: 03/15/2022 CLINICAL DATA:  82 year old female with enlarged thyroid on CT EXAM: THYROID ULTRASOUND TECHNIQUE: Ultrasound examination of the thyroid gland and adjacent soft tissues was performed. COMPARISON:  CT 03/13/2022 FINDINGS: Parenchymal Echotexture: Markedly heterogenous Isthmus: 1.0 cm Right lobe: 5.2 cm x 3.0 cm x 2.4 cm Left lobe: 5.8 cm x 2.9 cm x 2.4 cm  _________________________________________________________ Estimated total number of nodules >/= 1 cm: 0 Number of spongiform nodules >/=  2 cm not described below (TR1): 0 Number of mixed cystic and solid nodules >/= 1.5 cm not described below (TR2): 0 _________________________________________________________ No discrete nodules are seen within the thyroid gland. No adenopathy IMPRESSION: Heterogeneous and borderline enlarged thyroid, compatible with medical thyroid disease/goiter Electronically Signed   By: Corrie Mckusick D.O.   On: 03/15/2022 15:38   ECHOCARDIOGRAM COMPLETE  Result Date: 03/14/2022    ECHOCARDIOGRAM REPORT   Patient Name:   KAMBER VIGNOLA Date of Exam: 03/14/2022 Medical Rec #:  [id number]        Height:       61.0 in Accession #:    [id number]       Weight:       94.8 lb Date of Birth:  01-22-1941         BSA:          1.375 m Patient Age:    63 years         BP:           185/82 mmHg Patient Gender: F                HR:           77 bpm. Exam Location:  [facility] Procedure: 2D Echo, Cardiac Doppler and Color Doppler Indications:    Stroke  History:        Patient has no prior history of Echocardiogram examinations.                 Stroke; Risk Factors:Current Smoker.  Sonographer:    Wenda Low Referring Phys: [id number] OLADAPO ADEFESO  Sonographer Comments: Image acquisition challenging due to respiratory motion. IMPRESSIONS  1. Left ventricular ejection fraction, by estimation, is 50 to 55%. The left ventricle has low normal function. The left ventricle has no regional wall motion abnormalities. There is mild left ventricular hypertrophy. Left ventricular diastolic parameters are consistent with Grade I diastolic dysfunction (impaired relaxation).  2. Right ventricular systolic function is normal. The right ventricular size is normal. Tricuspid regurgitation signal is inadequate for assessing PA pressure.  3. The mitral valve is normal in structure. Trivial mitral valve regurgitation.  No evidence of mitral stenosis.  4. The aortic valve is  tricuspid. There is moderate calcification of the aortic valve. There is moderate thickening of the aortic valve. Aortic valve regurgitation is not visualized. No aortic stenosis is present.  5. The inferior vena cava is normal in size with greater than 50% respiratory variability, suggesting right atrial pressure of 3 mmHg. FINDINGS  Left Ventricle: Left ventricular ejection fraction, by estimation, is 50 to 55%. The left ventricle has low normal function. The left ventricle has no regional wall motion abnormalities. The left ventricular internal cavity size was normal in size. There is mild left ventricular hypertrophy. Left ventricular diastolic parameters are consistent with Grade I diastolic dysfunction (impaired relaxation). Normal left ventricular filling pressure. Right Ventricle: The right ventricular size is normal. Right vetricular wall thickness was not well visualized. Right ventricular systolic function is normal. Tricuspid regurgitation signal is inadequate for assessing PA pressure. Left Atrium: Left atrial size was normal in size. Right Atrium: Right atrial size was normal in size. Pericardium: There is no evidence of pericardial effusion. Mitral Valve: The mitral valve is normal in structure. There is mild thickening of the mitral valve leaflet(s). There is mild calcification of the mitral valve leaflet(s). Mild mitral annular calcification. Trivial mitral valve regurgitation. No evidence  of mitral valve stenosis. MV peak gradient, 3.1 mmHg. The mean mitral valve gradient is 1.0 mmHg. Tricuspid Valve: The tricuspid valve is normal in structure. Tricuspid valve regurgitation is trivial. No evidence of tricuspid stenosis. Aortic Valve: The aortic valve is tricuspid. There is moderate calcification of the aortic valve. There is moderate thickening of the aortic valve. There is moderate aortic valve annular calcification. Aortic valve  regurgitation is not visualized. No aortic stenosis is present. Aortic valve mean gradient measures 2.0 mmHg. Aortic valve peak gradient measures 3.9 mmHg. Aortic valve area, by VTI measures 2.03 cm. Pulmonic Valve: The pulmonic valve was not well visualized. Pulmonic valve regurgitation is not visualized. No evidence of pulmonic stenosis. Aorta: The aortic root is normal in size and structure. Venous: The inferior vena cava is normal in size with greater than 50% respiratory variability, suggesting right atrial pressure of 3 mmHg. IAS/Shunts: No atrial level shunt detected by color flow Doppler.  LEFT VENTRICLE PLAX 2D LVIDd:         4.40 cm   Diastology LVIDs:         3.20 cm   LV e' medial:    6.85 cm/s LV PW:         1.10 cm   LV E/e' medial:  8.9 LV IVS:        1.10 cm   LV e' lateral:   6.53 cm/s LVOT diam:     1.90 cm   LV E/e' lateral: 9.3 LV SV:         47 LV SV Index:   34 LVOT Area:     2.84 cm  RIGHT VENTRICLE RV Basal diam:  3.35 cm RV Mid diam:    2.50 cm RV S prime:     12.50 cm/s TAPSE (M-mode): 2.2 cm LEFT ATRIUM             Index        RIGHT ATRIUM           Index LA diam:        3.30 cm 2.40 cm/m   RA Area:     13.70 cm LA Vol (A2C):   46.2 ml 33.59 ml/m  RA Volume:   36.10 ml  26.25 ml/m LA Vol (A4C):  35.8 ml 26.03 ml/m LA Biplane Vol: 41.5 ml 30.18 ml/m  AORTIC VALVE                    PULMONIC VALVE AV Area (Vmax):    2.40 cm     PV Vmax:       0.90 m/s AV Area (Vmean):   1.99 cm     PV Peak grad:  3.3 mmHg AV Area (VTI):     2.03 cm AV Vmax:           99.20 cm/s AV Vmean:          67.700 cm/s AV VTI:            0.231 m AV Peak Grad:      3.9 mmHg AV Mean Grad:      2.0 mmHg LVOT Vmax:         84.10 cm/s LVOT Vmean:        47.500 cm/s LVOT VTI:          0.165 m LVOT/AV VTI ratio: 0.71  AORTA Ao Root diam: 2.80 cm MITRAL VALVE MV Area (PHT): 3.23 cm    SHUNTS MV Area VTI:   2.15 cm    Systemic VTI:  0.16 m MV Peak grad:  3.1 mmHg    Systemic Diam: 1.90 cm MV Mean grad:  1.0 mmHg  MV Vmax:       0.88 m/s MV Vmean:      48.1 cm/s MV Decel Time: 235 msec MV E velocity: 60.70 cm/s MV A velocity: 76.80 cm/s MV E/A ratio:  0.79 Carlyle Dolly MD Electronically signed by Carlyle Dolly MD Signature Date/Time: 03/14/2022/1:32:39 PM    Final    MR BRAIN WO CONTRAST  Result Date: 03/14/2022 CLINICAL DATA:  Generalized increased weakness EXAM: MRI HEAD WITHOUT CONTRAST TECHNIQUE: Multiplanar, multiecho pulse sequences of the brain and surrounding structures were obtained without intravenous contrast. COMPARISON:  Head CT and CTA from yesterday. FINDINGS: Brain: Acute lacunar infarct at the left internal capsule/thalamus. Chronic small vessel ischemia in the cerebral white matter. Chronic lacunar infarcts in the left thalamus. No acute hemorrhage, hydrocephalus, or masslike finding. Preserved brain volume. Vascular: Preserved arterial flow voids. Patent dural sinuses on recent CTA. Skull and upper cervical spine: No focal marrow lesion. Sinuses/Orbits: No acute finding. IMPRESSION: 1. Acute lacunar infarct at the left thalamocapsular junction. 2. Chronic small vessel ischemia including chronic lacunar infarcts at the left thalamus. Electronically Signed   By: Jorje Guild M.D.   On: 03/14/2022 07:58   CT ANGIO HEAD NECK W WO CM  Result Date: 03/13/2022 CLINICAL DATA:  Stroke/TIA EXAM: CT ANGIOGRAPHY HEAD AND NECK TECHNIQUE: Multidetector CT imaging of the head and neck was performed using the standard protocol during bolus administration of intravenous contrast. Multiplanar CT image reconstructions and MIPs were obtained to evaluate the vascular anatomy. Carotid stenosis measurements (when applicable) are obtained utilizing NASCET criteria, using the distal internal carotid diameter as the denominator. RADIATION DOSE REDUCTION: This exam was performed according to the departmental dose-optimization program which includes automated exposure control, adjustment of the mA and/or kV according to  patient size and/or use of iterative reconstruction technique. CONTRAST:  7m OMNIPAQUE IOHEXOL 350 MG/ML SOLN COMPARISON:  None Available. FINDINGS: CTA NECK FINDINGS SKELETON: There is no bony spinal canal stenosis. No lytic or blastic lesion. OTHER NECK: Mildly enlarged and heterogeneous thyroid gland UPPER CHEST: Biapical paraseptal emphysema AORTIC ARCH: There is calcific atherosclerosis of the aortic arch.  There is no aneurysm, dissection or hemodynamically significant stenosis of the visualized portion of the aorta. Conventional 3 vessel aortic branching pattern. The visualized proximal subclavian arteries are widely patent. RIGHT CAROTID SYSTEM: No dissection, occlusion or aneurysm. Mild atherosclerotic calcification at the carotid bifurcation without hemodynamically significant stenosis. LEFT CAROTID SYSTEM: No dissection, occlusion or aneurysm. Mild atherosclerotic calcification at the carotid bifurcation without hemodynamically significant stenosis. VERTEBRAL ARTERIES: Right dominant configuration. Both origins are clearly patent. There is no dissection, occlusion or flow-limiting stenosis to the skull base (V1-V3 segments). CTA HEAD FINDINGS POSTERIOR CIRCULATION: --Vertebral arteries: Normal V4 segments. --Inferior cerebellar arteries: Normal. --Basilar artery: Normal. --Superior cerebellar arteries: Normal. --Posterior cerebral arteries (PCA): Normal. ANTERIOR CIRCULATION: --Intracranial internal carotid arteries: Normal. --Anterior cerebral arteries (ACA): Normal. Both A1 segments are present. Patent anterior communicating artery (a-comm). --Middle cerebral arteries (MCA): Normal. VENOUS SINUSES: As permitted by contrast timing, patent. ANATOMIC VARIANTS: None Review of the MIP images confirms the above findings. IMPRESSION: 1. No emergent large vessel occlusion or hemodynamically significant stenosis of the head or neck. 2. Mild bilateral carotid bifurcation atherosclerosis without hemodynamically  significant stenosis. 3. Mildly enlarged and heterogeneous thyroid gland. Incidental heterogeneous and enlarged thyroid. Recommend non-emergent thyroid ultrasound. Reference: J Am Coll Radiol. 2015 Feb;12(2): 143-50 Aortic Atherosclerosis (ICD10-I70.0) and Emphysema (ICD10-J43.9). Electronically Signed   By: Ulyses Jarred M.D.   On: 03/13/2022 23:50   CT HEAD WO CONTRAST  Result Date: 03/13/2022 CLINICAL DATA:  Generalized weakness for 3 days EXAM: CT HEAD WITHOUT CONTRAST TECHNIQUE: Contiguous axial images were obtained from the base of the skull through the vertex without intravenous contrast. RADIATION DOSE REDUCTION: This exam was performed according to the departmental dose-optimization program which includes automated exposure control, adjustment of the mA and/or kV according to patient size and/or use of iterative reconstruction technique. COMPARISON:  None Available. FINDINGS: Brain: No acute territorial infarction, hemorrhage or intracranial mass. Probable chronic lacunar infarct left thalamus. Mild chronic small vessel ischemic changes of the white matter. Nonenlarged ventricles Vascular: No hyperdense vessels.  Carotid vascular calcification Skull: Normal. Negative for fracture or focal lesion. Sinuses/Orbits: No acute finding. Other: None IMPRESSION: 1. No CT evidence for acute intracranial abnormality. 2. Mild chronic small vessel ischemic changes of the white matter. Electronically Signed   By: Donavan Foil M.D.   On: 03/13/2022 21:29     Subjective: Pt reports that she is eager to get to CIR to start her rehabilitation.   Discharge Exam: Vitals:   03/20/22 0500 03/20/22 0848  BP: 133/60 (!) 149/61  Pulse: 81 92  Resp: 16 18  Temp: 97.9 F (36.6 C) 98.5 F (36.9 C)  SpO2: 98% 96%   Vitals:   03/19/22 0555 03/19/22 1513 03/20/22 0500 03/20/22 0848  BP: (!) 150/68 (!) 142/61 133/60 (!) 149/61  Pulse: 81 84 81 92  Resp: '16  16 18  '$ Temp: 98.1 F (36.7 C) 97.8 F (36.6 C) 97.9 F  (36.6 C) 98.5 F (36.9 C)  TempSrc: Oral Oral Oral Oral  SpO2: 94% 97% 98% 96%  Weight:      Height:        GEN: NAD, alert and oriented x 3, chronically ill appearance, appears stated age HEENT: NCAT, PERRL, EOMI, sclera clear, MMM PULM: CTAB w/o wheezes/crackles, normal respiratory effort CV: RRR w/o M/G/R GI: abd soft, NTND, NABS, no R/G/M MSK: severe debilitating arthritic changes esp in hands PSYCH: normal mood/affect Integumentary: dry/intact, no rashes or wounds   The results of significant diagnostics from this  hospitalization (including imaging, microbiology, ancillary and laboratory) are listed below for reference.     Microbiology: No results found for this or any previous visit (from the past 240 hour(s)).   Labs: BNP (last 3 results) No results for input(s): "BNP" in the last 8760 hours. Basic Metabolic Panel: Recent Labs  Lab 03/13/22 2113 03/14/22 0631 03/15/22 0419 03/16/22 0418 03/17/22 0329  NA 129* 130* 127* 129*  --   K 3.8 3.7 3.7 3.3* 3.7  CL 98 99 97* 101  --   CO2 '24 22 24 22  '$ --   GLUCOSE 106* 107* 102* 105*  --   BUN '13 10 12 10  '$ --   CREATININE 0.57 0.48 0.56 0.54  --   CALCIUM 9.0 8.8* 8.7* 8.3*  --   MG  --  2.0  --  1.7 2.4  PHOS  --  3.1  --   --   --    Liver Function Tests: Recent Labs  Lab 03/13/22 2113 03/14/22 0631  AST 19 16  ALT 16 15  ALKPHOS 115 111  BILITOT 0.7 0.3  PROT 8.3* 8.1  ALBUMIN 3.5 3.3*   No results for input(s): "LIPASE", "AMYLASE" in the last 168 hours. No results for input(s): "AMMONIA" in the last 168 hours. CBC: Recent Labs  Lab 03/13/22 2113 03/14/22 0631  WBC 7.3 7.1  NEUTROABS 0.1*  --   HGB 14.0 13.7  HCT 40.1 40.0  MCV 99.0 99.0  PLT 226 225   Cardiac Enzymes: No results for input(s): "CKTOTAL", "CKMB", "CKMBINDEX", "TROPONINI" in the last 168 hours. BNP: Invalid input(s): "POCBNP" CBG: Recent Labs  Lab 03/14/22 0007 03/15/22 1119 03/15/22 1627  GLUCAP 105* 106* 109*    D-Dimer No results for input(s): "DDIMER" in the last 72 hours. Hgb A1c No results for input(s): "HGBA1C" in the last 72 hours. Lipid Profile No results for input(s): "CHOL", "HDL", "LDLCALC", "TRIG", "CHOLHDL", "LDLDIRECT" in the last 72 hours. Thyroid function studies No results for input(s): "TSH", "T4TOTAL", "T3FREE", "THYROIDAB" in the last 72 hours.  Invalid input(s): "FREET3" Anemia work up No results for input(s): "VITAMINB12", "FOLATE", "FERRITIN", "TIBC", "IRON", "RETICCTPCT" in the last 72 hours. Urinalysis    Component Value Date/Time   COLORURINE STRAW (A) 03/13/2022 2147   APPEARANCEUR CLEAR 03/13/2022 2147   LABSPEC 1.005 03/13/2022 2147   PHURINE 6.0 03/13/2022 2147   GLUCOSEU NEGATIVE 03/13/2022 2147   HGBUR NEGATIVE 03/13/2022 2147   BILIRUBINUR NEGATIVE 03/13/2022 2147   BILIRUBINUR small (A) 05/31/2021 [place] 03/13/2022 2147   PROTEINUR NEGATIVE 03/13/2022 2147   UROBILINOGEN 0.2 05/31/2021 1039   UROBILINOGEN 0.2 11/03/2011 1012   NITRITE NEGATIVE 03/13/2022 2147   LEUKOCYTESUR NEGATIVE 03/13/2022 2147   Sepsis Labs Recent Labs  Lab 03/13/22 2113 03/14/22 0631  WBC 7.3 7.1   Microbiology No results found for this or any previous visit (from the past 240 hour(s)).  Time coordinating discharge: 38 mins   SIGNED:  Irwin Brakeman, MD  [facility] 03/20/2022, 10:34 AM How to contact the [facility] Attending or Consulting provider [place] or covering provider during after hours [place], for this patient?  Check the care team in [facility] and look for a) attending/consulting [facility] provider listed and b) the [facility] team listed Log into [email or web address] and use [place]'s universal password to access. If you do not have the password, please contact the hospital operator. Locate the [facility] provider you are looking for under [facility] and page to  a number that you can be directly reached. If you still have difficulty reaching the  provider, please page the [facility] (Director on Call) for the Hospitalists listed on amion for assistance.

## 2022-03-21 DIAGNOSIS — I6381 Other cerebral infarction due to occlusion or stenosis of small artery: Secondary | ICD-10-CM | POA: Diagnosis not present

## 2022-03-21 LAB — CBC WITH DIFFERENTIAL/PLATELET
Abs Immature Granulocytes: 0 10*3/uL (ref 0.00–0.07)
Basophils Absolute: 0.1 10*3/uL (ref 0.0–0.1)
Basophils Relative: 2 %
Eosinophils Absolute: 0 10*3/uL (ref 0.0–0.5)
Eosinophils Relative: 1 %
HCT: 39.7 % (ref 36.0–46.0)
Hemoglobin: 13.6 g/dL (ref 12.0–15.0)
Lymphocytes Relative: 89 %
Lymphs Abs: 4.3 10*3/uL — ABNORMAL HIGH (ref 0.7–4.0)
MCH: 34 pg (ref 26.0–34.0)
MCHC: 34.3 g/dL (ref 30.0–36.0)
MCV: 99.3 fL (ref 80.0–100.0)
Monocytes Absolute: 0 10*3/uL — ABNORMAL LOW (ref 0.1–1.0)
Monocytes Relative: 1 %
Neutro Abs: 0.3 10*3/uL — CL (ref 1.7–7.7)
Neutrophils Relative %: 7 %
Platelets: 265 10*3/uL (ref 150–400)
RBC: 4 MIL/uL (ref 3.87–5.11)
RDW: 16 % — ABNORMAL HIGH (ref 11.5–15.5)
WBC: 4.8 10*3/uL (ref 4.0–10.5)
nRBC: 0 % (ref 0.0–0.2)

## 2022-03-21 LAB — COMPREHENSIVE METABOLIC PANEL
ALT: 22 U/L (ref 0–44)
AST: 24 U/L (ref 15–41)
Albumin: 2.9 g/dL — ABNORMAL LOW (ref 3.5–5.0)
Alkaline Phosphatase: 90 U/L (ref 38–126)
Anion gap: 7 (ref 5–15)
BUN: 12 mg/dL (ref 8–23)
CO2: 26 mmol/L (ref 22–32)
Calcium: 9 mg/dL (ref 8.9–10.3)
Chloride: 99 mmol/L (ref 98–111)
Creatinine, Ser: 0.72 mg/dL (ref 0.44–1.00)
GFR, Estimated: >60 mL/min (ref >60)
Glucose, Bld: 142 mg/dL — ABNORMAL HIGH (ref 70–99)
Potassium: 3.4 mmol/L — ABNORMAL LOW (ref 3.5–5.1)
Sodium: 132 mmol/L — ABNORMAL LOW (ref 135–145)
Total Bilirubin: 0.3 mg/dL (ref 0.3–1.2)
Total Protein: 7.5 g/dL (ref 6.5–8.1)

## 2022-03-21 MED ORDER — POLYETHYLENE GLYCOL 3350 17 G PO PACK
17.0000 g | PACK | Freq: Every day | ORAL | Status: DC
  Filled 2022-03-21 (×2): qty 1

## 2022-03-21 NOTE — Progress Notes (Signed)
Inpatient Rehabilitation  Patient information reviewed and entered into eRehab system by Yancy Knoble M. Felisa Zechman, M.A., CCC/SLP, PPS Coordinator.  Information including medical coding, functional ability and quality indicators will be reviewed and updated through discharge.    

## 2022-03-21 NOTE — Progress Notes (Signed)
Inpatient [facility]ri Pope Statement of Services  Patient Name:  [name]  Date:  03/21/2022  Welcome to the [facility].  Our goal is to provide you with an individualized program based on your diagnosis and situation, designed to meet your specific needs.  With this comprehensive rehabilitation program, you will be expected to participate in at least 3 hours of rehabilitation therapies Monday-Friday, with modified therapy programming on the weekends.  Your rehabilitation program will include the following services:  Physical Therapy (PT), Occupational Therapy (OT), Speech Therapy (ST), 24 hour per day rehabilitation nursing, Therapeutic Recreaction (TR), Neuropsychology, Care Coordinator, Rehabilitation Medicine, Nutrition Services, Pharmacy Services, and Other  Weekly team conferences will be held on Wednesdays to discuss your progress.  Your Inpatient Rehabilitation Care Coordinator will talk with you frequently to get your input and to update you on team discussions.  Team conferences with you and your family in attendance may also be held.  Expected length of stay: 14-17 Days  Overall anticipated outcome:  Supervision-MOD I  Depending on your progress and recovery, your program may change. Your Inpatient Rehabilitation Care Coordinator will coordinate services and will keep you informed of any changes. Your Inpatient Rehabilitation Care Coordinator's name and contact numbers are listed  below.  The following services may also be recommended but are not provided by the [facility]:   [place] [facility] will be made to provide these services after discharge if needed.  Arrangements include referral to agencies that provide these services.  Your insurance has been verified to be:   [employer] Your primary doctor is:  Allyn Kenner, MD  Pertinent  information will be shared with your doctor and your insurance company.  Inpatient Rehabilitation Care Coordinator:  Erlene Quan, [place] or [phone]  Information discussed with and copy given to patient by: [name], 03/21/2022, 12:04 PM

## 2022-03-21 NOTE — Progress Notes (Signed)
PROGRESS NOTE   Subjective/Complaints: 82 year old female with severe RA, was independent at home with assisted devices.  Now having decline in function after her stroke. No new issues overnight Getting speech evaluation for higher-level cognition Review of systems noted for chest pain shortness of breath nausea vomiting diarrhea   Objective:   No results found. No results for input(s): "WBC", "HGB", "HCT", "PLT" in the last 72 hours. No results for input(s): "NA", "K", "CL", "CO2", "GLUCOSE", "BUN", "CREATININE", "CALCIUM" in the last 72 hours.  Intake/Output Summary (Last 24 hours) at 03/21/2022 0829 Last data filed at 03/21/2022 0425 Gross per 24 hour  Intake 200 ml  Output 50 ml  Net 150 ml        Physical Exam: Vital Signs Blood pressure 139/75, pulse 86, temperature 98 F (36.7 C), temperature source Oral, resp. rate 18, height '5\' 1"'$  (1.549 m), weight 40.9 kg, SpO2 97 %.   General: No acute distress Mood and affect are appropriate Heart: Regular rate and rhythm no rubs murmurs or extra sounds Lungs: Clear to auscultation, breathing unlabored, no rales or wheezes Abdomen: Positive bowel sounds, soft nontender to palpation, nondistended Extremities: No clubbing, cyanosis, or edema Skin: No evidence of breakdown, no evidence of rash Neurologic: Cranial nerves II through XII intact, motor strength is /5 in bilateral deltoid, bicep, tricep, grip, hip flexor, knee extensors, ankle dorsiflexor and plantar flexor Sensory exam normal sensation to light touch and proprioception in bilateral upper and lower extremities Cerebellar exam normal finger to nose to finger as well as heel to shin in bilateral upper and lower extremities Musculoskeletal: Rheumatoid joint deformities in both hands with ulnar deviation at the MCPs and PIPs.  Feet show no significant joint deformities.  Knees have full range of motion.  Healed incision  from prior TKR on the left side. Shoulder range of motion is within functional limits.  Elbow range of motion within functional limits Rheumatoid nodules on bilateral Achilles tendons.  Assessment/Plan: 1. Functional deficits which require 3+ hours per day of interdisciplinary therapy in a comprehensive inpatient rehab setting. Physiatrist is providing close team supervision and 24 hour management of active medical problems listed below. Physiatrist and rehab team continue to assess barriers to discharge/monitor patient progress toward functional and medical goals  Care Tool:  Bathing              Bathing assist       Upper Body Dressing/Undressing Upper body dressing        Upper body assist      Lower Body Dressing/Undressing Lower body dressing            Lower body assist       Toileting Toileting    Toileting assist       Transfers Chair/bed transfer  Transfers assist           Locomotion Ambulation   Ambulation assist              Walk 10 feet activity   Assist           Walk 50 feet activity   Assist           Walk 150  feet activity   Assist           Walk 10 feet on uneven surface  activity   Assist           Wheelchair     Assist               Wheelchair 50 feet with 2 turns activity    Assist            Wheelchair 150 feet activity     Assist          Blood pressure 139/75, pulse 86, temperature 98 F (36.7 C), temperature source Oral, resp. rate 18, height '5\' 1"'$  (1.549 m), weight 40.9 kg, SpO2 97 %.  Medical Problem List and Plan: 1. 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 Very active despite jt disease             -patient may  shower             -ELOS/Goals: 14-17 days, mod I mobility/cognition and supervision with self-care  2.  Antithrombotics: -DVT/anticoagulation:  Mechanical:  Antiembolism stockings, thigh (TED hose) Bilateral lower extremities             -antiplatelet therapy: Aspirin 81 mg daily and Plavix 75 mg daily x three weeks then aspirin alone/ Initiated 03/14/2022  3. Pain Management: Hydrocodone as needed, Robaxin 500 mg twice daily 4. Mood/Behavior/Sleep: Xanax 0.25 mg nightly for sleep.  Provide emotional support             -antipsychotic agents: N/A 5. Neuropsych/cognition: This patient is capable of making decisions on her own behalf. 6. Skin/Wound Care: Routine skin checks 7. Fluids/Electrolytes/Nutrition: Routine in and outs with follow-up chemistries 8.  Hyperlipidemia.  Lipitor 9.  Severe/advanced rheumatoid arthritis with multiple joint replacements.  Follow-up outpatient.             -pain seems relatively controlled 10.  History of tobacco use.  NicoDerm patch.  Provide counseling 11.  History of leukemia.  Follow-up outpatient Dr.Katragadda 12.  Chronic hyponatremia related to history of leukemia/SIADH.  Continue fluid restriction.             -f/u labs on admission 13.  Large thyroid gland.  Incidental finding of heterogeneous enlarged thyroid gland on CT angio head and neck.  Thyroid ultrasound consistent with goiter, no suspicious nodule seen.  Follow-up outpatient  TSH mildly elevated but normal T4 f/u PCP   LOS: 1 days A FACE TO FACE EVALUATION WAS PERFORMED  Charlett Blake 03/21/2022, 8:29 AM

## 2022-03-21 NOTE — Evaluation (Signed)
Physical Therapy Assessment and Plan  Patient Details  Name: Sheri Pope MRN: [id number] Date of Birth: August 23, 1940  PT Diagnosis: Contracture of joint: hands, fingers, Difficulty walking, Hemiparesis dominant, and Muscle weakness Rehab Potential: Good ELOS: 10-14 days   Today's Date: 03/21/2022 PT Individual Time: 8592-9244 PT Individual Time Calculation (min): 67 min    Hospital Problem: Principal Problem:   Left thalamic infarction [facility])   Past Medical History:  Past Medical History:  Diagnosis Date   Arthritis    RA AND OA --PAIN AND SWELLIN IN LEFT KNEE   Blood in urine    NEGATIVE UROLOGY WORK UP --BUT ALWAYS HAS BLOOD IN URINE   GERD (gastroesophageal reflux disease)    H/O hiatal hernia    Hyperlipidemia    Leukemia ([place])    PONV (postoperative nausea and vomiting)    ALWAYS SICK AFTER SURGERIES EXCEPT AFTER HIP REPLACMENT 2007   Vitamin D deficiency    Past Surgical History:  Past Surgical History:  Procedure Laterality Date   ABDOMINAL HYSTERECTOMY  1870'S   BREAST SURGERY     BREAST BIOSPIES   CATARACT EXTRACTION W/PHACO Left 05/30/2012   Procedure: CATARACT EXTRACTION PHACO AND INTRAOCULAR LENS PLACEMENT ([place]);  Surgeon: Tonny Branch, MD;  Location: [facility] ORS;  Service: Ophthalmology;  Laterality: Left;  CDE: 16.99   CATARACT EXTRACTION W/PHACO Right 06/24/2012   Procedure: CATARACT EXTRACTION PHACO AND INTRAOCULAR LENS PLACEMENT ([facility]);  Surgeon: Tonny Branch, MD;  Location: [facility] ORS;  Service: Ophthalmology;  Laterality: Right;  CDE:17.92   COLONOSCOPY WITH PROPOFOL N/A 10/14/2020   Procedure: COLONOSCOPY WITH PROPOFOL;  Surgeon: Daneil Dolin, MD;  Location: [facility] ENDO SUITE;  Service: Endoscopy;  Laterality: N/A;  ASA III / 12:30   ESOPHAGOGASTRODUODENOSCOPY (EGD) WITH PROPOFOL N/A 10/14/2020   Procedure: ESOPHAGOGASTRODUODENOSCOPY (EGD) WITH PROPOFOL;  Surgeon: Daneil Dolin, MD;  Location: [facility];  Service: Endoscopy;  Laterality: N/A;   HIP SURGERY      JOINT REPLACEMENT  2007   RIGHT HIP REPLACEMENT   LEFT KNEE ARTHROSCOPY  AUG 2012   MALONEY DILATION  10/14/2020   Procedure: MALONEY DILATION;  Surgeon: Daneil Dolin, MD;  Location: [facility] SU[facility];  Service: Endoscopy;;   MULTIPLE ORTHOPEDIC SURGERIES     ON BOTH HANDS AND BOTH FEET, RIGHT ELBOW   SPLEENECTOMY  IN THE 70'S   FOR LARGE CYST   TOTAL KNEE ARTHROPLASTY  11/10/2011   Procedure: TOTAL KNEE ARTHROPLASTY;  Surgeon: Mcarthur Rossetti, MD;  Location: [facility] ORS;  Service: Orthopedics;  Laterality: Left;  Left Total Knee Arthroplasty   YAG LASER APPLICATION Bilateral    Dr. Geoffry Paradise    Assessment & Plan Clinical Impression: Patient is a 82 y.o.  right-handed female with history of hyperlipidemia, severe/advanced rheumatoid arthritis and maintained on 1/2 tablet of hydrocodone 5/325 mg every 6 hours as needed, right total hip replacement 2007 and left total knee replacement 2013 and multiple surgeries to hands and feet related to rheumatoid arthritis, tobacco use as well as history of leukemia followed by Dr Derek Jack and refused current treatment.  Per chart review lives in a 1 level home with ramped entrance.  Community ambulator without assistive device.  She does not drive.  Independent with ADLs.  Her nephew and daughter providing support as needed.  Presented to [facility]/13/2022 with acute onset of right-sided weakness and slurred speech.  CT/MRI showed acute lacunar infarct at the left thalamocapsular junction as well as chronic small vessel ischemia  including chronic lacunar infarcts of the left thalamus.  Patient did not receive tPA.  CT angiogram head and neck no emergent large vessel occlusion or stenosis.  Noted incidental findings of mildly enlarged and heterogeneous thyroid gland.  Ultrasound of the thyroid showed heterogeneous and borderline enlarged thyroid, compatible with medical thyroid disease/goiter and no suspicious nodules seen.  Admission chemistries  unremarkable except sodium 129 glucose 106, urine drug screen positive opiates, urine osmolality 206.  Echocardiogram with ejection fraction of 50 to 55% no wall motion abnormalities grade 1 diastolic dysfunction.  Neurology follow-up currently maintained on low-dose aspirin as well as Plavix for CVA prophylaxis x 3 weeks then aspirin alone.  Hyponatremia improved with fluid restriction.  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  Therapy evaluations completed due to patient decreased functional ability right-sided weakness as well as advanced rheumatoid arthritis was admitted for a comprehensive rehab program. .  Patient transferred to CIR on 03/20/2022 .   Patient currently requires min assist overall with mobility secondary to muscle weakness, decreased cardiorespiratoy endurance, unbalanced muscle activation and decreased motor planning, decreased safety awareness, and decreased standing balance, decreased balance strategies, and hemipareisis .  Prior to hospitalization, patient was modified independent  with mobility and lived with Family (live-in nephew (alcoholic)) in a House home.  Home access is  Ramped entrance.  Patient will benefit from skilled PT intervention to maximize safe functional mobility, minimize fall risk, and decrease caregiver burden for planned discharge home with 24 hour assist.  Anticipate patient will benefit from follow up OP at discharge.  PT - End of Session Activity Tolerance: Tolerates 10 - 20 min activity with multiple rests PT Assessment Rehab Potential (ACUTE/IP ONLY): Good PT Barriers to Discharge: Decreased caregiver support;Inaccessible home environment;Incontinence;Lack of/limited family support;Insurance for SNF coverage;Nutrition means PT Patient demonstrates impairments in the following area(s): Balance;Endurance;Motor;Nutrition;Perception;Safety;Sensory PT Transfers Functional Problem(s): Bed Mobility;Bed to  Chair;Car;Furniture PT Locomotion Functional Problem(s): Ambulation;Stairs PT Plan PT Intensity: Minimum of 1-2 x/day ,45 to 90 minutes PT Frequency: 5 out of 7 days PT Duration Estimated Length of Stay: 10-14 days PT Treatment/Interventions: Ambulation/gait training;Community reintegration;DME/adaptive equipment instruction;Neuromuscular re-education;Psychosocial support;Stair training;UE/LE Strength taining/ROM;Wheelchair propulsion/positioning;UE/LE Coordination activities;Therapeutic Activities;Skin care/wound management;Pain management;Functional electrical stimulation;Discharge planning;Balance/vestibular training;Cognitive remediation/compensation;Disease management/prevention;Functional mobility training;Patient/family education;Splinting/orthotics;Therapeutic Exercise;Visual/perceptual remediation/compensation PT Transfers Anticipated Outcome(s): Mod I/ supervision PT Locomotion Anticipated Outcome(s): supervision PT Recommendation Recommendations for Other Services: Therapeutic Recreation consult Therapeutic Recreation Interventions: Kitchen group;Stress management Follow Up Recommendations: Outpatient PT;24 hour supervision/assistance (Neuro Based OPPT) Patient destination: Home Equipment Recommended: To be determined   PT Evaluation Precautions/Restrictions Precautions Precautions: Fall Precaution Comments: R hemi weakness, severe RA General   Vital SignsTherapy Vitals Temp: 98.3 F (36.8 C) Temp Source: Oral Pulse Rate: 86 Resp: 16 BP: (!) 163/80 Patient Position (if appropriate): Sitting Oxygen Therapy SpO2: 97 % O2 Device: Room Air Pain Pain Assessment Pain Scale: 0-10 Pain Score: 0-No pain Pain Interference Pain Interference Pain Effect on Sleep: 2. Occasionally Pain Interference with Therapy Activities: 2. Occasionally Pain Interference with Day-to-Day Activities: 2. Occasionally Home Living/Prior Functioning Home Living Available Help at Discharge:  Family;Available 24 hours/day Type of Home: House Home Access: Ramped entrance Home Layout: One level Bathroom Shower/Tub: Chiropodist: Handicapped height  Lives With: Family (live-in nephew (alcoholic)) Prior Function Level of Independence: Independent with basic ADLs;Independent with gait;Independent with transfers;Independent with homemaking with ambulation  Able to Take Stairs?: Yes Driving: No Vocation: Retired Radiographer, therapeutic - History Ability to See  in Adequate Light: 0 Adequate Perception Perception: Within Functional Limits Praxis Praxis: Intact  Cognition Overall Cognitive Status: Within Functional Limits for tasks assessed Arousal/Alertness: Awake/alert Orientation Level: Oriented X4 Memory: Appears intact Awareness: Appears intact Problem Solving: Appears intact Safety/Judgment: Appears intact Sensation Sensation Light Touch: Appears Intact Coordination Gross Motor Movements are Fluid and Coordinated: No Fine Motor Movements are Fluid and Coordinated: No Coordination and Movement Description: B hands severely deformed by RA. CVA has resulted in weakness in RUE and RLE impacting coordination. Heel Shin Test: slight delay, WFL Motor  Motor Motor: Other (comment) (hemipareisis) Motor - Skilled Clinical Observations: demos learned movements to compensate for reduced mobility from severe RA, R side weaker than L side   Trunk/Postural Assessment  Cervical Assessment Cervical Assessment: Exceptions to [facility] (forward head) Thoracic Assessment Thoracic Assessment: Exceptions to [facility] (rounded shoulders with kyphotic posture) Lumbar Assessment Lumbar Assessment: Exceptions to [facility] (reduced lordosis to lumbar with post pelvic tilt) Postural Control Postural Control: Within Functional Limits  Balance Static Sitting Balance Static Sitting - Level of Assistance: 5: Stand by assistance Dynamic Sitting Balance Dynamic Sitting - Level of  Assistance: 5: Stand by assistance Static Standing Balance Static Standing - Level of Assistance: 4: Min assist Dynamic Standing Balance Dynamic Standing - Level of Assistance: 3: Mod assist Extremity Assessment      RLE Assessment RLE Assessment: Exceptions to [facility] RLE Strength Right Hip Flexion: 4-/5 Right Hip Extension: 4-/5 Right Hip ABduction: 4-/5 Right Hip ADduction: 4-/5 Right Knee Flexion: 4-/5 Right Knee Extension: 4/5 Right Ankle Dorsiflexion: 4-/5 Right Ankle Plantar Flexion: 4-/5 LLE Strength Left Hip Flexion: 4+/5 Left Hip Extension: 4/5 Left Hip ABduction: 4/5 Left Hip ADduction: 4/5 Left Knee Flexion: 4/5 Left Knee Extension: 4+/5 Left Ankle Dorsiflexion: 4/5 Left Ankle Plantar Flexion: 4/5  Care Tool Care Tool Bed Mobility Roll left and right activity   Roll left and right assist level: Supervision/Verbal cueing    Sit to lying activity   Sit to lying assist level: Contact Guard/Touching assist    Lying to sitting on side of bed activity   Lying to sitting on side of bed assist level: the ability to move from lying on the back to sitting on the side of the bed with no back support.: Contact Guard/Touching assist     Care Tool Transfers Sit to stand transfer   Sit to stand assist level: Contact Guard/Touching assist    Chair/bed transfer   Chair/bed transfer assist level: Minimal Assistance - Patient > 75%     Toilet transfer   Assist Level: Minimal Assistance - Patient > 75%    Car transfer   Car transfer assist level: Minimal Assistance - Patient > 75%;Contact Guard/Touching assist      Care Tool Locomotion Ambulation   Assist level: Minimal Assistance - Patient > 75% Assistive device: Walker-rolling Max distance: 105 ft  Walk 10 feet activity   Assist level: Supervision/Verbal cueing Assistive device: Walker-rolling   Walk 50 feet with 2 turns activity   Assist level: Minimal Assistance - Patient > 75% Assistive device:  Walker-rolling  Walk 150 feet activity Walk 150 feet activity did not occur: Safety/medical concerns      Walk 10 feet on uneven surfaces activity Walk 10 feet on uneven surfaces activity did not occur: Safety/medical concerns      Stairs Stair activity did not occur: Safety/medical concerns        Walk up/down 1 step activity Walk up/down 1 step or curb (drop down) activity  did not occur: Safety/medical concerns      Walk up/down 4 steps activity Walk up/down 4 steps activity did not occur: Safety/medical concerns      Walk up/down 12 steps activity Walk up/down 12 steps activity did not occur: Safety/medical concerns      Pick up small objects from floor        Wheelchair Is the patient using a wheelchair?: Yes Type of Wheelchair: Manual   Wheelchair assist level: Dependent - Patient 0% Max wheelchair distance: 300 ft  Wheel 50 feet with 2 turns activity   Assist Level: Dependent - Patient 0%  Wheel 150 feet activity   Assist Level: Dependent - Patient 0%    Refer to Care Plan for Long Term Goals  SHORT TERM GOAL WEEK 1 PT Short Term Goal 1 (Week 1): STG = LTG d/t ELOS  Recommendations for other services: Therapeutic Recreation  Kitchen group and Stress management  Skilled Therapeutic Intervention Mobility Bed Mobility Bed Mobility: Rolling Right;Right Sidelying to Sit;Supine to Sit;Sit to Supine Rolling Right: Supervision/verbal cueing Right Sidelying to Sit: Supervision/Verbal cueing Supine to Sit: Supervision/Verbal cueing Sit to Supine: Contact Guard/Touching assist Transfers Transfers: Sit to Stand;Stand to Sit;Squat Pivot Transfers Sit to Stand: Contact Guard/Touching assist Stand to Sit: Contact Guard/Touching assist Squat Pivot Transfers: Contact Guard/Touching assist;Minimal Assistance - Patient > 75% Transfer (Assistive device): Rolling walker Locomotion  Gait Ambulation: Yes Gait Assistance: Contact Guard/Touching assist Gait Distance (Feet):  105 Feet Assistive device: Rolling walker Gait Gait: Yes Gait Pattern: Decreased hip/knee flexion - right;Decreased weight shift to right;Narrow base of support Gait velocity: decreased Stairs / Additional Locomotion Stairs: No Wheelchair Mobility Wheelchair Mobility: No  Skilled Intervention: PT Evaluation completed; see above for results. PT educated patient in roles of PT vs OT, PT POC, rehab potential, rehab goals, and discharge recommendations along with recommendation for follow-up rehabilitation services. Individual treatment initiated:  Patient supine in bed upon PT arrival. Niece in room and leaving at start of session to allow for pt to focus. Patient alert and agreeable to PT session. No pain complaint during session.  Therapeutic Activity: Bed Mobility: Patient performed supine <> sit with extra time and supervision to complete to upright seated position. Provided verbal cues for technique. Transfers: Patient performed sit <> stand  with overall CGA although required MinA at start of session d/t increased time in lying with minimal mobility. Provided vc/tc for forward scoot prior to attempt to stand. Pt has acquired movement pattern resulting in placement of hands on RW for .  Car transfer performed with light MinA/ CGA for safe positioning and use of BUE to control descent to sit in proper position. Is able to bring BLE into footwell with no physical assist.   Gait Training:  Patient ambulated 105 feet using peds RW with increasing fatigue at end of distance. Pt pushed herself to walk length of hallway. Ambulated with slight postural flexion and RW height adjusted.   Neuromuscular Re-ed: NMR facilitated during session with focus on standing balance with reaching task and maintaining stance against mild perturbations. MinA provided to prevent LOB. NMR performed for improvements in motor control and coordination, balance, sequencing, judgement, and self confidence/ efficacy in  performing all aspects of mobility at highest level of independence.   Patient seated upright in w/c at end of session and requesting assist with toileting.   Pt left with NT assisting into bathroom to continue with bladder retraining as pt complains of purewick use at [facility] which  has created an incontinence issue with pt.    Discharge Criteria: Patient will be discharged from PT if patient refuses treatment 3 consecutive times without medical reason, if treatment goals not met, if there is a change in medical status, if patient makes no progress towards goals or if patient is discharged from hospital.  The above assessment, treatment plan, treatment alternatives and goals were discussed and mutually agreed upon: by patient  Alger Simons PT, DPT, CSRS 03/21/2022, 7:27 PM

## 2022-03-21 NOTE — Progress Notes (Signed)
Patient ID: Sheri Pope, female   DOB: 1940/04/14, 82 y.o.   MRN: [id number] Met with the patient to review current situation, rehab process , team conference and plan of care. Patient noted her body does not react well to medications however she feels that she needs adjustment of doses for xanax to help with insomnia, nicoderm patch is "not working" for smoking cessation; craving a cigarette on highest dose available. Not sleeping well in the bed; recliner to room, declined an air mattress.  Reviewed DOAC x 3 weeks then ASA solo along with other medications and dietary modification recommendations. Continue to follow along to address educational needs to facilitate preparation for discharge. Hopeful that she will only need HH at discharge as nephew Darnell Level will not be able to help with toileting/bathing and niece will not be able to come by daily. Margarito Liner

## 2022-03-21 NOTE — Plan of Care (Signed)
  Problem: Consults Goal: RH STROKE PATIENT EDUCATION Description: See Patient Education module for education specifics  Outcome: Progressing   Problem: RH SAFETY Goal: RH STG ADHERE TO SAFETY PRECAUTIONS W/ASSISTANCE/DEVICE Description: STG Adhere to Safety Precautions With cues Assistance/Device. Outcome: Progressing   Problem: RH PAIN MANAGEMENT Goal: RH STG PAIN MANAGED AT OR BELOW PT'S PAIN GOAL Description: < 4 with prns Outcome: Progressing   Problem: RH KNOWLEDGE DEFICIT Goal: RH STG INCREASE KNOWLEGDE OF HYPERLIPIDEMIA Description: Patient and family will be able to manage HLD with medications and dietary modifications using educational resources independently Outcome: Progressing Goal: RH STG INCREASE KNOWLEDGE OF STROKE PROPHYLAXIS Description: Patient and family will be able to manage secondary stroke risks with medications and dietary modifications using educational resources independently Outcome: Progressing

## 2022-03-21 NOTE — Progress Notes (Signed)
Lab reported a critical atypical neutrophil count of 0.3. Critical value called into PA Dan. No new orders at this moment. Sanda Linger, RN

## 2022-03-21 NOTE — Evaluation (Signed)
Occupational Therapy Assessment and Plan  Patient Details  Name: Sheri Pope MRN: [id number] Date of Birth: Mar 03, 1940  OT Diagnosis: hemiplegia affecting dominant side Rehab Potential: Rehab Potential (ACUTE ONLY): Good ELOS: 14-16 days   Today's Date: 03/21/2022 OT Individual Time: 0900-1020 OT Individual Time Calculation (min): 80 min     Hospital Problem: Principal Problem:   Left thalamic infarction [facility])   Past Medical History:  Past Medical History:  Diagnosis Date   Arthritis    RA AND OA --PAIN AND SWELLIN IN LEFT KNEE   Blood in urine    NEGATIVE UROLOGY WORK UP --BUT ALWAYS HAS BLOOD IN URINE   GERD (gastroesophageal reflux disease)    H/O hiatal hernia    Hyperlipidemia    Leukemia (HCC)    PONV (postoperative nausea and vomiting)    ALWAYS SICK AFTER SURGERIES EXCEPT AFTER HIP REPLACMENT 2007   Vitamin D deficiency    Past Surgical History:  Past Surgical History:  Procedure Laterality Date   ABDOMINAL HYSTERECTOMY  1870'S   BREAST SURGERY     BREAST BIOSPIES   CATARACT EXTRACTION W/PHACO Left 05/30/2012   Procedure: CATARACT EXTRACTION PHACO AND INTRAOCULAR LENS PLACEMENT ([place]);  Surgeon: Sheri Branch, MD;  Location: [facility];  Service: Ophthalmology;  Laterality: Left;  CDE: 16.99   CATARACT EXTRACTION W/PHACO Right 06/24/2012   Procedure: CATARACT EXTRACTION PHACO AND INTRAOCULAR LENS PLACEMENT ([facility]);  Surgeon: Sheri Branch, MD;  Location: [facility];  Service: Ophthalmology;  Laterality: Right;  CDE:17.92   COLONOSCOPY WITH PROPOFOL N/A 10/14/2020   Procedure: COLONOSCOPY WITH PROPOFOL;  Surgeon: Sheri Dolin, MD;  Location[facility];  Service: Endoscopy;  Laterality: N/A;  ASA III / 12:30   ESOPHAGOGASTRODUODENOSCOPY (EGD) WITH PROPOFOL N/A 10/14/2020   Procedure: ESOPHAGOGASTRODUODENOSCOPY (EGD) WITH PROPOFOL;  Surgeon: Sheri Dolin, MD;  [facility];  Service: Endoscopy;  Laterality: N/A;   HIP SURGERY     JOINT REPLACEMENT  2007   RIGHT  HIP REPLACEMENT   LEFT KNEE ARTHROSCOPY  AUG 2012   MALONEY DILATION  10/14/2020   Procedure: MALONEY DILATION;  Surgeon: Sheri Dolin, MD;  Location: [facility];  Service: Endoscopy;;   MULTIPLE ORTHOPEDIC SURGERIES     ON BOTH HANDS AND BOTH FEET, RIGHT ELBOW   SPLEENECTOMY  IN THE 70'S   FOR LARGE CYST   TOTAL KNEE ARTHROPLASTY  11/10/2011   Procedure: TOTAL KNEE ARTHROPLASTY;  Surgeon: Sheri Rossetti, MD;  Location: [facility];  Service: Orthopedics;  Laterality: Left;  Left Total Knee Arthroplasty   YAG LASER APPLICATION Bilateral    Dr. Geoffry Pope    Assessment & Plan Clinical Impression:  Sheri Pope is a 82 year old right-handed female with history of hyperlipidemia, severe/advanced rheumatoid arthritis and maintained on 1/2 tablet of hydrocodone 5/325 mg every 6 hours as needed, right total hip replacement 2007 and left total knee replacement 2013 and multiple surgeries to hands and feet related to rheumatoid arthritis, tobacco use as well as history of leukemia followed by Dr Sheri Pope and refused current treatment.  Per chart review lives in a 1 level home with ramped entrance.  Community ambulator without assistive device.  She does not drive.  Independent with ADLs.  Her nephew and daughter providing support as needed.  [facility]ed [facility] with acute onset of right-sided weakness and slurred speech.  CT/MRI showed acute lacunar infarct at the left thalamocapsular junction as well as chronic small vessel ischemia including chronic  lacunar infarcts of the left thalamus.  Patient did not receive tPA.  CT angiogram head and neck no emergent large vessel occlusion or stenosis.  Noted incidental findings of mildly enlarged and heterogeneous thyroid gland.  Ultrasound of the thyroid showed heterogeneous and borderline enlarged thyroid, compatible with medical thyroid disease/goiter and no suspicious nodules seen.  Admission chemistries unremarkable except  sodium 129 glucose 106, urine drug screen positive opiates, urine osmolality 206.  Echocardiogram with ejection fraction of 50 to 55% no wall motion abnormalities grade 1 diastolic dysfunction.  Neurology follow-up currently maintained on low-dose aspirin as well as Plavix for CVA prophylaxis x 3 weeks then aspirin alone.  Hyponatremia improved with fluid restriction.  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  Therapy evaluations completed due to patient decreased functional ability right-sided weakness as well as advanced rheumatoid arthritis was admitted for a comprehensive rehab program.   Patient transferred to CIR on 03/20/2022 .    Patient currently requires mod with basic self-care skills secondary to muscle joint tightness, decreased coordination, and decreased standing balance, hemiplegia, and decreased balance strategies.  Prior to hospitalization, patient could complete ADLs  with modified independent .  Patient will benefit from skilled intervention to increase independence with basic self-care skills prior to discharge home with care partner.  Anticipate patient will require intermittent supervision and follow up home health.  OT - End of Session Activity Tolerance: Tolerates 30+ min activity with multiple rests OT Assessment Rehab Potential (ACUTE ONLY): Good OT Barriers to Discharge: Decreased caregiver support OT Barriers to Discharge Comments: pt states her only caregiver his her nephew and he is not always available OT Patient demonstrates impairments in the following area(s): Balance;Motor;Endurance OT Basic ADL's Functional Problem(s): Bathing;Dressing;Toileting OT Advanced ADL's Functional Problem(s): Simple Meal Preparation;Light Housekeeping OT Transfers Functional Problem(s): Toilet;Tub/Shower OT Additional Impairment(s): None OT Plan OT Intensity: Minimum of 1-2 x/day, 45 to 90 minutes OT Frequency: 5 out of 7 days OT  Duration/Estimated Length of Stay: 14-16 days OT Treatment/Interventions: Teacher, English as a foreign language;Discharge planning;Functional mobility training;Neuromuscular re-education;Patient/family education;Psychosocial support;Self Care/advanced ADL retraining;Therapeutic Exercise;Therapeutic Activities;UE/LE Strength taining/ROM;UE/LE Coordination activities OT Self Feeding Anticipated Outcome(s): mod I OT Basic Self-Care Anticipated Outcome(s): supervision OT Toileting Anticipated Outcome(s): supervision OT Bathroom Transfers Anticipated Outcome(s): supervision OT Recommendation Patient destination: Home Follow Up Recommendations: Home health OT Equipment Recommended: Tub/shower bench   OT Evaluation Precautions/Restrictions  Precautions Precautions: Fall Restrictions Weight Bearing Restrictions: No   Pain Pain Assessment Pain Scale: 0-10 Pain Score: 0-No pain Home Living/Prior Functioning Home Living Family/patient expects to be discharged to:: Private residence Living Arrangements: Other relatives Available Help at Discharge: Family, Available 24 hours/day Type of Home: House Home Access: Ramped entrance Home Layout: One level Bathroom Shower/Tub: Chiropodist: Handicapped height  Lives With: Family Prior Function Level of Independence: Independent with basic ADLs, Independent with gait, Independent with transfers, Independent with homemaking with ambulation  Able to Take Stairs?: No Driving: No Vocation: Retired Surveyor, mining Baseline Vision/History: 1 Wears glasses (reading only) Ability to See in Adequate Light: 0 Adequate Patient Visual Report: No change from baseline Vision Assessment?: No apparent visual deficits Perception  Perception: Within Functional Limits Praxis Praxis: Intact Cognition Cognition Overall Cognitive Status: Within Functional Limits for tasks assessed Arousal/Alertness: Awake/alert Orientation  Level: Person;Place;Situation Person: Oriented Place: Oriented Situation: Oriented Memory: Appears intact Awareness: Appears intact Problem Solving: Appears intact Safety/Judgment: Appears intact Brief Interview for Mental Status (BIMS) Repetition of Three Words (First Attempt):  3 Temporal Orientation: Year: Correct Temporal Orientation: Month: Accurate within 5 days Temporal Orientation: Day: Correct Recall: "Sock": Yes, no cue required Recall: "Blue": Yes, no cue required Recall: "Bed": Yes, no cue required BIMS Summary Score: 15 Sensation Sensation Light Touch: Appears Intact Hot/Cold: Appears Intact Proprioception: Appears Intact Stereognosis: Impaired by gross assessment Coordination Gross Motor Movements are Fluid and Coordinated: No Fine Motor Movements are Fluid and Coordinated: No Coordination and Movement Description: B hands severely deformed by RA. CVA has resulted in weakness in RUE and RLE impacting coordination. Motor  Motor Motor: Hemiplegia Motor - Skilled Clinical Observations: mild R hemiplegia  Trunk/Postural Assessment  Postural Control Postural Control: Within Functional Limits  Balance Static Sitting Balance Static Sitting - Level of Assistance: 5: Stand by assistance Dynamic Sitting Balance Dynamic Sitting - Level of Assistance: 5: Stand by assistance Static Standing Balance Static Standing - Level of Assistance: 4: Min assist Dynamic Standing Balance Dynamic Standing - Level of Assistance: 3: Mod assist Extremity/Trunk Assessment RUE Assessment Active Range of Motion (AROM) Comments: WFL General Strength Comments: shoulder and elbow 3/5,  grasp and hand function very impaired due to RA deformities LUE Assessment Active Range of Motion (AROM) Comments: WFL General Strength Comments: 4-/5,grasp and hand function very impaired due to RA deformities  Care Tool Care Tool Self Care Eating   Eating Assist Level: Set up assist    Oral Care     Oral Care Assist Level: Set up assist    Bathing   Body parts bathed by patient: Right arm;Left arm;Chest;Abdomen;Front perineal area;Buttocks;Right upper leg;Left upper leg;Face Body parts bathed by helper: Right lower leg;Left lower leg   Assist Level: Minimal Assistance - Patient > 75%    Upper Body Dressing(including orthotics)   What is the patient wearing?: Pull over shirt   Assist Level: Set up assist    Lower Body Dressing (excluding footwear)   What is the patient wearing?: Incontinence brief;Pants Assist for lower body dressing: Minimal Assistance - Patient > 75%    Putting on/Taking off footwear   What is the patient wearing?: Non-skid slipper socks Assist for footwear: Total Assistance - Patient < 25%       Care Tool Toileting Toileting activity   Assist for toileting: Moderate Assistance - Patient 50 - 74%     Care Tool Bed Mobility Roll left and right activity   Roll left and right assist level: Supervision/Verbal cueing    Sit to lying activity        Lying to sitting on side of bed activity   Lying to sitting on side of bed assist level: the ability to move from lying on the back to sitting on the side of the bed with no back support.: Supervision/Verbal cueing     Care Tool Transfers Sit to stand transfer   Sit to stand assist level: Contact Guard/Touching assist    Chair/bed transfer   Chair/bed transfer assist level: Minimal Assistance - Patient > 75%     Toilet transfer   Assist Level: Minimal Assistance - Patient > 75%     Care Tool Cognition  Expression of Ideas and Wants Expression of Ideas and Wants: 4. Without difficulty (complex and basic) - expresses complex messages without difficulty and with speech that is clear and easy to understand  Understanding Verbal and Non-Verbal Content Understanding Verbal and Non-Verbal Content: 4. Understands (complex and basic) - clear comprehension without cues or repetitions   Memory/Recall Ability  Memory/Recall Ability : That he or  she is in a hospital/hospital unit;Current season   Refer to Care Plan for Long Term Goals  SHORT TERM GOAL WEEK 1 OT Short Term Goal 1 (Week 1): Pt will demonstrate improved balance to be able to pull pants over hips with CGA. OT Short Term Goal 2 (Week 1): Pt will don shoes with min A. OT Short Term Goal 3 (Week 1): Pt will be able to ambulate in kitchen with RW and retrieve items from shelves with CGA.  Recommendations for other services: None    Skilled Therapeutic Intervention ADL ADL Eating: Set up Grooming: Setup Upper Body Bathing: Setup Lower Body Bathing: Minimal assistance Upper Body Dressing: Setup Lower Body Dressing: Moderate assistance Toileting: Moderate assistance Toilet Transfer: Minimal assistance Toilet Transfer Method: Stand pivot Toilet Transfer Equipment: Bedside commode Mobility  Transfers Sit to Stand: Contact Guard/Touching assist Stand to Sit: Contact Guard/Touching assist  Pt seen for initial evaluation and ADL training with a focus on safe standing balance and functional mobility.  Pt declined a shower so worked on bathing at sink in [employer]. Pt worked on stand pivot transfers with RW, bathing and dressing at the sink.   Reviewed role of OT, POC, pt's goals, ELOS. Pt desires to go home as soon as possible. Explained that if she progresses quickly, she can go home faster.  Pt resting in bed with all needs met, alarm set.   Discharge Criteria: Patient will be discharged from OT if patient refuses treatment 3 consecutive times without medical reason, if treatment goals not met, if there is a change in medical status, if patient makes no progress towards goals or if patient is discharged from hospital.  The above assessment, treatment plan, treatment alternatives and goals were discussed and mutually agreed upon: by patient  [facility] 03/21/2022, 12:41 PM

## 2022-03-21 NOTE — Progress Notes (Signed)
Inpatient Rehabilitation Care Coordinator Assessment and Plan Patient Details  Name: Sheri Pope MRN: [id number] Date of Birth: 1940-09-28  Today's Date: 03/21/2022  Hospital Problems: Principal Problem:   Left thalamic infarction [facility])  Past Medical History:  Past Medical History:  Diagnosis Date   Arthritis    RA AND OA --PAIN AND SWELLIN IN LEFT KNEE   Blood in urine    NEGATIVE UROLOGY WORK UP --BUT ALWAYS HAS BLOOD IN URINE   GERD (gastroesophageal reflux disease)    H/O hiatal hernia    Hyperlipidemia    Leukemia ([place])    PONV (postoperative nausea and vomiting)    ALWAYS SICK AFTER SURGERIES EXCEPT AFTER HIP REPLACMENT 2007   Vitamin D deficiency    Past Surgical History:  Past Surgical History:  Procedure Laterality Date   ABDOMINAL HYSTERECTOMY  1870'S   BREAST SURGERY     BREAST BIOSPIES   CATARACT EXTRACTION W/PHACO Left 05/30/2012   Procedure: CATARACT EXTRACTION PHACO AND INTRAOCULAR LENS PLACEMENT ([place]);  Surgeon: Tonny Branch, MD;  Location: [facility];  Service: Ophthalmology;  Laterality: Left;  CDE: 16.99   CATARACT EXTRACTION W/PHACO Right 06/24/2012   Procedure: CATARACT EXTRACTION PHACO AND INTRAOCULAR LENS PLACEMENT ([facility]);  Surgeon: Tonny Branch, MD;  Location: [facility];  Service: Ophthalmology;  Laterality: Right;  CDE:17.92   COLONOSCOPY WITH PROPOFOL N/A 10/14/2020   Procedure: COLONOSCOPY WITH PROPOFOL;  Surgeon: Daneil Dolin, MD;  [facility];  Service: Endoscopy;  Laterality: N/A;  ASA III / 12:30   ESOPHAGOGASTRODUODENOSCOPY (EGD) WITH PROPOFOL N/A 10/14/2020   Procedure: ESOPHAGOGASTRODUODENOSCOPY (EGD) WITH PROPOFOL;  Surgeon: Daneil Dolin, MD;  [facility];  Service: Endoscopy;  Laterality: N/A;   HIP SURGERY     JOINT REPLACEMENT  2007   RIGHT HIP REPLACEMENT   LEFT KNEE ARTHROSCOPY  AUG 2012   MALONEY DILATION  10/14/2020   Procedure: MALONEY DILATION;  Surgeon: Daneil Dolin, MD;  [facility];  Service:  Endoscopy;;   MULTIPLE ORTHOPEDIC SURGERIES     ON BOTH HANDS AND BOTH FEET, RIGHT ELBOW   SPLEENECTOMY  IN THE 70'S   FOR LARGE CYST   TOTAL KNEE ARTHROPLASTY  11/10/2011   Procedure: TOTAL KNEE ARTHROPLASTY;  Surgeon: Mcarthur Rossetti, MD;  Location: [facility];  Service: Orthopedics;  Laterality: Left;  Left Total Knee Arthroplasty   YAG LASER APPLICATION Bilateral    Dr. Geoffry Paradise   Social History:  reports that she has been smoking cigarettes. She has a 50.00 pack-year smoking history. She has never used smokeless tobacco. She reports that she does not drink alcohol and does not use drugs.  Family / Support Systems Marital Status: Widow/Widower Children: n/a Other Supports: Niece and Nephew Anticipated Caregiver: Chong Sicilian [phone] and Alanson Puls [phone] Ability/Limitations of Caregiver: Supervision from [place]w (no assistance) and Niece to check in daily Caregiver Availability: 24/7 Family Dynamics: support from niece and nephew  Social History Preferred language: English Religion: Baptist Cultural Background: Patient lives with nephew (alcoholic, not providing assistance). Niece supports patient and able to check in on patient daily. Education: HS Health Literacy - How often do you need to have someone help you when you read instructions, pamphlets, or other written material from your doctor or pharmacy?: Sometimes Writes: Yes Employment Status: Unemployed Public relations account executive Issues: N/A Guardian/Conservator: [name]   Abuse/Neglect Abuse/Neglect Assessment Can Be Completed: Yes Physical Abuse: Denies Verbal Abuse: Denies Sexual Abuse: Denies Exploitation of patient/patient's resources: Denies Self-Neglect: Denies  Patient  response to: Social Isolation - How often do you feel lonely or isolated from those around you?: Never  Emotional Status Pt's affect, behavior and adjustment status: Patient pleasant, adjusting and ready to get back home using her RW. Recent  Psychosocial Issues: Coping Psychiatric History: n/a Substance Abuse History: n/a  Patient / Family Perceptions, Expectations & Goals Pt/Family understanding of illness & functional limitations: Yes niece at bedside Premorbid pt/family roles/activities: Independent previously, niece assisted with transporation for appointments, medications and groceries. Anticipated changes in roles/activities/participation: Patient plans to return home sup/modI. Nephew unable to provide assistance. Pt/family expectations/goals: sup  [employer]: None Premorbid Home Care/DME Agencies: Other (Comment) ([facility]w, shower chair, bsc, transport chair, spc) Transportation available at discharge: Niece able to transport Is the patient able to respond to transportation needs?: Yes In the past 12 months, has lack of transportation kept you from medical appointments or from getting medications?: No In the past 12 months, has lack of transportation kept you from meetings, work, or from getting things needed for daily living?: No Resource referrals recommended: Neuropsychology  Discharge Planning Living Arrangements: Other relatives Support Systems: Other relatives Type of Residence: Private residence ([place] entrance) Insurance Resources: Multimedia programmer (specify) Personnel officer Medicare) Financial Resources: Social Security Financial Screen Referred: No Living Expenses: Own Money Management: Patient Does the patient have any problems obtaining your medications?: No Home Management: Independent Patient/Family Preliminary Plans: Niece able to assist Care Coordinator Barriers to Discharge: Insurance for SNF coverage, Decreased caregiver support, Lack of/limited family support Care Coordinator Anticipated Follow Up Needs: HH/OP Expected length of stay: 14-17 Days  Clinical Impression SW met with patient and niece at bedside. Introduced self and explained role. Patient anticipates discharging back  home at supervision level. Nephew will not assist much especially with ADL but is in the home. Niece will be able to check on patient daily. Patient has ramped entrance to enter home. No additional questions or concerns.   [name] 03/21/2022, 12:39 PM

## 2022-03-21 NOTE — Plan of Care (Signed)
  Problem: RH Swallowing Goal: LTG Patient will consume least restrictive diet using compensatory strategies with assistance (SLP) Description: LTG:  Patient will consume least restrictive diet using compensatory strategies with assistance (SLP) Flowsheets (Taken 03/21/2022 1030) LTG: Pt Patient will consume least restrictive diet using compensatory strategies with assistance of (SLP): Modified Independent

## 2022-03-21 NOTE — Evaluation (Signed)
Speech Language Pathology Assessment and Plan  Patient Details  Name: Sheri Pope MRN: [id number] Date of Birth: 1940-11-30  SLP Diagnosis: Dysphagia  Rehab Potential: Excellent ELOS: 7-10 days for SLP    Today's Date: 03/21/2022 SLP Individual Time: 0803-0857 SLP Individual Time Calculation (min): 54 min   Hospital Problem: Principal Problem:   Left thalamic infarction [facility])  Past Medical History:  Past Medical History:  Diagnosis Date   Arthritis    RA AND OA --PAIN AND SWELLIN IN LEFT KNEE   Blood in urine    NEGATIVE UROLOGY WORK UP --BUT ALWAYS HAS BLOOD IN URINE   GERD (gastroesophageal reflux disease)    H/O hiatal hernia    Hyperlipidemia    Leukemia ([place])    PONV (postoperative nausea and vomiting)    ALWAYS SICK AFTER SURGERIES EXCEPT AFTER HIP REPLACMENT 2007   Vitamin D deficiency    Past Surgical History:  Past Surgical History:  Procedure Laterality Date   ABDOMINAL HYSTERECTOMY  1870'S   BREAST SURGERY     BREAST BIOSPIES   CATARACT EXTRACTION W/PHACO Left 05/30/2012   Procedure: CATARACT EXTRACTION PHACO AND INTRAOCULAR LENS PLACEMENT ([place]);  Surgeon: Tonny Branch, MD;  Location: [facility];  Service: Ophthalmology;  Laterality: Left;  CDE: 16.99   CATARACT EXTRACTION W/PHACO Right 06/24/2012   Procedure: CATARACT EXTRACTION PHACO AND INTRAOCULAR LENS PLACEMENT ([facility]);  Surgeon: Tonny Branch, MD;  Location: [facility];  Service: Ophthalmology;  Laterality: Right;  CDE:17.92   COLONOSCOPY WITH PROPOFOL N/A 10/14/2020   Procedure: COLONOSCOPY WITH PROPOFOL;  Surgeon: Daneil Dolin, MD;  Location: [facility];  Service: Endoscopy;  Laterality: N/A;  ASA III / 12:30   ESOPHAGOGASTRODUODENOSCOPY (EGD) WITH PROPOFOL N/A 10/14/2020   Procedure: ESOPHAGOGASTRODUODENOSCOPY (EGD) WITH PROPOFOL;  Surgeon: Daneil Dolin, MD;  Location: [facility];  Service: Endoscopy;  Laterality: N/A;   HIP SURGERY     JOINT REPLACEMENT  2007   RIGHT HIP REPLACEMENT   LEFT KNEE  ARTHROSCOPY  AUG 2012   MALONEY DILATION  10/14/2020   Procedure: MALONEY DILATION;  Surgeon: Daneil Dolin, MD;  Location: [facility];  Service: Endoscopy;;   MULTIPLE ORTHOPEDIC SURGERIES     ON BOTH HANDS AND BOTH FEET, RIGHT ELBOW   SPLEENECTOMY  IN THE 70'S   FOR LARGE CYST   TOTAL KNEE ARTHROPLASTY  11/10/2011   Procedure: TOTAL KNEE ARTHROPLASTY;  Surgeon: Mcarthur Rossetti, MD;  Location: [facility];  Service: Orthopedics;  Laterality: Left;  Left Total Knee Arthroplasty   YAG LASER APPLICATION Bilateral    Dr. Geoffry Paradise    Assessment / Plan / Recommendation Clinical Impression Patient is a 82 year old right-handed female with history of hyperlipidemia, severe/advanced rheumatoid arthritis, tobacco use as well as history of leukemia. Presented to [facility]/13/2022 with acute onset of right-sided weakness and slurred speech.  CT/MRI showed acute lacunar infarct at the left thalamocapsular junction as well as chronic small vessel ischemia including chronic lacunar infarcts of the left thalamus. Noted incidental findings of mildly enlarged and heterogeneous thyroid gland.  Ultrasound of the thyroid showed heterogeneous and borderline enlarged thyroid, compatible with medical thyroid disease/goiter and no suspicious nodules seen. Therapy evaluations completed with recommendations for a comprehensive rehab program. Patient admitted 03/20/22.  Upon arrival, patient was awake in bed and agreeable to treatment session. Patient had already consumed a small amount of her breakfast meal and declined any trials of solid textures. Patient reports good tolerance of current diet but  also reports history of esophageal related issues resulting in a dilation (that she reports made swallowing function worse) with frequent "strangling," even with saliva. Patient's oral-motor exam was unremarkable without overt s/s of aspiration observed with thin liquids via straw. Patient also observed taking multiple  pills at one time whole with liquids without overt s/s of aspiration. Recommend patient continue current diet with set-up assist needed due to RA.   Patient has a hoarse vocal quality which patient endorses is a change since admission, however, patient reports she is not "too concerned" as her current goals are to "walk" and "wipe my own butt." Patient's vocal quality does not impact her intelligibility and she is 100% intelligible at the conversation level.   Patient reports her current cognitive-linguistic functioning is at baseline. Patient's auditory comprehension and verbal expression appeared [facility] for all tasks assessed without evidence of word-finding deficits. During formal assessment, patient scored WFL on all subtests of the Cognistat.  During informal conversation, patient demonstrated appropriate attention, recall of functional information, selective attention, and anticipatory awareness regarding discharge planning.  Suspect patient is at her cognitive-linguistic baseline with SLP f/u warranted for 1-2 sessions for tolerance of current diet and potential diet upgrade if appropriate and per patient preference. Patient verbalized understanding and agreement.     Skilled Therapeutic Interventions          Administered a cognitive-linguistic evaluation and BSE, please see above for details.   SLP Assessment  Patient will need skilled Speech Lanaguage Pathology Services during CIR admission    Recommendations  SLP Diet Recommendations: Dysphagia 3 (Mech soft);Thin Liquid Administration via: Straw Medication Administration: Whole meds with liquid Supervision: Patient able to self feed Compensations: Minimize environmental distractions;Slow rate;Small sips/bites;Follow solids with liquid Postural Changes and/or Swallow Maneuvers: Seated upright 90 degrees;Upright 30-60 min after meal Oral Care Recommendations: Oral care BID Recommendations for Other Services: Neuropsych consult Patient  destination: Home Follow up Recommendations: None Equipment Recommended: None recommended by SLP    SLP Frequency 1 to 3 out of 7 days   SLP Duration  SLP Intensity  SLP Treatment/Interventions 7-10 days for SLP  Minumum of 1-2 x/day, 30 to 90 minutes  Dysphagia/aspiration precaution training;Internal/external aids;Therapeutic Activities;Environmental controls;Cueing hierarchy;Functional tasks;Patient/family education    Pain Pain Assessment Pain Score: 0-No pain  Prior Functioning Type of Home: House  Lives With: Family Available Help at Discharge: Family;Available 24 hours/day Vocation: Retired  Programmer, systems Overall Cognitive Status: Within Functional Limits for tasks assessed Arousal/Alertness: Awake/alert Orientation Level: Oriented X4 Memory: Appears intact Awareness: Appears intact Problem Solving: Appears intact Safety/Judgment: Appears intact  Comprehension Auditory Comprehension Overall Auditory Comprehension: Appears within functional limits for tasks assessed Expression Expression Primary Mode of Expression: Verbal Verbal Expression Overall Verbal Expression: Appears within functional limits for tasks assessed Written Expression Dominant Hand: Right Written Expression: Not tested Oral Motor Oral Motor/Sensory Function Overall Oral Motor/Sensory Function: Within functional limits Motor Speech Overall Motor Speech: Impaired Respiration: Within functional limits Phonation: Hoarse Resonance: Within functional limits Articulation: Within functional limitis Intelligibility: Intelligible Motor Planning: Witnin functional limits  Care Tool Care Tool Cognition Ability to hear (with hearing aid or hearing appliances if normally used Ability to hear (with hearing aid or hearing appliances if normally used): 0. Adequate - no difficulty in normal conservation, social interaction, listening to TV   Expression of Ideas and Wants Expression of Ideas  and Wants: 4. Without difficulty (complex and basic) - expresses complex messages without difficulty and with speech that is clear and  easy to understand   Understanding Verbal and Non-Verbal Content Understanding Verbal and Non-Verbal Content: 4. Understands (complex and basic) - clear comprehension without cues or repetitions  Memory/Recall Ability Memory/Recall Ability : That he or she is in a hospital/hospital unit;Current season   Bedside Swallowing Assessment General Date of Onset: 03/13/22 Previous Swallow Assessment: 1/16: Recommended Dys. 3 textures with thin liquids Diet Prior to this Study: Dysphagia 3 (soft);Thin liquids Temperature Spikes Noted: No Respiratory Status: Room air History of Recent Intubation: No Behavior/Cognition: Alert;Cooperative;Pleasant mood Oral Cavity - Dentition: Adequate natural dentition;Missing dentition Self-Feeding Abilities: Able to feed self;Needs set up Vision: Functional for self-feeding Patient Positioning: Upright in bed Baseline Vocal Quality: Hoarse Volitional Cough: Strong Volitional Swallow: Able to elicit  Ice Chips Not Tested  Thin Liquid Thin Liquid: Within functional limits Presentation: Straw;Cup;Self Fed Nectar Thick Nectar Thick Liquid: Not tested Honey Thick Honey Thick Liquid: Not tested Puree Puree: Not tested Solid Solid: Not tested Other Comments: declined solids but observed with multiple medications whole with thin BSE Assessment Suspected Esophageal Findings Suspected Esophageal Findings: Belching Risk for Aspiration Impact on safety and function: Mild aspiration risk Other Related Risk Factors: History of esophageal-related issues  Short Term Goals: Week 1: SLP Short Term Goal 1 (Week 1): STGs=LTGs due to ELOS  Refer to Care Plan for Long Term Goals  Recommendations for other services: Neuropsych  Discharge Criteria: Patient will be discharged from SLP if patient refuses treatment 3 consecutive times  without medical reason, if treatment goals not met, if there is a change in medical status, if patient makes no progress towards goals or if patient is discharged from hospital.  The above assessment, treatment plan, treatment alternatives and goals were discussed and mutually agreed upon: by patient  Tamira Ryland 03/21/2022, 10:32 AM

## 2022-03-21 NOTE — Plan of Care (Signed)
  Problem: RH Balance Goal: LTG: Patient will maintain dynamic sitting balance (OT) Description: LTG:  Patient will maintain dynamic sitting balance with assistance during activities of daily living (OT) Flowsheets (Taken 03/21/2022 1256) LTG: Pt will maintain dynamic sitting balance during ADLs with: Independent Goal: LTG Patient will maintain dynamic standing with ADLs (OT) Description: LTG:  Patient will maintain dynamic standing balance with assist during activities of daily living (OT)  Flowsheets (Taken 03/21/2022 1256) LTG: Pt will maintain dynamic standing balance during ADLs with: Supervision/Verbal cueing   Problem: Sit to Stand Goal: LTG:  Patient will perform sit to stand in prep for activites of daily living with assistance level (OT) Description: LTG:  Patient will perform sit to stand in prep for activites of daily living with assistance level (OT) Flowsheets (Taken 03/21/2022 1256) LTG: PT will perform sit to stand in prep for activites of daily living with assistance level: Supervision/Verbal cueing   Problem: RH Bathing Goal: LTG Patient will bathe all body parts with assist levels (OT) Description: LTG: Patient will bathe all body parts with assist levels (OT) Flowsheets (Taken 03/21/2022 1256) LTG: Pt will perform bathing with assistance level/cueing: Supervision/Verbal cueing   Problem: RH Dressing Goal: LTG Patient will perform upper body dressing (OT) Description: LTG Patient will perform upper body dressing with assist, with/without cues (OT). Flowsheets (Taken 03/21/2022 1256) LTG: Pt will perform upper body dressing with assistance level of: Independent Goal: LTG Patient will perform lower body dressing w/assist (OT) Description: LTG: Patient will perform lower body dressing with assist, with/without cues in positioning using equipment (OT) Flowsheets (Taken 03/21/2022 1256) LTG: Pt will perform lower body dressing with assistance level of: Supervision/Verbal  cueing   Problem: RH Toileting Goal: LTG Patient will perform toileting task (3/3 steps) with assistance level (OT) Description: LTG: Patient will perform toileting task (3/3 steps) with assistance level (OT)  Flowsheets (Taken 03/21/2022 1256) LTG: Pt will perform toileting task (3/3 steps) with assistance level: Supervision/Verbal cueing   Problem: RH Simple Meal Prep Goal: LTG Patient will perform simple meal prep w/assist (OT) Description: LTG: Patient will perform simple meal prep with assistance, with/without cues (OT). Flowsheets (Taken 03/21/2022 1256) LTG: Pt will perform simple meal prep with assistance level of: Supervision/Verbal cueing LTG: Pt will perform simple meal prep w/level of: Ambulate with device   Problem: RH Light Housekeeping Goal: LTG Patient will perform light housekeeping w/assist (OT) Description: LTG: Patient will perform light housekeeping with assistance, with/without cues (OT). Flowsheets (Taken 03/21/2022 1256) LTG: Pt will perform light housekeeping with assistance level of: Supervision/Verbal cueing LTG: Pt will perform light housekeeping w/level of: Ambulate with device   Problem: RH Toilet Transfers Goal: LTG Patient will perform toilet transfers w/assist (OT) Description: LTG: Patient will perform toilet transfers with assist, with/without cues using equipment (OT) Flowsheets (Taken 03/21/2022 1256) LTG: Pt will perform toilet transfers with assistance level of: Supervision/Verbal cueing   Problem: RH Tub/Shower Transfers Goal: LTG Patient will perform tub/shower transfers w/assist (OT) Description: LTG: Patient will perform tub/shower transfers with assist, with/without cues using equipment (OT) Flowsheets (Taken 03/21/2022 1256) LTG: Pt will perform tub/shower stall transfers with assistance level of: Supervision/Verbal cueing LTG: Pt will perform tub/shower transfers from: Tub/shower combination

## 2022-03-22 DIAGNOSIS — I6381 Other cerebral infarction due to occlusion or stenosis of small artery: Secondary | ICD-10-CM | POA: Diagnosis not present

## 2022-03-22 LAB — PATHOLOGIST SMEAR REVIEW

## 2022-03-22 MED ORDER — POTASSIUM CHLORIDE CRYS ER 20 MEQ PO TBCR
40.0000 meq | EXTENDED_RELEASE_TABLET | Freq: Once | ORAL | Status: AC
  Administered 2022-03-22: 40 meq via ORAL
  Filled 2022-03-22: qty 2

## 2022-03-22 MED ORDER — SENNOSIDES-DOCUSATE SODIUM 8.6-50 MG PO TABS
2.0000 | ORAL_TABLET | Freq: Two times a day (BID) | ORAL | Status: DC
  Administered 2022-03-22 – 2022-03-25 (×2): 2 via ORAL
  Filled 2022-03-22 (×5): qty 2

## 2022-03-22 NOTE — Progress Notes (Signed)
Occupational Therapy Session Note  Patient Details  Name: Sheri Pope MRN: [id number] Date of Birth: August 21, 1940  Today's Date: 03/22/2022 OT Individual Time: 6144-3154 OT Individual Time Calculation (min): 40 min    Short Term Goals: Week 1:  OT Short Term Goal 1 (Week 1): Pt will demonstrate improved balance to be able to pull pants over hips with CGA. OT Short Term Goal 2 (Week 1): Pt will don shoes with min A. OT Short Term Goal 3 (Week 1): Pt will be able to ambulate in kitchen with RW and retrieve items from shelves with CGA.  Skilled Therapeutic  Pt received in wheelchair . Agreeable to therapy.  Interventions/Progress Updates:     ADL Retraining: -ambulating to toilet with RW with min A, toileting with CGA. Pt able to manage clothing over hips without A -dry run practice into shower on tub bench CGA -pt stated she does have a tub bench at home but has never set it up. Her niece arrived at end of session and stated she would help set the bench up.  Therapeutic Activity/ Exercise/ NMR for RUE: While seated in recliner AROM for BUE with arm circles, alternating arm punches, "climbing a ladder", eccentric lowering of arms  Transfers: -close S sit to stands -CGA to min with stand pivots with RW  Balance: Held balance with close S to light CGA as she managed clothing over.      Pt resting in recliner . Alarm on and all needs met.   Therapy Documentation Precautions:  Precautions Precautions: Fall Precaution Comments: R hemi weakness, severe RA Restrictions Weight Bearing Restrictions: No  Pain:   ADL: ADL Eating: Set up Grooming: Setup Upper Body Bathing: Setup Lower Body Bathing: Minimal assistance Upper Body Dressing: Setup Lower Body Dressing: Moderate assistance Toileting: Moderate assistance Toilet Transfer: Minimal assistance Toilet Transfer Method: Stand pivot Toilet Transfer Equipment: Bedside commode  Therapy/Group: Individual  Therapy  Teshaun Olarte 03/22/2022, 8:46 AM

## 2022-03-22 NOTE — Progress Notes (Signed)
PROGRESS NOTE   Subjective/Complaints: Finishing up with PT, dressed, sitting at EOB Discussed low K+ and need for supplementation  Review of systems noted for chest pain shortness of breath nausea vomiting diarrhea   Objective:   No results found. Recent Labs    03/21/22 0757  WBC 4.8  HGB 13.6  HCT 39.7  PLT 265   Recent Labs    03/21/22 0757  NA 132*  K 3.4*  CL 99  CO2 26  GLUCOSE 142*  BUN 12  CREATININE 0.72  CALCIUM 9.0    Intake/Output Summary (Last 24 hours) at 03/22/2022 0834 Last data filed at 03/21/2022 1345 Gross per 24 hour  Intake 240 ml  Output --  Net 240 ml         Physical Exam: Vital Signs Blood pressure (!) 153/68, pulse 95, temperature 97.9 F (36.6 C), resp. rate 14, height '5\' 1"'$  (1.549 m), weight 41.9 kg, SpO2 94 %.   General: No acute distress Mood and affect are appropriate Heart: Regular rate and rhythm no rubs murmurs or extra sounds Lungs: Clear to auscultation, breathing unlabored, no rales or wheezes Abdomen: Positive bowel sounds, soft nontender to palpation, nondistended Extremities: No clubbing, cyanosis, or edema Skin: No evidence of breakdown, no evidence of rash Neurologic: Cranial nerves II through XII intact, motor strength is /5 in bilateral deltoid, bicep, tricep, grip, hip flexor, knee extensors, ankle dorsiflexor and plantar flexor Sensory exam normal sensation to light touch and proprioception in bilateral upper and lower extremities Cerebellar exam normal finger to nose to finger as well as heel to shin in bilateral upper and lower extremities Musculoskeletal: Rheumatoid joint deformities in both hands with ulnar deviation at the MCPs and PIPs.  Feet show no significant joint deformities.  Knees have full range of motion.  Healed incision from prior TKR on the left side. Shoulder range of motion is within functional limits.  Elbow range of motion within  functional limits Rheumatoid nodules on bilateral Achilles tendons.  Assessment/Plan: 1. Functional deficits which require 3+ hours per day of interdisciplinary therapy in a comprehensive inpatient rehab setting. Physiatrist is providing close team supervision and 24 hour management of active medical problems listed below. Physiatrist and rehab team continue to assess barriers to discharge/monitor patient progress toward functional and medical goals  Care Tool:  Bathing    Body parts bathed by patient: Right arm, Left arm, Chest, Abdomen, Front perineal area, Buttocks, Right upper leg, Left upper leg, Face   Body parts bathed by helper: Right lower leg, Left lower leg     Bathing assist Assist Level: Minimal Assistance - Patient > 75%     Upper Body Dressing/Undressing Upper body dressing   What is the patient wearing?: Pull over shirt    Upper body assist Assist Level: Set up assist    Lower Body Dressing/Undressing Lower body dressing      What is the patient wearing?: Incontinence brief, Pants     Lower body assist Assist for lower body dressing: Minimal Assistance - Patient > 75%     Toileting Toileting    Toileting assist Assist for toileting: Moderate Assistance - Patient 50 - 74%  Transfers Chair/bed transfer  Transfers assist     Chair/bed transfer assist level: Minimal Assistance - Patient > 75%     Locomotion Ambulation   Ambulation assist      Assist level: Minimal Assistance - Patient > 75% Assistive device: Walker-rolling Max distance: 105 ft   Walk 10 feet activity   Assist     Assist level: Supervision/Verbal cueing Assistive device: Walker-rolling   Walk 50 feet activity   Assist    Assist level: Minimal Assistance - Patient > 75% Assistive device: Walker-rolling    Walk 150 feet activity   Assist Walk 150 feet activity did not occur: Safety/medical concerns         Walk 10 feet on uneven surface   activity   Assist Walk 10 feet on uneven surfaces activity did not occur: Safety/medical concerns         Wheelchair     Assist Is the patient using a wheelchair?: Yes Type of Wheelchair: Manual    Wheelchair assist level: Dependent - Patient 0% Max wheelchair distance: 300 ft    Wheelchair 50 feet with 2 turns activity    Assist        Assist Level: Dependent - Patient 0%   Wheelchair 150 feet activity     Assist      Assist Level: Dependent - Patient 0%   Blood pressure (!) 153/68, pulse 95, temperature 97.9 F (36.6 C), resp. rate 14, height '5\' 1"'$  (1.549 m), weight 41.9 kg, SpO2 94 %.  Medical Problem List and Plan: 1. 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 Very active despite jt disease             -patient may  shower             -ELOS/Goals: 14-17 days, mod I mobility/cognition and supervision with self-care  Team conference today please see physician documentation under team conference tab, met with team  to discuss problems,progress, and goals. Formulized individual treatment plan based on medical history, underlying problem and comorbidities.  2.  Antithrombotics: -DVT/anticoagulation:  Mechanical: Antiembolism stockings, thigh (TED hose) Bilateral lower extremities             -antiplatelet therapy: Aspirin 81 mg daily and Plavix 75 mg daily x three weeks then aspirin alone/ Initiated 03/14/2022  3. Pain Management: Hydrocodone as needed, Robaxin 500 mg twice daily 4. Mood/Behavior/Sleep: Xanax 0.25 mg nightly for sleep.  Provide emotional support             -antipsychotic agents: N/A 5. Neuropsych/cognition: This patient is capable of making decisions on her own behalf. 6. Skin/Wound Care: Routine skin checks 7. Fluids/Electrolytes/Nutrition: Routine in and outs with follow-up chemistries 8.  Hyperlipidemia.  Lipitor 9.  Severe/advanced rheumatoid arthritis with  multiple joint replacements.  Follow-up outpatient.             -pain seems relatively controlled 10.  History of tobacco use.  NicoDerm patch.  Provide counseling 11.  History of leukemia.  Follow-up outpatient Dr.Katragadda 12.  Chronic hyponatremia related to history of leukemia/SIADH.  Continue fluid restriction.             -f/u labs on admission 13.  Large thyroid gland.  Incidental finding of heterogeneous enlarged thyroid gland on CT angio head and neck.  Thyroid ultrasound consistent with goiter, no suspicious nodule seen.  Follow-up outpatient  TSH  mildly elevated but normal T4 f/u PCP  14.  Mild HypoK KCL 70mq x 1 - recheck BMET in 2d LOS: 2 [place] Zula Hovsepian 03/22/2022, 8:34 AM

## 2022-03-22 NOTE — Progress Notes (Signed)
Physical Therapy Session Note  Patient Details  Name: Sheri Pope MRN: [id number] Date of Birth: 1940-07-01  Today's Date: 03/22/2022 PT Individual Time: 6644-0347 PT Individual Time Calculation (min): 74 min   Short Term Goals: Week 1:  PT Short Term Goal 1 (Week 1): STG = LTG d/t ELOS  Skilled Therapeutic Interventions/Progress Updates:    Pt received sitting in recliner chair and agreeable to PT. Transferred to [facility] CGA and was wheeled to main therapy gym.   Gait training: ambulated roughly 25 ft CGA w/ RW. Demonstrated reduced step height R> L VC to lift knee more to chest.   Dynamic Gait: 34f x3 w/ RW MinA in order to help lift RW, negotiating over 4 weighted poles. VC to lift B LE higher to avoid hitting heels on poles. VC to bring front wheels to pole and step into walker, then lift RW and step over pole.   Stair training: Pt ascended and descended stairs x3 reps [facility]GA [facility] rep pt got to the top and turned to descend. Due to the pt only having one HR on the L, reps 2-3 pt descended backwards. VC to lift knee more towards chest aided in pt clearing the stair.   Pt returned to room and needed to pass a bowel movement. Ambulated w/ RW and CGA into bathroom. Pt continent, Min A w/ pericare. Performed  hand hygiene at the sink CGA. Pt returned to bed CGA. Supervision w/ bed mobility sit--> supine.   Pt left in supine w/ bed alarm on, call bell in reach and all needs met.   Pain: denies any new pain     Therapy/Group: Individual Therapy  Dany Walther 03/22/2022, 6:07 PM

## 2022-03-22 NOTE — Plan of Care (Signed)
  Problem: Consults Goal: RH STROKE PATIENT EDUCATION Description: See Patient Education module for education specifics  Outcome: Progressing   Problem: RH SAFETY Goal: RH STG ADHERE TO SAFETY PRECAUTIONS W/ASSISTANCE/DEVICE Description: STG Adhere to Safety Precautions With cues Assistance/Device. Outcome: Progressing   Problem: RH PAIN MANAGEMENT Goal: RH STG PAIN MANAGED AT OR BELOW PT'S PAIN GOAL Description: < 4 with prns Outcome: Progressing   Problem: RH KNOWLEDGE DEFICIT Goal: RH STG INCREASE KNOWLEGDE OF HYPERLIPIDEMIA Description: Patient and family will be able to manage HLD with medications and dietary modifications using educational resources independently Outcome: Progressing Goal: RH STG INCREASE KNOWLEDGE OF STROKE PROPHYLAXIS Description: Patient and family will be able to manage secondary stroke risks with medications and dietary modifications using educational resources independently Outcome: Progressing

## 2022-03-22 NOTE — Progress Notes (Signed)
Occupational Therapy Session Note  Patient Details  Name: Sheri Pope MRN: [id number] Date of Birth: 1940/09/10  Today's Date: 03/22/2022 OT Individual Time: 1305-1350 OT Individual Time Calculation (min): 45 min    Short Term Goals: Week 1:  OT Short Term Goal 1 (Week 1): Pt will demonstrate improved balance to be able to pull pants over hips with CGA. OT Short Term Goal 2 (Week 1): Pt will don shoes with min A. OT Short Term Goal 3 (Week 1): Pt will be able to ambulate in kitchen with RW and retrieve items from shelves with CGA.  Skilled Therapeutic Interventions/Progress Updates:  Pt greeted seated in recliner, pt agreeable to OT intervention. Session focus on BADL reeducation, functional mobility, dynamic standing balance, RUE therex and decreasing overall caregiver burden.              Pt completed 2 ambulatory toilet transfers with RW and CGA during session with pt completing 2 continent urine voids.  Pt needed [place] for 3/3 toileting tasks needing assist for clothing mgmt on R side.   Pt completed ambulatory tub shower transfer to TTB with Rw with CGA. Pt able to elevate BLEs over threshold with supervision. Pt reports removable shower head at home with grab bars, education provided on tucking shower curtain under buttock to decrease draining water.   Issued pt compliant cube for RUE to work on AROM in  digits, pt has digit deformities at baseline. Pt discussed various compensatory methods that she uses for ADLs such as using needle pliers to apply eye drops as well as using built up handles for self feeding, pt did admit that she mostly eats finger foods d/t digit deformities.   Pt did complete seated RUE therex to facilitate improved RUE strength/endurance with loop tied in end of theraband d/t impaired grasp,  X10 elbow flexion/extension  X10 scapular protraction/retraction   Ended session with pt seated in recliner with all needs within reach and safety belt alarm activated.                     Therapy Documentation Precautions:  Precautions Precautions: Fall Precaution Comments: R hemi weakness, severe RA Restrictions Weight Bearing Restrictions: No  Pain:unrated pain reported in R shoulder, decreased AROM d/t pain     Therapy/Group: Individual Therapy  Precious Haws 03/22/2022, 3:35 PM

## 2022-03-22 NOTE — Progress Notes (Signed)
  Patient ID: Sheri Pope, female   DOB: 07/16/1940, 82 y.o.   MRN: [id number]  Team Conference Report to Patient/Family  Team Conference discussion was reviewed with the patient and caregiver, including goals, any changes in plan of care and target discharge date.  Patient and caregiver express understanding and are in agreement.  The patient has a target discharge date of 03/31/22.  SW met with patient and niece at bedside to provide team conference updates. Niece reports that patient will have supervision in the home at discharge. Patient and niece aware that patient is being assessed for diet advancement. No additional questions or concerns.   [name] 03/22/2022, 2:06 PM

## 2022-03-22 NOTE — Plan of Care (Signed)
  Problem: RH Balance Goal: LTG Patient will maintain dynamic standing balance (PT) Description: LTG:  Patient will maintain dynamic standing balance with assistance during mobility activities (PT) Flowsheets (Taken 03/21/2022 1737) LTG: Pt will maintain dynamic standing balance during mobility activities with:: Supervision/Verbal cueing   Problem: Sit to Stand Goal: LTG:  Patient will perform sit to stand with assistance level (PT) Description: LTG:  Patient will perform sit to stand with assistance level (PT) Flowsheets (Taken 03/21/2022 1737) LTG: PT will perform sit to stand in preparation for functional mobility with assistance level: Independent with assistive device   Problem: RH Bed Mobility Goal: LTG Patient will perform bed mobility with assist (PT) Description: LTG: Patient will perform bed mobility with assistance, with/without cues (PT). Flowsheets (Taken 03/21/2022 1737) LTG: Pt will perform bed mobility with assistance level of: Independent with assistive device    Problem: RH Bed to Chair Transfers Goal: LTG Patient will perform bed/chair transfers w/assist (PT) Description: LTG: Patient will perform bed to chair transfers with assistance (PT). Flowsheets (Taken 03/21/2022 1737) LTG: Pt will perform Bed to Chair Transfers with assistance level: Supervision/Verbal cueing   Problem: RH Car Transfers Goal: LTG Patient will perform car transfers with assist (PT) Description: LTG: Patient will perform car transfers with assistance (PT). Flowsheets (Taken 03/21/2022 1737) LTG: Pt will perform car transfers with assist:: Supervision/Verbal cueing   Problem: RH Furniture Transfers Goal: LTG Patient will perform furniture transfers w/assist (OT/PT) Description: LTG: Patient will perform furniture transfers  with assistance (OT/PT). Flowsheets (Taken 03/21/2022 1737) LTG: Pt will perform furniture transfers with assist:: Supervision/Verbal cueing   Problem: RH Ambulation Goal:  LTG Patient will ambulate in home environment (PT) Description: LTG: Patient will ambulate in home environment, # of feet with assistance (PT). Flowsheets (Taken 03/21/2022 1737) LTG: Pt will ambulate in home environ  assist needed:: Supervision/Verbal cueing LTG: Ambulation distance in home environment: at least 50 ft with use of LRAD Goal: LTG Patient will ambulate in community environment (PT) Description: LTG: Patient will ambulate in community environment, # of feet with assistance (PT). Flowsheets (Taken 03/21/2022 1737) LTG: Pt will ambulate in community environ  assist needed:: Contact Guard/Touching assist LTG: Ambulation distance in community environment: more than 200 ft using LRAD   Problem: RH Stairs Goal: LTG Patient will ambulate up and down stairs w/assist (PT) Description: LTG: Patient will ambulate up and down # of stairs with assistance (PT) Flowsheets (Taken 03/21/2022 1737) LTG: Pt will ambulate up/down stairs assist needed:: Supervision/Verbal cueing LTG: Pt will  ambulate up and down number of stairs: at least 2 steps using R or L HR as per home environment

## 2022-03-22 NOTE — Progress Notes (Signed)
Physical Therapy Session Note  Patient Details  Name: Sheri Pope MRN: [id number] Date of Birth: May 22, 1940  Today's Date: 03/22/2022 PT Individual Time: 0808-0905 PT Individual Time Calculation (min): 57 min   Short Term Goals: Week 1:  PT Short Term Goal 1 (Week 1): STG = LTG d/t ELOS  Skilled Therapeutic Interventions/Progress Updates:  Patient supine in bed  on entrance to room. Patient alert and agreeable to PT session. RN present and providing morning medications.   Patient with no pain complaint at start of session.  Therapeutic Activity: Bed Mobility: Pt performed supine <> sit with ***. VC/ tc required for ***. Transfers: Pt performed sit<>stand and stand pivot transfers throughout session with ***. Provided verbal cues for***.  Gait Training:  Pt ambulated *** ft using *** with ***. Demonstrated ***. Provided vc/ tc for ***.  Patient seated upright in w/c at end of session with brakes locked, belt alarm set, and all needs within reach. NT informed as to pt's disposition.   - bed mobility with light CGA.  - dressing with ModA - toileting with CGA for transfer with safety rail from w/c - MaxA for pericare - amb 65' x2 using RW - assisted pt with calling to order next 3 meals  Therapy Documentation Precautions:  Precautions Precautions: Fall Precaution Comments: R hemi weakness, severe RA Restrictions Weight Bearing Restrictions: No General:   Vital Signs:  Pain:  Generalized pain/ stiffness from RA and decreased mobility.   Therapy/Group: Individual Therapy  Alger Simons PT, DPT, CSRS 03/22/2022, 10:13 AM

## 2022-03-22 NOTE — Patient Care Conference (Signed)
Inpatient RehabilitationTeam Conference and Plan of Care Update Date: 03/22/2022   Time: 10:20 AM    Patient Name: Sheri Pope      Medical Record Number: [id number]  Date of Birth: 13-Apr-1940 Sex: Female         Room/Bed: 4W02C/4W02C-01 Payor Info: Payor: [employer] MEDICARE / Plan: [place] HMO / Product Type: *No Product type* /    Admit Date/Time:  03/20/2022  1:16 PM  Primary Diagnosis:  Left thalamic infarction [facility])  Hospital Problems: Principal Problem:   Left thalamic infarction [facility])    Expected Discharge Date: Expected Discharge Date: 03/31/22  Team Members Present: Physician leading conference: Dr. Alysia Penna Social Worker Present: Erlene Quan, BSW Nurse Present: Dorien Chihuahua, RN PT Present: Barrie Folk, PT OT Present: Meriel Pica, OT SLP Present: Sherren Kerns, SLP PPS Coordinator present : Gunnar Fusi, SLP     Current Status/Progress Goal Weekly Team Focus  Bowel/Bladder   bowel and bladder assistance to bathroom   continent bowel and bladder   toilet training q2hr and prn    Swallow/Nutrition/ Hydration   dys 3 diet and thin liquids   mod I  Advance diet as tolerated/appropriate    ADL's   min -mod A LB self care, min A transfers, set up UB self care   supervision overall   ADL retraining, functional mobility, balance, pt education    Mobility   severe RA, is trying to help nephew "caregiver" (who is an alcoholic) move out depsite potential for increased assist. Bed mobility = supervision/ CGA, transfers = MinA/ CGA, ambulation = limited distance prior to fatigue but will push self reached 105 ft with pediatric height RW and CGA/ MinA.   overall Mod I/ supervision  R NMR, increasing activity tolerance, improving LOA with transfers and ambulation, standing dynamic balance, family educ    Communication   not addressing            Safety/Cognition/ Behavioral Observations  not addressing            Pain   pain  is 6/10 generalized   pain level decreased or tolerance accepted   pain assessments q shift and prn to decrease pain level    Skin   skin intact   maintain skin integrity  assess skin every shift and prn      Discharge Planning:  Patient anticipates discharging back home at supervision level. Nephew will not assist much especially with ADL but is in the home. Niece will be able to check on patient daily. Patient has ramped entrance to enter home   Team Discussion: Patient with right side weakness and balance issues post left thalamic CVA. Hx of esophageal issues and RA.  Patient on target to meet rehab goals: yes, patient needs min assist for sit - stand, lower body care but only set up for upper body care. Goals for discharge set for supervision - mod I assist overall.  *See Care Plan and progress notes for long and short-term goals.   Revisions to Treatment Plan:  N/a  Teaching Needs: Safety, transfers, toileting, medications, etc  Current Barriers to Discharge: Decreased caregiver support  Possible Resolutions to Barriers: Family education     Medical Summary Current Status: hx RA with multiple hand deformities ,mild deficits from new stroke  Barriers to Discharge: Other (comments)  Barriers to Discharge Comments: chronic joint defornmities limiting hand use in BUEs Possible Resolutions to Barriers/Weekly Focus: manage BP, caregiver training   Continued Need for Acute  Rehabilitation Level of Care: The patient requires daily medical management by a physician with specialized training in physical medicine and rehabilitation for the following reasons: Direction of a multidisciplinary physical rehabilitation program to maximize functional independence : Yes Medical management of patient stability for increased activity during participation in an intensive rehabilitation regime.: Yes Analysis of laboratory values and/or radiology reports with any subsequent need for  medication adjustment and/or medical intervention. : Yes   I attest that I was present, lead the team conference, and concur with the assessment and plan of the team.   Dorien Chihuahua B 03/22/2022, 2:33 PM

## 2022-03-23 DIAGNOSIS — I6381 Other cerebral infarction due to occlusion or stenosis of small artery: Secondary | ICD-10-CM | POA: Diagnosis not present

## 2022-03-23 NOTE — Progress Notes (Signed)
Speech Language Pathology Discharge Summary  Patient Details  Name: Sheri Pope MRN: [id number] Date of Birth: 21-Jul-1940  Date of Discharge from SLP service:March 23, 2022  Today's Date: 03/23/2022 SLP Individual Time: 1400-1445 SLP Individual Time Calculation (min): 45 min  Skilled Therapeutic Interventions:  Skilled ST treatment focused on swallowing goals. Pt was greeted in recliner on arrival and stated she has made significant progress already. Pt reports tolerance of dysphagia 3 textures and has been consuming regular texture snacks without any issues. Pt agreeable to therapeutic PO trials with regular textures with functional and timely mastication, complete oral clearance, and without overt s/sx of aspiration nor c/o pharyngeal/esophageal retention. Pt also consumed thin liquids by straw without overt s/sx of aspiration and clear vocal quality post swallow. Pt implemented swallowing safety precautions/strategies with mod I to take small bites/sips and alternate between bites/sips. Pt appears appropriate to advance to regular textures at this time however requested for meats to come chopped for ease of meal prep d/t RA. SLP indicated to chop meats in pt's diet order, as well as requested for rehab staff to assist with set-up. SLP also provided general education regarding vocal hygiene d/t presence of vocal hoarseness. Pt continued to report she's not concerned at this time. Educated pt to bring to PCP's attention at discharge if vocal quality does not improve and may be appropriate to have further assessed by ENT as needed. Pt verbalized understanding of education. Patient was left in recliner with alarm activated and immediate needs within reach at end of session. Pt has no further speech therapy needs at this time. Plan to sign off.  Patient has met 1 of 1 long term goals.  Patient to discharge at overall Modified Independent level.  Reasons goals not met: NA   Clinical  Impression/Discharge Summary: Pt has demonstrated functional progress towards swallowing goal as evidenced tolerance of dysphagia 3 textures and progression to regular textures and thin liquids. Pt successfully met 1 out of 1 long-term goals this admission. Pt is tolerating regular textures and thin liquids with mod I for slow rate, alternating between bites/sips to aid esophageal clearance, and remaining upright following meals. Pt prefers for meats to come pre-chopped due to RA. Have requested rehab staff to assist with set-up. Education completed. Pt reported she will have necessary support from her niece at discharge. Pt has no further speech therapy needs at this time. Plan to sign off. No follow services are indicated.  Recommendation:  None     Equipment: None   Reasons for discharge: Treatment goals met   Patient/Family Agrees with Progress Made and Goals Achieved: Yes    Winry Egnew T Samel Bruna 03/23/2022, 4:19 PM

## 2022-03-23 NOTE — Progress Notes (Signed)
Occupational Therapy Session Note  Patient Details  Name: Sheri Pope MRN: [id number] Date of Birth: 05/05/1940  Today's Date: 03/23/2022 OT Individual Time: 1121-1209 OT Individual Time Calculation (min): 48 min    Short Term Goals: Week 1:  OT Short Term Goal 1 (Week 1): Pt will demonstrate improved balance to be able to pull pants over hips with CGA. OT Short Term Goal 2 (Week 1): Pt will don shoes with min A. OT Short Term Goal 3 (Week 1): Pt will be able to ambulate in kitchen with RW and retrieve items from shelves with CGA.  Skilled Therapeutic Interventions/Progress Updates:  Pt greeted seated in recliner, pt agreeable to OT intervention. Session focus on BADL reeducation, functional mobility, dynamic standing balance, standing tolerance and decreasing overall caregiver burden.           Pt completed 2 toilet transfer with RW and supervision, pt completed 3/3 toileting tasks with supervision, pt with 2 + urine voids during session.             Transported pt to gym with total A for time mgmt and energy conservation. Pt completed functional mobility obstacle course in gym with pt stepping over cones and walking across uneven surfaces to simulate community level mobility/navigating obstacles at home. Pt completed obstacle course with RW and close supervision, no LOB noted.   Pt completed Standing tolerance task with pt engaging in matching game task to further assess BUE [place], pt uses LUE more than RUE and has her own [facility] strategies d/t bilateral hand deformities. Pt able to stand for 3 mins with no UE support or LOB.   Education provided  on energy conservation strategies for ADLs with pt verbalizing understanding of education.   Ended session with pt seated in recliner with all needs within reach and safety belt alarm activated.                   Therapy Documentation Precautions:  Precautions Precautions: Fall Precaution Comments: R hemi weakness, severe  RA Restrictions Weight Bearing Restrictions: No   Pain: No pain    Therapy/Group: Individual Therapy  Precious Haws 03/23/2022, 12:41 PM

## 2022-03-23 NOTE — Progress Notes (Signed)
Occupational Therapy Session Note  Patient Details  Name: Sheri Pope MRN: [id number] Date of Birth: 04-Feb-1941  Today's Date: 03/23/2022 OT Individual Time: 0905-1005 OT Individual Time Calculation (min): 60 min    Short Term Goals: Week 1:  OT Short Term Goal 1 (Week 1): Pt will demonstrate improved balance to be able to pull pants over hips with CGA. OT Short Term Goal 2 (Week 1): Pt will don shoes with min A. OT Short Term Goal 3 (Week 1): Pt will be able to ambulate in kitchen with RW and retrieve items from shelves with CGA.  Skilled Therapeutic Interventions/Progress Updates:    Pt received in bed and agreeable to a shower. Pt did extremely well today with all of her mobility and ability to complete toileting, shower, dressing, standing at sink for oral care with close S to light CGA when standing.  She does need cues for foot placement as she tends to stand with feet too far in front which can move her into a posterior lean.  Pt expressed a desire to go home much sooner than her discharge date which is possible as she is progressing quickly.   Pt resting in recliner with all needs met and alarm set.   Therapy Documentation Precautions:  Precautions Precautions: Fall Precaution Comments: R hemi weakness, severe RA Restrictions Weight Bearing Restrictions: No  Pain: no c/o pain    ADL: ADL Eating: Set up Grooming: Setup Upper Body Bathing: Setup Where Assessed-Upper Body Bathing: Shower Lower Body Bathing: Contact guard Where Assessed-Lower Body Bathing: Shower Upper Body Dressing: Setup Where Assessed-Upper Body Dressing: Edge of bed Lower Body Dressing: Supervision/safety Where Assessed-Lower Body Dressing: Edge of bed Toileting: Contact guard Where Assessed-Toileting: Glass blower/designer: Therapist, music Method: Counselling psychologist: Raised toilet Tree surgeon: Walk in Facilities manager Transfer: Agricultural engineer Method: Heritage manager: Radio broadcast assistant, Grab bars   Therapy/Group: Individual Therapy  [name] 03/23/2022, 12:37 PM

## 2022-03-23 NOTE — Progress Notes (Signed)
PROGRESS NOTE   Subjective/Complaints:  No issues overnite   Review of systems noted for chest pain shortness of breath nausea vomiting diarrhea   Objective:   No results found. Recent Labs    03/21/22 0757  WBC 4.8  HGB 13.6  HCT 39.7  PLT 265    Recent Labs    03/21/22 0757  NA 132*  K 3.4*  CL 99  CO2 26  GLUCOSE 142*  BUN 12  CREATININE 0.72  CALCIUM 9.0     Intake/Output Summary (Last 24 hours) at 03/23/2022 0736 Last data filed at 03/23/2022 0729 Gross per 24 hour  Intake 1248 ml  Output 100 ml  Net 1148 ml         Physical Exam: Vital Signs Blood pressure (!) 142/65, pulse 79, temperature 98 F (36.7 C), resp. rate 15, height '5\' 1"'$  (1.549 m), weight 42.5 kg, SpO2 100 %.   General: No acute distress Mood and affect are appropriate Heart: Regular rate and rhythm no rubs murmurs or extra sounds Lungs: Clear to auscultation, breathing unlabored, no rales or wheezes Abdomen: Positive bowel sounds, soft nontender to palpation, nondistended Extremities: No clubbing, cyanosis, or edema Skin: No evidence of breakdown, no evidence of rash Neurologic: Cranial nerves II through XII intact, motor strength is 4/5 in bilateral deltoid, bicep, tricep, grip, hip flexor, knee extensors, ankle dorsiflexor and plantar flexor Sensory exam normal sensation to light touch and proprioception in bilateral upper and lower extremities Cerebellar exam normal finger to nose to finger as well as heel to shin in bilateral upper and lower extremities Musculoskeletal: Rheumatoid joint deformities in both hands with ulnar deviation at the MCPs and PIPs.  Feet show no significant joint deformities.  Knees have full range of motion.  Healed incision from prior TKR on the left side. Shoulder range of motion is within functional limits.  Elbow range of motion within functional limits Rheumatoid nodules on bilateral Achilles  tendons as well as TA tendons   Assessment/Plan: 1. Functional deficits which require 3+ hours per day of interdisciplinary therapy in a comprehensive inpatient rehab setting. Physiatrist is providing close team supervision and 24 hour management of active medical problems listed below. Physiatrist and rehab team continue to assess barriers to discharge/monitor patient progress toward functional and medical goals  Care Tool:  Bathing    Body parts bathed by patient: Right arm, Left arm, Chest, Abdomen, Front perineal area, Buttocks, Right upper leg, Left upper leg, Face   Body parts bathed by helper: Right lower leg, Left lower leg     Bathing assist Assist Level: Minimal Assistance - Patient > 75%     Upper Body Dressing/Undressing Upper body dressing   What is the patient wearing?: Pull over shirt    Upper body assist Assist Level: Set up assist    Lower Body Dressing/Undressing Lower body dressing      What is the patient wearing?: Incontinence brief, Pants     Lower body assist Assist for lower body dressing: Minimal Assistance - Patient > 75%     Toileting Toileting    Toileting assist Assist for toileting: Minimal Assistance - Patient > 75%  Transfers Chair/bed transfer  Transfers assist     Chair/bed transfer assist level: Minimal Assistance - Patient > 75%     Locomotion Ambulation   Ambulation assist      Assist level: Minimal Assistance - Patient > 75% Assistive device: Walker-rolling Max distance: 105 ft   Walk 10 feet activity   Assist     Assist level: Supervision/Verbal cueing Assistive device: Walker-rolling   Walk 50 feet activity   Assist    Assist level: Minimal Assistance - Patient > 75% Assistive device: Walker-rolling    Walk 150 feet activity   Assist Walk 150 feet activity did not occur: Safety/medical concerns         Walk 10 feet on uneven surface  activity   Assist Walk 10 feet on uneven surfaces  activity did not occur: Safety/medical concerns         Wheelchair     Assist Is the patient using a wheelchair?: Yes Type of Wheelchair: Manual    Wheelchair assist level: Dependent - Patient 0% Max wheelchair distance: 300 ft    Wheelchair 50 feet with 2 turns activity    Assist        Assist Level: Dependent - Patient 0%   Wheelchair 150 feet activity     Assist      Assist Level: Dependent - Patient 0%   Blood pressure (!) 142/65, pulse 79, temperature 98 F (36.7 C), resp. rate 15, height '5\' 1"'$  (1.549 m), weight 42.5 kg, SpO2 100 %.  Medical Problem List and Plan: 1. 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 Very active despite jt disease             -patient may  shower             -ELOS/Goals: 03/31/22, mod I mobility/cognition and supervision with self-care    2.  Antithrombotics: -DVT/anticoagulation:  Mechanical: Antiembolism stockings, thigh (TED hose) Bilateral lower extremities             -antiplatelet therapy: Aspirin 81 mg daily and Plavix 75 mg daily x three weeks then aspirin alone/ Initiated 03/14/2022  3. Pain Management: Hydrocodone as needed, Robaxin 500 mg twice daily 4. Mood/Behavior/Sleep: Xanax 0.25 mg nightly for sleep.  Provide emotional support             -antipsychotic agents: N/A 5. Neuropsych/cognition: This patient is capable of making decisions on her own behalf. 6. Skin/Wound Care: Routine skin checks 7. Fluids/Electrolytes/Nutrition: Routine in and outs with follow-up chemistries 8.  Hyperlipidemia.  Lipitor 9.  Severe/advanced rheumatoid arthritis with multiple joint replacements.  Follow-up outpatient.             -pain seems relatively controlled 10.  History of tobacco use.  NicoDerm patch.  Provide counseling 11.  History of leukemia.  Follow-up outpatient Dr.Katragadda 12.  Chronic hyponatremia related to history of leukemia/SIADH.   Continue fluid restriction.             -f/u labs on admission 13.  Large thyroid gland.  Incidental finding of heterogeneous enlarged thyroid gland on CT angio head and neck.  Thyroid ultrasound consistent with goiter, no suspicious nodule seen.  Follow-up outpatient  TSH mildly elevated but normal T4 f/u PCP  14.  Mild HypoK KCL 64mq x 1 - recheck BMET in 1d LOS: 3 days A FACE TO FACE EVALUATION WAS PERFORMED  Sheri Pope Sheri Pope 03/23/2022, 7:36 AM

## 2022-03-23 NOTE — Progress Notes (Signed)
Physical Therapy Session Note  Patient Details  Name: Sheri Pope MRN: [id number] Date of Birth: 03-Dec-1940  Today's Date: 03/23/2022 PT Individual Time: 1600-1656 PT Individual Time Calculation (min): 56 min   Short Term Goals: Week 1:  PT Short Term Goal 1 (Week 1): STG = LTG d/t ELOS  Skilled Therapeutic Interventions/Progress Updates:    Pt in chair upon arrival - agreeable to PT session. Txfers: sit<>stand CGA with intermittent cues for safety. Bed<>chair CGA with rw, 1 mild LOB with turning - independent recovery. Repeated multiple times and variable surfaces throughout session. Gait: amb 170 ft with rw CGA - decreased stance time Rt and decreasing stride length with fatigue. Repeat shorter distances with rw, focus on wt shift, stance time, stride length - verbal+tactile cues as needed. Amb 15 ftX1, 20 ft x1 with no AD, HHA. NRE: standing wt shifts, targeted toe taps, cone toe taps - focus on wt shifts, controlled activations and balance. Pt returned to room after session, left in care of nursing to assist to bathroom per pt request. No complaints.   Therapy Documentation Precautions:  Precautions Precautions: Fall Precaution Comments: R hemi weakness, severe RA Restrictions Weight Bearing Restrictions: No  Pain:  Denies any significant pain during session.     Therapy/Group: Individual Therapy  Linard Millers 03/23/2022, 5:00 PM

## 2022-03-23 NOTE — Plan of Care (Signed)
  Problem: RH Swallowing Goal: LTG Patient will consume least restrictive diet using compensatory strategies with assistance (SLP) Description: LTG:  Patient will consume least restrictive diet using compensatory strategies with assistance (SLP) Outcome: Completed/Met   

## 2022-03-23 NOTE — IPOC Note (Signed)
Overall Plan of Care [facility]) Patient Details Name: Sheri Pope MRN: [id number] DOB: [birth year]  Admitting Diagnosis: Left thalamic infarction [facility])  Hospital Problems: Principal Problem:   Left thalamic infarction [facility])     Functional Problem List: Nursing Bladder, Bowel, Edema, Endurance, Medication Management, Motor, Pain, Safety, Skin Integrity  PT Balance, Endurance, Motor, Nutrition, Perception, Safety, Sensory  OT Balance, Motor, Endurance  SLP Nutrition  TR         Basic ADL's: OT Bathing, Dressing, Toileting     Advanced  ADL's: OT Simple Meal Preparation, Light Housekeeping     Transfers: PT Bed Mobility, Bed to Chair, Car, Manufacturing systems engineer, Metallurgist: PT Ambulation, Stairs     Additional Impairments: OT None  SLP Swallowing      TR      Anticipated Outcomes Item Anticipated Outcome  Self Feeding mod I  Swallowing  Mod I   Basic self-care  supervision  Toileting  supervision   Bathroom Transfers supervision  Bowel/Bladder  patient will remain continent of bowel and bladder with min assist  Transfers  Mod I/ supervision  Locomotion  supervision  Communication     Cognition     Pain  pain will be less than or equal to 4/10 with medication  Safety/Judgment  patient will  be free from falls/injury and making appropriate safety decisions with min assist   Therapy Plan: PT Intensity: Minimum of 1-2 x/day ,45 to 90 minutes PT Frequency: 5 out of 7 days PT Duration Estimated Length of Stay: 10-14 days OT Intensity: Minimum of 1-2 x/day, 45 to 90 minutes OT Frequency: 5 out of 7 days OT Duration/Estimated Length of Stay: 14-16 days SLP Intensity: Minumum of 1-2 x/day, 30 to 90 minutes SLP Frequency: 1 to 3 out of 7 days SLP Duration/Estimated Length of Stay: 7-10 days for SLP   Team Interventions: Nursing Interventions Patient/Family Education, Bladder Management, Bowel Management, Disease Management/Prevention, Pain  Management, Medication Management, Skin Care/Wound Management, Discharge Planning  PT interventions Ambulation/gait training, Community reintegration, DME/adaptive equipment instruction, Neuromuscular re-education, Psychosocial support, Stair training, UE/LE Strength taining/ROM, Wheelchair propulsion/positioning, UE/LE Coordination activities, Therapeutic Activities, Skin care/wound management, Pain management, Functional electrical stimulation, Discharge planning, Training and development officer, Cognitive remediation/compensation, Disease management/prevention, Functional mobility training, Patient/family education, Splinting/orthotics, Therapeutic Exercise, Visual/perceptual remediation/compensation  OT Interventions Balance/vestibular training, DME/adaptive equipment instruction, Discharge planning, Functional mobility training, Neuromuscular re-education, Patient/family education, Psychosocial support, Self Care/advanced ADL retraining, Therapeutic Exercise, Therapeutic Activities, UE/LE Strength taining/ROM, UE/LE Coordination activities  SLP Interventions Dysphagia/aspiration precaution training, Internal/external aids, Therapeutic Activities, Environmental controls, Cueing hierarchy, Functional tasks, Patient/family education  TR Interventions    SW/CM Interventions Discharge Planning, Psychosocial Support, Patient/Family Education, Disease Management/Prevention   Barriers to Discharge MD  Medical stability  Nursing Lack of/limited family support 1 level home with ramped entrance.  Community ambulator without assistive device.  She does not drive.  Independent with ADLs.  Her nephew and daughter providing support as needed.  PT Decreased caregiver support, Inaccessible home environment, Incontinence, Lack of/limited family support, Insurance for SNF coverage, Nutrition means    OT Decreased caregiver support pt states her only caregiver his her nephew and he is not always available  SLP      SW  Insurance for SNF coverage, Decreased caregiver support, Lack of/limited family support     Team Discharge Planning: Destination: PT-Home ,OT- Home , SLP-Home Projected Follow-up: PT-Outpatient PT, 24 hour supervision/assistance (Neuro Based OPPT), OT-  Home health OT, SLP-None Projected  Equipment Needs: PT-To be determined, OT- Tub/shower bench, SLP-None recommended by SLP Equipment Details: PT- , OT-  Patient/family involved in discharge planning: PT- Patient,  OT-Patient, SLP-Patient  MD ELOS: 12-14d Medical Rehab Prognosis:  Fair Assessment: The patient has been admitted for CIR therapies with the diagnosis of Left Thalamic infarct . The team will be addressing functional mobility, strength, stamina, balance, safety, adaptive techniques and equipment, self-care, bowel and bladder mgt, patient and caregiver education, severe joint deformities due to RA. Goals have been set at Supervision. Anticipated discharge destination is home with family assist .  See Team Conference Notes for weekly updates to the plan of care

## 2022-03-24 LAB — URINALYSIS, W/ REFLEX TO CULTURE (INFECTION SUSPECTED)
Bilirubin Urine: NEGATIVE
Glucose, UA: NEGATIVE mg/dL
Ketones, ur: NEGATIVE mg/dL
Nitrite: NEGATIVE
Protein, ur: NEGATIVE mg/dL
Specific Gravity, Urine: 1.01 (ref 1.005–1.030)
WBC, UA: >50 WBC/hpf (ref 0–5)
pH: 5 (ref 5.0–8.0)

## 2022-03-24 LAB — BASIC METABOLIC PANEL
Anion gap: 10 (ref 5–15)
BUN: 10 mg/dL (ref 8–23)
CO2: 23 mmol/L (ref 22–32)
Calcium: 9.2 mg/dL (ref 8.9–10.3)
Chloride: 98 mmol/L (ref 98–111)
Creatinine, Ser: 0.6 mg/dL (ref 0.44–1.00)
GFR, Estimated: >60 mL/min (ref >60)
Glucose, Bld: 96 mg/dL (ref 70–99)
Potassium: 3.9 mmol/L (ref 3.5–5.1)
Sodium: 131 mmol/L — ABNORMAL LOW (ref 135–145)

## 2022-03-24 NOTE — Progress Notes (Signed)
PROGRESS NOTE   Subjective/Complaints:  Bad night, freq urination, has burning with urination at end of stream   Review of systems noted for chest pain shortness of breath nausea vomiting diarrhea   Objective:   No results found. No results for input(s): "WBC", "HGB", "HCT", "PLT" in the last 72 hours.  Recent Labs    03/24/22 0602  NA 131*  K 3.9  CL 98  CO2 23  GLUCOSE 96  BUN 10  CREATININE 0.60  CALCIUM 9.2     Intake/Output Summary (Last 24 hours) at 03/24/2022 0846 Last data filed at 03/23/2022 2348 Gross per 24 hour  Intake 236 ml  Output 25 ml  Net 211 ml         Physical Exam: Vital Signs Blood pressure (!) 154/66, pulse 88, temperature 98 F (36.7 C), resp. rate 16, height '5\' 1"'$  (1.549 m), weight 43.7 kg, SpO2 92 %.   General: No acute distress Mood and affect are appropriate Heart: Regular rate and rhythm no rubs murmurs or extra sounds Lungs: Clear to auscultation, breathing unlabored, no rales or wheezes Abdomen: Positive bowel sounds, soft nontender to palpation, nondistended Extremities: No clubbing, cyanosis, or edema Skin: No evidence of breakdown, no evidence of rash Neurologic: Cranial nerves II through XII intact, motor strength is 4/5 in bilateral deltoid, bicep, tricep, grip, hip flexor, knee extensors, ankle dorsiflexor and plantar flexor Sensory exam normal sensation to light touch and proprioception in bilateral upper and lower extremities Cerebellar exam normal finger to nose to finger as well as heel to shin in bilateral upper and lower extremities Musculoskeletal: Rheumatoid joint deformities in both hands with ulnar deviation at the MCPs and PIPs.  Feet show no significant joint deformities.  Knees have full range of motion.  Healed incision from prior TKR on the left side. Shoulder range of motion is within functional limits.  Elbow range of motion within functional  limits Rheumatoid nodules on bilateral Achilles tendons as well as TA tendons   Assessment/Plan: 1. Functional deficits which require 3+ hours per day of interdisciplinary therapy in a comprehensive inpatient rehab setting. Physiatrist is providing close team supervision and 24 hour management of active medical problems listed below. Physiatrist and rehab team continue to assess barriers to discharge/monitor patient progress toward functional and medical goals  Care Tool:  Bathing    Body parts bathed by patient: Right arm, Left arm, Chest, Abdomen, Front perineal area, Buttocks, Right upper leg, Left upper leg, Face, Right lower leg, Left lower leg   Body parts bathed by helper: Right lower leg, Left lower leg     Bathing assist Assist Level: Contact Guard/Touching assist     Upper Body Dressing/Undressing Upper body dressing   What is the patient wearing?: Pull over shirt    Upper body assist Assist Level: Set up assist    Lower Body Dressing/Undressing Lower body dressing      What is the patient wearing?: Incontinence brief, Pants     Lower body assist Assist for lower body dressing: Supervision/Verbal cueing     Toileting Toileting    Toileting assist Assist for toileting: Contact Guard/Touching assist     Transfers Chair/bed  transfer  Transfers assist     Chair/bed transfer assist level: Contact Guard/Touching assist     Locomotion Ambulation   Ambulation assist      Assist level: Minimal Assistance - Patient > 75% Assistive device: Walker-rolling Max distance: 105 ft   Walk 10 feet activity   Assist     Assist level: Supervision/Verbal cueing Assistive device: Walker-rolling   Walk 50 feet activity   Assist    Assist level: Minimal Assistance - Patient > 75% Assistive device: Walker-rolling    Walk 150 feet activity   Assist Walk 150 feet activity did not occur: Safety/medical concerns         Walk 10 feet on uneven  surface  activity   Assist Walk 10 feet on uneven surfaces activity did not occur: Safety/medical concerns         Wheelchair     Assist Is the patient using a wheelchair?: Yes Type of Wheelchair: Manual    Wheelchair assist level: Dependent - Patient 0% Max wheelchair distance: 300 ft    Wheelchair 50 feet with 2 turns activity    Assist        Assist Level: Dependent - Patient 0%   Wheelchair 150 feet activity     Assist      Assist Level: Dependent - Patient 0%   Blood pressure (!) 154/66, pulse 88, temperature 98 F (36.7 C), resp. rate 16, height '5\' 1"'$  (1.549 m), weight 43.7 kg, SpO2 92 %.  Medical Problem List and Plan: 1. 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 Very active despite jt disease             -patient may  shower             -ELOS/Goals: 03/31/22, mod I mobility/cognition and supervision with self-care    2.  Antithrombotics: -DVT/anticoagulation:  Mechanical: Antiembolism stockings, thigh (TED hose) Bilateral lower extremities             -antiplatelet therapy: Aspirin 81 mg daily and Plavix 75 mg daily x three weeks then aspirin alone/ Initiated 03/14/2022  3. Pain Management: Hydrocodone as needed, Robaxin 500 mg twice daily 4. Mood/Behavior/Sleep: Xanax 0.25 mg nightly for sleep.  Provide emotional support             -antipsychotic agents: N/A 5. Neuropsych/cognition: This patient is capable of making decisions on her own behalf. 6. Skin/Wound Care: Routine skin checks 7. Fluids/Electrolytes/Nutrition: Routine in and outs with follow-up chemistries 8.  Hyperlipidemia.  Lipitor 9.  Severe/advanced rheumatoid arthritis with multiple joint replacements.  Follow-up outpatient.             -pain seems relatively controlled 10.  History of tobacco use.  NicoDerm patch.  Provide counseling 11.  History of leukemia.  Follow-up outpatient Dr.Katragadda 12.   Chronic hyponatremia related to history of leukemia/SIADH.  Continue fluid restriction.             -f/u labs on admission 13.  Large thyroid gland.  Incidental finding of heterogeneous enlarged thyroid gland on CT angio head and neck.  Thyroid ultrasound consistent with goiter, no suspicious nodule seen.  Follow-up outpatient  TSH mildly elevated but normal T4 f/u PCP  14.  Mild HypoK KCL 61mq x 1 - recheck K+ is normal     Latest Ref Rng & Units 03/24/2022    6:02 AM 03/21/2022  7:57 AM 03/17/2022    3:29 AM  BMP  Glucose 70 - 99 mg/dL 96  142    BUN 8 - 23 mg/dL 10  12    Creatinine 0.44 - 1.00 mg/dL 0.60  0.72    Sodium 135 - 145 mmol/L 131  132    Potassium 3.5 - 5.1 mmol/L 3.9  3.4  3.7   Chloride 98 - 111 mmol/L 98  99    CO2 22 - 32 mmol/L 23  26    Calcium 8.9 - 10.3 mg/dL 9.2  9.0     15. Dysuria and frequency had a UTI last year but no hx of recurrence, will check UA C and S currently afebrile ,  allergic to sulfa abx LOS: 4 days A FACE TO FACE EVALUATION WAS PERFORMED  Charlett Blake 03/24/2022, 8:46 AM

## 2022-03-24 NOTE — Progress Notes (Signed)
Occupational Therapy Session Note  Patient Details  Name: Sheri Pope MRN: [id number] Date of Birth: 04/18/1940  Today's Date: 03/24/2022 OT Individual Time: 0830-0930 OT Individual Time Calculation (min): 60 min    Short Term Goals: Week 1:  OT Short Term Goal 1 (Week 1): Pt will demonstrate improved balance to be able to pull pants over hips with CGA. OT Short Term Goal 2 (Week 1): Pt will don shoes with min A. OT Short Term Goal 3 (Week 1): Pt will be able to ambulate in kitchen with RW and retrieve items from shelves with CGA.  Skilled Therapeutic Interventions/Progress Updates:    Pt received in bed ready to get up and toilet. Pt stated she had a bad night last night needing to get up to the bathroom several times.  Pt stated she was tired but actually performed extremely well today.  Close S with getting out of bed, donning shoes, standing up, ambulating to bathroom with RW.  Pt toileted with close S and then ambulated to sink to brush teeth. Pt stated she wanted to shower so walked back to bathroom. CGA in shower when standing.  CGA as she ambulated back to EOB to dress as she was more fatigued. Dressed with supervision and decided she needed to toilet a second time.  Pt then ambulated to recliner. Set up with all needs met and alarm on.  Therapy Documentation Precautions:  Precautions Precautions: Fall Precaution Comments: R hemi weakness, severe RA Restrictions Weight Bearing Restrictions: No     Pain: Pain Assessment Pain Scale: 0-10 Pain Score: 0-No pain Pain Intervention(s): Medication (See eMAR) (scheduled) ADL: ADL Eating: Set up Grooming: Setup Upper Body Bathing: Setup Where Assessed-Upper Body Bathing: Shower Lower Body Bathing: Contact guard Where Assessed-Lower Body Bathing: Shower Upper Body Dressing: Setup Where Assessed-Upper Body Dressing: Edge of bed Lower Body Dressing: Supervision/safety Where Assessed-Lower Body Dressing: Edge of  bed Toileting: Supervision/safety Where Assessed-Toileting: Glass blower/designer: Close supervision Armed forces technical officer Method: Counselling psychologist: Raised toilet seat Tub/Shower Equipment: Walk in Facilities manager Transfer: Curator Method: Heritage manager: Radio broadcast assistant, Grab bars     Therapy/Group: Individual Therapy  [place] 03/24/2022, 10:56 AM

## 2022-03-24 NOTE — Progress Notes (Signed)
Physical Therapy Session Note  Patient Details  Name: Sheri Pope MRN: [id number] Date of Birth: Apr 27, 1940  Today's Date: 03/24/2022 PT Individual Time: 0945-1030 PT Individual Time Calculation (min): 45 min   Short Term Goals: Week 1:  PT Short Term Goal 1 (Week 1): STG = LTG d/t ELOS  Skilled Therapeutic Interventions/Progress Updates: Pt presents sitting in recliner and agreeable to therapy.  Pt transfers sit to stand w/ supervision although cues for hand placement especially w/ stand to sit for controlled dscent.  Pt amb x 90' w/ RW and CGA, verbal cues for posture and maintaining position w/in RW.  Pt performed 2 x 5 sit to stand transfers w/o UE support and min A w/ verbal cueing for forward lean and breathing technique.  Pt performed toe taps to 3 3/4" platform w/ HHA, verbal cues for speed and improved weight shift (especially to L side) to lift contralateral foot.  O2 stas at 96% on RA w/ noted SOB.  Pt amb x 120' w/ RW and CGA toward room before encouraging to sit 2/2 increased unsteadiness and SOB.  Pt amb x 15' w/o UE support and min A w/ verbal cues for foot clearance and step length to recliner.  Pt then states need to use BR.  Pt amb to BR w/ RW and CGA only for steadying for managing clothing.  Pt handed off to NT.     Therapy Documentation Precautions:  Precautions Precautions: Fall Precaution Comments: R hemi weakness, severe RA Restrictions Weight Bearing Restrictions: No General:   Vital Signs:  Pain:5/10 generalized "arthritis pain" Pain Assessment Pain Scale: 0-10 Pain Score: 0-No pain Pain Intervention(s): Medication (See eMAR) (scheduled)    Therapy/Group: Individual Therapy  Ladoris Gene 03/24/2022, 10:31 AM

## 2022-03-24 NOTE — Progress Notes (Signed)
Physical Therapy Session Note  Patient Details  Name: Sheri Pope MRN: [id number] Date of Birth: 03-12-1940  Today's Date: 03/24/2022 PT Individual Time: 1115-1210 PT Individual Time Calculation (min): 55 min   Short Term Goals: Week 1:  PT Short Term Goal 1 (Week 1): STG = LTG d/t ELOS  Skilled Therapeutic Interventions/Progress Updates:  Patient seated upright in recliner with BLE elevated on entrance to room. Patient alert and agreeable to PT session.   Patient with no pain complaint at start of session.  Therapeutic Activity: Transfers: Pt performed sit<>stand and stand pivot transfers throughout session with close supervision. Provided verbal cues for improving forward lean for decreased dependence on RW to stand. Toilet transfer at end of session with supervision/ CGA for balance and requires light MinA for donning pants. MinA for pericare.   Gait Training:  Pt ambulated 150 ft using RW with CGA/ supervision and close w/c follow for fatigue. Minimal cueing provided for increased conscious focus to LLE for improved foot clearance. Short distance ambulation with no AD in order to challenge balance and progress gait. Does not reach out for handholds along routes. Ambulation with CGA.   Neuromuscular Re-ed: Pt guided in continuous reciprocation of BLE only using NuStep L3 x 81mn with focus on RLE extension while maintaining steady knee tracking over mid ankle. Pt also maintains workload at 1.7 METs   NMR performed for improvements in motor control and coordination, balance, sequencing, judgement, and self confidence/ efficacy in performing all aspects of mobility at highest level of independence.   Patient seated upright in recliner with BLE elevated at end of session with brakes locked, belt alarm set, and all needs within reach.   Therapy Documentation Precautions:  Precautions Precautions: Fall Precaution Comments: R hemi weakness, severe RA Restrictions Weight Bearing  Restrictions: No General:   Vital Signs:  Pain: Pain Assessment Pain Scale: 0-10 Pain Score: 0-No pain Pain Intervention(s): Medication (See eMAR) (scheduled)  Therapy/Group: Individual Therapy  JAlger SimonsPT, DPT, CSRS 03/24/2022, 12:31 PM

## 2022-03-25 DIAGNOSIS — A499 Bacterial infection, unspecified: Secondary | ICD-10-CM

## 2022-03-25 DIAGNOSIS — N390 Urinary tract infection, site not specified: Secondary | ICD-10-CM

## 2022-03-25 DIAGNOSIS — E871 Hypo-osmolality and hyponatremia: Secondary | ICD-10-CM

## 2022-03-25 MED ORDER — CEPHALEXIN 250 MG PO CAPS
500.0000 mg | ORAL_CAPSULE | Freq: Two times a day (BID) | ORAL | Status: DC
  Administered 2022-03-25 – 2022-03-29 (×9): 500 mg via ORAL
  Filled 2022-03-25 (×9): qty 2

## 2022-03-25 NOTE — Discharge Summary (Signed)
Physician Discharge Summary  Patient ID: Sheri Pope MRN: [id number] DOB/AGE: 10/05/1940 82 y.o.  Admit date: 03/20/2022 Discharge date: 03/31/2022  Discharge Diagnoses:  Principal Problem:   Left thalamic infarction [facility]) Pain management Hyperlipidemia Severe advanced rheumatoid arthritis History of tobacco use History of leukemia Chronic hyponatremia Large thyroid gland  Discharged Condition: Stable  Significant Diagnostic Studies: US THYROID  Result Date: 03/15/2022 CLINICAL DATA:  82 year old female with enlarged thyroid on CT EXAM: THYROID ULTRASOUND TECHNIQUE: Ultrasound examination of the thyroid gland and adjacent soft tissues was performed. COMPARISON:  CT 03/13/2022 FINDINGS: Parenchymal Echotexture: Markedly heterogenous Isthmus: 1.0 cm Right lobe: 5.2 cm x 3.0 cm x 2.4 cm Left lobe: 5.8 cm x 2.9 cm x 2.4 cm _________________________________________________________ Estimated total number of nodules >/= 1 cm: 0 Number of spongiform nodules >/=  2 cm not described below (TR1): 0 Number of mixed cystic and solid nodules >/= 1.5 cm not described below (TR2): 0 _________________________________________________________ No discrete nodules are seen within the thyroid gland. No adenopathy IMPRESSION: Heterogeneous and borderline enlarged thyroid, compatible with medical thyroid disease/goiter Electronically Signed   By: Corrie Mckusick D.O.   On: 03/15/2022 15:38   ECHOCARDIOGRAM COMPLETE  Result Date: 03/14/2022    ECHOCARDIOGRAM REPORT   Patient Name:   Sheri Pope Date of Exam: 03/14/2022 Medical Rec #:  [id number]        Height:       61.0 in Accession #:    [id number]       Weight:       94.8 lb Date of Birth:  1940/10/17         BSA:          1.375 m Patient Age:    82 years         BP:           185/82 mmHg Patient Gender: F                HR:           77 bpm. Exam Location:  [facility] Procedure: 2D Echo, Cardiac Doppler and Color Doppler Indications:    Stroke  History:         Patient has no prior history of Echocardiogram examinations.                 Stroke; Risk Factors:Current Smoker.  Sonographer:    Wenda Low Referring Phys: [id number] OLADAPO ADEFESO  Sonographer Comments: Image acquisition challenging due to respiratory motion. IMPRESSIONS  1. Left ventricular ejection fraction, by estimation, is 50 to 55%. The left ventricle has low normal function. The left ventricle has no regional wall motion abnormalities. There is mild left ventricular hypertrophy. Left ventricular diastolic parameters are consistent with Grade I diastolic dysfunction (impaired relaxation).  2. Right ventricular systolic function is normal. The right ventricular size is normal. Tricuspid regurgitation signal is inadequate for assessing PA pressure.  3. The mitral valve is normal in structure. Trivial mitral valve regurgitation. No evidence of mitral stenosis.  4. The aortic valve is tricuspid. There is moderate calcification of the aortic valve. There is moderate thickening of the aortic valve. Aortic valve regurgitation is not visualized. No aortic stenosis is present.  5. The inferior vena cava is normal in size with greater than 50% respiratory variability, suggesting right atrial pressure of 3 mmHg. FINDINGS  Left Ventricle: Left ventricular ejection fraction, by estimation, is 50 to 55%. The left ventricle has low normal function. The left ventricle  has no regional wall motion abnormalities. The left ventricular internal cavity size was normal in size. There is mild left ventricular hypertrophy. Left ventricular diastolic parameters are consistent with Grade I diastolic dysfunction (impaired relaxation). Normal left ventricular filling pressure. Right Ventricle: The right ventricular size is normal. Right vetricular wall thickness was not well visualized. Right ventricular systolic function is normal. Tricuspid regurgitation signal is inadequate for assessing PA pressure. Left Atrium: Left atrial  size was normal in size. Right Atrium: Right atrial size was normal in size. Pericardium: There is no evidence of pericardial effusion. Mitral Valve: The mitral valve is normal in structure. There is mild thickening of the mitral valve leaflet(s). There is mild calcification of the mitral valve leaflet(s). Mild mitral annular calcification. Trivial mitral valve regurgitation. No evidence  of mitral valve stenosis. MV peak gradient, 3.1 mmHg. The mean mitral valve gradient is 1.0 mmHg. Tricuspid Valve: The tricuspid valve is normal in structure. Tricuspid valve regurgitation is trivial. No evidence of tricuspid stenosis. Aortic Valve: The aortic valve is tricuspid. There is moderate calcification of the aortic valve. There is moderate thickening of the aortic valve. There is moderate aortic valve annular calcification. Aortic valve regurgitation is not visualized. No aortic stenosis is present. Aortic valve mean gradient measures 2.0 mmHg. Aortic valve peak gradient measures 3.9 mmHg. Aortic valve area, by VTI measures 2.03 cm. Pulmonic Valve: The pulmonic valve was not well visualized. Pulmonic valve regurgitation is not visualized. No evidence of pulmonic stenosis. Aorta: The aortic root is normal in size and structure. Venous: The inferior vena cava is normal in size with greater than 50% respiratory variability, suggesting right atrial pressure of 3 mmHg. IAS/Shunts: No atrial level shunt detected by color flow Doppler.  LEFT VENTRICLE PLAX 2D LVIDd:         4.40 cm   Diastology LVIDs:         3.20 cm   LV e' medial:    6.85 cm/s LV PW:         1.10 cm   LV E/e' medial:  8.9 LV IVS:        1.10 cm   LV e' lateral:   6.53 cm/s LVOT diam:     1.90 cm   LV E/e' lateral: 9.3 LV SV:         47 LV SV Index:   34 LVOT Area:     2.84 cm  RIGHT VENTRICLE RV Basal diam:  3.35 cm RV Mid diam:    2.50 cm RV S prime:     12.50 cm/s TAPSE (M-mode): 2.2 cm LEFT ATRIUM             Index        RIGHT ATRIUM           Index LA  diam:        3.30 cm 2.40 cm/m   RA Area:     13.70 cm LA Vol (A2C):   46.2 ml 33.59 ml/m  RA Volume:   36.10 ml  26.25 ml/m LA Vol (A4C):   35.8 ml 26.03 ml/m LA Biplane Vol: 41.5 ml 30.18 ml/m  AORTIC VALVE                    PULMONIC VALVE AV Area (Vmax):    2.40 cm     PV Vmax:       0.90 m/s AV Area (Vmean):   1.99 cm     PV Peak grad:  3.3 mmHg AV Area (VTI):     2.03 cm AV Vmax:           99.20 cm/s AV Vmean:          67.700 cm/s AV VTI:            0.231 m AV Peak Grad:      3.9 mmHg AV Mean Grad:      2.0 mmHg LVOT Vmax:         84.10 cm/s LVOT Vmean:        47.500 cm/s LVOT VTI:          0.165 m LVOT/AV VTI ratio: 0.71  AORTA Ao Root diam: 2.80 cm MITRAL VALVE MV Area (PHT): 3.23 cm    SHUNTS MV Area VTI:   2.15 cm    Systemic VTI:  0.16 m MV Peak grad:  3.1 mmHg    Systemic Diam: 1.90 cm MV Mean grad:  1.0 mmHg MV Vmax:       0.88 m/s MV Vmean:      48.1 cm/s MV Decel Time: 235 msec MV E velocity: 60.70 cm/s MV A velocity: 76.80 cm/s MV E/A ratio:  0.79 Carlyle Dolly MD Electronically signed by Carlyle Dolly MD Signature Date/Time: 03/14/2022/1:32:39 PM    Final    MR BRAIN WO CONTRAST  Result Date: 03/14/2022 CLINICAL DATA:  Generalized increased weakness EXAM: MRI HEAD WITHOUT CONTRAST TECHNIQUE: Multiplanar, multiecho pulse sequences of the brain and surrounding structures were obtained without intravenous contrast. COMPARISON:  Head CT and CTA from yesterday. FINDINGS: Brain: Acute lacunar infarct at the left internal capsule/thalamus. Chronic small vessel ischemia in the cerebral white matter. Chronic lacunar infarcts in the left thalamus. No acute hemorrhage, hydrocephalus, or masslike finding. Preserved brain volume. Vascular: Preserved arterial flow voids. Patent dural sinuses on recent CTA. Skull and upper cervical spine: No focal marrow lesion. Sinuses/Orbits: No acute finding. IMPRESSION: 1. Acute lacunar infarct at the left thalamocapsular junction. 2. Chronic small vessel  ischemia including chronic lacunar infarcts at the left thalamus. Electronically Signed   By: Jorje Guild M.D.   On: 03/14/2022 07:58   CT ANGIO HEAD NECK W WO CM  Result Date: 03/13/2022 CLINICAL DATA:  Stroke/TIA EXAM: CT ANGIOGRAPHY HEAD AND NECK TECHNIQUE: Multidetector CT imaging of the head and neck was performed using the standard protocol during bolus administration of intravenous contrast. Multiplanar CT image reconstructions and MIPs were obtained to evaluate the vascular anatomy. Carotid stenosis measurements (when applicable) are obtained utilizing NASCET criteria, using the distal internal carotid diameter as the denominator. RADIATION DOSE REDUCTION: This exam was performed according to the departmental dose-optimization program which includes automated exposure control, adjustment of the mA and/or kV according to patient size and/or use of iterative reconstruction technique. CONTRAST:  28m OMNIPAQUE IOHEXOL 350 MG/ML SOLN COMPARISON:  None Available. FINDINGS: CTA NECK FINDINGS SKELETON: There is no bony spinal canal stenosis. No lytic or blastic lesion. OTHER NECK: Mildly enlarged and heterogeneous thyroid gland UPPER CHEST: Biapical paraseptal emphysema AORTIC ARCH: There is calcific atherosclerosis of the aortic arch. There is no aneurysm, dissection or hemodynamically significant stenosis of the visualized portion of the aorta. Conventional 3 vessel aortic branching pattern. The visualized proximal subclavian arteries are widely patent. RIGHT CAROTID SYSTEM: No dissection, occlusion or aneurysm. Mild atherosclerotic calcification at the carotid bifurcation without hemodynamically significant stenosis. LEFT CAROTID SYSTEM: No dissection, occlusion or aneurysm. Mild atherosclerotic calcification at the carotid bifurcation without hemodynamically significant stenosis. VERTEBRAL ARTERIES: Right dominant configuration.  Both origins are clearly patent. There is no dissection, occlusion or  flow-limiting stenosis to the skull base (V1-V3 segments). CTA HEAD FINDINGS POSTERIOR CIRCULATION: --Vertebral arteries: Normal V4 segments. --Inferior cerebellar arteries: Normal. --Basilar artery: Normal. --Superior cerebellar arteries: Normal. --Posterior cerebral arteries (PCA): Normal. ANTERIOR CIRCULATION: --Intracranial internal carotid arteries: Normal. --Anterior cerebral arteries (ACA): Normal. Both A1 segments are present. Patent anterior communicating artery (a-comm). --Middle cerebral arteries (MCA): Normal. VENOUS SINUSES: As permitted by contrast timing, patent. ANATOMIC VARIANTS: None Review of the MIP images confirms the above findings. IMPRESSION: 1. No emergent large vessel occlusion or hemodynamically significant stenosis of the head or neck. 2. Mild bilateral carotid bifurcation atherosclerosis without hemodynamically significant stenosis. 3. Mildly enlarged and heterogeneous thyroid gland. Incidental heterogeneous and enlarged thyroid. Recommend non-emergent thyroid ultrasound. Reference: J Am Coll Radiol. 2015 Feb;12(2): 143-50 Aortic Atherosclerosis (ICD10-I70.0) and Emphysema (ICD10-J43.9). Electronically Signed   By: Ulyses Jarred M.D.   On: 03/13/2022 23:50   CT HEAD WO CONTRAST  Result Date: 03/13/2022 CLINICAL DATA:  Generalized weakness for 3 days EXAM: CT HEAD WITHOUT CONTRAST TECHNIQUE: Contiguous axial images were obtained from the base of the skull through the vertex without intravenous contrast. RADIATION DOSE REDUCTION: This exam was performed according to the departmental dose-optimization program which includes automated exposure control, adjustment of the mA and/or kV according to patient size and/or use of iterative reconstruction technique. COMPARISON:  None Available. FINDINGS: Brain: No acute territorial infarction, hemorrhage or intracranial mass. Probable chronic lacunar infarct left thalamus. Mild chronic small vessel ischemic changes of the white matter.  Nonenlarged ventricles Vascular: No hyperdense vessels.  Carotid vascular calcification Skull: Normal. Negative for fracture or focal lesion. Sinuses/Orbits: No acute finding. Other: None IMPRESSION: 1. No CT evidence for acute intracranial abnormality. 2. Mild chronic small vessel ischemic changes of the white matter. Electronically Signed   By: Donavan Foil M.D.   On: 03/13/2022 21:29    Labs:  Basic Metabolic Panel: Recent Labs  Lab 03/21/22 0757 03/24/22 0602  NA 132* 131*  K 3.4* 3.9  CL 99 98  CO2 26 23  GLUCOSE 142* 96  BUN 12 10  CREATININE 0.72 0.60  CALCIUM 9.0 9.2    CBC: Recent Labs  Lab 03/21/22 0757  WBC 4.8  NEUTROABS 0.3*  HGB 13.6  HCT 39.7  MCV 99.3  PLT 265    CBG: No results for input(s): "GLUCAP" in the last 168 hours.  Family history.  Sister with diabetes and macular degeneration father with CVA and dementia.  Denies any colon cancer esophageal cancer or rectal cancer  Brief HPI:   Sheri Pope is a 82 y.o. right-handed female with history of hyperlipidemia severe advanced rheumatoid arthritis with multiple joint replacements maintained on hydrocodone 5/325 mg every 6 hours as needed, tobacco use as well as leukemia followed by Dr.Katragadda and refused current treatment.  Per chart review lives in a 1 level home with a ramped entrance.  She does not drive.  Nephew and daughter provide support as needed.  [facility]ed [facility] with acute onset of right-sided weakness and slurred speech.  CT/MRI showed acute lacunar infarct at the left thalamocapsular junction as well as chronic small vessel ischemia including chronic lacunar infarcts of the left thalamus.  Patient did not receive tPA.  CT angiogram head and neck no emergent large vessel occlusion.  Noted incidental findings of mildly enlarged and heterogeneous thyroid gland.  Ultrasound of the thyroid showed heterogeneous and borderline enlarged thyroid compatible  with medical  thyroid disease/goiter and no suspicious nodule seen.  Admission chemistries unremarkable urine drug screen positive opiates sodium 129.  Echocardiogram with ejection fraction of 50 to 55% no wall motion abnormalities grade 1 diastolic dysfunction.  Neurology follow-up maintained on low-dose aspirin and Plavix for CVA prophylaxis x 3 weeks then aspirin alone.  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  Therapy evaluations completed due to patient decreased functional ability right-sided weakness was admitted for a comprehensive rehab program.   Hospital Course: Sheri Pope was admitted to rehab 03/20/2022 for inpatient therapies to consist of PT, ST and OT at least three hours five days a week. Past admission physiatrist, therapy team and rehab RN have worked together to provide customized collaborative inpatient rehab.  Pertaining to patient's left thalamocapsular infarction remained stable aspirin and Plavix x 3 weeks then aspirin alone initiated 03/14/2022.  Pain manager use of hydrocodone and Robaxin.  Severe advanced rheumatoid arthritis follow-up outpatient.  History of tobacco use NicoDerm patch receiving counseling regarding cessation of nicotine products.  History of leukemia currently refusing any further treatment follow-up outpatient oncology services.  Chronic hyponatremia related to history of leukemia/SIADH continue fluid restriction.  Large thyroid gland incidental findings enlarged thyroid on CT angio head and neck.  Thyroid ultrasound consistent with goiter no suspicious nodules follow-up outpatient.   Blood pressures were monitored on TID basis and soft and monitored     Rehab course: During patient's stay in rehab weekly team conferences were held to monitor patient's progress, set goals and discuss barriers to discharge. At admission, patient required moderate assist 30 feet rolling walker minimal assist sit to  stand  Physical exam.  Blood pressure 133/60 pulse 81 temperature 97.9 respirations 16 oxygen saturation is 98% room air Constitutional.  No acute distress HEENT Head.  Normocephalic and atraumatic Eyes.  Pupils round and reactive to light no discharge without nystagmus Neck.  Supple nontender no JVD without thyromegaly Cardiac regular rate and rhythm without any extra sounds or murmur heard Abdomen.  Soft nontender positive bowel sounds without rebound Respiratory effort normal no respiratory distress without wheeze Musculoskeletal.  Severe rheumatoid changes to the hands feet with deformities most prominent in the hands. Neurologic.  Alert oriented x 3.  Right upper extremity 3+/5 proximal to distal.  Left upper extremity 4+/5.  Right lower extremity 4-4+/5.  Left lower extremity 4-4+/5 no sensory deficits  He/She  has had improvement in activity tolerance, balance, postural control as well as ability to compensate for deficits. He/She has had improvement in functional use RUE/LUE  and RLE/LLE as well as improvement in awareness.  Perform sit to stand and stand pivot transfers close supervision contact-guard.  Ambulates 150 feet x 2 rolling walker contact-guard.  Navigates a 5 inch curb step using rolling walker contact-guard ascending with left lower extremity first and descending with right lower extremity.  Gather his belongings for activities day living and homemaking.  Full family teaching completed plan discharge to home       Disposition: Discharge to home    Diet: Regular diet with 1500 mL fluid restriction  Special Instructions: No driving smoking or alcohol  Medications at discharge 1.  Tylenol as needed 2.  Xanax 0.25 mg p.o. nightly 3.  Aspirin 81 mg p.o. daily 4.  Lipitor 40 mg p.o. daily 5.  Plavix 75 mg p.o. daily until 04/03/2022 and stop 6.  Hydrocodone 0.5 mg p.o. twice daily as needed  7.  Robaxin 500 mg p.o. twice daily 8.  NicoDerm patch taper as  directed  30-35 minutes were spent completing discharge summary and discharge planning  Discharge Instructions     Ambulatory referral to Physical Medicine Rehab   Complete by: As directed    Moderate complexity follow-up 1 to 2 weeks left thalamic infarction         Signed: Lavon Paganini Speers 03/25/2022, 10:30 AM

## 2022-03-25 NOTE — Progress Notes (Signed)
Occupational Therapy Session Note  Patient Details  Name: Sheri Pope MRN: [id number] Date of Birth: 01-Apr-1940  Today's Date: 03/25/2022 OT Individual Time: 0930-1000 and 1400-1500 OT Individual Time Calculation (min): 30 min and 60 min    Short Term Goals: Week 1:  OT Short Term Goal 1 (Week 1): Pt will demonstrate improved balance to be able to pull pants over hips with CGA. OT Short Term Goal 2 (Week 1): Pt will don shoes with min A. OT Short Term Goal 3 (Week 1): Pt will be able to ambulate in kitchen with RW and retrieve items from shelves with CGA.  Skilled Therapeutic Interventions/Progress Updates:    Visit 1: no c/o pain  Pt received in bed and agreeable to therapy.  Pt worked on ambulation to bathroom and back to EOB to don [employer]. Pt stood at sink for oral and hair care. She said would probably like to shower this afternoon.  Pt ambulated to recliner and sat to rest.  Belt alarm on and all needs met.   Visit 2:   Pain: no c/o pain  Pt received in bed stating she was exhausted and still not feeling well from UTI symptoms.  Pt agreeable to working on UE strengthening. Worked on intervals of AROM exercises using large GM movement patterns.  Pt did want to try to toilet before end of session.  Sat to EOB, donned shoes, stood up all with Supervision. Used RW to ambulate to bathroom with S in a very smooth fluid manner.  Despite her fatigue, her mobility looked stronger and more efficient.  Pt returned to bed with all needs met, alarm set.    Therapy Documentation Precautions:  Precautions Precautions: Fall Precaution Comments: R hemi weakness, severe RA Restrictions Weight Bearing Restrictions: No    Vital Signs: Therapy Vitals Temp: 97.9 F (36.6 C) Pulse Rate: 81 Resp: 16 BP: 131/60 Patient Position (if appropriate): Lying Oxygen Therapy SpO2: 95 % O2 Device: Room Air    ADL: ADL Eating: Set up Grooming: Setup Upper Body Bathing: Setup Where  Assessed-Upper Body Bathing: Shower Lower Body Bathing: Contact guard Where Assessed-Lower Body Bathing: Shower Upper Body Dressing: Setup Where Assessed-Upper Body Dressing: Edge of bed Lower Body Dressing: Supervision/safety Where Assessed-Lower Body Dressing: Edge of bed Toileting: Supervision/safety Where Assessed-Toileting: Glass blower/designer: Close supervision Armed forces technical officer Method: Counselling psychologist: Raised toilet seat Tub/Shower Equipment: Walk in Facilities manager Transfer: Curator Method: Heritage manager: Radio broadcast assistant, Grab bars      Therapy/Group: Individual Therapy  Harris Kistler 03/25/2022, 7:59 AM

## 2022-03-25 NOTE — Plan of Care (Signed)
Pt alert and oriented x 4. Up with 1 assist and walker x  3 then pt was fatigued and needed bsc. Pt urinating approx 142m every time getting up. Pt compliant with meds except senna and refused. Pain at a tolerable level with scheduled norco.  Problem: Consults Goal: RH STROKE PATIENT EDUCATION Description: See Patient Education module for education specifics  Outcome: Progressing   Problem: RH SAFETY Goal: RH STG ADHERE TO SAFETY PRECAUTIONS W/ASSISTANCE/DEVICE Description: STG Adhere to Safety Precautions With cues Assistance/Device. Outcome: Progressing   Problem: RH PAIN MANAGEMENT Goal: RH STG PAIN MANAGED AT OR BELOW PT'S PAIN GOAL Description: < 4 with prns Outcome: Progressing   Problem: RH KNOWLEDGE DEFICIT Goal: RH STG INCREASE KNOWLEGDE OF HYPERLIPIDEMIA Description: Patient and family will be able to manage HLD with medications and dietary modifications using educational resources independently Outcome: Progressing Goal: RH STG INCREASE KNOWLEDGE OF STROKE PROPHYLAXIS Description: Patient and family will be able to manage secondary stroke risks with medications and dietary modifications using educational resources independently Outcome: Progressing

## 2022-03-25 NOTE — Progress Notes (Signed)
PROGRESS NOTE   Subjective/Complaints:  Still having urinary frequency. Was able to sleep a little better. Walked in therapy yesterday and very proud of that.  ROS: Patient denies fever, rash, sore throat, blurred vision, dizziness, nausea, vomiting, diarrhea, cough, shortness of breath or chest pain, joint or back/neck pain, headache, or mood change.    Objective:   No results found. No results for input(s): "WBC", "HGB", "HCT", "PLT" in the last 72 hours.  Recent Labs    03/24/22 0602  NA 131*  K 3.9  CL 98  CO2 23  GLUCOSE 96  BUN 10  CREATININE 0.60  CALCIUM 9.2    Intake/Output Summary (Last 24 hours) at 03/25/2022 1002 Last data filed at 03/24/2022 2330 Gross per 24 hour  Intake 502 ml  Output 100 ml  Net 402 ml        Physical Exam: Vital Signs Blood pressure 131/60, pulse 81, temperature 97.9 F (36.6 C), resp. rate 16, height '5\' 1"'$  (1.549 m), weight 42.8 kg, SpO2 95 %.   Constitutional: No distress . Vital signs reviewed. HEENT: NCAT, EOMI, oral membranes moist Neck: supple Cardiovascular: RRR without murmur. No JVD    Respiratory/Chest: CTA Bilaterally without wheezes or rales. Normal effort    GI/Abdomen: BS +, non-tender, non-distended Ext: no clubbing, cyanosis, or edema Psych: pleasant and cooperative  Skin: No evidence of breakdown, no evidence of rash Neurologic: Cranial nerves II through XII intact, motor strength is 4/5 in bilateral deltoid, bicep, tricep, grip, hip flexor, knee extensors, ankle dorsiflexor and plantar flexor Sensory exam normal sensation to light touch and proprioception in bilateral upper and lower extremities Cerebellar exam normal finger to nose to finger as well as heel to shin in bilateral upper and lower extremities Musculoskeletal: Rheumatoid joint deformities in both hands with ulnar deviation at the MCPs and PIPs.  Feet show no significant joint deformities.   Knees have full range of motion.  Healed incision from prior TKR on the left side. Shoulder range of motion is within functional limits.  Elbow range of motion within functional limits Rheumatoid nodules on bilateral Achilles tendons as well as TA tendons= no changes  Assessment/Plan: 1. Functional deficits which require 3+ hours per day of interdisciplinary therapy in a comprehensive inpatient rehab setting. Physiatrist is providing close team supervision and 24 hour management of active medical problems listed below. Physiatrist and rehab team continue to assess barriers to discharge/monitor patient progress toward functional and medical goals  Care Tool:  Bathing    Body parts bathed by patient: Right arm, Left arm, Chest, Abdomen, Front perineal area, Buttocks, Right upper leg, Left upper leg, Face, Right lower leg, Left lower leg   Body parts bathed by helper: Right lower leg, Left lower leg     Bathing assist Assist Level: Contact Guard/Touching assist     Upper Body Dressing/Undressing Upper body dressing   What is the patient wearing?: Pull over shirt    Upper body assist Assist Level: Set up assist    Lower Body Dressing/Undressing Lower body dressing      What is the patient wearing?: Incontinence brief, Pants     Lower body assist Assist for  lower body dressing: Supervision/Verbal cueing     Toileting Toileting    Toileting assist Assist for toileting: Contact Guard/Touching assist     Transfers Chair/bed transfer  Transfers assist     Chair/bed transfer assist level: Contact Guard/Touching assist     Locomotion Ambulation   Ambulation assist      Assist level: Contact Guard/Touching assist Assistive device: Walker-rolling Max distance: 120   Walk 10 feet activity   Assist     Assist level: Contact Guard/Touching assist Assistive device: Walker-rolling   Walk 50 feet activity   Assist    Assist level: Contact Guard/Touching  assist Assistive device: Walker-rolling    Walk 150 feet activity   Assist Walk 150 feet activity did not occur: Safety/medical concerns         Walk 10 feet on uneven surface  activity   Assist Walk 10 feet on uneven surfaces activity did not occur: Safety/medical concerns         Wheelchair     Assist Is the patient using a wheelchair?: Yes Type of Wheelchair: Manual    Wheelchair assist level: Dependent - Patient 0% Max wheelchair distance: 300 ft    Wheelchair 50 feet with 2 turns activity    Assist        Assist Level: Dependent - Patient 0%   Wheelchair 150 feet activity     Assist      Assist Level: Dependent - Patient 0%   Blood pressure 131/60, pulse 81, temperature 97.9 F (36.6 C), resp. rate 16, height '5\' 1"'$  (1.549 m), weight 42.8 kg, SpO2 95 %.  Medical Problem List and Plan: 1. 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 Very active despite jt disease             -patient may  shower             -ELOS/Goals: 03/31/22, mod I mobility/cognition and supervision with self-care   -Continue CIR therapies including PT, OT, and SLP   2.  Antithrombotics: -DVT/anticoagulation:  Mechanical: Antiembolism stockings, thigh (TED hose) Bilateral lower extremities             -antiplatelet therapy: Aspirin 81 mg daily and Plavix 75 mg daily x three weeks then aspirin alone/ Initiated 03/14/2022  3. Pain Management: Hydrocodone as needed, Robaxin 500 mg twice daily 4. Mood/Behavior/Sleep: Xanax 0.25 mg nightly for sleep.  Provide emotional support             -antipsychotic agents: N/A 5. Neuropsych/cognition: This patient is capable of making decisions on her own behalf. 6. Skin/Wound Care: Routine skin checks 7. Fluids/Electrolytes/Nutrition: Routine in and outs with follow-up chemistries 8.  Hyperlipidemia.  Lipitor 9.  Severe/advanced rheumatoid arthritis with multiple  joint replacements.  Follow-up outpatient.             -pain seems relatively controlled 10.  History of tobacco use.  NicoDerm patch.  Provide counseling 11.  History of leukemia.  Follow-up outpatient Dr.Katragadda 12.  Chronic hyponatremia related to history of leukemia/SIADH.  Continue fluid restriction.             -f/u labs on admission 13.  Large thyroid gland.  Incidental finding of heterogeneous enlarged thyroid gland on CT angio head and neck.  Thyroid ultrasound consistent with goiter, no suspicious nodule seen.  Follow-up outpatient  TSH mildly elevated but normal T4 f/u PCP  14.  Mild HypoK KCL 45mq x 1 - recheck K+ is normal     Latest Ref Rng & Units 03/24/2022    6:02 AM 03/21/2022    7:57 AM 03/17/2022    3:29 AM  BMP  Glucose 70 - 99 mg/dL 96  142    BUN 8 - 23 mg/dL 10  12    Creatinine 0.44 - 1.00 mg/dL 0.60  0.72    Sodium 135 - 145 mmol/L 131  132    Potassium 3.5 - 5.1 mmol/L 3.9  3.4  3.7   Chloride 98 - 111 mmol/L 98  99    CO2 22 - 32 mmol/L 23  26    Calcium 8.9 - 10.3 mg/dL 9.2  9.0     15. Dysuria and frequency- UA +, UCX pending -1/27 begin empiric keflex '500mg'$  bid   LOS: 5 days A FACE TO FACE EVALUATION WAS PERFORMED  ZMeredith Staggers1/27/2024, 10:02 AM

## 2022-03-25 NOTE — Progress Notes (Signed)
Physical Therapy Session Note  Patient Details  Name: Sheri Pope MRN: [id number] Date of Birth: 05-26-1940  Today's Date: 03/24/2022 PT Individual Time:  1512-1610  PT Individual Time Calculation (min): 58 min  Short Term Goals: Week 1:  PT Short Term Goal 1 (Week 1): STG = LTG d/t ELOS  Skilled Therapeutic Interventions/Progress Updates:  Patient seated upright in recliner with BLE elevated on entrance to room. Patient alert and agreeable to PT session.   Patient with no pain complaint at start of session.  Therapeutic Activity: Transfers: Pt performed sit<>stand and stand pivot transfers throughout session with close superivsion/ CGA. Provided verbal cues for hand positioning for improved LOA.  Gait Training:  Pt ambulated 150' x2 using RW with CGA. Demonstrated slow pace with visual decrease in quality of gait with decreased step height/ length on R as well as NBOS. Provided vc/ tc for correcting and taking rest break in order to provide rest for fatigued musculature and cardiorespiratory system.  Demos ability to navigate 5" curb step using RW with CGA ascending with LLE first and descending with RLE first. She requires minimal cueing and CGA for walker mgmt d/t RA progression and difficulty maintaining grip with RUE. Decreased cues required with 2nd bout.   Neuromuscular Re-ed: NMR facilitated during session with focus on increasing RLE muscle activation and motor control. Pt guided in RLE step up to 5" curb step using RW with return backwards. She completes 3x 5reps with minimal fatigue noted. Though increased resp rate upon returning to seated position.   NMR performed for improvements in motor control and coordination, balance, sequencing, judgement, and self confidence/ efficacy in performing all aspects of mobility at highest level of independence.   Toilet transfer on return to room with pt able to complete with CGA/ supervision and improved ability to doff and don pants  without physical assist in clothing mgmt.   Patient returned to seated position in recliner with BLE elevated at end of session with brakes locked, belt alarm set, and all needs within reach.   Therapy Documentation Precautions:  Precautions Precautions: Fall Precaution Comments: R hemi weakness, severe RA Restrictions Weight Bearing Restrictions: No General:   Vital Signs: Therapy Vitals Temp: 97.9 F (36.6 C) Pulse Rate: 81 Resp: 16 BP: 131/60 Patient Position (if appropriate): Lying Oxygen Therapy SpO2: 95 % O2 Device: Room Air Pain:  No complaint of pain this session.   Therapy/Group: Individual Therapy  Alger Simons PT, DPT, CSRS 03/24/2022, 5:53 PM

## 2022-03-25 NOTE — Discharge Instructions (Signed)
Inpatient Rehab Discharge Instructions  Sheri Pope Discharge date and time: No discharge date for patient encounter.   Activities/Precautions/ Functional Status: Activity: As tolerated Diet: Regular diet with 1500 mL fluid restriction Wound Care: Routine skin checks Functional status:  ___ No restrictions     ___ Walk up steps independently ___ 24/7 supervision/assistance   ___ Walk up steps with assistance ___ Intermittent supervision/assistance  ___ Bathe/dress independently ___ Walk with walker     _x__ Bathe/dress with assistance ___ Walk Independently    ___ Shower independently ___ Walk with assistance    ___ Shower with assistance ___ No alcohol     ___ Return to work/school ________  Special Instructions: No driving smoking or alcohol  Continue aspirin 81 mg daily and Plavix 75 mg daily until 04/03/2022 then aspirin alone   My questions have been answered and I understand these instructions. I will adhere to these goals and the provided educational materials after my discharge from the hospital.  Patient/Caregiver Signature _______________________________ Date __________  Clinician Signature _______________________________________ Date __________  Please bring this form and your medication list with you to all your follow-up doctor's appointments.

## 2022-03-25 NOTE — Progress Notes (Signed)
Physical Therapy Session Note  Patient Details  Name: Sheri Pope MRN: [id number] Date of Birth: October 29, 1940  Today's Date: 03/25/2022 PT Individual Time: 1042-1138 PT Individual Time Calculation (min): 56 min   Short Term Goals: Week 1:  PT Short Term Goal 1 (Week 1): STG = LTG d/t ELOS   Skilled Therapeutic Interventions/Progress Updates:  Patient seated upright in recliner on entrance to room. Patient alert and agreeable to PT session. Relates feeling fatigued and weaker this day. Has had session with OT and fatigued from session.   Noted bladder scanner in room and pt relates that she has been feeling as though she has to urinate more frequently with little actual urination and burning feeling after.   RN relates need for bladder scan again at end of session. Requested pt toilet prior to returning supine.   Therapeutic Activity: Transfers: Pt performed sit<>stand and stand pivot transfers throughout session with slight increase in CGA for balance upon standing and producing pivot stepping. Provided verbal cues for technique throughout.  Gait Training:  Pt ambulated across room with no AD and CGA. Attempt for longer amb distance with pt reaching 60 feet with no AD but increase in crossover stepping, lateral sway, and decreased step height/ length with RLE.   Neuromuscular Re-ed: Pt requests to participate in nustep activity again this session. Pt guided in continuous reciprocation of BLE using NuStep L3 x 32mn with focus on RLE muscle activation and motor  control. Pt relates feeling as though this bout on NuStep is more difficult than previous day despite all settings being the same. METs lower around 1.4-1.5 with slower pace noted.   NMR facilitated during session with focus on dynamic gait and balance. Pt guided in stepping through agility ladder with call out to pt for directions including forward, lateral and backward stepping. Pt with no AD and demos difficulty with backward  stepping and intermittently with forward steps. Up to Min/ Mod A with pt unable to produce effective step strategy to maintain balance. NMR performed for improvements in motor control and coordination, balance, sequencing, judgement, and self confidence/ efficacy in performing all aspects of mobility at highest level of independence.   Toilet transfer and cInternational aid/development workerwith supervision and performs pericare with ModI. Pt returns to supine with Mod I and no assist or  cuing required to complete. RN notified that pt is positioned for bladder scan. Patient supine at end of session with brakes locked, bed alarm set, and all needs within reach.   Therapy Documentation Precautions:  Precautions Precautions: Fall Precaution Comments: R hemi weakness, severe RA Restrictions Weight Bearing Restrictions: No General:   Vital Signs: Therapy Vitals Temp: 97.9 F (36.6 C) Pulse Rate: 81 Resp: 16 BP: 131/60 Patient Position (if appropriate): Lying Oxygen Therapy SpO2: 95 % O2 Device: Room Air Pain:  No pain related this session but increase in fatigue.   Therapy/Group: Individual Therapy  JAlger Simons1/27/2024, 8:01 AM

## 2022-03-25 NOTE — Progress Notes (Signed)
Physical Therapy Session Note  Patient Details  Name: Sheri Pope MRN: [id number] Date of Birth: [birth year]  Today's Date: 03/25/2022 PT Individual Time: 1540-1636 PT Individual Time Calculation (min): 56 min   Short Term Goals: Week 1:  PT Short Term Goal 1 (Week 1): STG = LTG d/t ELOS  Skilled Therapeutic Interventions/Progress Updates:    Pt received supine in bed with NT present and pt reporting need to use bathroom. Pt agreeable to therapy session; therefore, therapist assumed care of pt. Transitioned to sitting EOB, HOB elevated, with supervision. Pt reports significant fatigue today. Sit>stand EOB>RW with close supervision. Gait training in/out bathroom using RW with CGA for safety - pt able to lift AD up over bathroom threshold without assist. Standing with CGA/close supervision pt able to manage LB clothing without assist. Continent of bladder and bowels - noted to have red blood in toilet with pt reporting it is likely from having internal hemorrhoids. Peri-care dependently to ensure cleanliness.    Transported to/from gym in w/c for time management and energy conservation.  Sit<>stands, no AD, with CGA for steadying during session.   Gait training ~72f x2, no UE support, targeting dynamic balance with light min assist for balance - min reciprocal stepping pattern with incoordination in R LE and slight instability with 1x R LOB requiring mod assist for balance recovery - also targeting endurance training as well as pt fatigued by end of this gait distance.  Participated in Timed Up and Go (TUG): 1st trial: 20.5 seconds no AD but requiring min assist due to R LOB 2nd trial: 22.5 seconds using RW with close supervision/CGA 3rd trial: 20.6 seconds using RW with close supervision/CGA Patient demonstrates high fall risk as indicated by requiring >13.5seconds to complete the TUG.   Stair navigation training ascending/descending 8 steps (6" height) using B HRs with light min  assist/CGA - started with step-to pattern leading with L LE on ascent and R LE on descent progressed to reciprocal pattern for NMR.   Transported back to room with pt reporting need to toilet again.  Gait into bathroom, no UE support, with CGA for steadying. Standing with CGA performed LB clothing management without assist. Pt left seated on BSC over toilet with nurse present to assume care of pt.    Therapy Documentation Precautions:  Precautions Precautions: Fall Precaution Comments: R hemi weakness, severe RA Restrictions Weight Bearing Restrictions: No   Pain:  No reports of pain throughout session.  Balance: Standardized Balance Assessment Standardized Balance Assessment: Timed Up and Go Test Timed Up and Go Test TUG: Normal TUG Normal TUG (seconds): 20.6 (20.5 seconds min assist without AD due to R LOB; 22.5seconds using RW with close supervision/CGA and no LOB then 20.6seconds using RW)    Therapy/Group: Individual Therapy  CTawana Scale, PT, DPT, NCS, CSRS 03/25/2022, 3:32 PM

## 2022-03-26 MED ORDER — TROLAMINE SALICYLATE 10 % EX CREA: TOPICAL_CREAM | Freq: Two times a day (BID) | CUTANEOUS | Status: DC | PRN

## 2022-03-26 NOTE — Progress Notes (Signed)
+/-  sleep because of urinary frequency. Reports having trouble with frequency PTA. Declined scheduled senna s, multi BM's today. Chronic Pain managed with scheduled vicodin and robaxin. Decreased dexterity R/T bilateral hand contractures from RA.Patrici Ranks A

## 2022-03-26 NOTE — Progress Notes (Signed)
PROGRESS NOTE   Subjective/Complaints:  Still with urinary frequency. Appreciated that I started abx. Ucx with 80k GNR. Had multiple mushy stools yesterday too.   ROS: Patient denies fever, rash, sore throat, blurred vision, dizziness, nausea, vomiting, diarrhea, cough, shortness of breath or chest pain,   headache, or mood change.    Objective:   No results found. No results for input(s): "WBC", "HGB", "HCT", "PLT" in the last 72 hours.  Recent Labs    03/24/22 0602  NA 131*  K 3.9  CL 98  CO2 23  GLUCOSE 96  BUN 10  CREATININE 0.60  CALCIUM 9.2    Intake/Output Summary (Last 24 hours) at 03/26/2022 0909 Last data filed at 03/25/2022 2300 Gross per 24 hour  Intake 440 ml  Output --  Net 440 ml        Physical Exam: Vital Signs Blood pressure (!) 142/67, pulse 89, temperature 98 F (36.7 C), temperature source Oral, resp. rate 18, height '5\' 1"'$  (1.549 m), weight 42.8 kg, SpO2 96 %.   Constitutional: No distress . Vital signs reviewed. HEENT: NCAT, EOMI, oral membranes moist Neck: supple Cardiovascular: RRR without murmur. No JVD    Respiratory/Chest: CTA Bilaterally without wheezes or rales. Normal effort    GI/Abdomen: BS +, non-tender, non-distended Ext: no clubbing, cyanosis, or edema Psych: pleasant and cooperative  Skin: No evidence of breakdown, no evidence of rash Neurologic: Cranial nerves II through XII intact, motor strength is 4/5 in bilateral deltoid, bicep, tricep, grip, hip flexor, knee extensors, ankle dorsiflexor and plantar flexor Sensory exam normal sensation to light touch and proprioception in bilateral upper and lower extremities Cerebellar exam normal finger to nose to finger as well as heel to shin in bilateral upper and lower extremities Musculoskeletal: Rheumatoid joint deformities in both hands with ulnar deviation at the MCPs and PIPs.  Feet show no significant joint deformities.   Knees have full range of motion.  Healed incision from prior TKR on the left side. Shoulder range of motion is within functional limits.  Elbow range of motion within functional limits Rheumatoid nodules on bilateral Achilles tendons as well as TA tendons= no rheum changes  Assessment/Plan: 1. Functional deficits which require 3+ hours per day of interdisciplinary therapy in a comprehensive inpatient rehab setting. Physiatrist is providing close team supervision and 24 hour management of active medical problems listed below. Physiatrist and rehab team continue to assess barriers to discharge/monitor patient progress toward functional and medical goals  Care Tool:  Bathing    Body parts bathed by patient: Right arm, Left arm, Chest, Abdomen, Front perineal area, Buttocks, Right upper leg, Left upper leg, Face, Right lower leg, Left lower leg   Body parts bathed by helper: Right lower leg, Left lower leg     Bathing assist Assist Level: Contact Guard/Touching assist     Upper Body Dressing/Undressing Upper body dressing   What is the patient wearing?: Pull over shirt    Upper body assist Assist Level: Set up assist    Lower Body Dressing/Undressing Lower body dressing      What is the patient wearing?: Incontinence brief, Pants     Lower body assist  Assist for lower body dressing: Supervision/Verbal cueing     Toileting Toileting    Toileting assist Assist for toileting: Contact Guard/Touching assist     Transfers Chair/bed transfer  Transfers assist     Chair/bed transfer assist level: Contact Guard/Touching assist     Locomotion Ambulation   Ambulation assist      Assist level: Contact Guard/Touching assist Assistive device: Walker-rolling Max distance: 120   Walk 10 feet activity   Assist     Assist level: Contact Guard/Touching assist Assistive device: Walker-rolling   Walk 50 feet activity   Assist    Assist level: Contact  Guard/Touching assist Assistive device: Walker-rolling    Walk 150 feet activity   Assist Walk 150 feet activity did not occur: Safety/medical concerns         Walk 10 feet on uneven surface  activity   Assist Walk 10 feet on uneven surfaces activity did not occur: Safety/medical concerns         Wheelchair     Assist Is the patient using a wheelchair?: Yes Type of Wheelchair: Manual    Wheelchair assist level: Dependent - Patient 0% Max wheelchair distance: 300 ft    Wheelchair 50 feet with 2 turns activity    Assist        Assist Level: Dependent - Patient 0%   Wheelchair 150 feet activity     Assist      Assist Level: Dependent - Patient 0%   Blood pressure (!) 142/67, pulse 89, temperature 98 F (36.7 C), temperature source Oral, resp. rate 18, height '5\' 1"'$  (1.549 m), weight 42.8 kg, SpO2 96 %.  Medical Problem List and Plan: 1. 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 Very active despite jt disease             -patient may  shower             -ELOS/Goals: 03/31/22, mod I mobility/cognition and supervision with self-care   -Continue CIR therapies including PT, OT, and SLP   2.  Antithrombotics: -DVT/anticoagulation:  Mechanical: Antiembolism stockings, thigh (TED hose) Bilateral lower extremities             -antiplatelet therapy: Aspirin 81 mg daily and Plavix 75 mg daily x three weeks then aspirin alone/ Initiated 03/14/2022  3. Pain Management: Hydrocodone as needed, Robaxin 500 mg twice daily 4. Mood/Behavior/Sleep: Xanax 0.25 mg nightly for sleep.  Provide emotional support             -antipsychotic agents: N/A 5. Neuropsych/cognition: This patient is capable of making decisions on her own behalf. 6. Skin/Wound Care: Routine skin checks 7. Fluids/Electrolytes/Nutrition: Routine in and outs with follow-up chemistries 8.  Hyperlipidemia.  Lipitor 9.   Severe/advanced rheumatoid arthritis with multiple joint replacements.  Follow-up outpatient.             -pain seems relatively controlled at present 10.  History of tobacco use.  NicoDerm patch.  Provide counseling 11.  History of leukemia.  Follow-up outpatient Dr.Katragadda 12.  Chronic hyponatremia related to history of leukemia/SIADH.  Continue fluid restriction.             -f/u labs on admission 13.  Large thyroid gland.  Incidental finding of heterogeneous enlarged thyroid gland on CT angio head and neck.  Thyroid ultrasound consistent with goiter, no suspicious nodule seen.  Follow-up outpatient  TSH mildly elevated but normal T4 f/u PCP  14.  Mild HypoK KCL 39mq x 1 - recheck K+ is normal     Latest Ref Rng & Units 03/24/2022    6:02 AM 03/21/2022    7:57 AM 03/17/2022    3:29 AM  BMP  Glucose 70 - 99 mg/dL 96  142    BUN 8 - 23 mg/dL 10  12    Creatinine 0.44 - 1.00 mg/dL 0.60  0.72    Sodium 135 - 145 mmol/L 131  132    Potassium 3.5 - 5.1 mmol/L 3.9  3.4  3.7   Chloride 98 - 111 mmol/L 98  99    CO2 22 - 32 mmol/L 23  26    Calcium 8.9 - 10.3 mg/dL 9.2  9.0     15. Dysuria and frequency- 80k GNR -1/27 began empiric keflex '500mg'$  bid -1/28 await UCX Sens.  -(type 4 stool) hold stool softeners while on abx   LOS: 6 days A FACE TO FACE EVALUATION WAS PERFORMED  ZMeredith Staggers1/28/2024, 9:09 AM

## 2022-03-27 LAB — URINE CULTURE: Culture: 80000 — AB

## 2022-03-27 NOTE — Progress Notes (Signed)
Occupational Therapy Session Note  Patient Details  Name: Sheri Pope MRN: [id number] Date of Birth: November 04, 1940  Today's Date: 03/27/2022 OT Individual Time: 5176-1607 session 1 OT Individual Time Calculation (min): 60 min  Session 2: 1010-1056   Short Term Goals: Week 1:  OT Short Term Goal 1 (Week 1): Pt will demonstrate improved balance to be able to pull pants over hips with CGA. OT Short Term Goal 2 (Week 1): Pt will don shoes with min A. OT Short Term Goal 3 (Week 1): Pt will be able to ambulate in kitchen with RW and retrieve items from shelves with CGA.  Skilled Therapeutic Interventions/Progress Updates:  Session 1: Pt greeted supine in bed, pt agreeable to OT intervention. Session focus on BADL reeducation, functional mobility, dynamic standing balance and decreasing overall caregiver burden.                     Pt completed supine >sit with use of bed features with supervision, pt completed dressing from EOB with overall supervision. Pt completed functional mobility to sink with Rw and supervision. Pt stood at sink for grooming tasks with supervision. Pt uses both hands to brush teeth d/t hand deformities pt did need assist to open toothpaste but reports at home she leaves the cap off so that its easier to manipulate.    Pt completed ambulatory toilet transfer with Rw and supervision, + urine void with pt able to complete 3/3 toileting tasks with supervision, + time needed to manage underwear d/t digit deformities, suggested ordering larger size in underwear in order to make under garments easier to manipulate.   Transported pt to gym in w/c with total A. Started with warm up lap around gym with Rw and supervision to increase functional endurance for ADL participation. Pt completed x10 sit>stands from w/c while holding ball to facilitate improved LB strength/ednurance and challenge dynamic balance, pt completed task with CGA.   Pt completed dynamic balance task where pt stood  on airex cushion to engage in standing therapeutic activity of engaging in corn hole. Pt instructed to reach across midline to retrieve bean bags with LUE and toss to target pt competed task with CGA. Graded task up and had pt retrieve bean bags from floor to simulate picking up ADL items from floor level.  Pt completed task with [facility] as pt needs BUEs to p/u any object. Reviewed safe strategies for reaching to floor level.  Pt completed functional ambulation back to room with RW and supervision, pt with minor LOB when turning to enter bathroom threshold needing MINA  to recover ( pts LLE got caught behind RLE when turning). Pt completed toileting tasks as previously indicated.   Ended session with pt seated in recliner with all needs within reach and safety belt alarm activated.                   Session 2: pt greeted seated in recliner. Discussed IADL needs for home. Pt reports the only things we haven't covered are how to empty her BSC and making simple meals in microwave. Recommended that pt line her BSC with grocery bag with handles and then remove bag after urien void, tie it off and transport to trash can with RW. Pt able to demo task with + time but overall supervision.  Pt also reports that at her microwave she is able to remove food contents from microwave and then place on table without having to transport food item, pt does  have Rw bag at home to transport items if needed. If she needs to, recommended that pt put food contents in [place], place Tupperware in bag and then transport items.pt agreeable to recommendation.   Ended session with pt seated in recliner with all needs within reach and safety belt alarm activated.    Therapy Documentation Precautions:  Precautions Precautions: Fall Precaution Comments: R hemi weakness, severe RA Restrictions Weight Bearing Restrictions: No General:   Vital Signs:  Pain: Session 1: 8/10 pain in L shoulder, asked MD for K pad order, rest  breaks provided as needed.  Session 2: no pain reported   Therapy/Group: Individual Therapy  Corinne Ports [facility] 03/27/2022, 12:09 PM

## 2022-03-27 NOTE — Progress Notes (Signed)
Urinary frequency, up to BR 6 times thus far this shift. Pain managed with scheduled meds. Scheduled xanax given at 2027, for insomnia. Requesting aspercreme for bilateral shoulders, med not on hospital formulary. Doesn't want any substitutions. Heat pack applied to left shoulder, with some relief. Sheri Pope A

## 2022-03-27 NOTE — Progress Notes (Signed)
Physical Therapy Session Note  Patient Details  Name: Sheri Pope MRN: [id number] Date of Birth: 1940-05-23  Today's Date: 03/27/2022 PT Individual Time: 1406-1510 PT Individual Time Calculation (min): 64 min   Short Term Goals: Week 1:  PT Short Term Goal 1 (Week 1): STG = LTG d/t ELOS  Skilled Therapeutic Interventions/Progress Updates:  Patient seated upright in recliner with BLE elevated on entrance to room. Patient alert and agreeable to PT session.   Discussed with pt potential for leaving hospital earlier than planned d/c date of Friday 2/2. Potential for pt to leave on Wed 1/31.   Patient with no pain complaint at start of session.  Therapeutic Activity: Transfers: Pt performed sit<>stand and stand pivot transfers throughout session with supervision. Provided verbal cues for reaching back to armrests prior to descent to sit.   Gait Training:  Pt ambulated >200 ft using RW with close supervision and w/c follow for fatigue. Demonstrated flexed forward posture. Provided vc/ tc for energy conservation. Pt is able to ambulate up/ down ramp using RW and through mulch pit to simulate ambulating through yard requiring picking up of RW for balance. Pt performs all with close supervision.   Ambulated to ADL apartment to perform bed mobility including use of step to get into/ out of bed. Pt demos backward lean to bed, then side step to L placing foot on step, then R foot and boost back onto bed from step. Pt then performs bed mobility with IND. Exits bed with Bil feet on step and lateral step off to R side while boosting from bed.   Pt turns quickly to ambulate to toilet from bed but loses balance to R and leans into bed. Is able to regain balance with supervision and completes ambulation to toilet. Time taken to alert pt to need for supervision with mobility OOB at this time. May wish to complete HHPT and follow up with neuro PT to further progress balance.   Toilet transfer x2 during  session with supervision including pericare and clothing mgmt.   Patient seated upright in recliner with BLE elevated at end of session with brakes locked, belt alarm set, and all needs within reach.   Therapy Documentation Precautions:  Precautions Precautions: Fall Precaution Comments: R hemi weakness, severe RA Restrictions Weight Bearing Restrictions: No General:   Vital Signs: Therapy Vitals Temp: 98.1 F (36.7 C) Temp Source: Oral Pulse Rate: 78 Resp: 18 BP: (!) 151/61 Patient Position (if appropriate): Sitting Oxygen Therapy SpO2: 98 % O2 Device: Room Air Pain:  L shoulder aching on entrance to room with k-pad already donned to shoulders.   Therapy/Group: Individual Therapy  Alger Simons PT, DPT, CSRS 03/27/2022, 4:53 PM

## 2022-03-27 NOTE — Progress Notes (Signed)
Received K-pad for patient. K-pad applied to upper back toward left shoulder where patient stated pain presented. Patient presents with barrier between skin and k-pad. Patient in recliner with alarm on and call bell at side of patient. Patient daughter is present in room. Sanda Linger, RN

## 2022-03-27 NOTE — Plan of Care (Signed)
  Problem: Consults Goal: RH STROKE PATIENT EDUCATION Description: See Patient Education module for education specifics  Outcome: Progressing   Problem: RH SAFETY Goal: RH STG ADHERE TO SAFETY PRECAUTIONS W/ASSISTANCE/DEVICE Description: STG Adhere to Safety Precautions With cues Assistance/Device. Outcome: Progressing   Problem: RH PAIN MANAGEMENT Goal: RH STG PAIN MANAGED AT OR BELOW PT'S PAIN GOAL Description: < 4 with prns Outcome: Progressing   Problem: RH KNOWLEDGE DEFICIT Goal: RH STG INCREASE KNOWLEGDE OF HYPERLIPIDEMIA Description: Patient and family will be able to manage HLD with medications and dietary modifications using educational resources independently Outcome: Progressing Goal: RH STG INCREASE KNOWLEDGE OF STROKE PROPHYLAXIS Description: Patient and family will be able to manage secondary stroke risks with medications and dietary modifications using educational resources independently Outcome: Progressing

## 2022-03-27 NOTE — Progress Notes (Signed)
Patient presented with new order for aquathermia. Order acknowledged and [facility] called for device. [facility] informed nursing that they are out of equipment and that they will bring on as soon as available. Patient give heating pack until device is brought to unit. Patient did have scheduled medication this morning for pain. Sanda Linger, RN

## 2022-03-27 NOTE — Progress Notes (Signed)
Occupational Therapy Session Note  Patient Details  Name: Sheri Pope MRN: [id number] Date of Birth: 11-26-1940  Today's Date: 03/27/2022 OT Individual Time: 1533-1606 OT Individual Time Calculation (min): 33 min    Short Term Goals: Week 1:  OT Short Term Goal 1 (Week 1): Pt will demonstrate improved balance to be able to pull pants over hips with CGA. OT Short Term Goal 2 (Week 1): Pt will don shoes with min A. OT Short Term Goal 3 (Week 1): Pt will be able to ambulate in kitchen with RW and retrieve items from shelves with CGA.  Skilled Therapeutic Interventions/Progress Updates:    Pt sitting in recliner with kpad on L shoulder.  Pt stated her L arm arthritis was flared up. Pt preferred not to do any activity at this time but was agreeable to working on continued problem solving on how to empty her BSC at night. Pt will wear incontinence pull ups at night for protection but if she needs to go at night she will use the [facility].  In earlier session, problem solved with COTA on how to empty the bucket the next morning. Continued to problem solve this but suggesting she place grocery bags in bucket, then add paper towels to absorb liquid to avoid leaking out of back.  Have a large cup with hook handle to place on RW. Pt then can pick up bag and place in cup (using hospital plastic cup) to carry to bathroom so she does not have to worry about dropping it or hitting it against doorway.   Pt thought this could work and we will try this technique tomorrow.   Then pt recalled all of the AROM exercises she has been working on in OT as I wrote them down for her HEP.  Pt resting in recliner with all needs met.    Therapy Documentation Precautions:  Precautions Precautions: Fall Precaution Comments: R hemi weakness, severe RA Restrictions Weight Bearing Restrictions: No Pain: L shoulder pain,  kpad for treatment    ADL: ADL Eating: Set up Grooming: Setup Upper Body Bathing: Setup Where  Assessed-Upper Body Bathing: Shower Lower Body Bathing: Contact guard Where Assessed-Lower Body Bathing: Shower Upper Body Dressing: Setup Where Assessed-Upper Body Dressing: Edge of bed Lower Body Dressing: Supervision/safety Where Assessed-Lower Body Dressing: Edge of bed Toileting: Supervision/safety Where Assessed-Toileting: Glass blower/designer: Close supervision Armed forces technical officer Method: Counselling psychologist: Raised toilet seat Tub/Shower Equipment: Walk in Facilities manager Transfer: Curator Method: Heritage manager: Radio broadcast assistant, Grab bars   Therapy/Group: Individual Therapy  Sutton Hirsch 03/27/2022, 12:52 PM

## 2022-03-27 NOTE — Progress Notes (Signed)
PROGRESS NOTE   Subjective/Complaints: Left shoulder pain flared up , no trauma Ucx with 80k GNR. Had multiple mushy stools yesterday too.   ROS: Patient denies CP, SOB, N/V/D  Objective:   No results found. No results for input(s): "WBC", "HGB", "HCT", "PLT" in the last 72 hours.  No results for input(s): "NA", "K", "CL", "CO2", "GLUCOSE", "BUN", "CREATININE", "CALCIUM" in the last 72 hours.   Intake/Output Summary (Last 24 hours) at 03/27/2022 0834 Last data filed at 03/26/2022 2027 Gross per 24 hour  Intake 1038 ml  Output --  Net 1038 ml         Physical Exam: Vital Signs Blood pressure (!) 149/73, pulse 83, temperature 98 F (36.7 C), resp. rate 18, height '5\' 1"'$  (1.549 m), weight 42.8 kg, SpO2 95 %.    General: No acute distress Mood and affect are appropriate Heart: Regular rate and rhythm no rubs murmurs or extra sounds Lungs: Clear to auscultation, breathing unlabored, no rales or wheezes Abdomen: Positive bowel sounds, soft nontender to palpation, nondistended Extremities: No clubbing, cyanosis, or edema   Neurologic: Cranial nerves II through XII intact, motor strength is 4/5 in bilateral deltoid, bicep, tricep, grip, hip flexor, knee extensors, ankle dorsiflexor and plantar flexor Sensory exam normal sensation to light touch and proprioception in bilateral upper and lower extremities Cerebellar exam normal finger to nose to finger as well as heel to shin in bilateral upper and lower extremities Musculoskeletal: Rheumatoid joint deformities in both hands with ulnar deviation at the MCPs and PIPs.  Feet show no significant joint deformities.  Knees have full range of motion.  Healed incision from prior TKR on the left side. Shoulder range of motion is within functional limits.  Elbow range of motion within functional limits Rheumatoid nodules on bilateral Achilles tendons as well as TA tendons also on  ulnar aspect of L elbow  Assessment/Plan: 1. Functional deficits which require 3+ hours per day of interdisciplinary therapy in a comprehensive inpatient rehab setting. Physiatrist is providing close team supervision and 24 hour management of active medical problems listed below. Physiatrist and rehab team continue to assess barriers to discharge/monitor patient progress toward functional and medical goals  Care Tool:  Bathing    Body parts bathed by patient: Right arm, Left arm, Chest, Abdomen, Front perineal area, Buttocks, Right upper leg, Left upper leg, Face, Right lower leg, Left lower leg   Body parts bathed by helper: Right lower leg, Left lower leg     Bathing assist Assist Level: Contact Guard/Touching assist     Upper Body Dressing/Undressing Upper body dressing   What is the patient wearing?: Pull over shirt    Upper body assist Assist Level: Set up assist    Lower Body Dressing/Undressing Lower body dressing      What is the patient wearing?: Incontinence brief, Pants     Lower body assist Assist for lower body dressing: Supervision/Verbal cueing     Toileting Toileting    Toileting assist Assist for toileting: Contact Guard/Touching assist     Transfers Chair/bed transfer  Transfers assist     Chair/bed transfer assist level: Contact Guard/Touching assist     Locomotion  Ambulation   Ambulation assist      Assist level: Contact Guard/Touching assist Assistive device: Walker-rolling Max distance: 120   Walk 10 feet activity   Assist     Assist level: Contact Guard/Touching assist Assistive device: Walker-rolling   Walk 50 feet activity   Assist    Assist level: Contact Guard/Touching assist Assistive device: Walker-rolling    Walk 150 feet activity   Assist Walk 150 feet activity did not occur: Safety/medical concerns         Walk 10 feet on uneven surface  activity   Assist Walk 10 feet on uneven surfaces  activity did not occur: Safety/medical concerns         Wheelchair     Assist Is the patient using a wheelchair?: Yes Type of Wheelchair: Manual    Wheelchair assist level: Dependent - Patient 0% Max wheelchair distance: 300 ft    Wheelchair 50 feet with 2 turns activity    Assist        Assist Level: Dependent - Patient 0%   Wheelchair 150 feet activity     Assist      Assist Level: Dependent - Patient 0%   Blood pressure (!) 149/73, pulse 83, temperature 98 F (36.7 C), resp. rate 18, height '5\' 1"'$  (1.549 m), weight 42.8 kg, SpO2 95 %.  Medical Problem List and Plan: 1. 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 Very active despite jt disease             -patient may  shower             -ELOS/Goals: 03/31/22, mod I mobility/cognition and supervision with self-care   -Continue CIR therapies including PT, OT, and SLP   2.  Antithrombotics: -DVT/anticoagulation:  Mechanical: Antiembolism stockings, thigh (TED hose) Bilateral lower extremities             -antiplatelet therapy: Aspirin 81 mg daily and Plavix 75 mg daily x three weeks then aspirin alone/ Initiated 03/14/2022  3. Pain Management: Hydrocodone as needed, Robaxin 500 mg twice daily 4. Mood/Behavior/Sleep: Xanax 0.25 mg nightly for sleep.  Provide emotional support             -antipsychotic agents: N/A 5. Neuropsych/cognition: This patient is capable of making decisions on her own behalf. 6. Skin/Wound Care: Routine skin checks 7. Fluids/Electrolytes/Nutrition: Routine in and outs with follow-up chemistries 8.  Hyperlipidemia.  Lipitor 9.  Severe/advanced rheumatoid arthritis with multiple joint replacements.  Follow-up outpatient.             -Flare up of left shoulder pain, order Kpad and aspercreme 10.  History of tobacco use.  NicoDerm patch.  Provide counseling 11.  History of leukemia.  Follow-up outpatient  Dr.Katragadda 12.  Chronic hyponatremia related to history of leukemia/SIADH.  Continue fluid restriction.             -f/u labs on admission 13.  Large thyroid gland.  Incidental finding of heterogeneous enlarged thyroid gland on CT angio head and neck.  Thyroid ultrasound consistent with goiter, no suspicious nodule seen.  Follow-up outpatient  TSH mildly elevated but normal T4 f/u PCP  14.  Mild HypoK KCL 74mq x 1 - recheck K+ is normal     Latest Ref Rng & Units 03/24/2022    6:02 AM 03/21/2022    7:57 AM 03/17/2022    3:29 AM  BMP  Glucose 70 - 99 mg/dL 96  142    BUN 8 - 23 mg/dL 10  12    Creatinine 0.44 - 1.00 mg/dL 0.60  0.72    Sodium 135 - 145 mmol/L 131  132    Potassium 3.5 - 5.1 mmol/L 3.9  3.4  3.7   Chloride 98 - 111 mmol/L 98  99    CO2 22 - 32 mmol/L 23  26    Calcium 8.9 - 10.3 mg/dL 9.2  9.0     15. Dysuria and frequency- 80k GNR -1/27 began empiric keflex '500mg'$  bid -1/28 await UCX Sens.  -(type 4 stool) hold stool softeners while on abx   LOS: 7 days A FACE TO FACE EVALUATION WAS PERFORMED  Charlett Blake 03/27/2022, 8:34 AM

## 2022-03-28 ENCOUNTER — Other Ambulatory Visit (HOSPITAL_COMMUNITY): Payer: Self-pay

## 2022-03-28 MED ORDER — HYDROCODONE-ACETAMINOPHEN 5-325 MG PO TABS
0.5000 | ORAL_TABLET | Freq: Two times a day (BID) | ORAL | 0 refills | Status: AC | PRN
  Filled 2022-03-28: qty 30, 30d supply, fill #0

## 2022-03-28 MED ORDER — CEPHALEXIN 500 MG PO CAPS
500.0000 mg | ORAL_CAPSULE | Freq: Two times a day (BID) | ORAL | 0 refills | Status: DC
  Filled 2022-03-28: qty 6, 3d supply, fill #0

## 2022-03-28 MED ORDER — ALPRAZOLAM 0.25 MG PO TABS
0.2500 mg | ORAL_TABLET | Freq: Every day | ORAL | 0 refills | Status: AC
  Filled 2022-03-28: qty 30, 30d supply, fill #0

## 2022-03-28 MED ORDER — CLOPIDOGREL BISULFATE 75 MG PO TABS
75.0000 mg | ORAL_TABLET | Freq: Every day | ORAL | 0 refills | Status: DC
  Filled 2022-03-28: qty 5, 5d supply, fill #0

## 2022-03-28 MED ORDER — NICOTINE 21 MG/24HR TD PT24
MEDICATED_PATCH | TRANSDERMAL | 0 refills | Status: DC
  Filled 2022-03-28: qty 28, fill #0

## 2022-03-28 MED ORDER — ACETAMINOPHEN 325 MG PO TABS: 650.0000 mg | ORAL_TABLET | Freq: Four times a day (QID) | ORAL | Status: AC | PRN

## 2022-03-28 MED ORDER — ATORVASTATIN CALCIUM 40 MG PO TABS
40.0000 mg | ORAL_TABLET | Freq: Every day | ORAL | 0 refills | Status: DC
  Filled 2022-03-28: qty 30, 30d supply, fill #0

## 2022-03-28 MED ORDER — METHOCARBAMOL 500 MG PO TABS
500.0000 mg | ORAL_TABLET | Freq: Two times a day (BID) | ORAL | 0 refills | Status: AC
  Filled 2022-03-28: qty 60, 30d supply, fill #0

## 2022-03-28 NOTE — Progress Notes (Signed)
PROGRESS NOTE   Subjective/Complaints:   Shoulder pain better with trolamine creme Discussed pain meds, prescribed by PCP Dr Delphina Cahill   ROS: Patient denies CP, SOB, N/V/D  Objective:   No results found. No results for input(s): "WBC", "HGB", "HCT", "PLT" in the last 72 hours.  No results for input(s): "NA", "K", "CL", "CO2", "GLUCOSE", "BUN", "CREATININE", "CALCIUM" in the last 72 hours.   Intake/Output Summary (Last 24 hours) at 03/28/2022 0736 Last data filed at 03/27/2022 2300 Gross per 24 hour  Intake 1058 ml  Output --  Net 1058 ml         Physical Exam: Vital Signs Blood pressure (!) 152/79, pulse 83, temperature 97.9 F (36.6 C), temperature source Oral, resp. rate 16, height '5\' 1"'$  (1.549 m), weight 44 kg, SpO2 95 %.    General: No acute distress Mood and affect are appropriate Heart: Regular rate and rhythm no rubs murmurs or extra sounds Lungs: Clear to auscultation, breathing unlabored, no rales or wheezes Abdomen: Positive bowel sounds, soft nontender to palpation, nondistended Extremities: No clubbing, cyanosis, or edema   Neurologic: Cranial nerves II through XII intact, motor strength is 4/5 in bilateral deltoid, bicep, tricep, grip, hip flexor, knee extensors, ankle dorsiflexor and plantar flexor Sensory exam normal sensation to light touch and proprioception in bilateral upper and lower extremities Cerebellar exam normal finger to nose to finger as well as heel to shin in bilateral upper and lower extremities Musculoskeletal: Rheumatoid joint deformities in both hands with ulnar deviation at the MCPs and PIPs.  Feet show no significant joint deformities.  Knees have full range of motion.  Healed incision from prior TKR on the left side. Shoulder range of motion is within functional limits.  Elbow range of motion within functional limits Rheumatoid nodules on bilateral Achilles tendons as well as  TA tendons also on ulnar aspect of L elbow  Assessment/Plan: 1. Functional deficits which require 3+ hours per day of interdisciplinary therapy in a comprehensive inpatient rehab setting. Physiatrist is providing close team supervision and 24 hour management of active medical problems listed below. Physiatrist and rehab team continue to assess barriers to discharge/monitor patient progress toward functional and medical goals  Care Tool:  Bathing    Body parts bathed by patient: Right arm, Left arm, Chest, Abdomen, Front perineal area, Buttocks, Right upper leg, Left upper leg, Face, Right lower leg, Left lower leg   Body parts bathed by helper: Right lower leg, Left lower leg     Bathing assist Assist Level: Contact Guard/Touching assist     Upper Body Dressing/Undressing Upper body dressing   What is the patient wearing?: Pull over shirt    Upper body assist Assist Level: Set up assist    Lower Body Dressing/Undressing Lower body dressing      What is the patient wearing?: Incontinence brief, Pants     Lower body assist Assist for lower body dressing: Supervision/Verbal cueing     Toileting Toileting    Toileting assist Assist for toileting: Contact Guard/Touching assist     Transfers Chair/bed transfer  Transfers assist     Chair/bed transfer assist level: Contact Guard/Touching assist  Locomotion Ambulation   Ambulation assist      Assist level: Contact Guard/Touching assist Assistive device: Walker-rolling Max distance: 120   Walk 10 feet activity   Assist     Assist level: Contact Guard/Touching assist Assistive device: Walker-rolling   Walk 50 feet activity   Assist    Assist level: Contact Guard/Touching assist Assistive device: Walker-rolling    Walk 150 feet activity   Assist Walk 150 feet activity did not occur: Safety/medical concerns         Walk 10 feet on uneven surface  activity   Assist Walk 10 feet on  uneven surfaces activity did not occur: Safety/medical concerns         Wheelchair     Assist Is the patient using a wheelchair?: Yes Type of Wheelchair: Manual    Wheelchair assist level: Dependent - Patient 0% Max wheelchair distance: 300 ft    Wheelchair 50 feet with 2 turns activity    Assist        Assist Level: Dependent - Patient 0%   Wheelchair 150 feet activity     Assist      Assist Level: Dependent - Patient 0%   Blood pressure (!) 152/79, pulse 83, temperature 97.9 F (36.6 C), temperature source Oral, resp. rate 16, height '5\' 1"'$  (1.549 m), weight 44 kg, SpO2 95 %.  Medical Problem List and Plan: 1. 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 Very active despite jt disease             -patient may  shower             -ELOS/Goals: 03/29/22, mod I mobility/cognition and supervision with self-care   -Continue CIR therapies including PT, OT, and SLP   2.  Antithrombotics: -DVT/anticoagulation:  Mechanical: Antiembolism stockings, thigh (TED hose) Bilateral lower extremities             -antiplatelet therapy: Aspirin 81 mg daily and Plavix 75 mg daily x three weeks then aspirin alone/ Initiated 03/14/2022  3. Pain Management: Hydrocodone as needed, takes 1/2  (2.'5mg'$ ) BID prescribed by PCP Dr Nevada Crane.  , Robaxin 500 mg twice daily 4. Mood/Behavior/Sleep: Xanax 0.25 mg nightly for sleep.  Provide emotional support             -antipsychotic agents: N/A 5. Neuropsych/cognition: This patient is capable of making decisions on her own behalf. 6. Skin/Wound Care: Routine skin checks 7. Fluids/Electrolytes/Nutrition: Routine in and outs with follow-up chemistries 8.  Hyperlipidemia.  Lipitor 9.  Severe/advanced rheumatoid arthritis with multiple joint replacements.  Follow-up outpatient.             -Flare up of left shoulder pain, order Kpad and aspercreme 10.  History of tobacco use.   NicoDerm patch.  Provide counseling 11.  History of leukemia.  Follow-up outpatient Dr.Katragadda 12.  Chronic hyponatremia related to history of leukemia/SIADH.  Continue fluid restriction.             -f/u labs on admission 13.  Large thyroid gland.  Incidental finding of heterogeneous enlarged thyroid gland on CT angio head and neck.  Thyroid ultrasound consistent with goiter, no suspicious nodule seen.  Follow-up outpatient  TSH mildly elevated but normal T4 f/u PCP  14.  Mild HypoK KCL 24mq x 1 - recheck K+ is normal     Latest Ref Rng & Units 03/24/2022  6:02 AM 03/21/2022    7:57 AM 03/17/2022    3:29 AM  BMP  Glucose 70 - 99 mg/dL 96  142    BUN 8 - 23 mg/dL 10  12    Creatinine 0.44 - 1.00 mg/dL 0.60  0.72    Sodium 135 - 145 mmol/L 131  132    Potassium 3.5 - 5.1 mmol/L 3.9  3.4  3.7   Chloride 98 - 111 mmol/L 98  99    CO2 22 - 32 mmol/L 23  26    Calcium 8.9 - 10.3 mg/dL 9.2  9.0     15. Dysuria and frequency- 80k GNR -1/27 began empiric keflex '500mg'$  bid UCx pansensitive e coli -(type 4 stool) hold stool softeners while on abx  16.  Mild systolic HTN, f/u with PCP - was not taking meds at home PTA  LOS: 8 days A FACE TO FACE EVALUATION WAS PERFORMED  Charlett Blake 03/28/2022, 7:36 AM

## 2022-03-28 NOTE — Plan of Care (Signed)
  Problem: RH Balance Goal: LTG: Patient will maintain dynamic sitting balance (OT) Description: LTG:  Patient will maintain dynamic sitting balance with assistance during activities of daily living (OT) Outcome: Completed/Met Goal: LTG Patient will maintain dynamic standing with ADLs (OT) Description: LTG:  Patient will maintain dynamic standing balance with assist during activities of daily living (OT)  Outcome: Completed/Met   Problem: Sit to Stand Goal: LTG:  Patient will perform sit to stand in prep for activites of daily living with assistance level (OT) Description: LTG:  Patient will perform sit to stand in prep for activites of daily living with assistance level (OT) Outcome: Completed/Met   Problem: RH Bathing Goal: LTG Patient will bathe all body parts with assist levels (OT) Description: LTG: Patient will bathe all body parts with assist levels (OT) Outcome: Completed/Met   Problem: RH Dressing Goal: LTG Patient will perform upper body dressing (OT) Description: LTG Patient will perform upper body dressing with assist, with/without cues (OT). Outcome: Completed/Met Goal: LTG Patient will perform lower body dressing w/assist (OT) Description: LTG: Patient will perform lower body dressing with assist, with/without cues in positioning using equipment (OT) Outcome: Completed/Met   Problem: RH Toileting Goal: LTG Patient will perform toileting task (3/3 steps) with assistance level (OT) Description: LTG: Patient will perform toileting task (3/3 steps) with assistance level (OT)  Outcome: Completed/Met   Problem: RH Simple Meal Prep Goal: LTG Patient will perform simple meal prep w/assist (OT) Description: LTG: Patient will perform simple meal prep with assistance, with/without cues (OT). Outcome: Completed/Met   Problem: RH Light Housekeeping Goal: LTG Patient will perform light housekeeping w/assist (OT) Description: LTG: Patient will perform light housekeeping with  assistance, with/without cues (OT). Outcome: Completed/Met   Problem: RH Toilet Transfers Goal: LTG Patient will perform toilet transfers w/assist (OT) Description: LTG: Patient will perform toilet transfers with assist, with/without cues using equipment (OT) Outcome: Completed/Met   Problem: RH Tub/Shower Transfers Goal: LTG Patient will perform tub/shower transfers w/assist (OT) Description: LTG: Patient will perform tub/shower transfers with assist, with/without cues using equipment (OT) Outcome: Completed/Met

## 2022-03-28 NOTE — Progress Notes (Signed)
Inpatient Rehabilitation Care Coordinator Discharge Note   Patient Details  Name: Sheri Pope MRN: [id number] Date of Birth: 1941-01-05   Discharge location: Home with PRN assist from niece  Length of Stay: 9 Days  Discharge activity level: Mod I/sup  Home/community participation: niece  Patient response [id number] Literacy - How often do you need to have someone help you when you read instructions, pamphlets, or other written material from your doctor or pharmacy?: Sometimes  Patient response OY:[id number] Isolation - How often do you feel lonely or isolated from those around you?: Never  Services provided included: SW, Pharmacy, TR, CM, RN, SLP, OT, PT, RD, MD  Financial Services:  Financial Services Utilized: [place] [employer]  Choices offered to/list presented to: patient  Follow-up services arranged:  [place]: [place] [facility] OT         Patient response to transportation need: Is the patient able to respond to transportation needs?: Yes In the past 12 months, has lack of transportation kept you from medical appointments or from getting medications?: No In the past 12 months, has lack of transportation kept you from meetings, work, or from getting things needed for daily living?: No    Comments (or additional information):  Patient/Family verbalized understanding of follow-up arrangements:  Yes  Individual responsible for coordination of the follow-up plan: self or Sheri Pope (niece)  Confirmed correct DME delivered: Sheri Pope 03/28/2022    Sheri Pope

## 2022-03-28 NOTE — Progress Notes (Signed)
Physical Therapy Discharge Summary  Patient Details  Name: Sheri Pope MRN: [id number] Date of Birth: 01/21/1941  Date of Discharge from PT service:March 28, 2022  Today's Date: 03/28/2022 PT Individual Time: 5056-9794 PT Individual Time Calculation (min): 32 min    Patient has met {NUMBERS 0-12:18577} of {NUMBERS 0-12:18577} long term goals due to {due IA:1655374}.  Patient to discharge at [facility] level {LOA:3049010}.   Patient's care partner {care partner:3041650} to provide the necessary {assistance:3041652} assistance at discharge.  Reasons goals not met: ***  Recommendation:  Patient will benefit from ongoing skilled PT services in {setting:3041680} to continue to advance safe functional mobility, address ongoing impairments in ***, and minimize fall risk.  Equipment: {equipment:3041657}  Reasons for discharge: {Reason for discharge:3049018}  Patient/family agrees with progress made and goals achieved: {Pt/Family agree with progress/goals:3049020}  PT Discharge Precautions/Restrictions   Vital Signs  Pain   Pain Interference   Vision/Perception     Cognition   Sensation   Motor     Mobility   Locomotion     Trunk/Postural Assessment     Balance   Extremity Assessment            Alger Simons 03/28/2022, 6:36 PM

## 2022-03-28 NOTE — Progress Notes (Addendum)
Patient ID: Sheri Pope, female   DOB: [birth year], 82 y.o.   MRN: [id number]  Niece informed of d/c and is able to pick up patient for transport. [facility] referral sent to [facility]  Patient approved. SOC 2/1

## 2022-03-28 NOTE — Progress Notes (Signed)
Inpatient Rehabilitation Discharge Medication Review by a Pharmacist  A complete drug regimen review was completed for this patient to identify any potential clinically significant medication issues.  High Risk Drug Classes Is patient taking? Indication by Medication  Antipsychotic No   Anticoagulant No   Antibiotic Yes Cephalexin po - UTI  Opioid Yes Vicodin prn moderate pain  Antiplatelet Yes Aspirin, clopidogrel x 5 additional days- CVA  Hypoglycemics/insulin No   Vasoactive Medication No   Chemotherapy No   Other Yes Methocarbamol - muscle spasms  Atorvastatin - HLD Alprazolam - sleep/anxiety     Type of Medication Issue Identified Description of Issue Recommendation(s)  Drug Interaction(s) (clinically significant)     Duplicate Therapy     Allergy     No Medication Administration End Date     Incorrect Dose     Additional Drug Therapy Needed     Significant med changes from prior encounter (inform family/care partners about these prior to discharge).    Other       Clinically significant medication issues were identified that warrant physician communication and completion of prescribed/recommended actions by midnight of the next day:  No  Pharmacist comments: None  Time spent performing this drug regimen review (minutes): 30 minutes   Tad Moore 03/28/2022 10:23 AM

## 2022-03-28 NOTE — Progress Notes (Signed)
Physical Therapy Session Note  Patient Details  Name: Sheri Pope MRN: [id number] Date of Birth: 03-07-1940  Today's Date: 03/28/2022 PT Individual Time: 0800-0856 PT Individual Time Calculation (min): 56 min   Short Term Goals: Week 1:  PT Short Term Goal 1 (Week 1): STG = LTG d/t ELOS  Skilled Therapeutic Interventions/Progress Updates:    Chart reviewed and pt agreeable to therapy. Pt received seated EOB with no c/o pain. Session focused on ambualtion endurance and balance to promote safe home mobility. Pt initiated session with set up assistance for lower body dressing and amb transfer to toilet using supervision + RW. Pt then transferred to first floor with noted stop at nurse station for medication administration by RN. Pt then completed >464f amb with supervision + RW. Pt cued to monitor energy level and [facility] Pt then returned to 4th floor. Pt again completed 2057famb with supervision + RW with successful monitoring of energy conservation. Pt returned to room and completed balance exercise series of neutral stance, narrow stance, neutral stance + eyes closed, and reaches outside BOS for 2x 30sec each + CGA. At end of session, [facility], nurse call bell and all needs in reach.     Therapy Documentation Precautions:  Precautions Precautions: Fall Precaution Comments: R hemi weakness, severe RA Restrictions Weight Bearing Restrictions: No     Therapy/Group: Individual Therapy  KiMarquette OldPT, DPT 03/28/2022, 9:26 AM

## 2022-03-28 NOTE — Progress Notes (Signed)
Occupational Therapy Session Note  Patient Details  Name: Sheri Pope MRN: [id number] Date of Birth: 19-Jan-1941  Today's Date: 03/28/2022 OT Individual Time: 8264-1583 OT Individual Time Calculation (min): 60 min    Short Term Goals: Week 1:  OT Short Term Goal 1 (Week 1): Pt will demonstrate improved balance to be able to pull pants over hips with CGA. OT Short Term Goal 2 (Week 1): Pt will don shoes with min A. OT Short Term Goal 3 (Week 1): Pt will be able to ambulate in kitchen with RW and retrieve items from shelves with CGA.  Skilled Therapeutic Interventions/Progress Updates:    Pt seen this session to practice tub bench transfers in tub room (set up same as home). Pt taken to room via wc. Demonstrated to pt how to do transfer. Pt repeat demonstrated walking in with RW and completed all steps with supervision.  Showed her curtain placement to avoid water leakage and recommended an adhesive shower head holder for easier reach.   Pt returned to room and practiced methods of how she can empty out her own [facility]. Placed 2 grocery bags with handles in container. Then added in paper towels for absorbtion.  Then poured water into bucket. Pt practiced lifting bucket out, removing bag and then placing in a cup holder attached to her RW so she could walk to the bathroom trashcan.  Found another option for pt. On [employer], she can order absorbent pads for the [facility] that will be easier to dispose off and avoid urine leaks through bag.    Pt agreed with that idea. She is looking forward to going home tomorrow. Pt resting in recliner with all needs met and alarm set.    Therapy Documentation Precautions:  Precautions Precautions: Fall Precaution Comments: R hemi weakness, severe RA Restrictions Weight Bearing Restrictions: No  Pain: Pain Assessment Pain Score: 0-No pain ADL: ADL Eating: Set up Grooming: Supervision/safety Where Assessed-Grooming: Standing at sink Upper Body Bathing:  Setup Where Assessed-Upper Body Bathing: Shower Lower Body Bathing: Supervision/safety Where Assessed-Lower Body Bathing: Shower Upper Body Dressing: Independent Where Assessed-Upper Body Dressing: Edge of bed Lower Body Dressing: Supervision/safety Where Assessed-Lower Body Dressing: Edge of bed Toileting: Modified independent Where Assessed-Toileting: Bedside Commode Toilet Transfer: Modified independent Toilet Transfer Method: Arts development officer: Engineer, technical sales Transfer: Close supervison Clinical cytogeneticist Method: Optometrist: Facilities manager: Close supervision Social research officer, government Method: Heritage manager: Radio broadcast assistant, Grab bars   Therapy/Group: Individual Therapy  [place] 03/28/2022, 12:25 PM

## 2022-03-28 NOTE — Progress Notes (Signed)
Physical Therapy Session Note  Patient Details  Name: Sheri Pope MRN: [id number] Date of Birth: 09-04-1940  Today's Date: 03/28/2022 PT Individual Time: 1302-1405 PT Individual Time Calculation (min): 63 min   Short Term Goals: Week 1:  PT Short Term Goal 1 (Week 1): STG = LTG d/t ELOS  Skilled Therapeutic Interventions/Progress Updates:  Patient seated in recliner with BLE elevated on entrance to room. Patient alert and agreeable to PT session.   Patient with no pain complaint at start of session.  Therapeutic Activity: Transfers: Pt performed sit<>stand,stand pivot, and toilet transfers throughout session with distant supervision/ Mod I. No cueing required for technique.  Car transfer performed with supervision/ Mod I. Rw storage will require +2 for assist d/t pt's advanced RA.   Gait Training:  Pt ambulated 155' x2 using RW with close supervision. Demonstrated flexed posture and increased pace. Provided vc/ tc for slowing pace and energy conservation.  Neuromuscular Re-ed: NMR facilitated during session with focus on standing balance. Pt guided in dynamic stepping exercise with touch of foot to colored disks placed on floor in arc around front of pt and outside of BOS. Colors called to pt and allowed to self select foot to step and reach. Pt performs well with good coordination of movements and maintaining balance except for  2 instances of light wobble in balance. Once instance in slow initiation.. NMR performed for improvements in motor control and coordination, balance, sequencing, judgement, and self confidence/ efficacy in performing all aspects of mobility at highest level of independence.   Therapeutic Exercise: Pt guided in continuous reciprocation of BLE only using NuStep L3 x 39mn with focus on maintaining steady and equal push with RLE compared to LLE. Pt's knee motion laterally has improved from first bout on NuStep. Additional 2 min with use of RLE for push/ pull  with AA from BLE as needed.   Patient seated upright in recliner with BLE elevated at end of session with brakes locked, belt alarm set, and all needs within reach. Pt ready to return home tomorrow.  Therapy Documentation Precautions:  Precautions Precautions: Fall Precaution Comments: R hemi weakness, severe RA Restrictions Weight Bearing Restrictions: No General:   Vital Signs:  Pain:  No pain related this session.   Therapy/Group: Individual Therapy  JAlger SimonsPT, DPT, CSRS 03/28/2022, 6:34 PM

## 2022-03-28 NOTE — Progress Notes (Signed)
Occupational Therapy Discharge Summary  Patient Details  Name: Sheri Pope MRN: [id number] Date of Birth: 12-15-1940  Date of Discharge from [place] service:March 28, 2022   Patient has met 11 of 11 long term goals due to improved activity tolerance, improved balance, ability to compensate for deficits, functional use of  RIGHT upper and RIGHT lower extremity, and improved coordination.  Patient to discharge at overall Supervision level with ambulation to bathroom, showering, homemaking activities, LB dressing. She is mod I to toilet and complete bed to [facility] transfers with mod I.  Patient's care partner is independent to provide the necessary physical assistance at discharge.    Reasons goals not met: n/a  Recommendation:  Patient will benefit from ongoing skilled OT services in home health setting to continue to advance functional skills in the area of BADL and iADL.  Equipment: No equipment provided  Reasons for discharge: treatment goals met  Patient/family agrees with progress made and goals achieved: Yes  OT Discharge Precautions/Restrictions   fall ADL ADL Eating: Set up Grooming: Supervision/safety Where Assessed-Grooming: Standing at sink Upper Body Bathing: Setup Where Assessed-Upper Body Bathing: Shower Lower Body Bathing: Supervision/safety Where Assessed-Lower Body Bathing: Shower Upper Body Dressing: Independent Where Assessed-Upper Body Dressing: Edge of bed Lower Body Dressing: Supervision/safety Where Assessed-Lower Body Dressing: Edge of bed Toileting: Modified independent Where Assessed-Toileting: Bedside Commode Toilet Transfer: Modified independent Toilet Transfer Method: Stand pivot Science writer: Engineer, technical sales Transfer: Close supervison Clinical cytogeneticist Method: Optometrist: Facilities manager: Close supervision Social research officer, government Method: Production assistant, radio: Radio broadcast assistant, Grab bars Vision Baseline Vision/History: 1 Wears glasses Patient Visual Report: No change from baseline Vision Assessment?: No apparent visual deficits Perception  Perception: Within Functional Limits Praxis Praxis: Intact Cognition Cognition Overall Cognitive Status: Within Functional Limits for tasks assessed Arousal/Alertness: Awake/alert Orientation Level: Person;Place;Situation Person: Oriented Place: Oriented Situation: Oriented Memory: Appears intact Awareness: Appears intact Problem Solving: Appears intact Safety/Judgment: Appears intact Brief Interview for Mental Status (BIMS) Repetition of Three Words (First Attempt): 3 Temporal Orientation: Year: Correct Temporal Orientation: Month: Accurate within 5 days Temporal Orientation: Day: Correct Recall: "Sock": Yes, no cue required Recall: "Blue": Yes, no cue required Recall: "Bed": Yes, no cue required BIMS Summary Score: 15 Sensation Sensation Light Touch: Appears Intact Hot/Cold: Appears Intact Proprioception: Appears Intact Stereognosis: Impaired by gross assessment Coordination Gross Motor Movements are Fluid and Coordinated: No Fine Motor Movements are Fluid and Coordinated: No Coordination and Movement Description: B hands severely deformed by RA. CVA has resulted in weakness in RUE and RLE impacting coordination. Motor  Motor Motor - Discharge Observations: RUE has now improved and has same functional strength as LUE Mobility    RW with supervision ambulation, mod I stand pivot transfers with RW Trunk/Postural Assessment  Postural Control Postural Control: Within Functional Limits  Balance Static Sitting Balance Static Sitting - Level of Assistance: 7: Independent Dynamic Sitting Balance Dynamic Sitting - Level of Assistance: 6: Modified independent (Device/Increase time) Static Standing Balance Static Standing - Level of Assistance: 6: Modified independent  (Device/Increase time) Dynamic Standing Balance Dynamic Standing - Level of Assistance: 5: Stand by assistance Extremity/Trunk Assessment RUE Assessment Active Range of Motion (AROM) Comments: WFL General Strength Comments: shoulder and elbow 4-/5,  grasp and hand function very impaired due to RA deformities LUE Assessment Active Range of Motion (AROM) Comments: WFL General Strength Comments: 4-/5,grasp and hand function very impaired due to RA deformities   Katti Pelle 03/28/2022,  11:17 AM

## 2022-03-29 NOTE — Plan of Care (Signed)
  Problem: RH Balance Goal: LTG Patient will maintain dynamic standing balance (PT) Description: LTG:  Patient will maintain dynamic standing balance with assistance during mobility activities (PT) Outcome: Completed/Met Flowsheets (Taken 03/21/2022 1737) LTG: Pt will maintain dynamic standing balance during mobility activities with:: Supervision/Verbal cueing   Problem: Sit to Stand Goal: LTG:  Patient will perform sit to stand with assistance level (PT) Description: LTG:  Patient will perform sit to stand with assistance level (PT) Outcome: Completed/Met Flowsheets (Taken 03/21/2022 1737) LTG: PT will perform sit to stand in preparation for functional mobility with assistance level: Independent with assistive device   Problem: RH Bed Mobility Goal: LTG Patient will perform bed mobility with assist (PT) Description: LTG: Patient will perform bed mobility with assistance, with/without cues (PT). Outcome: Completed/Met Flowsheets (Taken 03/28/2022 1821) LTG: Pt will perform bed mobility with assistance level of: Independent   Problem: RH Bed to Chair Transfers Goal: LTG Patient will perform bed/chair transfers w/assist (PT) Description: LTG: Patient will perform bed to chair transfers with assistance (PT). Outcome: Completed/Met Flowsheets (Taken 03/28/2022 1821) LTG: Pt will perform Bed to Chair Transfers with assistance level: Independent with assistive device    Problem: RH Car Transfers Goal: LTG Patient will perform car transfers with assist (PT) Description: LTG: Patient will perform car transfers with assistance (PT). Outcome: Completed/Met Flowsheets (Taken 03/21/2022 1737) LTG: Pt will perform car transfers with assist:: Supervision/Verbal cueing   Problem: RH Furniture Transfers Goal: LTG Patient will perform furniture transfers w/assist (OT/PT) Description: LTG: Patient will perform furniture transfers  with assistance (OT/PT). Outcome: Completed/Met Flowsheets (Taken  03/21/2022 1737) LTG: Pt will perform furniture transfers with assist:: Supervision/Verbal cueing   Problem: RH Ambulation Goal: LTG Patient will ambulate in home environment (PT) Description: LTG: Patient will ambulate in home environment, # of feet with assistance (PT). Outcome: Completed/Met Flowsheets (Taken 03/21/2022 1737) LTG: Pt will ambulate in home environ  assist needed:: Supervision/Verbal cueing LTG: Ambulation distance in home environment: at least 50 ft with use of LRAD Goal: LTG Patient will ambulate in community environment (PT) Description: LTG: Patient will ambulate in community environment, # of feet with assistance (PT). Outcome: Completed/Met Flowsheets (Taken 03/21/2022 1737) LTG: Pt will ambulate in community environ  assist needed:: Contact Guard/Touching assist LTG: Ambulation distance in community environment: more than 200 ft using LRAD   Problem: RH Stairs Goal: LTG Patient will ambulate up and down stairs w/assist (PT) Description: LTG: Patient will ambulate up and down # of stairs with assistance (PT) Outcome: Completed/Met Flowsheets (Taken 03/21/2022 1737) LTG: Pt will ambulate up/down stairs assist needed:: Supervision/Verbal cueing LTG: Pt will  ambulate up and down number of stairs: at least 2 steps using R or L HR as per home environment

## 2022-03-29 NOTE — Progress Notes (Signed)
PROGRESS NOTE   Subjective/Complaints:   Discussed d/c process , no new c/os shoulder pain on left persist (arthritis flare)   ROS: Patient denies CP, SOB, N/V/D  Objective:   No results found. No results for input(s): "WBC", "HGB", "HCT", "PLT" in the last 72 hours.  No results for input(s): "NA", "K", "CL", "CO2", "GLUCOSE", "BUN", "CREATININE", "CALCIUM" in the last 72 hours.   Intake/Output Summary (Last 24 hours) at 03/29/2022 0832 Last data filed at 03/28/2022 1851 Gross per 24 hour  Intake 480 ml  Output --  Net 480 ml         Physical Exam: Vital Signs Blood pressure (!) 155/79, pulse 86, temperature 97.8 F (36.6 C), temperature source Oral, resp. rate 16, height '5\' 1"'$  (1.549 m), weight 44 kg, SpO2 94 %.    General: No acute distress Mood and affect are appropriate Heart: Regular rate and rhythm no rubs murmurs or extra sounds Lungs: Clear to auscultation, breathing unlabored, no rales or wheezes Abdomen: Positive bowel sounds, soft nontender to palpation, nondistended Extremities: No clubbing, cyanosis, or edema   Neurologic: Cranial nerves II through XII intact, motor strength is 4/5 in bilateral deltoid, bicep, tricep, grip, hip flexor, knee extensors, ankle dorsiflexor and plantar flexor Sensory exam normal sensation to light touch and proprioception in bilateral upper and lower extremities Cerebellar exam normal finger to nose to finger as well as heel to shin in bilateral upper and lower extremities Musculoskeletal: Rheumatoid joint deformities in both hands with ulnar deviation at the MCPs and PIPs.  Feet show no significant joint deformities.  Knees have full range of motion.  Healed incision from prior TKR on the left side. Shoulder range of motion is within functional limits.  Elbow range of motion within functional limits Rheumatoid nodules on bilateral Achilles tendons as well as TA tendons  also on ulnar aspect of L elbow  Assessment/Plan: 1. Functional deficits due to CVA/RA Stable for D/C today F/u PCP in 3-4 weeks F/u PM&R 2 weeks See D/C summary See D/C instructions   Care Tool:  Bathing    Body parts bathed by patient: Right arm, Left arm, Chest, Abdomen, Front perineal area, Buttocks, Right upper leg, Left upper leg, Face, Right lower leg, Left lower leg   Body parts bathed by helper: Right lower leg, Left lower leg     Bathing assist Assist Level: Supervision/Verbal cueing     Upper Body Dressing/Undressing Upper body dressing   What is the patient wearing?: Pull over shirt    Upper body assist Assist Level: Independent    Lower Body Dressing/Undressing Lower body dressing      What is the patient wearing?: Pants, Underwear/pull up     Lower body assist Assist for lower body dressing: Supervision/Verbal cueing     Toileting Toileting    Toileting assist Assist for toileting: Independent with assistive device     Transfers Chair/bed transfer  Transfers assist     Chair/bed transfer assist level: Independent with assistive device Chair/bed transfer assistive device: Programmer, multimedia   Ambulation assist      Assist level: Supervision/Verbal cueing Assistive device: Walker-rolling Max distance: 200 ft  Walk 10 feet activity   Assist     Assist level: Supervision/Verbal cueing Assistive device: Walker-rolling   Walk 50 feet activity   Assist    Assist level: Supervision/Verbal cueing Assistive device: Walker-rolling    Walk 150 feet activity   Assist Walk 150 feet activity did not occur: Safety/medical concerns  Assist level: Supervision/Verbal cueing Assistive device: Walker-rolling    Walk 10 feet on uneven surface  activity   Assist Walk 10 feet on uneven surfaces activity did not occur: Safety/medical concerns   Assist level: Supervision/Verbal cueing Assistive device: Walker-rolling    Wheelchair     Assist Is the patient using a wheelchair?: Yes Type of Wheelchair: Manual    Wheelchair assist level: Supervision/Verbal cueing Max wheelchair distance: 50 ft    Wheelchair 50 feet with 2 turns activity    Assist        Assist Level: Supervision/Verbal cueing   Wheelchair 150 feet activity     Assist      Assist Level: Total Assistance - Patient < 25%, Maximal Assistance - Patient 25 - 49%   Blood pressure (!) 155/79, pulse 86, temperature 97.8 F (36.6 C), temperature source Oral, resp. rate 16, height '5\' 1"'$  (1.549 m), weight 44 kg, SpO2 94 %.  Medical Problem List and Plan: 1. 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 Very active despite jt disease            d/c today - has failed numerous DMARDs no longer sees rheumatology    2.  Antithrombotics: -DVT/anticoagulation:  Mechanical: Antiembolism stockings, thigh (TED hose) Bilateral lower extremities             -antiplatelet therapy: Aspirin 81 mg daily and Plavix 75 mg daily x three weeks then aspirin alone/ Initiated 03/14/2022  3. Pain Management: Hydrocodone as needed, takes 1/2  (2.'5mg'$ ) BID prescribed by PCP Dr Nevada Crane.  , Robaxin 500 mg twice daily 4. Mood/Behavior/Sleep: Xanax 0.25 mg nightly for sleep.  Provide emotional support             -antipsychotic agents: N/A 5. Neuropsych/cognition: This patient is capable of making decisions on her own behalf. 6. Skin/Wound Care: Routine skin checks 7. Fluids/Electrolytes/Nutrition: Routine in and outs with follow-up chemistries 8.  Hyperlipidemia.  Lipitor 9.  Severe/advanced rheumatoid arthritis with multiple joint replacements.  Follow-up outpatient.             -Flare up of left shoulder pain, order Kpad and aspercreme 10.  History of tobacco use.  NicoDerm patch.  Provide counseling 11.  History of leukemia.  Follow-up outpatient Dr.Katragadda 12.  Chronic  hyponatremia related to history of leukemia/SIADH.  Continue fluid restriction.             -f/u labs on admission 13.  Large thyroid gland.  Incidental finding of heterogeneous enlarged thyroid gland on CT angio head and neck.  Thyroid ultrasound consistent with goiter, no suspicious nodule seen.  Follow-up outpatient  TSH mildly elevated but normal T4 f/u PCP  14.  Mild HypoK KCL 8mq x 1 - recheck K+ is normal     Latest Ref Rng & Units 03/24/2022    6:02 AM 03/21/2022    7:57 AM 03/17/2022    3:29 AM  BMP  Glucose 70 - 99 mg/dL 96  142    BUN 8 - 23 mg/dL 10  12  Creatinine 0.44 - 1.00 mg/dL 0.60  0.72    Sodium 135 - 145 mmol/L 131  132    Potassium 3.5 - 5.1 mmol/L 3.9  3.4  3.7   Chloride 98 - 111 mmol/L 98  99    CO2 22 - 32 mmol/L 23  26    Calcium 8.9 - 10.3 mg/dL 9.2  9.0     15. Dysuria and frequency- 80k GNR -1/27 began empiric keflex '500mg'$  bid UCx pansensitive e coli -(type 4 stool) hold stool softeners while on abx  16.  Mild systolic HTN, f/u with PCP - was not taking meds at home PTA  LOS: 9 days A FACE TO FACE EVALUATION WAS PERFORMED  Charlett Blake 03/29/2022, 8:32 AM

## 2022-03-29 NOTE — Progress Notes (Signed)
Patient ID: Sheri Pope, female   DOB: 06-Oct-1940, 82 y.o.   MRN: [id number]  SW met with patient to address d/c questions or concerns. DME in room. HH established with [facility]. Niece will be present to pick up patient for d/c today.

## 2022-03-31 DIAGNOSIS — N390 Urinary tract infection, site not specified: Secondary | ICD-10-CM | POA: Diagnosis not present

## 2022-03-31 DIAGNOSIS — E785 Hyperlipidemia, unspecified: Secondary | ICD-10-CM | POA: Diagnosis not present

## 2022-03-31 DIAGNOSIS — I69351 Hemiplegia and hemiparesis following cerebral infarction affecting right dominant side: Secondary | ICD-10-CM | POA: Diagnosis not present

## 2022-03-31 DIAGNOSIS — M069 Rheumatoid arthritis, unspecified: Secondary | ICD-10-CM | POA: Diagnosis not present

## 2022-03-31 DIAGNOSIS — E871 Hypo-osmolality and hyponatremia: Secondary | ICD-10-CM | POA: Diagnosis not present

## 2022-03-31 DIAGNOSIS — E049 Nontoxic goiter, unspecified: Secondary | ICD-10-CM | POA: Diagnosis not present

## 2022-03-31 DIAGNOSIS — I10 Essential (primary) hypertension: Secondary | ICD-10-CM | POA: Diagnosis not present

## 2022-03-31 DIAGNOSIS — F419 Anxiety disorder, unspecified: Secondary | ICD-10-CM | POA: Diagnosis not present

## 2022-03-31 DIAGNOSIS — Z720 Tobacco use: Secondary | ICD-10-CM | POA: Diagnosis not present

## 2022-04-05 DIAGNOSIS — E049 Nontoxic goiter, unspecified: Secondary | ICD-10-CM | POA: Diagnosis not present

## 2022-04-05 DIAGNOSIS — E785 Hyperlipidemia, unspecified: Secondary | ICD-10-CM | POA: Diagnosis not present

## 2022-04-05 DIAGNOSIS — Z720 Tobacco use: Secondary | ICD-10-CM | POA: Diagnosis not present

## 2022-04-05 DIAGNOSIS — I10 Essential (primary) hypertension: Secondary | ICD-10-CM | POA: Diagnosis not present

## 2022-04-05 DIAGNOSIS — E871 Hypo-osmolality and hyponatremia: Secondary | ICD-10-CM | POA: Diagnosis not present

## 2022-04-05 DIAGNOSIS — N390 Urinary tract infection, site not specified: Secondary | ICD-10-CM | POA: Diagnosis not present

## 2022-04-05 DIAGNOSIS — M069 Rheumatoid arthritis, unspecified: Secondary | ICD-10-CM | POA: Diagnosis not present

## 2022-04-05 DIAGNOSIS — F419 Anxiety disorder, unspecified: Secondary | ICD-10-CM | POA: Diagnosis not present

## 2022-04-05 DIAGNOSIS — I69351 Hemiplegia and hemiparesis following cerebral infarction affecting right dominant side: Secondary | ICD-10-CM | POA: Diagnosis not present

## 2022-04-06 ENCOUNTER — Encounter: Payer: Medicare HMO | Admitting: Physical Medicine & Rehabilitation

## 2022-04-07 DIAGNOSIS — I10 Essential (primary) hypertension: Secondary | ICD-10-CM | POA: Diagnosis not present

## 2022-04-07 DIAGNOSIS — M069 Rheumatoid arthritis, unspecified: Secondary | ICD-10-CM | POA: Diagnosis not present

## 2022-04-07 DIAGNOSIS — N390 Urinary tract infection, site not specified: Secondary | ICD-10-CM | POA: Diagnosis not present

## 2022-04-07 DIAGNOSIS — Z720 Tobacco use: Secondary | ICD-10-CM | POA: Diagnosis not present

## 2022-04-07 DIAGNOSIS — E049 Nontoxic goiter, unspecified: Secondary | ICD-10-CM | POA: Diagnosis not present

## 2022-04-07 DIAGNOSIS — E785 Hyperlipidemia, unspecified: Secondary | ICD-10-CM | POA: Diagnosis not present

## 2022-04-07 DIAGNOSIS — F419 Anxiety disorder, unspecified: Secondary | ICD-10-CM | POA: Diagnosis not present

## 2022-04-07 DIAGNOSIS — I69351 Hemiplegia and hemiparesis following cerebral infarction affecting right dominant side: Secondary | ICD-10-CM | POA: Diagnosis not present

## 2022-04-07 DIAGNOSIS — E871 Hypo-osmolality and hyponatremia: Secondary | ICD-10-CM | POA: Diagnosis not present

## 2022-04-10 DIAGNOSIS — Z720 Tobacco use: Secondary | ICD-10-CM | POA: Diagnosis not present

## 2022-04-10 DIAGNOSIS — I69351 Hemiplegia and hemiparesis following cerebral infarction affecting right dominant side: Secondary | ICD-10-CM | POA: Diagnosis not present

## 2022-04-10 DIAGNOSIS — I10 Essential (primary) hypertension: Secondary | ICD-10-CM | POA: Diagnosis not present

## 2022-04-10 DIAGNOSIS — E785 Hyperlipidemia, unspecified: Secondary | ICD-10-CM | POA: Diagnosis not present

## 2022-04-10 DIAGNOSIS — M069 Rheumatoid arthritis, unspecified: Secondary | ICD-10-CM | POA: Diagnosis not present

## 2022-04-10 DIAGNOSIS — E871 Hypo-osmolality and hyponatremia: Secondary | ICD-10-CM | POA: Diagnosis not present

## 2022-04-10 DIAGNOSIS — F419 Anxiety disorder, unspecified: Secondary | ICD-10-CM | POA: Diagnosis not present

## 2022-04-10 DIAGNOSIS — N390 Urinary tract infection, site not specified: Secondary | ICD-10-CM | POA: Diagnosis not present

## 2022-04-10 DIAGNOSIS — E049 Nontoxic goiter, unspecified: Secondary | ICD-10-CM | POA: Diagnosis not present

## 2022-04-12 ENCOUNTER — Encounter: Payer: Medicare HMO | Attending: Physical Medicine & Rehabilitation | Admitting: Registered Nurse

## 2022-04-12 ENCOUNTER — Encounter: Payer: Self-pay | Admitting: Registered Nurse

## 2022-04-12 VITALS — BP 147/76 | HR 89 | Ht 61.0 in | Wt 96.0 lb

## 2022-04-12 DIAGNOSIS — M069 Rheumatoid arthritis, unspecified: Secondary | ICD-10-CM | POA: Diagnosis not present

## 2022-04-12 DIAGNOSIS — I6381 Other cerebral infarction due to occlusion or stenosis of small artery: Secondary | ICD-10-CM | POA: Diagnosis not present

## 2022-04-12 NOTE — Progress Notes (Signed)
Subjective:    Patient ID: Sheri Pope, female    DOB: October 05, 1940, 82 y.o.   MRN: [id number]  HPI: Sheri Pope is a 82 y.o. female who is here for  F/U of  her Left Thalamic Infarction and Rheumatoid Arthritis she presented to [facility] on 03/13/2022 with acute onset  of right sided weakness. Dr. Josephine Cables H&P: 03/13/2022  HPI: Sheri Pope is a 82 y.o. female with medical history significant of hyperlipidemia, severe/advanced rheumatoid arthritis, osteoarthritis and history of leukemia who presents to the emergency department due to right-sided weakness which started earlier today around 11 AM, EMS was activated, blood sugar was noted to be high in the 200 range and patient refused transport to the ED at that time.  In the afternoon/evening, symptoms worsened with increased difficulty in being able to ambulate and increased tendency towards falling.  Right arm and leg felt weak and she felt like she was dragging her leg a little while trying to walk.  Patient denies slurred speech, facial droop, chest pain, shortness of breath, cough, nausea, vomiting or abdominal pain.  CT Head WO Contrast:  IMPRESSION: 1. No CT evidence for acute intracranial abnormality. 2. Mild chronic small vessel ischemic changes of the white matter.  CT Angio:  IMPRESSION: 1. No emergent large vessel occlusion or hemodynamically significant stenosis of the head or neck. 2. Mild bilateral carotid bifurcation atherosclerosis without hemodynamically significant stenosis. 3. Mildly enlarged and heterogeneous thyroid gland. Incidental heterogeneous and enlarged thyroid. Recommend non-emergent thyroid ultrasound. Reference: J Am Coll Radiol. 2015 Feb;12(2): 143-50   Aortic Atherosclerosis (ICD10-I70.0) and Emphysema (ICD10-J43.9).   MR Brain: WO Contrast: IMPRESSION: 1. Acute lacunar infarct at the left thalamocapsular junction. 2. Chronic small vessel ischemia including chronic lacunar  infarcts at the left thalamus.  Sheri Pope was admitted to inpatient rehabilitation on 03/20/2022 and discharged home on 03/29/2022. She is receiving Home Health Therapy from [facility]. She reports generalized joint pain and has a good appetite.   Sheri Pope niece in room.   Pain Inventory Average Pain 6 Pain Right Now 8 My pain is dull and aching  LOCATION OF PAIN  Shoulder, Elbow, Groin, Thigh, Knee  BOWEL Number of stools per week: 7  BLADDER Pads   Frequent urination Yes    Mobility walk with assistance use a walker ability to climb steps?  yes do you drive?  no Do you have any goals in this area?  yes  Function retired I need assistance with the following:  bathing  Neuro/Psych weakness trouble walking  Prior Studies Any changes since last visit?  no  Physicians involved in your care Any changes since last visit?  no   Family History  Problem Relation Age of Onset   Diabetes Sister    Macular degeneration Sister    Heart attack Mother    Stroke Father    Dementia Father    Cancer Brother    Healthy Sister    Healthy Sister    Diabetes Brother    Social History   Socioeconomic History   Marital status: Widowed    Spouse name: Not on file   Number of children: Not on file   Years of education: Not on file   Highest education level: Not on file  Occupational History   Occupation: retired  Tobacco Use   Smoking status: Every Day    Packs/day: 1.00    Years: 50.00    Total pack years: 50.00  Types: Cigarettes   Smokeless tobacco: Never  Vaping Use   Vaping Use: Never used  Substance and Sexual Activity   Alcohol use: No   Drug use: No   Sexual activity: Not Currently    Birth control/protection: Surgical  Other Topics Concern   Not on file  Social History Narrative   Not on file   Social Determinants of Health   Financial Resource Strain: Not on file  Food Insecurity: No Food Insecurity (03/14/2022)   Hunger Vital  Sign    Worried About Running Out of Food in the Last Year: Never true    Ran Out of Food in the Last Year: Never true  Transportation Needs: No Transportation Needs (03/14/2022)   PRAPARE - Hydrologist (Medical): No    Lack of Transportation (Non-Medical): No  Physical Activity: Not on file  Stress: Not on file  Social Connections: Not on file   Past Surgical History:  Procedure Laterality Date   ABDOMINAL HYSTERECTOMY  1870'S   BREAST SURGERY     BREAST BIOSPIES   CATARACT EXTRACTION W/PHACO Left 05/30/2012   Procedure: CATARACT EXTRACTION PHACO AND INTRAOCULAR LENS PLACEMENT (IOC);  Surgeon: Tonny Branch, MD;  Location: [facility];  Service: Ophthalmology;  Laterality: Left;  CDE: 16.99   CATARACT EXTRACTION W/PHACO Right 06/24/2012   Procedure: CATARACT EXTRACTION PHACO AND INTRAOCULAR LENS PLACEMENT (IOC);  Surgeon: Tonny Branch, MD;  Location: [facility];  Service: Ophthalmology;  Laterality: Right;  CDE:17.92   COLONOSCOPY WITH PROPOFOL N/A 10/14/2020   Procedure: COLONOSCOPY WITH PROPOFOL;  Surgeon: Daneil Dolin, MD;  [facility]: [facility];  Service: Endoscopy;  Laterality: N/A;  ASA III / 12:30   ESOPHAGOGASTRODUODENOSCOPY (EGD) WITH PROPOFOL N/A 10/14/2020   Procedure: ESOPHAGOGASTRODUODENOSCOPY (EGD) WITH PROPOFOL;  Surgeon: Daneil Dolin, MD;  [facility];  Service: Endoscopy;  Laterality: N/A;   HIP SURGERY     JOINT REPLACEMENT  2007   RIGHT HIP REPLACEMENT   LEFT KNEE ARTHROSCOPY  AUG 2012   MALONEY DILATION  10/14/2020   Procedure: MALONEY DILATION;  Surgeon: Daneil Dolin, MD;  [facility]: [facility];  Service: Endoscopy;;   MULTIPLE ORTHOPEDIC SURGERIES     ON BOTH HANDS AND BOTH FEET, RIGHT ELBOW   SPLEENECTOMY  IN THE 70'S   FOR LARGE CYST   TOTAL KNEE ARTHROPLASTY  11/10/2011   Procedure: TOTAL KNEE ARTHROPLASTY;  Surgeon: Mcarthur Rossetti, MD;  Location: [facility];  Service: Orthopedics;  Laterality: Left;  Left Total Knee  Arthroplasty   YAG LASER APPLICATION Bilateral    Dr. Geoffry Paradise   Past Medical History:  Diagnosis Date   Arthritis    RA AND OA --PAIN AND SWELLIN IN LEFT KNEE   Blood in urine    NEGATIVE UROLOGY WORK UP --BUT ALWAYS HAS BLOOD IN URINE   GERD (gastroesophageal reflux disease)    H/O hiatal hernia    Hyperlipidemia    Leukemia (HCC)    PONV (postoperative nausea and vomiting)    ALWAYS SICK AFTER SURGERIES EXCEPT AFTER HIP REPLACMENT 2007   Vitamin D deficiency    Ht 5' 1"$  (1.549 m)   Wt 96 lb (43.5 kg)   BMI 18.14 kg/m   Opioid Risk Score:   Fall Risk Score:  `1  Depression screen PHQ 2/9      No data to display            Review of Systems  Musculoskeletal:  Pain in Shoulder, Elbow, Groin, Thigh, Knee  All other systems reviewed and are negative.     Objective:   Physical Exam Vitals and nursing note reviewed.  Constitutional:      Appearance: Normal appearance.  Cardiovascular:     Rate and Rhythm: Normal rate and regular rhythm.     Pulses: Normal pulses.     Heart sounds: Normal heart sounds.  Pulmonary:     Effort: Pulmonary effort is normal.     Breath sounds: Normal breath sounds.  Musculoskeletal:     Cervical back: Normal range of motion and neck supple.     Comments: Normal Muscle Bulk and Muscle Testing Reveals:  Upper Extremities: Full ROM and Muscle Strength 5/5 Lower Extremities: Full ROM and Muscle Strength 5/5 Severe rheumatoid changes to hands and feet with deformities most prominent in the hands. Multiple rheumatoid nodules on all 4 limbs.   Arises from Table slowly using walker for support Narrow Based  Gait            Skin:    General: Skin is warm and dry.  Neurological:     Mental Status: She is alert and oriented to person, place, and time.  Psychiatric:        Mood and Affect: Mood normal.        Behavior: Behavior normal.          Assessment & Plan:  Left Thalamic Infarction: She has a scheduled appointment  with Dr Leonie Man on 05/31/2022. Continue with Home Health with  [facility]. Continue to Monitor.   Rheumatoid Arthritis : Continue current medication regimen. Continue to monitor.   F/U with Dr. Letta Pate in 4- 6 weeks

## 2022-04-13 DIAGNOSIS — E559 Vitamin D deficiency, unspecified: Secondary | ICD-10-CM | POA: Diagnosis not present

## 2022-04-13 DIAGNOSIS — E049 Nontoxic goiter, unspecified: Secondary | ICD-10-CM | POA: Diagnosis not present

## 2022-04-13 DIAGNOSIS — E785 Hyperlipidemia, unspecified: Secondary | ICD-10-CM | POA: Diagnosis not present

## 2022-04-13 DIAGNOSIS — M069 Rheumatoid arthritis, unspecified: Secondary | ICD-10-CM | POA: Diagnosis not present

## 2022-04-13 DIAGNOSIS — E871 Hypo-osmolality and hyponatremia: Secondary | ICD-10-CM | POA: Diagnosis not present

## 2022-04-13 DIAGNOSIS — I700 Atherosclerosis of aorta: Secondary | ICD-10-CM | POA: Diagnosis not present

## 2022-04-13 DIAGNOSIS — E440 Moderate protein-calorie malnutrition: Secondary | ICD-10-CM | POA: Diagnosis not present

## 2022-04-13 DIAGNOSIS — I10 Essential (primary) hypertension: Secondary | ICD-10-CM | POA: Diagnosis not present

## 2022-04-13 DIAGNOSIS — F411 Generalized anxiety disorder: Secondary | ICD-10-CM | POA: Diagnosis not present

## 2022-04-13 DIAGNOSIS — I63412 Cerebral infarction due to embolism of left middle cerebral artery: Secondary | ICD-10-CM | POA: Diagnosis not present

## 2022-04-13 DIAGNOSIS — I69353 Hemiplegia and hemiparesis following cerebral infarction affecting right non-dominant side: Secondary | ICD-10-CM | POA: Diagnosis not present

## 2022-04-14 DIAGNOSIS — E049 Nontoxic goiter, unspecified: Secondary | ICD-10-CM | POA: Diagnosis not present

## 2022-04-14 DIAGNOSIS — E785 Hyperlipidemia, unspecified: Secondary | ICD-10-CM | POA: Diagnosis not present

## 2022-04-14 DIAGNOSIS — I69351 Hemiplegia and hemiparesis following cerebral infarction affecting right dominant side: Secondary | ICD-10-CM | POA: Diagnosis not present

## 2022-04-14 DIAGNOSIS — F419 Anxiety disorder, unspecified: Secondary | ICD-10-CM | POA: Diagnosis not present

## 2022-04-14 DIAGNOSIS — E871 Hypo-osmolality and hyponatremia: Secondary | ICD-10-CM | POA: Diagnosis not present

## 2022-04-14 DIAGNOSIS — I10 Essential (primary) hypertension: Secondary | ICD-10-CM | POA: Diagnosis not present

## 2022-04-14 DIAGNOSIS — N390 Urinary tract infection, site not specified: Secondary | ICD-10-CM | POA: Diagnosis not present

## 2022-04-14 DIAGNOSIS — M069 Rheumatoid arthritis, unspecified: Secondary | ICD-10-CM | POA: Diagnosis not present

## 2022-04-14 DIAGNOSIS — Z720 Tobacco use: Secondary | ICD-10-CM | POA: Diagnosis not present

## 2022-04-19 DIAGNOSIS — E049 Nontoxic goiter, unspecified: Secondary | ICD-10-CM | POA: Diagnosis not present

## 2022-04-19 DIAGNOSIS — M069 Rheumatoid arthritis, unspecified: Secondary | ICD-10-CM | POA: Diagnosis not present

## 2022-04-19 DIAGNOSIS — I69351 Hemiplegia and hemiparesis following cerebral infarction affecting right dominant side: Secondary | ICD-10-CM | POA: Diagnosis not present

## 2022-04-19 DIAGNOSIS — Z720 Tobacco use: Secondary | ICD-10-CM | POA: Diagnosis not present

## 2022-04-19 DIAGNOSIS — E871 Hypo-osmolality and hyponatremia: Secondary | ICD-10-CM | POA: Diagnosis not present

## 2022-04-19 DIAGNOSIS — E785 Hyperlipidemia, unspecified: Secondary | ICD-10-CM | POA: Diagnosis not present

## 2022-04-19 DIAGNOSIS — I10 Essential (primary) hypertension: Secondary | ICD-10-CM | POA: Diagnosis not present

## 2022-04-19 DIAGNOSIS — F419 Anxiety disorder, unspecified: Secondary | ICD-10-CM | POA: Diagnosis not present

## 2022-04-19 DIAGNOSIS — N390 Urinary tract infection, site not specified: Secondary | ICD-10-CM | POA: Diagnosis not present

## 2022-04-20 DIAGNOSIS — I10 Essential (primary) hypertension: Secondary | ICD-10-CM | POA: Diagnosis not present

## 2022-04-20 DIAGNOSIS — E785 Hyperlipidemia, unspecified: Secondary | ICD-10-CM | POA: Diagnosis not present

## 2022-04-20 DIAGNOSIS — N390 Urinary tract infection, site not specified: Secondary | ICD-10-CM | POA: Diagnosis not present

## 2022-04-20 DIAGNOSIS — I69351 Hemiplegia and hemiparesis following cerebral infarction affecting right dominant side: Secondary | ICD-10-CM | POA: Diagnosis not present

## 2022-04-20 DIAGNOSIS — Z720 Tobacco use: Secondary | ICD-10-CM | POA: Diagnosis not present

## 2022-04-20 DIAGNOSIS — E049 Nontoxic goiter, unspecified: Secondary | ICD-10-CM | POA: Diagnosis not present

## 2022-04-20 DIAGNOSIS — M069 Rheumatoid arthritis, unspecified: Secondary | ICD-10-CM | POA: Diagnosis not present

## 2022-04-20 DIAGNOSIS — F419 Anxiety disorder, unspecified: Secondary | ICD-10-CM | POA: Diagnosis not present

## 2022-04-20 DIAGNOSIS — E871 Hypo-osmolality and hyponatremia: Secondary | ICD-10-CM | POA: Diagnosis not present

## 2022-04-24 DIAGNOSIS — F419 Anxiety disorder, unspecified: Secondary | ICD-10-CM | POA: Diagnosis not present

## 2022-04-24 DIAGNOSIS — E049 Nontoxic goiter, unspecified: Secondary | ICD-10-CM | POA: Diagnosis not present

## 2022-04-24 DIAGNOSIS — I69351 Hemiplegia and hemiparesis following cerebral infarction affecting right dominant side: Secondary | ICD-10-CM | POA: Diagnosis not present

## 2022-04-24 DIAGNOSIS — I10 Essential (primary) hypertension: Secondary | ICD-10-CM | POA: Diagnosis not present

## 2022-04-24 DIAGNOSIS — M069 Rheumatoid arthritis, unspecified: Secondary | ICD-10-CM | POA: Diagnosis not present

## 2022-04-24 DIAGNOSIS — E785 Hyperlipidemia, unspecified: Secondary | ICD-10-CM | POA: Diagnosis not present

## 2022-04-24 DIAGNOSIS — Z720 Tobacco use: Secondary | ICD-10-CM | POA: Diagnosis not present

## 2022-04-24 DIAGNOSIS — E871 Hypo-osmolality and hyponatremia: Secondary | ICD-10-CM | POA: Diagnosis not present

## 2022-04-24 DIAGNOSIS — N390 Urinary tract infection, site not specified: Secondary | ICD-10-CM | POA: Diagnosis not present

## 2022-04-26 DIAGNOSIS — E049 Nontoxic goiter, unspecified: Secondary | ICD-10-CM | POA: Diagnosis not present

## 2022-04-26 DIAGNOSIS — I10 Essential (primary) hypertension: Secondary | ICD-10-CM | POA: Diagnosis not present

## 2022-04-26 DIAGNOSIS — E785 Hyperlipidemia, unspecified: Secondary | ICD-10-CM | POA: Diagnosis not present

## 2022-04-26 DIAGNOSIS — I69351 Hemiplegia and hemiparesis following cerebral infarction affecting right dominant side: Secondary | ICD-10-CM | POA: Diagnosis not present

## 2022-04-26 DIAGNOSIS — M069 Rheumatoid arthritis, unspecified: Secondary | ICD-10-CM | POA: Diagnosis not present

## 2022-04-26 DIAGNOSIS — N390 Urinary tract infection, site not specified: Secondary | ICD-10-CM | POA: Diagnosis not present

## 2022-04-26 DIAGNOSIS — F419 Anxiety disorder, unspecified: Secondary | ICD-10-CM | POA: Diagnosis not present

## 2022-04-26 DIAGNOSIS — E871 Hypo-osmolality and hyponatremia: Secondary | ICD-10-CM | POA: Diagnosis not present

## 2022-04-26 DIAGNOSIS — Z720 Tobacco use: Secondary | ICD-10-CM | POA: Diagnosis not present

## 2022-04-30 DIAGNOSIS — N390 Urinary tract infection, site not specified: Secondary | ICD-10-CM | POA: Diagnosis not present

## 2022-04-30 DIAGNOSIS — E049 Nontoxic goiter, unspecified: Secondary | ICD-10-CM | POA: Diagnosis not present

## 2022-04-30 DIAGNOSIS — E871 Hypo-osmolality and hyponatremia: Secondary | ICD-10-CM | POA: Diagnosis not present

## 2022-04-30 DIAGNOSIS — F419 Anxiety disorder, unspecified: Secondary | ICD-10-CM | POA: Diagnosis not present

## 2022-04-30 DIAGNOSIS — I69351 Hemiplegia and hemiparesis following cerebral infarction affecting right dominant side: Secondary | ICD-10-CM | POA: Diagnosis not present

## 2022-04-30 DIAGNOSIS — M069 Rheumatoid arthritis, unspecified: Secondary | ICD-10-CM | POA: Diagnosis not present

## 2022-04-30 DIAGNOSIS — E785 Hyperlipidemia, unspecified: Secondary | ICD-10-CM | POA: Diagnosis not present

## 2022-04-30 DIAGNOSIS — Z720 Tobacco use: Secondary | ICD-10-CM | POA: Diagnosis not present

## 2022-04-30 DIAGNOSIS — I10 Essential (primary) hypertension: Secondary | ICD-10-CM | POA: Diagnosis not present

## 2022-05-01 DIAGNOSIS — F419 Anxiety disorder, unspecified: Secondary | ICD-10-CM | POA: Diagnosis not present

## 2022-05-01 DIAGNOSIS — M069 Rheumatoid arthritis, unspecified: Secondary | ICD-10-CM | POA: Diagnosis not present

## 2022-05-01 DIAGNOSIS — E049 Nontoxic goiter, unspecified: Secondary | ICD-10-CM | POA: Diagnosis not present

## 2022-05-01 DIAGNOSIS — N390 Urinary tract infection, site not specified: Secondary | ICD-10-CM | POA: Diagnosis not present

## 2022-05-01 DIAGNOSIS — E871 Hypo-osmolality and hyponatremia: Secondary | ICD-10-CM | POA: Diagnosis not present

## 2022-05-01 DIAGNOSIS — I10 Essential (primary) hypertension: Secondary | ICD-10-CM | POA: Diagnosis not present

## 2022-05-01 DIAGNOSIS — E785 Hyperlipidemia, unspecified: Secondary | ICD-10-CM | POA: Diagnosis not present

## 2022-05-01 DIAGNOSIS — Z720 Tobacco use: Secondary | ICD-10-CM | POA: Diagnosis not present

## 2022-05-01 DIAGNOSIS — I69351 Hemiplegia and hemiparesis following cerebral infarction affecting right dominant side: Secondary | ICD-10-CM | POA: Diagnosis not present

## 2022-05-04 DIAGNOSIS — I69351 Hemiplegia and hemiparesis following cerebral infarction affecting right dominant side: Secondary | ICD-10-CM | POA: Diagnosis not present

## 2022-05-04 DIAGNOSIS — E785 Hyperlipidemia, unspecified: Secondary | ICD-10-CM | POA: Diagnosis not present

## 2022-05-04 DIAGNOSIS — Z720 Tobacco use: Secondary | ICD-10-CM | POA: Diagnosis not present

## 2022-05-04 DIAGNOSIS — N390 Urinary tract infection, site not specified: Secondary | ICD-10-CM | POA: Diagnosis not present

## 2022-05-04 DIAGNOSIS — F419 Anxiety disorder, unspecified: Secondary | ICD-10-CM | POA: Diagnosis not present

## 2022-05-04 DIAGNOSIS — E871 Hypo-osmolality and hyponatremia: Secondary | ICD-10-CM | POA: Diagnosis not present

## 2022-05-04 DIAGNOSIS — M069 Rheumatoid arthritis, unspecified: Secondary | ICD-10-CM | POA: Diagnosis not present

## 2022-05-04 DIAGNOSIS — E049 Nontoxic goiter, unspecified: Secondary | ICD-10-CM | POA: Diagnosis not present

## 2022-05-04 DIAGNOSIS — I10 Essential (primary) hypertension: Secondary | ICD-10-CM | POA: Diagnosis not present

## 2022-05-16 ENCOUNTER — Encounter: Payer: Medicare HMO | Attending: Physical Medicine & Rehabilitation | Admitting: Physical Medicine & Rehabilitation

## 2022-05-16 ENCOUNTER — Encounter: Payer: Self-pay | Admitting: Physical Medicine & Rehabilitation

## 2022-05-16 VITALS — BP 143/77 | HR 89 | Ht 61.0 in | Wt 96.8 lb

## 2022-05-16 DIAGNOSIS — I6381 Other cerebral infarction due to occlusion or stenosis of small artery: Secondary | ICD-10-CM | POA: Insufficient documentation

## 2022-05-16 NOTE — Progress Notes (Signed)
Subjective:    Patient ID: Sheri Pope, female    DOB: May 20, 1940, 82 y.o.   MRN: [id number] Inpt rehab stroke f/u Admit date: 03/20/2022 Discharge date: 03/29/2022  82 y.o. right-handed female with history of hyperlipidemia severe advanced rheumatoid arthritis with multiple joint replacements maintained on hydrocodone 5/325 mg every 6 hours as needed, tobacco use as well as leukemia followed by Dr.Katragadda and refused current treatment.  Per chart review lives in a 1 level home with a ramped entrance.  She does not drive.  Nephew and daughter provide support as needed.  Presented to [facility] 03/13/2022 with acute onset of right-sided weakness and slurred speech.  CT/MRI showed acute lacunar infarct at the left thalamocapsular junction as well as chronic small vessel ischemia including chronic lacunar infarcts of the left thalamus.  Patient did not receive tPA.  CT angiogram head and neck no emergent large vessel occlusion.  Noted incidental findings of mildly enlarged and heterogeneous thyroid gland.  Ultrasound of the thyroid showed heterogeneous and borderline enlarged thyroid compatible with medical thyroid disease/goiter and no suspicious nodule seen.  Admission chemistries unremarkable urine drug screen positive opiates sodium 129.  Echocardiogram with ejection fraction of 50 to 55% no wall motion abnormalities grade 1 diastolic dysfunction.  Neurology follow-up maintained on low-dose aspirin and Plavix for CVA prophylaxis x 3 weeks then aspirin alone.  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  Therapy evaluations completed due to patient decreased functional ability right-sided weakness was admitted for a comprehensive rehab program.  HPI  Pt asking about etiology of CVA, discussed small vessel disease as well as HTN and smoking as risk factors.  Has gone back to smoking since discharge from hospital.  Had been on  nicoderm patch  HHPT finished Needs supervision for showering  Mod I for dressing  No falls  Has seen Dr Nevada Crane , PCP started pravachol in place of lipitor  Pain Inventory Average Pain 6 Pain Right Now 6 My pain is dull and aching  LOCATION OF PAIN  knee  BOWEL Number of stools per week: 7 Oral laxative use No  Type of laxative . Enema or suppository use No  History of colostomy No  Incontinent No   BLADDER Normal In and out cath, frequency . Able to self cath . Bladder incontinence No  Frequent urination Yes  Leakage with coughing No  Difficulty starting stream No  Incomplete bladder emptying No    Mobility use a walker ability to climb steps?  no do you drive?  no  Function not employed: date last employed . I need assistance with the following:  bathing  Neuro/Psych weakness trouble walking  Prior Studies Any changes since last visit?  no  Physicians involved in your care Any changes since last visit?  yes   Family History  Problem Relation Age of Onset   Diabetes Sister    Macular degeneration Sister    Heart attack Mother    Stroke Father    Dementia Father    Cancer Brother    Healthy Sister    Healthy Sister    Diabetes Brother    Social History   Socioeconomic History   Marital status: Widowed    Spouse name: Not on file   Number of children: Not on file   Years of education: Not on file   Highest education level: Not on file  Occupational History   Occupation: retired  Tobacco Use   Smoking status: Every Day    Packs/day: 1.00    Years: 50.00    Additional pack years: 0.00    Total pack years: 50.00    Types: Cigarettes   Smokeless tobacco: Never  Vaping Use   Vaping Use: Never used  Substance and Sexual Activity   Alcohol use: No   Drug use: No   Sexual activity: Not Currently    Birth control/protection: Surgical  Other Topics Concern   Not on file  Social History Narrative   Not on file   Social Determinants of  Health   Financial Resource Strain: Not on file  Food Insecurity: No Food Insecurity (03/14/2022)   Hunger Vital Sign    Worried About Running Out of Food in the Last Year: Never true    Ran Out of Food in the Last Year: Never true  Transportation Needs: No Transportation Needs (03/14/2022)   PRAPARE - Hydrologist (Medical): No    Lack of Transportation (Non-Medical): No  Physical Activity: Not on file  Stress: Not on file  Social Connections: Not on file   Past Surgical History:  Procedure Laterality Date   ABDOMINAL HYSTERECTOMY  1870'S   BREAST SURGERY     BREAST BIOSPIES   CATARACT EXTRACTION W/PHACO Left 05/30/2012   Procedure: CATARACT EXTRACTION PHACO AND INTRAOCULAR LENS PLACEMENT (IOC);  Surgeon: Tonny Branch, MD;  Location: [facility];  Service: Ophthalmology;  Laterality: Left;  CDE: 16.99   CATARACT EXTRACTION W/PHACO Right 06/24/2012   Procedure: CATARACT EXTRACTION PHACO AND INTRAOCULAR LENS PLACEMENT (IOC);  Surgeon: Tonny Branch, MD;  Location: [facility];  Service: Ophthalmology;  Laterality: Right;  CDE:17.92   COLONOSCOPY WITH PROPOFOL N/A 10/14/2020   Procedure: COLONOSCOPY WITH PROPOFOL;  Surgeon: Daneil Dolin, MD;  [facility];  Service: Endoscopy;  Laterality: N/A;  ASA III / 12:30   ESOPHAGOGASTRODUODENOSCOPY (EGD) WITH PROPOFOL N/A 10/14/2020   Procedure: ESOPHAGOGASTRODUODENOSCOPY (EGD) WITH PROPOFOL;  Surgeon: Daneil Dolin, MD;  [facility];  Service: Endoscopy;  Laterality: N/A;   HIP SURGERY     JOINT REPLACEMENT  2007   RIGHT HIP REPLACEMENT   LEFT KNEE ARTHROSCOPY  AUG 2012   MALONEY DILATION  10/14/2020   Procedure: MALONEY DILATION;  Surgeon: Daneil Dolin, MD;  [facility];  Service: Endoscopy;;   MULTIPLE ORTHOPEDIC SURGERIES     ON BOTH HANDS AND BOTH FEET, RIGHT ELBOW   SPLEENECTOMY  IN THE 70'S   FOR LARGE CYST   TOTAL KNEE ARTHROPLASTY  11/10/2011   Procedure: TOTAL KNEE ARTHROPLASTY;   Surgeon: Mcarthur Rossetti, MD;  Location: [facility];  Service: Orthopedics;  Laterality: Left;  Left Total Knee Arthroplasty   YAG LASER APPLICATION Bilateral    Dr. Geoffry Paradise   Past Medical History:  Diagnosis Date   Arthritis    RA AND OA --PAIN AND SWELLIN IN LEFT KNEE   Blood in urine    NEGATIVE UROLOGY WORK UP --BUT ALWAYS HAS BLOOD IN URINE   GERD (gastroesophageal reflux disease)    H/O hiatal hernia    Hyperlipidemia    Leukemia (HCC)    PONV (postoperative nausea and vomiting)    ALWAYS SICK AFTER SURGERIES EXCEPT AFTER HIP REPLACMENT 2007   Vitamin D deficiency    BP (!) 143/77   Pulse 89   Ht 5\' 1"  (1.549 m)   Wt 96 lb 12.8 oz (43.9 kg)   SpO2  95%   BMI 18.29 kg/m   Opioid Risk Score:   Fall Risk Score:  `1  Depression screen [facility] 2/9     04/12/2022   10:46 AM  Depression screen PHQ 2/9  Decreased Interest 0  Down, Depressed, Hopeless 0  PHQ - 2 Score 0  Altered sleeping 0  Tired, decreased energy 2  Change in appetite 0  Feeling bad or failure about yourself  0  Trouble concentrating 0  Moving slowly or fidgety/restless 0  Suicidal thoughts 0  PHQ-9 Score 2  Difficult doing work/chores Not difficult at all     Review of Systems  Musculoskeletal:  Positive for gait problem.       Knee pain  Neurological:  Positive for weakness.  All other systems reviewed and are negative.     Objective:   Physical Exam   MSK- multiple joint deformities due to RA with ulnar deviation of MCPs,  Multiple rheumatoid nodules on hands wrists and ankles  Motor  grip limited to 4- due to RA deformities 5/5 in Delt, Bi tri, HF, KE ADF Sesnation intact LT in BUE and BLEs Speech without dysarthria or aphasia A and O x 3       Assessment & Plan:  Hx of Left thalamocapsular infarct with R HP which has essentially resolved still has increased gait imbalance but otherwise at Mod I level .  Has completed HHPT, no transportation for OP therapies and is close to  functional baseline. No PMR f/u needed Cont HEP Discussed smoking cessation as having a major benefit to reduce stroke recurrence risk  F/u PCP and Rheum and Neuro

## 2022-05-31 ENCOUNTER — Ambulatory Visit: Payer: Medicare HMO | Admitting: Neurology

## 2022-05-31 ENCOUNTER — Encounter: Payer: Self-pay | Admitting: Neurology

## 2022-05-31 VITALS — BP 153/76 | HR 93 | Ht 61.0 in | Wt 97.0 lb

## 2022-05-31 DIAGNOSIS — I6381 Other cerebral infarction due to occlusion or stenosis of small artery: Secondary | ICD-10-CM | POA: Diagnosis not present

## 2022-05-31 DIAGNOSIS — Z720 Tobacco use: Secondary | ICD-10-CM

## 2022-05-31 NOTE — Progress Notes (Signed)
[facility] [address] [place]. [place] [address] [phone]       OFFICE CONSULT NOTE  [name] Date of Birth:  December 07, 1940 Medical Record Number:  [id number]   Referring MD: Irwin Brakeman  Reason for Referral: Stroke  HPI: Sheri Pope  is a pleasant 82 year old Caucasian lady seen today for initial office consultation visit for stroke.  History is obtained from the patient and her friend Chong Sicilian who is accompanying her today as well as review of electronic medical records and I personally reviewed pertinent available imaging films in PACS.  She presented to [facility] on 03/14/2022 with sudden onset of weakness in the right foot at 11 AM the day before.  Patient called EMS today stating that her sugar was high.  They wanted to take her to the hospital but patient refused.  She was stumbling around and falling and so finally called EMS back in the evening came to the hospital.  MRI scan of the brain showed a right thalamic lacunar infarct.  CT angiogram of the brain and neck showed no significant large vessel stenosis or occlusion and only mild atheromatous disease.  2D echo showed ejection fraction of 50 to 55%.  LDL cholesterol was 98 mg percent and hemoglobin A1c 5.7.  Patient was started on dual antiplatelet therapy aspirin and Plavix followed by aspirin.  He was discharged home in good home physical and Occupational Therapy.  She has finished.  He distances independently with use of a cane for long distances.  She has had no falls or injuries.  She is tolerating aspirin well without bruising or bleeding.  She is also tolerating Pravachol well without side effects.  Blood pressure remains elevated in the [id number] systolic range and today it is 153/76.  She does plan to see her primary care physician next week to address this.  She is chronic daily smoker however she is willing to quit now.  ROS:   14 system review of systems is positive for weakness,  numbness, difficulty walking, bruising all other systems negative  PMH:  Past Medical History:  Diagnosis Date   Arthritis    RA AND OA --PAIN AND SWELLIN IN LEFT KNEE   Blood in urine    NEGATIVE UROLOGY WORK UP --BUT ALWAYS HAS BLOOD IN URINE   GERD (gastroesophageal reflux disease)    H/O hiatal hernia    Hyperlipidemia    Leukemia    PONV (postoperative nausea and vomiting)    ALWAYS SICK AFTER SURGERIES EXCEPT AFTER HIP REPLACMENT 2007   Vitamin D deficiency     Social History:  Social History   Socioeconomic History   Marital status: Widowed    Spouse name: Not on file   Number of children: Not on file   Years of education: Not on file   Highest education level: Not on file  Occupational History   Occupation: retired  Tobacco Use   Smoking status: Every Day    Packs/day: 1.00    Years: 50.00    Additional pack years: 0.00    Total pack years: 50.00    Types: Cigarettes   Smokeless tobacco: Never  Vaping Use   Vaping Use: Never used  Substance and Sexual Activity   Alcohol use: No   Drug use: No   Sexual activity: Not Currently    Birth control/protection: Surgical  Other Topics Concern   Not on file  Social History Narrative   Not on file   Social  Determinants of Health   Financial Resource Strain: Not on file  Food Insecurity: No Food Insecurity (03/14/2022)   Hunger Vital Sign    Worried About Running Out of Food in the Last Year: Never true    Ran Out of Food in the Last Year: Never true  Transportation Needs: No Transportation Needs (03/14/2022)   PRAPARE - Hydrologist (Medical): No    Lack of Transportation (Non-Medical): No  Physical Activity: Not on file  Stress: Not on file  Social Connections: Not on file  Intimate Partner Violence: Not At Risk (03/14/2022)   Humiliation, Afraid, Rape, and Kick questionnaire    Fear of Current or Ex-Partner: No    Emotionally Abused: No    Physically Abused: No    Sexually  Abused: No    Medications:   Current Outpatient Medications on File Prior to Visit  Medication Sig Dispense Refill   acetaminophen (TYLENOL) 325 MG tablet Take 2 tablets (650 mg total) by mouth every 6 (six) hours as needed for mild pain (or Fever >/= 101).     ALPRAZolam (XANAX) 0.25 MG tablet Take 1 tablet (0.25 mg total) by mouth at bedtime. 30 tablet 0   aspirin 81 MG chewable tablet Chew 1 tablet (81 mg total) by mouth daily.     HYDROcodone-acetaminophen (NORCO/VICODIN) 5-325 MG tablet Take 0.5 tablets by mouth 2 (two) times daily as needed for moderate pain. 30 tablet 0   losartan (COZAAR) 25 MG tablet Take 25 mg by mouth daily.     methocarbamol (ROBAXIN) 500 MG tablet Take 1 tablet (500 mg total) by mouth 2 (two) times daily. 60 tablet 0   pravastatin (PRAVACHOL) 10 MG tablet Take 10 mg by mouth daily.     No current facility-administered medications on file prior to visit.    Allergies:   Allergies  Allergen Reactions   Codeine Nausea And Vomiting   Prednisone Other (See Comments)    Thought she was having a heart attack   Sulfa Antibiotics Other (See Comments)    unknown   Aleve [Naproxen] Anxiety    Jittery and hot flashes   Latex Rash    Physical Exam General: Frail petite elderly Caucasian lady, seated, in no evident distress Head: head normocephalic and atraumatic.   Neck: supple with no carotid or supraclavicular bruits Cardiovascular: regular rate and rhythm, no murmurs Musculoskeletal: Deformity of both hands from rheumatoid arthritis  Vascular:  Normal pulses all extremities Skin multiple subcutaneous nodules from rheumatoid arthritis in both arms and forearms Neurologic Exam Mental Status: Awake and fully alert. Oriented to place and time. Recent and remote memory intact. Attention span, concentration and fund of knowledge appropriate. Mood and affect appropriate.  Cranial Nerves: Fundoscopic exam reveals sharp disc margins. Pupils equal, briskly reactive  to light. Extraocular movements full without nystagmus. Visual fields full to confrontation. Hearing intact. Facial sensation intact. Face, tongue, palate moves normally and symmetrically.  Motor: Normal bulk and tone. Normal strength in all tested extremity muscles.  Mild right grip weakness.  Diminished fine finger movements on the right.  Orbits left or right upper extremity.  Mild right ankle dorsiflexor weakness. Sensory.: intact to touch , pinprick , position and vibratory sensation.  Coordination: Rapid alternating movements normal in all extremities. Finger-to-nose and heel-to-shin performed accurately bilaterally. Gait and Station: Arises from chair without difficulty. Stance is normal. Gait slight dragging of the right leg.  Unable to walk tandem.   Reflexes: 1+ and symmetric.  Toes downgoing.   NIHSS  2 Modified Rankin  2   ASSESSMENT: 82 year old Caucasian lady with left thalamic lacunar infarct and January 2024 from small vessel disease.  Vascular risk factors of smoking, hypertension, hyperlipidemia and age.  She is doing reasonably well with only mild residual weakness     PLAN:I had a long d/w patient about her recent thalamic lacunar stroke, risk for recurrent stroke/TIAs, personally independently reviewed imaging studies and stroke evaluation results and answered questions.Continue aspirin 81 mg daily  for secondary stroke prevention and maintain strict control of hypertension with blood pressure goal below 130/90, diabetes with hemoglobin A1c goal below 6.5% and lipids with LDL cholesterol goal below 70 mg/dL. I also advised the patient to eat a healthy diet with plenty of whole grains, cereals, fruits and vegetables, exercise regularly and maintain ideal body weight .I advised her to quit smoking completely and offered help but she declined this at the present time.  I advised her to see primary physician Dr. Nevada Crane for tighter blood pressure control and..  Advised her to use a cane  while walking long distances and outdoors for fall prevention precautions.  Followup in the future with practitioner in 6 months or call earlier if necessary.  Greater than 50% time during the 45-minute consultation visit were spent on counseling and coordination of care about her lacunar stroke and discussion about stroke prevention and treatment  Antony Contras, MD Note: This document was prepared with digital dictation and possible smart phrase technology. Any transcriptional errors that result from this process are unintentional.

## 2022-05-31 NOTE — Patient Instructions (Signed)
I had a long d/w patient about her recent thalamic lacunar stroke, risk for recurrent stroke/TIAs, personally independently reviewed imaging studies and stroke evaluation results and answered questions.Continue aspirin 81 mg daily  for secondary stroke prevention and maintain strict control of hypertension with blood pressure goal below 130/90, diabetes with hemoglobin A1c goal below 6.5% and lipids with LDL cholesterol goal below 70 mg/dL. I also advised the patient to eat a healthy diet with plenty of whole grains, cereals, fruits and vegetables, exercise regularly and maintain ideal body weight .I advised her to quit smoking completely and offered help but she declined this at the present time.  I advised her to see primary physician Dr. Nevada Crane for tighter blood pressure control and..  Advised her to use a cane while walking long distances and outdoors for fall prevention precautions.  Followup in the future with practitioner in 6 months or call earlier if necessary.  Stroke Prevention Some medical conditions and behaviors can lead to a higher chance of having a stroke. You can help prevent a stroke by eating healthy, exercising, not smoking, and managing any medical conditions you have. Stroke is a leading cause of functional impairment. Primary prevention is particularly important because a majority of strokes are first-time events. Stroke changes the lives of not only those who experience a stroke but also their family and other caregivers. How can this condition affect me? A stroke is a medical emergency and should be treated right away. A stroke can lead to brain damage and can sometimes be life-threatening. If a person gets medical treatment right away, there is a better chance of surviving and recovering from a stroke. What can increase my risk? The following medical conditions may increase your risk of a stroke: Cardiovascular disease. High blood pressure (hypertension). Diabetes. High  cholesterol. Sickle cell disease. Blood clotting disorders (hypercoagulable state). Obesity. Sleep disorders (obstructive sleep apnea). Other risk factors include: Being older than age 23. Having a history of blood clots, stroke, or mini-stroke (transient ischemic attack, TIA). Genetic factors, such as race, ethnicity, or a family history of stroke. Smoking cigarettes or using other tobacco products. Taking birth control pills, especially if you also use tobacco. Heavy use of alcohol or drugs, especially cocaine and methamphetamine. Physical inactivity. What actions can I take to prevent this? Manage your health conditions High cholesterol levels. Eating a healthy diet is important for preventing high cholesterol. If cholesterol cannot be managed through diet alone, you may need to take medicines. Take any prescribed medicines to control your cholesterol as told by your health care provider. Hypertension. To reduce your risk of stroke, try to keep your blood pressure below 130/80. Eating a healthy diet and exercising regularly are important for controlling blood pressure. If these steps are not enough to manage your blood pressure, you may need to take medicines. Take any prescribed medicines to control hypertension as told by your health care provider. Ask your health care provider if you should monitor your blood pressure at home. Have your blood pressure checked every year, even if your blood pressure is normal. Blood pressure increases with age and some medical conditions. Diabetes. Eating a healthy diet and exercising regularly are important parts of managing your blood sugar (glucose). If your blood sugar cannot be managed through diet and exercise, you may need to take medicines. Take any prescribed medicines to control your diabetes as told by your health care provider. Get evaluated for obstructive sleep apnea. Talk to your health care provider about getting  a sleep evaluation if  you snore a lot or have excessive sleepiness. Make sure that any other medical conditions you have, such as atrial fibrillation or atherosclerosis, are managed. Nutrition Follow instructions from your health care provider about what to eat or drink to help manage your health condition. These instructions may include: Reducing your daily calorie intake. Limiting how much salt (sodium) you use to 1,500 milligrams (mg) each day. Using only healthy fats for cooking, such as olive oil, canola oil, or sunflower oil. Eating healthy foods. You can do this by: Choosing foods that are high in fiber, such as whole grains, and fresh fruits and vegetables. Eating at least 5 servings of fruits and vegetables a day. Try to fill one-half of your plate with fruits and vegetables at each meal. Choosing lean protein foods, such as lean cuts of meat, poultry without skin, fish, tofu, beans, and nuts. Eating low-fat dairy products. Avoiding foods that are high in sodium. This can help lower blood pressure. Avoiding foods that have saturated fat, trans fat, and cholesterol. This can help prevent high cholesterol. Avoiding processed and prepared foods. Counting your daily carbohydrate intake.  Lifestyle If you drink alcohol: Limit how much you have to: 0-1 drink a day for women who are not pregnant. 0-2 drinks a day for men. Know how much alcohol is in your drink. In the [place] one drink equals one 12 oz bottle of beer (332mL), one 5 oz glass of wine (122mL), or one 1 oz glass of hard liquor (25mL). Do not use any products that contain nicotine or tobacco. These products include cigarettes, chewing tobacco, and vaping devices, such as e-cigarettes. If you need help quitting, ask your health care provider. Avoid secondhand smoke. Do not use drugs. Activity  Try to stay at a healthy weight. Get at least 30 minutes of exercise on most days, such as: Fast walking. Biking. Swimming. Medicines Take  over-the-counter and prescription medicines only as told by your health care provider. Aspirin or blood thinners (antiplatelets or anticoagulants) may be recommended to reduce your risk of forming blood clots that can lead to stroke. Avoid taking birth control pills. Talk to your health care provider about the risks of taking birth control pills if: You are over 48 years old. You smoke. You get very bad headaches. You have had a blood clot. Where to find more information American Stroke Association: www.strokeassociation.org Get help right away if: You or a loved one has any symptoms of a stroke. "BE FAST" is an easy way to remember the main warning signs of a stroke: B - Balance. Signs are dizziness, sudden trouble walking, or loss of balance. E - Eyes. Signs are trouble seeing or a sudden change in vision. F - Face. Signs are sudden weakness or numbness of the face, or the face or eyelid drooping on one side. A - Arms. Signs are weakness or numbness in an arm. This happens suddenly and usually on one side of the body. S - Speech. Signs are sudden trouble speaking, slurred speech, or trouble understanding what people say. T - Time. Time to call emergency services. Write down what time symptoms started. You or a loved one has other signs of a stroke, such as: A sudden, severe headache with no known cause. Nausea or vomiting. Seizure. These symptoms may represent a serious problem that is an emergency. Do not wait to see if the symptoms will go away. Get medical help right away. Call your local emergency services (911  in the [place] Do not drive yourself to the hospital. Summary You can help to prevent a stroke by eating healthy, exercising, not smoking, limiting alcohol intake, and managing any medical conditions you may have. Do not use any products that contain nicotine or tobacco. These include cigarettes, chewing tobacco, and vaping devices, such as e-cigarettes. If you need help quitting,  ask your health care provider. Remember "BE FAST" for warning signs of a stroke. Get help right away if you or a loved one has any of these signs. This information is not intended to replace advice given to you by your health care provider. Make sure you discuss any questions you have with your health care provider. Document Revised: 08/28/2019 Document Reviewed: 09/15/2019 Elsevier Patient Education  [place].

## 2022-06-08 DIAGNOSIS — H52223 Regular astigmatism, bilateral: Secondary | ICD-10-CM | POA: Diagnosis not present

## 2022-06-08 DIAGNOSIS — H5203 Hypermetropia, bilateral: Secondary | ICD-10-CM | POA: Diagnosis not present

## 2022-06-08 DIAGNOSIS — H43393 Other vitreous opacities, bilateral: Secondary | ICD-10-CM | POA: Diagnosis not present

## 2022-06-28 ENCOUNTER — Other Ambulatory Visit: Payer: Self-pay

## 2022-06-28 DIAGNOSIS — D492 Neoplasm of unspecified behavior of bone, soft tissue, and skin: Secondary | ICD-10-CM | POA: Diagnosis not present

## 2022-06-28 DIAGNOSIS — C44319 Basal cell carcinoma of skin of other parts of face: Secondary | ICD-10-CM | POA: Diagnosis not present

## 2022-06-28 DIAGNOSIS — C91Z0 Other lymphoid leukemia not having achieved remission: Secondary | ICD-10-CM

## 2022-06-28 DIAGNOSIS — D225 Melanocytic nevi of trunk: Secondary | ICD-10-CM | POA: Diagnosis not present

## 2022-06-28 DIAGNOSIS — L821 Other seborrheic keratosis: Secondary | ICD-10-CM | POA: Diagnosis not present

## 2022-06-28 NOTE — Progress Notes (Unsigned)
[facility] [address], [place] [address]   CLINIC:  Medical Oncology/Hematology  PCP:  Benita Stabile, MD [address] [place] [address] [phone]   REASON FOR VISIT:  Follow-up for large granular lymphocytic leukemia  PRIOR THERAPY: Splenectomy in 1970s  CURRENT THERAPY: Surveillance  INTERVAL HISTORY:   [name] 82 y.o. 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  She was last seen by Dr. Ellin Saba on 12/27/2021.  At today's visit, she reports feeling fair.  Since her last visit, she was hospitalized from 03/13/2022 to 03/20/2022 for acute CVA (left thalamic infarct with right-sided weakness), followed by inpatient rehab through 03/29/2022.  She continues to have some right arm and leg weakness.    In the last 6 months, she only recalls being treated once with antibiotics (UTI during hospitalization January 2024).  She does report that she has a soft tissue nodules on bilateral hands that are becoming infected more often - she describes swelling, tenderness, and erythema that "comes to a head" before rupturing with puslike drainage.  She reports that she treats this with triple antibiotic salve at home, which helps the sores to resolve.  She reports stable rheumatoid arthritis pain about 6/10 to 7/10 at baseline.  She denies any unintentional weight loss or night sweats.  She continues to have intermittent fevers, up to Tmax 100.4 F.  She reports that her appetite "comes and goes."  She had rash under bilateral arms last month, that resolved with moisturizing lotion.  She denies any new lumps or bumps.  She had basal cell carcinoma removed from her chin yesterday.  She has 25% energy and 70% appetite. She endorses that she is maintaining a stable weight.  ASSESSMENT & PLAN:  1.  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  Hemoglobin and platelet count were normal. - History of splenectomy at age 57 secondary to a cyst on it. - CT CAP on 11/06/2019 showed a borderline prominent right axillary lymph node, 1.4 cm right upper normal-sized left axillary and right subpectoral lymph nodes with no overtly pathologic adenopathy.  Mild chronic diffuse prominence of thyroid gland without discrete nodule. - T-cell receptor gene rearrangement showed clonal T-cell gamma population as well as clonal T-cell beta population detected.  This usually reflects presence of T-cell lymphocytic neoplasm. - Bone marrow biopsy on 08/11/2020 shows normocellular bone marrow for age, moderate involvement by interstitial T-cell infiltrates.  This favors large granular lymphocytic leukemia. 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  This can be detected in 30 to 40% cases of T-cell LGL leukemia.  Polytypic plasma cells about 10% were seen.  Chromosome analysis was 5, XX. - CTAP on 06/02/2021 with no evidence of abdominal or pelvic adenopathy. ------------------------- - Required antibiotics once in the past 6 months (UTI in January 2024), but reports increasing frequency of pustular nodules of her bilateral hands - She reports intermittent fevers with Tmax 100.4 F that will "come and go" on their own - She denies any unintentional weight loss, night sweats, or lymphadenopathy/masses.  Appetite has been stable. - No palpable lymphadenopathy on exam. - Labs today (06/29/2022): Critical neutropenia (ANC 0.1), elevated lymphocytes (4.4).  Otherwise CBC is grossly normal.  LFTs and LDH are normal. - Patient continues to refuse treatment with methotrexate and prednisone, citing concerns about side effects.  She reports that  she "does not do well with medicine," and that previous prednisone in the past caused severe agitation and pyrexia - PLAN: Patient has refused treatment with methotrexate and prednisone. -  Her counts are stable. - RTC in 6 months for MD visit and labs (CBC/D, CMP, LDH)  2.  Weight loss: - Weight is improved from last visit.   - PLAN: Continue Ensure or Boost daily  3.  Rheumatoid arthritis: - She has rheumatoid arthritis of the extremities, predominantly in the upper extremities with rheumatoid nodules. - Uses Voltaren and aspirin cream for anti-inflammatories. - She takes hydrocodone and methocarbamol as needed for analgesia.  4.  Social/family history: - She lives at home with her nephew.  She did office work prior to retirement. - She is a current active smoker, 1 pack/day for 50+ years. - Brother had cancer, type unknown.  PLAN SUMMARY: >> Labs in 6 months = CBC/D, CMP, LDH >> OFFICE visit in 6 months      REVIEW OF SYSTEMS:   Review of Systems  Constitutional:  Positive for fatigue. Negative for appetite change, chills, diaphoresis, fever and unexpected weight change.  HENT:   Negative for lump/mass and nosebleeds.   Eyes:  Negative for eye problems.  Respiratory:  Positive for cough and shortness of breath. Negative for hemoptysis.   Cardiovascular:  Negative for chest pain, leg swelling and palpitations.  Gastrointestinal:  Negative for abdominal pain, blood in stool, constipation, diarrhea, nausea and vomiting.  Genitourinary:  Negative for hematuria.   Musculoskeletal:  Positive for arthralgias.  Skin: Negative.   Neurological:  Negative for dizziness, headaches and light-headedness.  Hematological:  Does not bruise/bleed easily.     PHYSICAL EXAM:  ECOG PERFORMANCE STATUS: 1 - Symptomatic but completely ambulatory  There were no vitals filed for this visit. There were no vitals filed for this visit. Physical Exam Constitutional:      Appearance: Normal appearance. She is normal weight.  Cardiovascular:     Heart sounds: Normal heart sounds.  Pulmonary:     Breath sounds: Normal breath sounds.  Musculoskeletal:        General: Deformity  (Severe contractures and nodules of bilateral hands) present.  Neurological:     General: No focal deficit present.     Mental Status: Mental status is at baseline.     Motor: Weakness (Right-sided hemiparesis from recent CVA) present.  Psychiatric:        Behavior: Behavior normal. Behavior is cooperative.     PAST MEDICAL/SURGICAL HISTORY:  Past Medical History:  Diagnosis Date   Arthritis    RA AND OA --PAIN AND SWELLIN IN LEFT KNEE   Blood in urine    NEGATIVE UROLOGY WORK UP --BUT ALWAYS HAS BLOOD IN URINE   GERD (gastroesophageal reflux disease)    H/O hiatal hernia    Hyperlipidemia    Leukemia (HCC)    PONV (postoperative nausea and vomiting)    ALWAYS SICK AFTER SURGERIES EXCEPT AFTER HIP REPLACMENT 2007   Vitamin D deficiency    Past Surgical History:  Procedure Laterality Date   ABDOMINAL HYSTERECTOMY  1870'S   BREAST SURGERY     BREAST BIOSPIES   CATARACT EXTRACTION W/PHACO Left 05/30/2012   Procedure: CATARACT EXTRACTION PHACO AND INTRAOCULAR LENS PLACEMENT (IOC);  Surgeon: Gemma Payor, MD;  Location: [facility];  Service: Ophthalmology;  Laterality: Left;  CDE: 16.99   CATARACT EXTRACTION W/PHACO Right 06/24/2012   Procedure: CATARACT EXTRACTION PHACO AND INTRAOCULAR LENS PLACEMENT (IOC);  Surgeon: Nash Dimmer  Alto Denver, MD;  Location: [facility];  Service: Ophthalmology;  Laterality: Right;  CDE:17.92   COLONOSCOPY WITH PROPOFOL N/A 10/14/2020   Procedure: COLONOSCOPY WITH PROPOFOL;  Surgeon: Corbin Ade, MD;  Location: [facility];  Service: Endoscopy;  Laterality: N/A;  ASA III / 12:30   ESOPHAGOGASTRODUODENOSCOPY (EGD) WITH PROPOFOL N/A 10/14/2020   Procedure: ESOPHAGOGASTRODUODENOSCOPY (EGD) WITH PROPOFOL;  Surgeon: Corbin Ade, MD;  Location: [facility];  Service: Endoscopy;  Laterality: N/A;   HIP SURGERY     JOINT REPLACEMENT  2007   RIGHT HIP REPLACEMENT   LEFT KNEE ARTHROSCOPY  AUG 2012   MALONEY DILATION  10/14/2020   Procedure: MALONEY DILATION;  Surgeon:  Corbin Ade, MD;  Location: [facility];  Service: Endoscopy;;   MULTIPLE ORTHOPEDIC SURGERIES     ON BOTH HANDS AND BOTH FEET, RIGHT ELBOW   SPLEENECTOMY  IN THE 70'S   FOR LARGE CYST   TOTAL KNEE ARTHROPLASTY  11/10/2011   Procedure: TOTAL KNEE ARTHROPLASTY;  Surgeon: Kathryne Hitch, MD;  Location: [facility];  Service: Orthopedics;  Laterality: Left;  Left Total Knee Arthroplasty   YAG LASER APPLICATION Bilateral    Dr. Alto Denver    SOCIAL HISTORY:  Social History   Socioeconomic History   Marital status: Widowed    Spouse name: Not on file   Number of children: Not on file   Years of education: Not on file   Highest education level: Not on file  Occupational History   Occupation: retired  Tobacco Use   Smoking status: Every Day    Packs/day: 1.00    Years: 50.00    Additional pack years: 0.00    Total pack years: 50.00    Types: Cigarettes   Smokeless tobacco: Never  Vaping Use   Vaping Use: Never used  Substance and Sexual Activity   Alcohol use: No   Drug use: No   Sexual activity: Not Currently    Birth control/protection: Surgical  Other Topics Concern   Not on file  Social History Narrative   Not on file   Social Determinants of Health   Financial Resource Strain: Not on file  Food Insecurity: No Food Insecurity (03/14/2022)   Hunger Vital Sign    Worried About Running Out of Food in the Last Year: Never true    Ran Out of Food in the Last Year: Never true  Transportation Needs: No Transportation Needs (03/14/2022)   PRAPARE - Administrator, Civil Service (Medical): No    Lack of Transportation (Non-Medical): No  Physical Activity: Not on file  Stress: Not on file  Social Connections: Not on file  Intimate Partner Violence: Not At Risk (03/14/2022)   Humiliation, Afraid, Rape, and Kick questionnaire    Fear of Current or Ex-Partner: No    Emotionally Abused: No    Physically Abused: No    Sexually Abused: No    FAMILY HISTORY:   Family History  Problem Relation Age of Onset   Diabetes Sister    Macular degeneration Sister    Heart attack Mother    Stroke Father    Dementia Father    Cancer Brother    Healthy Sister    Healthy Sister    Diabetes Brother     CURRENT MEDICATIONS:  Outpatient Encounter Medications as of 06/29/2022  Medication Sig Note   acetaminophen (TYLENOL) 325 MG tablet Take 2 tablets (650 mg total) by mouth every 6 (six) hours as needed  for mild pain (or Fever >/= 101).    ALPRAZolam (XANAX) 0.25 MG tablet Take 1 tablet (0.25 mg total) by mouth at bedtime.    aspirin 81 MG chewable tablet Chew 1 tablet (81 mg total) by mouth daily.    HYDROcodone-acetaminophen (NORCO/VICODIN) 5-325 MG tablet Take 0.5 tablets by mouth 2 (two) times daily as needed for moderate pain. 05/16/2022: Didn't bring bottle, LD 05/16/22   losartan (COZAAR) 25 MG tablet Take 25 mg by mouth daily.    methocarbamol (ROBAXIN) 500 MG tablet Take 1 tablet (500 mg total) by mouth 2 (two) times daily.    pravastatin (PRAVACHOL) 10 MG tablet Take 10 mg by mouth daily.    No facility-administered encounter medications on file as of 06/29/2022.    ALLERGIES:  Allergies  Allergen Reactions   Codeine Nausea And Vomiting   Prednisone Other (See Comments)    Thought she was having a heart attack   Sulfa Antibiotics Other (See Comments)    unknown   Aleve [Naproxen] Anxiety    Jittery and hot flashes   Latex Rash    LABORATORY DATA:  I have reviewed the labs as listed.  CBC    Component Value Date/Time   WBC 4.8 03/21/2022 0757   RBC 4.00 03/21/2022 0757   HGB 13.6 03/21/2022 0757   HGB 14.3 05/31/2021 1118   HCT 39.7 03/21/2022 0757   HCT 40.5 05/31/2021 1118   PLT 265 03/21/2022 0757   PLT 287 05/31/2021 1118   MCV 99.3 03/21/2022 0757   MCV 94 05/31/2021 1118   MCH 34.0 03/21/2022 0757   MCHC 34.3 03/21/2022 0757   RDW 16.0 (H) 03/21/2022 0757   RDW 14.7 05/31/2021 1118   LYMPHSABS 4.3 (H) 03/21/2022 0757    LYMPHSABS 10.6 (H) 05/31/2021 1118   MONOABS 0.0 (L) 03/21/2022 0757   EOSABS 0.0 03/21/2022 0757   EOSABS 0.0 05/31/2021 1118   BASOSABS 0.1 03/21/2022 0757   BASOSABS 0.0 05/31/2021 1118      Latest Ref Rng & Units 03/24/2022    6:02 AM 03/21/2022    7:57 AM 03/17/2022    3:29 AM  CMP  Glucose 70 - 99 mg/dL 96  161    BUN 8 - 23 mg/dL 10  12    Creatinine 0.96 - 1.00 mg/dL 0.45  4.09    Sodium 811 - 145 mmol/L 131  132    Potassium 3.5 - 5.1 mmol/L 3.9  3.4  3.7   Chloride 98 - 111 mmol/L 98  99    CO2 22 - 32 mmol/L 23  26    Calcium 8.9 - 10.3 mg/dL 9.2  9.0    Total Protein 6.5 - 8.1 g/dL  7.5    Total Bilirubin 0.3 - 1.2 mg/dL  0.3    Alkaline Phos 38 - 126 U/L  90    AST 15 - 41 U/L  24    ALT 0 - 44 U/L  22      DIAGNOSTIC IMAGING:  I have independently reviewed the relevant imaging and discussed with the patient.   WRAP UP:  All questions were answered. The patient knows to call the clinic with any problems, questions or concerns.  Medical decision making: Moderate  Time spent on visit: I spent 20 minutes counseling the patient face to face. The total time spent in the appointment was 30 minutes and more than 50% was on counseling.  Carnella Guadalajara, PA-C  06/29/22 11:19 AM

## 2022-06-29 ENCOUNTER — Inpatient Hospital Stay: Payer: Medicare HMO | Attending: Physician Assistant | Admitting: Hematology

## 2022-06-29 ENCOUNTER — Inpatient Hospital Stay: Payer: Medicare HMO | Admitting: Physician Assistant

## 2022-06-29 ENCOUNTER — Ambulatory Visit: Payer: Medicare HMO | Admitting: Physician Assistant

## 2022-06-29 ENCOUNTER — Other Ambulatory Visit: Payer: Medicare HMO

## 2022-06-29 VITALS — BP 111/58 | HR 85 | Temp 97.7°F | Resp 19 | Ht 61.0 in | Wt 97.0 lb

## 2022-06-29 DIAGNOSIS — Z79899 Other long term (current) drug therapy: Secondary | ICD-10-CM | POA: Diagnosis not present

## 2022-06-29 DIAGNOSIS — Z85828 Personal history of other malignant neoplasm of skin: Secondary | ICD-10-CM | POA: Insufficient documentation

## 2022-06-29 DIAGNOSIS — C91Z0 Other lymphoid leukemia not having achieved remission: Secondary | ICD-10-CM | POA: Diagnosis not present

## 2022-06-29 DIAGNOSIS — E042 Nontoxic multinodular goiter: Secondary | ICD-10-CM | POA: Insufficient documentation

## 2022-06-29 DIAGNOSIS — K219 Gastro-esophageal reflux disease without esophagitis: Secondary | ICD-10-CM | POA: Insufficient documentation

## 2022-06-29 DIAGNOSIS — Z8673 Personal history of transient ischemic attack (TIA), and cerebral infarction without residual deficits: Secondary | ICD-10-CM | POA: Insufficient documentation

## 2022-06-29 DIAGNOSIS — M069 Rheumatoid arthritis, unspecified: Secondary | ICD-10-CM | POA: Diagnosis not present

## 2022-06-29 DIAGNOSIS — Z9081 Acquired absence of spleen: Secondary | ICD-10-CM | POA: Diagnosis not present

## 2022-06-29 DIAGNOSIS — F17210 Nicotine dependence, cigarettes, uncomplicated: Secondary | ICD-10-CM | POA: Diagnosis not present

## 2022-06-29 DIAGNOSIS — R531 Weakness: Secondary | ICD-10-CM | POA: Insufficient documentation

## 2022-06-29 DIAGNOSIS — Z7982 Long term (current) use of aspirin: Secondary | ICD-10-CM | POA: Diagnosis not present

## 2022-06-29 DIAGNOSIS — Z87440 Personal history of urinary (tract) infections: Secondary | ICD-10-CM | POA: Diagnosis not present

## 2022-06-29 DIAGNOSIS — R509 Fever, unspecified: Secondary | ICD-10-CM | POA: Insufficient documentation

## 2022-06-29 DIAGNOSIS — E785 Hyperlipidemia, unspecified: Secondary | ICD-10-CM | POA: Diagnosis not present

## 2022-06-29 LAB — CBC WITH DIFFERENTIAL/PLATELET
Abs Immature Granulocytes: 0 10*3/uL (ref 0.00–0.07)
Basophils Absolute: 0 10*3/uL (ref 0.0–0.1)
Basophils Relative: 1 %
Eosinophils Absolute: 0 10*3/uL (ref 0.0–0.5)
Eosinophils Relative: 1 %
HCT: 42.9 % (ref 36.0–46.0)
Hemoglobin: 14.4 g/dL (ref 12.0–15.0)
Immature Granulocytes: 0 %
Lymphocytes Relative: 91 %
Lymphs Abs: 4.4 10*3/uL — ABNORMAL HIGH (ref 0.7–4.0)
MCH: 34.3 pg — ABNORMAL HIGH (ref 26.0–34.0)
MCHC: 33.6 g/dL (ref 30.0–36.0)
MCV: 102.1 fL — ABNORMAL HIGH (ref 80.0–100.0)
Monocytes Absolute: 0.2 10*3/uL (ref 0.1–1.0)
Monocytes Relative: 4 %
Neutro Abs: 0.1 10*3/uL — CL (ref 1.7–7.7)
Neutrophils Relative %: 3 %
Platelets: 272 10*3/uL (ref 150–400)
RBC: 4.2 MIL/uL (ref 3.87–5.11)
RDW: 16.8 % — ABNORMAL HIGH (ref 11.5–15.5)
WBC: 4.8 10*3/uL (ref 4.0–10.5)
nRBC: 0 % (ref 0.0–0.2)

## 2022-06-29 LAB — COMPREHENSIVE METABOLIC PANEL
ALT: 20 U/L (ref 0–44)
AST: 20 U/L (ref 15–41)
Albumin: 3.6 g/dL (ref 3.5–5.0)
Alkaline Phosphatase: 113 U/L (ref 38–126)
Anion gap: 9 (ref 5–15)
BUN: 12 mg/dL (ref 8–23)
CO2: 24 mmol/L (ref 22–32)
Calcium: 9.4 mg/dL (ref 8.9–10.3)
Chloride: 97 mmol/L — ABNORMAL LOW (ref 98–111)
Creatinine, Ser: 0.67 mg/dL (ref 0.44–1.00)
GFR, Estimated: >60 mL/min (ref >60)
Glucose, Bld: 96 mg/dL (ref 70–99)
Potassium: 4.2 mmol/L (ref 3.5–5.1)
Sodium: 130 mmol/L — ABNORMAL LOW (ref 135–145)
Total Bilirubin: 0.6 mg/dL (ref 0.3–1.2)
Total Protein: 8.6 g/dL — ABNORMAL HIGH (ref 6.5–8.1)

## 2022-06-29 LAB — LACTATE DEHYDROGENASE: LDH: 133 U/L (ref 98–192)

## 2022-06-29 NOTE — Progress Notes (Signed)
CRITICAL VALUE ALERT Critical value received:  ANC 0.1 Date of notification:  06-29-2022 Time of notification: 0936 Critical value read back:  Yes.   Nurse who received alert:  B.Nephi Savage RN.  MD notified time and response:  R.Pennington PA @ 1009 am.  No new orders.

## 2022-06-29 NOTE — Patient Instructions (Signed)
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    LGL LEUKEMIA Your neutrophil white blood cells remain critically low, which is the main effect of your LGL leukemia. As we discussed, this places you at increased risk of infection and may also cause symptoms of intermittent fevers, chills, night sweats, or weight loss. Treatment options are available including methotrexate and prednisone.  If you change your mind and would like to pursue treatment, please call our office so that we can set this up.  NEUTROPENIC PRECAUTIONS Make sure that you are avoiding anyone with obvious illness.  Wash your hands frequently and thoroughly wash any fresh fruits or vegetables.  Avoid any raw or undercooked meats and eggs. Call our office IMMEDIATELY if you have any fever or other symptoms of infection.   FOLLOW-UP APPOINTMENT: Labs and office visit in 6 months  ** Thank you for trusting me with your healthcare!  I strive to provide all of my patients with quality care at each visit.  If you receive a survey for this visit, I would be so grateful to you for taking the time to provide feedback.  Thank you in advance!  ~ Asianna Brundage                   Dr. Doreatha Massed   &   Rojelio Brenner, PA-C   - - - - - - - - - - - - - - - - - -    Thank you for choosing [facility] at [facility] to provide your oncology and hematology care.  To afford each patient quality time with our provider, please arrive at least 15 minutes before your scheduled appointment time.   If you have a lab appointment with the Cancer Center please come in thru the Main Entrance and check in at the main information desk.  You need to re-schedule your appointment should you arrive 10 or more minutes late.  We strive to give you quality time with our providers, and arriving  late affects you and other patients whose appointments are after yours.  Also, if you no show three or more times for appointments you may be dismissed from the clinic at the providers discretion.     Again, thank you for choosing [facility].  Our hope is that these requests will decrease the amount of time that you wait before being seen by our physicians.       _____________________________________________________________  Should you have questions after your visit to [facility], please contact our office at [phone] and follow the prompts.  Our office hours are 8:00 a.m. and 4:30 p.m. Monday - Friday.  Please note that voicemails left after 4:00 p.m. may not be returned until the following business day.  We are closed weekends and major holidays.  You do have access to a nurse 24-7, just call the main number to the clinic [phone] and do not press any options, hold on the line and a nurse will answer the phone.    For prescription refill requests, have your pharmacy contact our office and allow 72 hours.

## 2022-07-03 ENCOUNTER — Other Ambulatory Visit: Payer: Self-pay

## 2022-07-03 DIAGNOSIS — C91Z0 Other lymphoid leukemia not having achieved remission: Secondary | ICD-10-CM

## 2022-07-05 DIAGNOSIS — E785 Hyperlipidemia, unspecified: Secondary | ICD-10-CM | POA: Diagnosis not present

## 2022-07-05 DIAGNOSIS — E559 Vitamin D deficiency, unspecified: Secondary | ICD-10-CM | POA: Diagnosis not present

## 2022-07-07 NOTE — Progress Notes (Signed)
Call received from PCP office that ANC is 0.0. Sheri Brenner, PA made aware. Below is her response:  "Patient has refused treatment for hr LGL Leukemia. She and I discussed extensivlely at her last visit - best tx if Methotrexate and Prednisone. If she would like to reconsider treatment, she can see me or Dr.K next week. Otherwise, just avoid getting sick and practice good neutropenic precautions!"  I called the patient and spoke to both her and her niece, Sheri Pope. Patient remains not interested in treatment. Patient remains as scheduled. Neutropenic precautions discussed and understanding has been verbalized.

## 2022-07-10 DIAGNOSIS — Z0000 Encounter for general adult medical examination without abnormal findings: Secondary | ICD-10-CM | POA: Diagnosis not present

## 2022-07-11 DIAGNOSIS — M069 Rheumatoid arthritis, unspecified: Secondary | ICD-10-CM | POA: Diagnosis not present

## 2022-07-11 DIAGNOSIS — E785 Hyperlipidemia, unspecified: Secondary | ICD-10-CM | POA: Diagnosis not present

## 2022-07-11 DIAGNOSIS — F411 Generalized anxiety disorder: Secondary | ICD-10-CM | POA: Diagnosis not present

## 2022-07-11 DIAGNOSIS — I69351 Hemiplegia and hemiparesis following cerebral infarction affecting right dominant side: Secondary | ICD-10-CM | POA: Diagnosis not present

## 2022-07-11 DIAGNOSIS — M0579 Rheumatoid arthritis with rheumatoid factor of multiple sites without organ or systems involvement: Secondary | ICD-10-CM | POA: Diagnosis not present

## 2022-07-11 DIAGNOSIS — C91Z0 Other lymphoid leukemia not having achieved remission: Secondary | ICD-10-CM | POA: Diagnosis not present

## 2022-07-11 DIAGNOSIS — C44319 Basal cell carcinoma of skin of other parts of face: Secondary | ICD-10-CM | POA: Diagnosis not present

## 2022-07-11 DIAGNOSIS — E871 Hypo-osmolality and hyponatremia: Secondary | ICD-10-CM | POA: Diagnosis not present

## 2022-07-11 DIAGNOSIS — I63412 Cerebral infarction due to embolism of left middle cerebral artery: Secondary | ICD-10-CM | POA: Diagnosis not present

## 2022-07-11 DIAGNOSIS — E049 Nontoxic goiter, unspecified: Secondary | ICD-10-CM | POA: Diagnosis not present

## 2022-09-21 IMAGING — CT CT ABD-PELV W/ CM
2 of 6 series · 16 of 46 positions shown, 18 images · IV contrast (Omnipaque or Isovue)
Comparison: 08/21/2020

CLINICAL DATA: 80-year-old female with history of lower abdominal
pain, nausea vomiting for 5 days.

EXAM:
CT ABDOMEN AND PELVIS WITH CONTRAST
TECHNIQUE: Multidetector CT imaging of the abdomen and pelvis was performed
using the standard protocol following bolus administration of
intravenous contrast.

[Series 2: axial (person_name) · axial · 0.70mm/px · z∈[+866,+1206]mm · 13 of 80 slices shown, 15 images]
[im 6/80  soft-tissue]
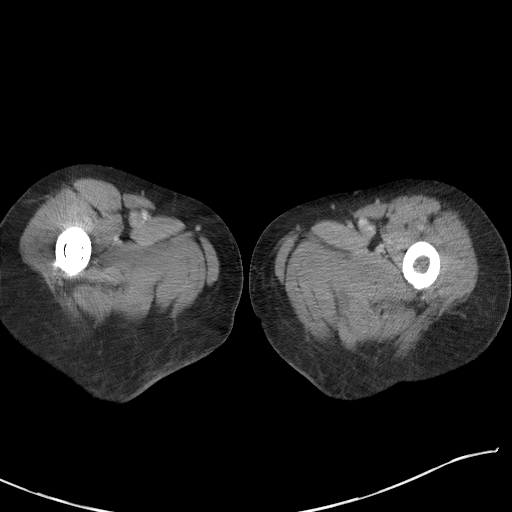
[im 6/80  bone]
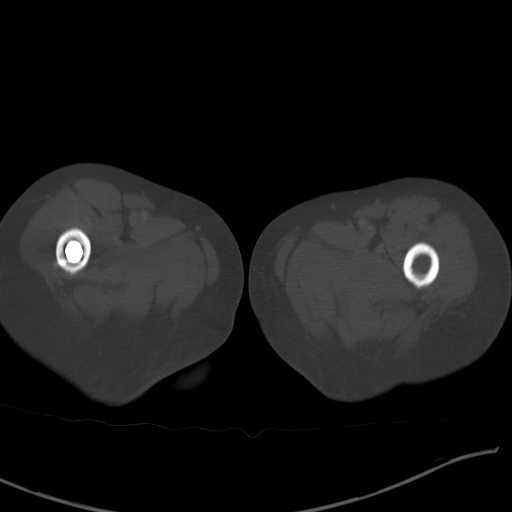
[im 11/80  soft-tissue]
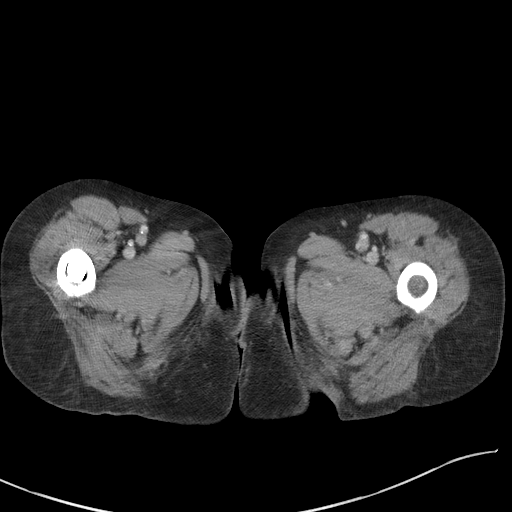
[im 16/80  soft-tissue]
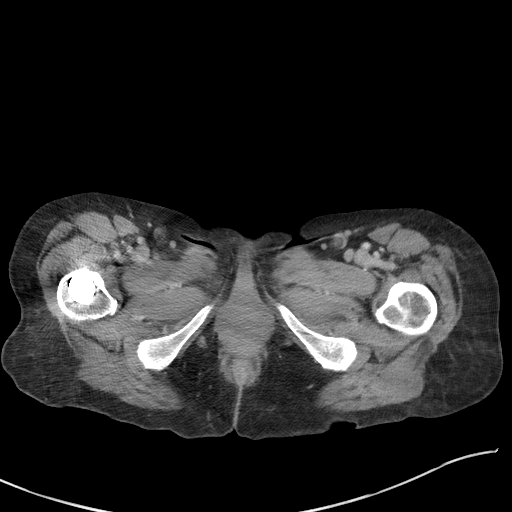
[im 22/80  soft-tissue]
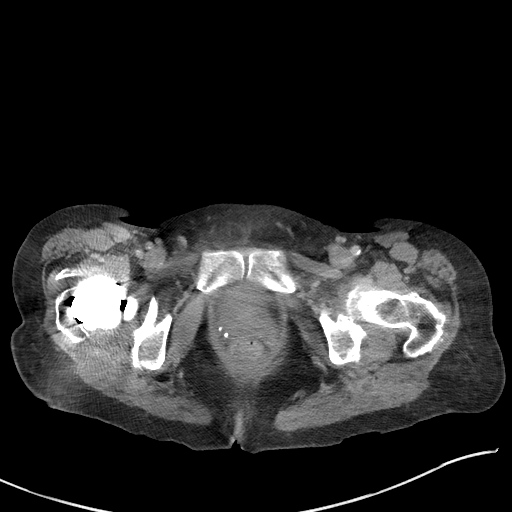
[im 27/80  soft-tissue]
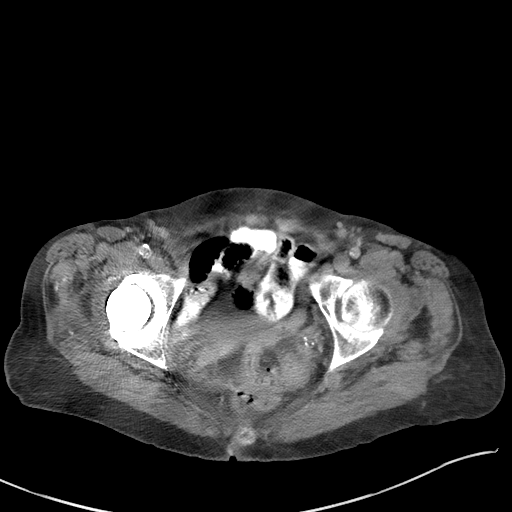
[im 32/80  soft-tissue]
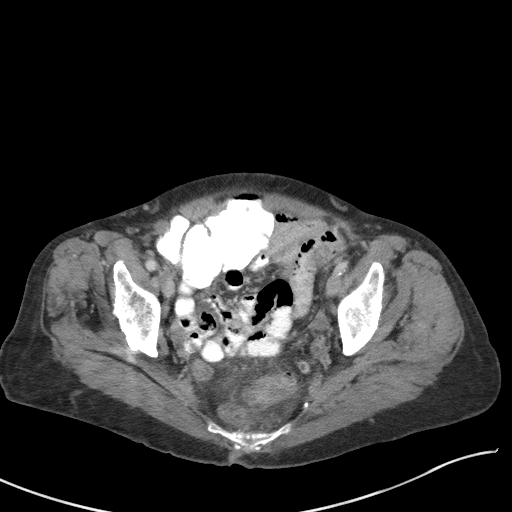
[im 43/80  soft-tissue]
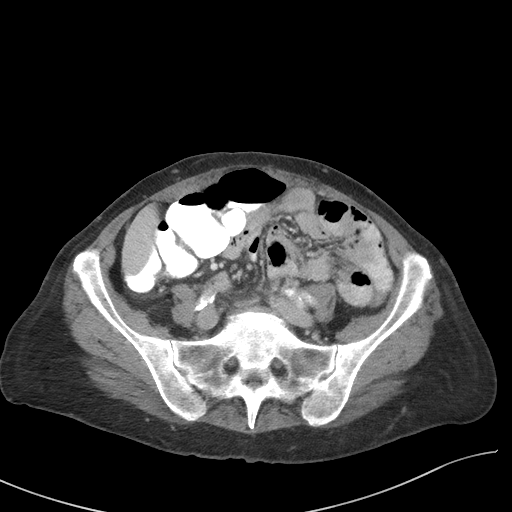
[im 48/80  soft-tissue]
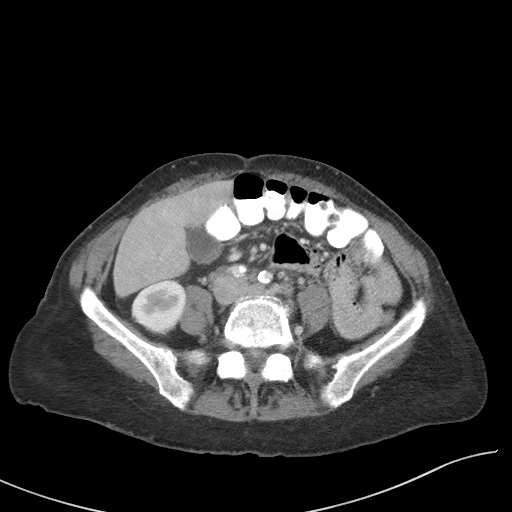
[im 53/80  soft-tissue]
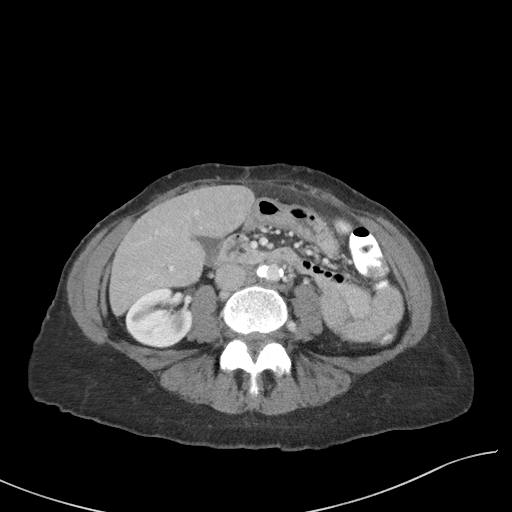
[im 53/80  bone]
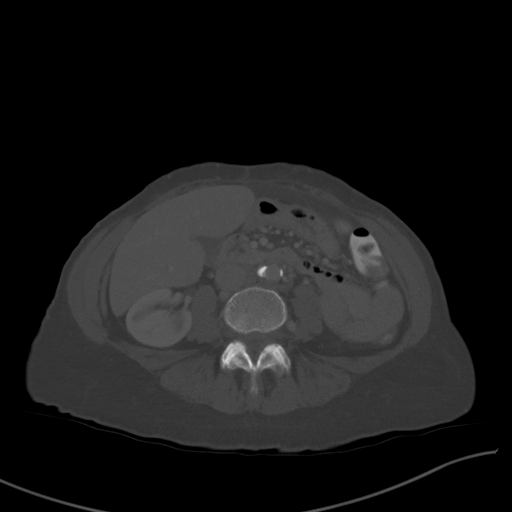
[im 58/80  soft-tissue]
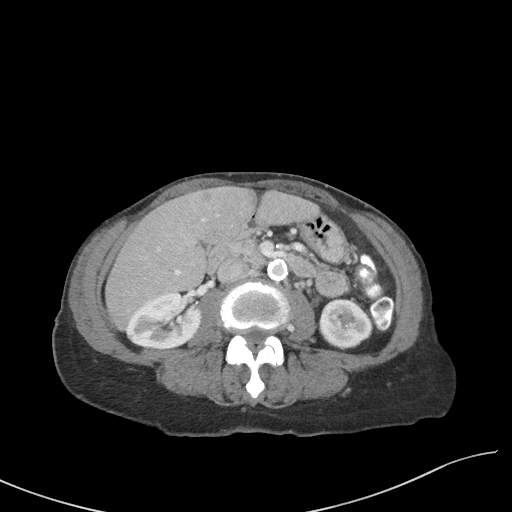
[im 64/80  soft-tissue]
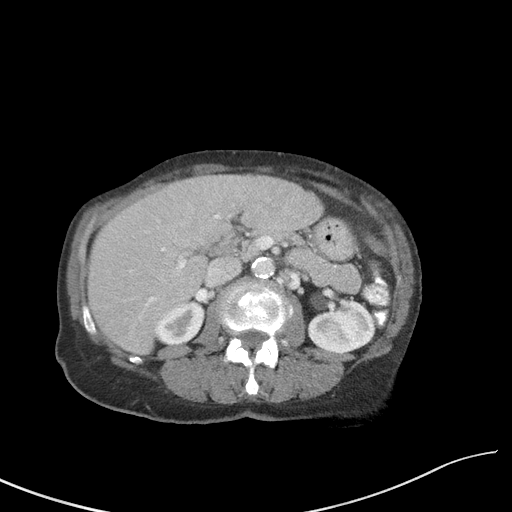
[im 69/80  soft-tissue]
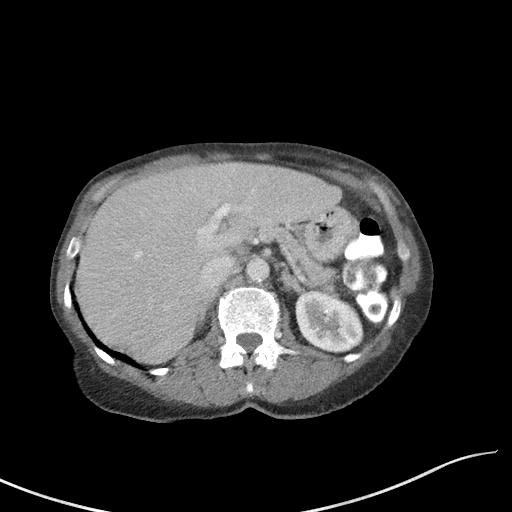
[im 74/80  soft-tissue]
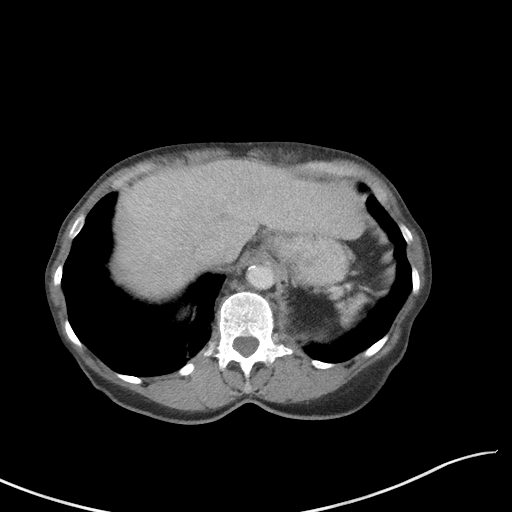

[Series 5: coronal (person_name) · coronal · 0.72mm/px · 3 of 81 slices shown]
[im 27/81  soft-tissue]
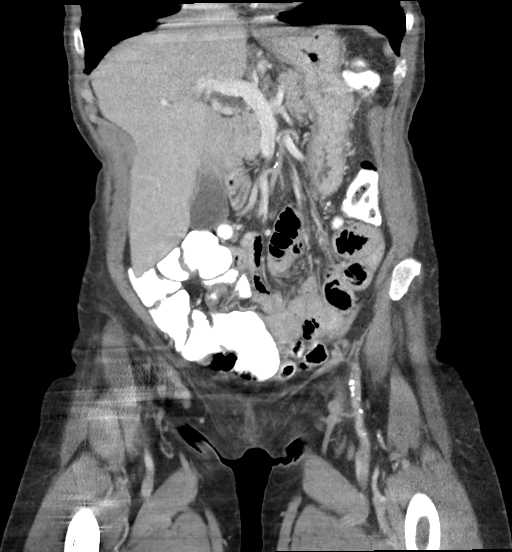
[im 36/81  soft-tissue]
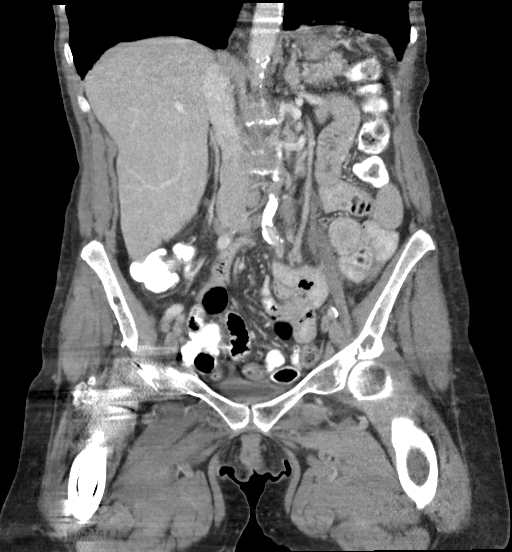
[im 45/81  soft-tissue]
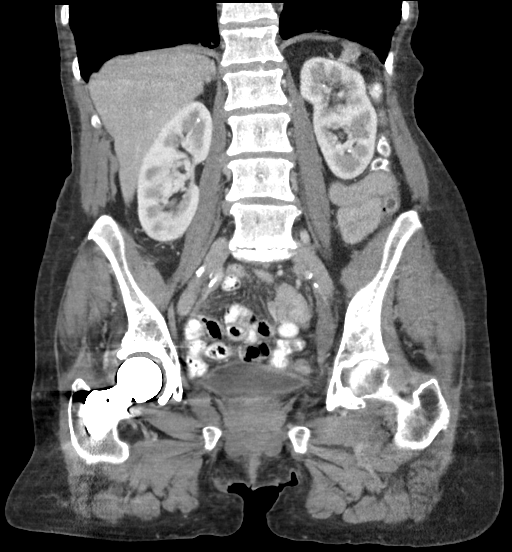

[16 of 46 positions shown; findings below may reference images not displayed]

RADIATION DOSE REDUCTION: This exam was performed according to the
departmental dose-optimization program which includes automated
exposure control, adjustment of the mA and/or kV according to
patient size and/or use of iterative reconstruction technique.

CONTRAST:  75mL OMNIPAQUE IOHEXOL 300 MG/ML  SOLN
FINDINGS: Lower chest: No acute abnormality.

Hepatobiliary: Unchanged 1.0 cm hypoattenuating, well-defined mass
in segment 4 B. the liver is normal in size, contour, and
attenuation. The gallbladder is present unremarkable. No intra or
extrahepatic biliary ductal dilation.

Pancreas: Unremarkable. No pancreatic ductal dilatation or
surrounding inflammatory changes.

Spleen: Unchanged appearance of focal splenosis status post
splenectomy.

Adrenals/Urinary Tract: Adrenal glands are unremarkable. Kidneys are
normal, without renal calculi, focal lesion, or hydronephrosis.
Bladder is mostly decompressed.

Stomach/Bowel: Stomach is within normal limits. Appendix is not
definitively identified. Again seen scattered descending and sigmoid
colonic diverticula. There is an ill-defined region of mural
thickening and mild pericolonic fat stranding about the rectosigmoid
junction, partially obscured by streak artifact from right hip
prosthesis.

Vascular/Lymphatic: Aortic atherosclerosis. No enlarged abdominal or
pelvic lymph nodes.

Reproductive: Status post hysterectomy. No adnexal masses.

Other: No abdominal wall hernia or abnormality. No abdominopelvic
ascites.

Musculoskeletal: Partially visualized right total hip arthroplasty
without complicating features. Mild multilevel degenerative changes
of the visualized thoracolumbar spine, unchanged. No acute osseous
abnormality.
IMPRESSION: 1. Possible acute, uncomplicated rectosigmoid diverticulitis.
Evaluation is partially limited by metallic streak artifact from
nearby right hip prosthesis.
2. No additional evidence of acute abdominopelvic abnormality to
explain presenting symptoms.
3. Unchanged 1.0 cm left hepatic mass with benign imaging
characteristics, favored represent hemangioma.

## 2022-10-12 DIAGNOSIS — I10 Essential (primary) hypertension: Secondary | ICD-10-CM | POA: Diagnosis not present

## 2022-10-12 DIAGNOSIS — I69359 Hemiplegia and hemiparesis following cerebral infarction affecting unspecified side: Secondary | ICD-10-CM | POA: Diagnosis not present

## 2022-10-12 DIAGNOSIS — M791 Myalgia, unspecified site: Secondary | ICD-10-CM | POA: Diagnosis not present

## 2022-10-12 DIAGNOSIS — F411 Generalized anxiety disorder: Secondary | ICD-10-CM | POA: Diagnosis not present

## 2022-10-12 DIAGNOSIS — E785 Hyperlipidemia, unspecified: Secondary | ICD-10-CM | POA: Diagnosis not present

## 2022-10-12 DIAGNOSIS — I69351 Hemiplegia and hemiparesis following cerebral infarction affecting right dominant side: Secondary | ICD-10-CM | POA: Diagnosis not present

## 2022-10-12 DIAGNOSIS — F321 Major depressive disorder, single episode, moderate: Secondary | ICD-10-CM | POA: Diagnosis not present

## 2022-10-12 DIAGNOSIS — R2689 Other abnormalities of gait and mobility: Secondary | ICD-10-CM | POA: Diagnosis not present

## 2022-10-12 DIAGNOSIS — I63412 Cerebral infarction due to embolism of left middle cerebral artery: Secondary | ICD-10-CM | POA: Diagnosis not present

## 2022-10-24 DIAGNOSIS — F411 Generalized anxiety disorder: Secondary | ICD-10-CM | POA: Diagnosis not present

## 2022-10-24 DIAGNOSIS — I10 Essential (primary) hypertension: Secondary | ICD-10-CM | POA: Diagnosis not present

## 2022-10-24 DIAGNOSIS — E785 Hyperlipidemia, unspecified: Secondary | ICD-10-CM | POA: Diagnosis not present

## 2022-10-24 DIAGNOSIS — R2689 Other abnormalities of gait and mobility: Secondary | ICD-10-CM | POA: Diagnosis not present

## 2022-10-24 DIAGNOSIS — C91Z0 Other lymphoid leukemia not having achieved remission: Secondary | ICD-10-CM | POA: Diagnosis not present

## 2022-10-24 DIAGNOSIS — E440 Moderate protein-calorie malnutrition: Secondary | ICD-10-CM | POA: Diagnosis not present

## 2022-10-24 DIAGNOSIS — F329 Major depressive disorder, single episode, unspecified: Secondary | ICD-10-CM | POA: Diagnosis not present

## 2022-10-24 DIAGNOSIS — I69953 Hemiplegia and hemiparesis following unspecified cerebrovascular disease affecting right non-dominant side: Secondary | ICD-10-CM | POA: Diagnosis not present

## 2022-10-24 DIAGNOSIS — M2550 Pain in unspecified joint: Secondary | ICD-10-CM | POA: Diagnosis not present

## 2022-10-26 DIAGNOSIS — C91Z0 Other lymphoid leukemia not having achieved remission: Secondary | ICD-10-CM | POA: Diagnosis not present

## 2022-10-26 DIAGNOSIS — E785 Hyperlipidemia, unspecified: Secondary | ICD-10-CM | POA: Diagnosis not present

## 2022-10-26 DIAGNOSIS — F411 Generalized anxiety disorder: Secondary | ICD-10-CM | POA: Diagnosis not present

## 2022-10-26 DIAGNOSIS — I69953 Hemiplegia and hemiparesis following unspecified cerebrovascular disease affecting right non-dominant side: Secondary | ICD-10-CM | POA: Diagnosis not present

## 2022-10-26 DIAGNOSIS — R2689 Other abnormalities of gait and mobility: Secondary | ICD-10-CM | POA: Diagnosis not present

## 2022-10-26 DIAGNOSIS — M2550 Pain in unspecified joint: Secondary | ICD-10-CM | POA: Diagnosis not present

## 2022-10-26 DIAGNOSIS — F329 Major depressive disorder, single episode, unspecified: Secondary | ICD-10-CM | POA: Diagnosis not present

## 2022-10-26 DIAGNOSIS — E440 Moderate protein-calorie malnutrition: Secondary | ICD-10-CM | POA: Diagnosis not present

## 2022-10-26 DIAGNOSIS — I10 Essential (primary) hypertension: Secondary | ICD-10-CM | POA: Diagnosis not present

## 2022-11-04 DIAGNOSIS — F411 Generalized anxiety disorder: Secondary | ICD-10-CM | POA: Diagnosis not present

## 2022-11-04 DIAGNOSIS — C91Z0 Other lymphoid leukemia not having achieved remission: Secondary | ICD-10-CM | POA: Diagnosis not present

## 2022-11-04 DIAGNOSIS — E440 Moderate protein-calorie malnutrition: Secondary | ICD-10-CM | POA: Diagnosis not present

## 2022-11-04 DIAGNOSIS — M2550 Pain in unspecified joint: Secondary | ICD-10-CM | POA: Diagnosis not present

## 2022-11-04 DIAGNOSIS — I69953 Hemiplegia and hemiparesis following unspecified cerebrovascular disease affecting right non-dominant side: Secondary | ICD-10-CM | POA: Diagnosis not present

## 2022-11-04 DIAGNOSIS — E785 Hyperlipidemia, unspecified: Secondary | ICD-10-CM | POA: Diagnosis not present

## 2022-11-04 DIAGNOSIS — F329 Major depressive disorder, single episode, unspecified: Secondary | ICD-10-CM | POA: Diagnosis not present

## 2022-11-04 DIAGNOSIS — I10 Essential (primary) hypertension: Secondary | ICD-10-CM | POA: Diagnosis not present

## 2022-11-04 DIAGNOSIS — R2689 Other abnormalities of gait and mobility: Secondary | ICD-10-CM | POA: Diagnosis not present

## 2022-11-06 DIAGNOSIS — I10 Essential (primary) hypertension: Secondary | ICD-10-CM | POA: Diagnosis not present

## 2022-11-06 DIAGNOSIS — M2550 Pain in unspecified joint: Secondary | ICD-10-CM | POA: Diagnosis not present

## 2022-11-06 DIAGNOSIS — F411 Generalized anxiety disorder: Secondary | ICD-10-CM | POA: Diagnosis not present

## 2022-11-06 DIAGNOSIS — F329 Major depressive disorder, single episode, unspecified: Secondary | ICD-10-CM | POA: Diagnosis not present

## 2022-11-06 DIAGNOSIS — C91Z0 Other lymphoid leukemia not having achieved remission: Secondary | ICD-10-CM | POA: Diagnosis not present

## 2022-11-06 DIAGNOSIS — I69953 Hemiplegia and hemiparesis following unspecified cerebrovascular disease affecting right non-dominant side: Secondary | ICD-10-CM | POA: Diagnosis not present

## 2022-11-06 DIAGNOSIS — E440 Moderate protein-calorie malnutrition: Secondary | ICD-10-CM | POA: Diagnosis not present

## 2022-11-06 DIAGNOSIS — E785 Hyperlipidemia, unspecified: Secondary | ICD-10-CM | POA: Diagnosis not present

## 2022-11-06 DIAGNOSIS — R2689 Other abnormalities of gait and mobility: Secondary | ICD-10-CM | POA: Diagnosis not present

## 2022-11-09 DIAGNOSIS — M2550 Pain in unspecified joint: Secondary | ICD-10-CM | POA: Diagnosis not present

## 2022-11-09 DIAGNOSIS — F329 Major depressive disorder, single episode, unspecified: Secondary | ICD-10-CM | POA: Diagnosis not present

## 2022-11-09 DIAGNOSIS — I69953 Hemiplegia and hemiparesis following unspecified cerebrovascular disease affecting right non-dominant side: Secondary | ICD-10-CM | POA: Diagnosis not present

## 2022-11-09 DIAGNOSIS — R2689 Other abnormalities of gait and mobility: Secondary | ICD-10-CM | POA: Diagnosis not present

## 2022-11-09 DIAGNOSIS — E785 Hyperlipidemia, unspecified: Secondary | ICD-10-CM | POA: Diagnosis not present

## 2022-11-09 DIAGNOSIS — F411 Generalized anxiety disorder: Secondary | ICD-10-CM | POA: Diagnosis not present

## 2022-11-09 DIAGNOSIS — I10 Essential (primary) hypertension: Secondary | ICD-10-CM | POA: Diagnosis not present

## 2022-11-09 DIAGNOSIS — C91Z0 Other lymphoid leukemia not having achieved remission: Secondary | ICD-10-CM | POA: Diagnosis not present

## 2022-11-09 DIAGNOSIS — E440 Moderate protein-calorie malnutrition: Secondary | ICD-10-CM | POA: Diagnosis not present

## 2022-11-14 DIAGNOSIS — F329 Major depressive disorder, single episode, unspecified: Secondary | ICD-10-CM | POA: Diagnosis not present

## 2022-11-14 DIAGNOSIS — M2550 Pain in unspecified joint: Secondary | ICD-10-CM | POA: Diagnosis not present

## 2022-11-14 DIAGNOSIS — I69953 Hemiplegia and hemiparesis following unspecified cerebrovascular disease affecting right non-dominant side: Secondary | ICD-10-CM | POA: Diagnosis not present

## 2022-11-14 DIAGNOSIS — R2689 Other abnormalities of gait and mobility: Secondary | ICD-10-CM | POA: Diagnosis not present

## 2022-11-14 DIAGNOSIS — C91Z0 Other lymphoid leukemia not having achieved remission: Secondary | ICD-10-CM | POA: Diagnosis not present

## 2022-11-14 DIAGNOSIS — I10 Essential (primary) hypertension: Secondary | ICD-10-CM | POA: Diagnosis not present

## 2022-11-14 DIAGNOSIS — E785 Hyperlipidemia, unspecified: Secondary | ICD-10-CM | POA: Diagnosis not present

## 2022-11-14 DIAGNOSIS — F411 Generalized anxiety disorder: Secondary | ICD-10-CM | POA: Diagnosis not present

## 2022-11-14 DIAGNOSIS — E440 Moderate protein-calorie malnutrition: Secondary | ICD-10-CM | POA: Diagnosis not present

## 2022-11-16 DIAGNOSIS — I10 Essential (primary) hypertension: Secondary | ICD-10-CM | POA: Diagnosis not present

## 2022-11-16 DIAGNOSIS — R2689 Other abnormalities of gait and mobility: Secondary | ICD-10-CM | POA: Diagnosis not present

## 2022-11-16 DIAGNOSIS — M2550 Pain in unspecified joint: Secondary | ICD-10-CM | POA: Diagnosis not present

## 2022-11-16 DIAGNOSIS — F411 Generalized anxiety disorder: Secondary | ICD-10-CM | POA: Diagnosis not present

## 2022-11-16 DIAGNOSIS — I69953 Hemiplegia and hemiparesis following unspecified cerebrovascular disease affecting right non-dominant side: Secondary | ICD-10-CM | POA: Diagnosis not present

## 2022-11-16 DIAGNOSIS — C91Z0 Other lymphoid leukemia not having achieved remission: Secondary | ICD-10-CM | POA: Diagnosis not present

## 2022-11-16 DIAGNOSIS — E440 Moderate protein-calorie malnutrition: Secondary | ICD-10-CM | POA: Diagnosis not present

## 2022-11-16 DIAGNOSIS — F329 Major depressive disorder, single episode, unspecified: Secondary | ICD-10-CM | POA: Diagnosis not present

## 2022-11-16 DIAGNOSIS — E785 Hyperlipidemia, unspecified: Secondary | ICD-10-CM | POA: Diagnosis not present

## 2022-11-21 DIAGNOSIS — E440 Moderate protein-calorie malnutrition: Secondary | ICD-10-CM | POA: Diagnosis not present

## 2022-11-21 DIAGNOSIS — E785 Hyperlipidemia, unspecified: Secondary | ICD-10-CM | POA: Diagnosis not present

## 2022-11-21 DIAGNOSIS — M2550 Pain in unspecified joint: Secondary | ICD-10-CM | POA: Diagnosis not present

## 2022-11-21 DIAGNOSIS — C91Z0 Other lymphoid leukemia not having achieved remission: Secondary | ICD-10-CM | POA: Diagnosis not present

## 2022-11-21 DIAGNOSIS — F411 Generalized anxiety disorder: Secondary | ICD-10-CM | POA: Diagnosis not present

## 2022-11-21 DIAGNOSIS — I10 Essential (primary) hypertension: Secondary | ICD-10-CM | POA: Diagnosis not present

## 2022-11-21 DIAGNOSIS — R2689 Other abnormalities of gait and mobility: Secondary | ICD-10-CM | POA: Diagnosis not present

## 2022-11-21 DIAGNOSIS — I69953 Hemiplegia and hemiparesis following unspecified cerebrovascular disease affecting right non-dominant side: Secondary | ICD-10-CM | POA: Diagnosis not present

## 2022-11-21 DIAGNOSIS — F329 Major depressive disorder, single episode, unspecified: Secondary | ICD-10-CM | POA: Diagnosis not present

## 2022-11-23 DIAGNOSIS — E785 Hyperlipidemia, unspecified: Secondary | ICD-10-CM | POA: Diagnosis not present

## 2022-11-23 DIAGNOSIS — R2689 Other abnormalities of gait and mobility: Secondary | ICD-10-CM | POA: Diagnosis not present

## 2022-11-23 DIAGNOSIS — E440 Moderate protein-calorie malnutrition: Secondary | ICD-10-CM | POA: Diagnosis not present

## 2022-11-23 DIAGNOSIS — I69953 Hemiplegia and hemiparesis following unspecified cerebrovascular disease affecting right non-dominant side: Secondary | ICD-10-CM | POA: Diagnosis not present

## 2022-11-23 DIAGNOSIS — F411 Generalized anxiety disorder: Secondary | ICD-10-CM | POA: Diagnosis not present

## 2022-11-23 DIAGNOSIS — E559 Vitamin D deficiency, unspecified: Secondary | ICD-10-CM | POA: Diagnosis not present

## 2022-11-23 DIAGNOSIS — I10 Essential (primary) hypertension: Secondary | ICD-10-CM | POA: Diagnosis not present

## 2022-11-23 DIAGNOSIS — C91Z0 Other lymphoid leukemia not having achieved remission: Secondary | ICD-10-CM | POA: Diagnosis not present

## 2022-11-23 DIAGNOSIS — F329 Major depressive disorder, single episode, unspecified: Secondary | ICD-10-CM | POA: Diagnosis not present

## 2022-11-23 DIAGNOSIS — M2550 Pain in unspecified joint: Secondary | ICD-10-CM | POA: Diagnosis not present

## 2022-11-23 DIAGNOSIS — E871 Hypo-osmolality and hyponatremia: Secondary | ICD-10-CM | POA: Diagnosis not present

## 2022-11-28 DIAGNOSIS — R2689 Other abnormalities of gait and mobility: Secondary | ICD-10-CM | POA: Diagnosis not present

## 2022-11-28 DIAGNOSIS — I63412 Cerebral infarction due to embolism of left middle cerebral artery: Secondary | ICD-10-CM | POA: Diagnosis not present

## 2022-11-28 DIAGNOSIS — F411 Generalized anxiety disorder: Secondary | ICD-10-CM | POA: Diagnosis not present

## 2022-11-28 DIAGNOSIS — F329 Major depressive disorder, single episode, unspecified: Secondary | ICD-10-CM | POA: Diagnosis not present

## 2022-11-28 DIAGNOSIS — M2550 Pain in unspecified joint: Secondary | ICD-10-CM | POA: Diagnosis not present

## 2022-11-28 DIAGNOSIS — F17210 Nicotine dependence, cigarettes, uncomplicated: Secondary | ICD-10-CM | POA: Diagnosis not present

## 2022-11-28 DIAGNOSIS — J449 Chronic obstructive pulmonary disease, unspecified: Secondary | ICD-10-CM | POA: Diagnosis not present

## 2022-11-28 DIAGNOSIS — E440 Moderate protein-calorie malnutrition: Secondary | ICD-10-CM | POA: Diagnosis not present

## 2022-11-28 DIAGNOSIS — C91Z0 Other lymphoid leukemia not having achieved remission: Secondary | ICD-10-CM | POA: Diagnosis not present

## 2022-11-28 DIAGNOSIS — I69953 Hemiplegia and hemiparesis following unspecified cerebrovascular disease affecting right non-dominant side: Secondary | ICD-10-CM | POA: Diagnosis not present

## 2022-11-28 DIAGNOSIS — E785 Hyperlipidemia, unspecified: Secondary | ICD-10-CM | POA: Diagnosis not present

## 2022-11-28 DIAGNOSIS — I10 Essential (primary) hypertension: Secondary | ICD-10-CM | POA: Diagnosis not present

## 2022-11-28 DIAGNOSIS — I69359 Hemiplegia and hemiparesis following cerebral infarction affecting unspecified side: Secondary | ICD-10-CM | POA: Diagnosis not present

## 2022-12-05 DIAGNOSIS — E785 Hyperlipidemia, unspecified: Secondary | ICD-10-CM | POA: Diagnosis not present

## 2022-12-05 DIAGNOSIS — M2550 Pain in unspecified joint: Secondary | ICD-10-CM | POA: Diagnosis not present

## 2022-12-05 DIAGNOSIS — R2689 Other abnormalities of gait and mobility: Secondary | ICD-10-CM | POA: Diagnosis not present

## 2022-12-05 DIAGNOSIS — E440 Moderate protein-calorie malnutrition: Secondary | ICD-10-CM | POA: Diagnosis not present

## 2022-12-05 DIAGNOSIS — F411 Generalized anxiety disorder: Secondary | ICD-10-CM | POA: Diagnosis not present

## 2022-12-05 DIAGNOSIS — I10 Essential (primary) hypertension: Secondary | ICD-10-CM | POA: Diagnosis not present

## 2022-12-05 DIAGNOSIS — C91Z0 Other lymphoid leukemia not having achieved remission: Secondary | ICD-10-CM | POA: Diagnosis not present

## 2022-12-05 DIAGNOSIS — F329 Major depressive disorder, single episode, unspecified: Secondary | ICD-10-CM | POA: Diagnosis not present

## 2022-12-05 DIAGNOSIS — I69953 Hemiplegia and hemiparesis following unspecified cerebrovascular disease affecting right non-dominant side: Secondary | ICD-10-CM | POA: Diagnosis not present

## 2022-12-06 ENCOUNTER — Encounter: Payer: Self-pay | Admitting: Internal Medicine

## 2022-12-08 DIAGNOSIS — J449 Chronic obstructive pulmonary disease, unspecified: Secondary | ICD-10-CM | POA: Diagnosis not present

## 2022-12-12 DIAGNOSIS — I10 Essential (primary) hypertension: Secondary | ICD-10-CM | POA: Diagnosis not present

## 2022-12-12 DIAGNOSIS — F411 Generalized anxiety disorder: Secondary | ICD-10-CM | POA: Diagnosis not present

## 2022-12-12 DIAGNOSIS — C91Z0 Other lymphoid leukemia not having achieved remission: Secondary | ICD-10-CM | POA: Diagnosis not present

## 2022-12-12 DIAGNOSIS — F329 Major depressive disorder, single episode, unspecified: Secondary | ICD-10-CM | POA: Diagnosis not present

## 2022-12-12 DIAGNOSIS — E440 Moderate protein-calorie malnutrition: Secondary | ICD-10-CM | POA: Diagnosis not present

## 2022-12-12 DIAGNOSIS — R2689 Other abnormalities of gait and mobility: Secondary | ICD-10-CM | POA: Diagnosis not present

## 2022-12-12 DIAGNOSIS — E785 Hyperlipidemia, unspecified: Secondary | ICD-10-CM | POA: Diagnosis not present

## 2022-12-12 DIAGNOSIS — M2550 Pain in unspecified joint: Secondary | ICD-10-CM | POA: Diagnosis not present

## 2022-12-12 DIAGNOSIS — I69953 Hemiplegia and hemiparesis following unspecified cerebrovascular disease affecting right non-dominant side: Secondary | ICD-10-CM | POA: Diagnosis not present

## 2022-12-18 ENCOUNTER — Ambulatory Visit: Payer: Medicare HMO | Admitting: Family Medicine

## 2022-12-29 ENCOUNTER — Inpatient Hospital Stay: Payer: Medicare HMO | Attending: Hematology

## 2022-12-29 ENCOUNTER — Inpatient Hospital Stay: Payer: Medicare HMO

## 2022-12-29 DIAGNOSIS — Z79899 Other long term (current) drug therapy: Secondary | ICD-10-CM | POA: Diagnosis not present

## 2022-12-29 DIAGNOSIS — F17210 Nicotine dependence, cigarettes, uncomplicated: Secondary | ICD-10-CM | POA: Diagnosis not present

## 2022-12-29 DIAGNOSIS — K219 Gastro-esophageal reflux disease without esophagitis: Secondary | ICD-10-CM | POA: Diagnosis not present

## 2022-12-29 DIAGNOSIS — C91Z0 Other lymphoid leukemia not having achieved remission: Secondary | ICD-10-CM | POA: Insufficient documentation

## 2022-12-29 DIAGNOSIS — J449 Chronic obstructive pulmonary disease, unspecified: Secondary | ICD-10-CM | POA: Diagnosis not present

## 2022-12-29 DIAGNOSIS — R110 Nausea: Secondary | ICD-10-CM | POA: Diagnosis not present

## 2022-12-29 DIAGNOSIS — E785 Hyperlipidemia, unspecified: Secondary | ICD-10-CM | POA: Insufficient documentation

## 2022-12-29 DIAGNOSIS — M069 Rheumatoid arthritis, unspecified: Secondary | ICD-10-CM | POA: Insufficient documentation

## 2022-12-29 DIAGNOSIS — Z9081 Acquired absence of spleen: Secondary | ICD-10-CM | POA: Insufficient documentation

## 2022-12-29 DIAGNOSIS — E871 Hypo-osmolality and hyponatremia: Secondary | ICD-10-CM | POA: Diagnosis not present

## 2022-12-29 DIAGNOSIS — Z7982 Long term (current) use of aspirin: Secondary | ICD-10-CM | POA: Diagnosis not present

## 2022-12-29 DIAGNOSIS — R42 Dizziness and giddiness: Secondary | ICD-10-CM | POA: Diagnosis not present

## 2022-12-29 LAB — COMPREHENSIVE METABOLIC PANEL
ALT: 22 U/L (ref 0–44)
AST: 20 U/L (ref 15–41)
Albumin: 3.2 g/dL — ABNORMAL LOW (ref 3.5–5.0)
Alkaline Phosphatase: 84 U/L (ref 38–126)
Anion gap: 7 (ref 5–15)
BUN: 13 mg/dL (ref 8–23)
CO2: 25 mmol/L (ref 22–32)
Calcium: 9.2 mg/dL (ref 8.9–10.3)
Chloride: 95 mmol/L — ABNORMAL LOW (ref 98–111)
Creatinine, Ser: 0.65 mg/dL (ref 0.44–1.00)
GFR, Estimated: >60 mL/min (ref >60)
Glucose, Bld: 111 mg/dL — ABNORMAL HIGH (ref 70–99)
Potassium: 4.5 mmol/L (ref 3.5–5.1)
Sodium: 127 mmol/L — ABNORMAL LOW (ref 135–145)
Total Bilirubin: 0.5 mg/dL (ref 0.3–1.2)
Total Protein: 8 g/dL (ref 6.5–8.1)

## 2022-12-29 LAB — CBC WITH DIFFERENTIAL/PLATELET
Abs Immature Granulocytes: 0 10*3/uL (ref 0.00–0.07)
Basophils Absolute: 0.2 10*3/uL — ABNORMAL HIGH (ref 0.0–0.1)
Basophils Relative: 5 %
Eosinophils Absolute: 0 10*3/uL (ref 0.0–0.5)
Eosinophils Relative: 0 %
HCT: 38.3 % (ref 36.0–46.0)
Hemoglobin: 13.2 g/dL (ref 12.0–15.0)
Immature Granulocytes: 0 %
Lymphocytes Relative: 91 %
Lymphs Abs: 3.5 10*3/uL (ref 0.7–4.0)
MCH: 34.1 pg — ABNORMAL HIGH (ref 26.0–34.0)
MCHC: 34.5 g/dL (ref 30.0–36.0)
MCV: 99 fL (ref 80.0–100.0)
Monocytes Absolute: 0.2 10*3/uL (ref 0.1–1.0)
Monocytes Relative: 4 %
Neutro Abs: 0 10*3/uL — CL (ref 1.7–7.7)
Neutrophils Relative %: 0 %
Platelets: 334 10*3/uL (ref 150–400)
RBC: 3.87 MIL/uL (ref 3.87–5.11)
RDW: 15.9 % — ABNORMAL HIGH (ref 11.5–15.5)
WBC: 3.9 10*3/uL — ABNORMAL LOW (ref 4.0–10.5)
nRBC: 0 % (ref 0.0–0.2)

## 2022-12-29 LAB — LACTATE DEHYDROGENASE: LDH: 128 U/L (ref 98–192)

## 2022-12-29 NOTE — Progress Notes (Unsigned)
CRITICAL VALUE ALERT Critical value received:  ANC 0.0 Date of notification:  12-29-22 Time of notification: 1045 Critical value read back:  Yes.   Nurse who received alert:  C. PageRN MD notified time and response:  1053, Dr. Anders Simmonds. No orders

## 2023-01-05 ENCOUNTER — Inpatient Hospital Stay: Payer: Medicare HMO

## 2023-01-05 ENCOUNTER — Inpatient Hospital Stay: Payer: Medicare HMO | Admitting: Oncology

## 2023-01-05 VITALS — BP 105/52 | HR 95 | Temp 98.2°F | Resp 20 | Wt 96.8 lb

## 2023-01-05 DIAGNOSIS — R42 Dizziness and giddiness: Secondary | ICD-10-CM | POA: Diagnosis not present

## 2023-01-05 DIAGNOSIS — E871 Hypo-osmolality and hyponatremia: Secondary | ICD-10-CM | POA: Diagnosis not present

## 2023-01-05 DIAGNOSIS — R110 Nausea: Secondary | ICD-10-CM

## 2023-01-05 DIAGNOSIS — F17210 Nicotine dependence, cigarettes, uncomplicated: Secondary | ICD-10-CM | POA: Diagnosis not present

## 2023-01-05 DIAGNOSIS — K219 Gastro-esophageal reflux disease without esophagitis: Secondary | ICD-10-CM | POA: Diagnosis not present

## 2023-01-05 DIAGNOSIS — E785 Hyperlipidemia, unspecified: Secondary | ICD-10-CM | POA: Diagnosis not present

## 2023-01-05 DIAGNOSIS — C91Z0 Other lymphoid leukemia not having achieved remission: Secondary | ICD-10-CM | POA: Diagnosis not present

## 2023-01-05 DIAGNOSIS — M069 Rheumatoid arthritis, unspecified: Secondary | ICD-10-CM | POA: Diagnosis not present

## 2023-01-05 DIAGNOSIS — J449 Chronic obstructive pulmonary disease, unspecified: Secondary | ICD-10-CM | POA: Diagnosis not present

## 2023-01-05 DIAGNOSIS — R051 Acute cough: Secondary | ICD-10-CM | POA: Diagnosis not present

## 2023-01-05 MED ORDER — SODIUM CHLORIDE 0.9 % IV SOLN: INTRAVENOUS | Status: DC

## 2023-01-05 MED ORDER — ONDANSETRON 4 MG PO TBDP: 4.0000 mg | ORAL_TABLET | Freq: Three times a day (TID) | ORAL | 0 refills | Status: AC | PRN

## 2023-01-05 NOTE — Progress Notes (Signed)
Hydration fluids give per Durenda Hurt NP. Patient tolerated it well without problems. Vitals stable and discharged home from clinic via wheelchair. Follow up as scheduled.'

## 2023-01-05 NOTE — Progress Notes (Unsigned)
[facility] [address], [place] [address]   CLINIC:  Medical Oncology/Hematology  PCP:  Benita Stabile, MD [address] [place] [address] [phone]  REASON FOR VISIT:  Follow-up for large granular lymphocytic leukemia  PRIOR THERAPY: Splenectomy in 1970s  CURRENT THERAPY: Surveillance  INTERVAL HISTORY:   Sheri Pope 82 y.o. 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  She was last seen in clinic on 06/29/2022.  She denies any recent hospitalizations, surgeries or changes in baseline health.  Patient has refused treatment for her leukemia over the past several years.  Has declined methotrexate plus prednisone secondary to side effect profile.  She remains neutropenic.  She presents today with her daughter.  Reports extreme fatigue with little to no energy.  Appetite is 50%.  Weight is stable.  States she has been having low-grade fevers around 100.4 each day and pretty severe nausea.  Feels very weak and dizzy.  Reports shortness of breath with very little activity.  Has history of COPD.  Appetite is fair.  She has been drinking a boost or Ensure daily.  Having nausea almost daily.  Has been using ondansetron ODT for nausea.  Denies any recent antibiotics. She denies any new lumps or bumps.    She has no energy and 50% appetite. She endorses that she is maintaining a stable weight.  ASSESSMENT & PLAN:  1.  Large granular lymphocytic leukemia with severe neutropenia -CBC on 10/13/2019 shows white count 13.5, with differential showing 2% neutrophils, 94% lymphocytes, 3% monocytes and 1% basophils.  Hemoglobin and platelet count were normal. - History of splenectomy at age 67 secondary to a cyst on it. - CT CAP on 11/06/2019 showed a borderline prominent right axillary lymph node, 1.4 cm right upper normal-sized left axillary and right subpectoral lymph nodes with no overtly pathologic adenopathy.  Mild chronic diffuse  prominence of thyroid gland without discrete nodule. - T-cell receptor gene rearrangement showed clonal T-cell gamma population as well as clonal T-cell beta population detected.  This usually reflects presence of T-cell lymphocytic neoplasm. - Bone marrow biopsy on 08/11/2020 shows normocellular bone marrow for age, moderate involvement by interstitial T-cell infiltrates.  This favors large granular lymphocytic leukemia. 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  This can be detected in 30 to 40% cases of T-cell LGL leukemia.  Polytypic plasma cells about 10% were seen.  Chromosome analysis was 63, XX. - CTAP on 06/02/2021 with no evidence of abdominal or pelvic adenopathy. - She reports intermittent fevers with Tmax 100.4 F that will "come and go" on their own  2.  Weight loss: - Weight is stable.  -She continues Ensure/boost daily.  3.  Rheumatoid arthritis: - She has rheumatoid arthritis of the extremities, predominantly in the upper extremities with rheumatoid nodules. - Uses Voltaren and aspirin cream for anti-inflammatories. - She takes hydrocodone and methocarbamol as needed for analgesia.  4.  Social/family history: - She lives at home with her nephew.  She did office work prior to retirement. - She is a current active smoker, 1 pack/day for 50+ years. - Brother had cancer, type unknown.  PLAN: 1. Large granular lymphocytic leukemia (HCC) - She denies any unintentional weight loss, night sweats, or lymphadenopathy/masses.  Appetite has been stable. - No palpable lymphadenopathy on exam. - Labs from 12/29/2022 show white blood cell count of 3.9 with ANC of 0.0.  Otherwise CBC is grossly normal.  LFTs and LDH are normal as  well.  Sodium level is low at 127 but otherwise CMP unremarkable. -Reports she is interested in discussing treatment options with Dr. Ellin Saba given how poorly she feels. -She reports poor tolerance to all medications and is concerned about the side effect profile to  methotrexate and prefers not to take steroids.  -Reached out to Dr. Ellin Saba who recommends infection workup given intermittent fevers and have her return to clinic to discuss treatment options.  Aside from methotrexate and prednisone other options would include cyclophosphamide or cyclosporine daily.   -I am going to have her complete a chest x-ray to rule out acute infection given shortness of breath and increasing coughing.  She is afebrile at today's visit. -We discussed having her return to clinic in the next couple of weeks to discuss options.  We also discussed referral to palliative care for symptom management. -I am going to attach some of the drug options and have it mailed to her home prior to her next appointment Dr. Ellin Saba in the next few weeks.  -2. Hyponatremia -Sodium level 127.  -We discussed IV fluids while in clinic today.  Continue boost and Ensure.  3.  Intermittent fevers/cough: -Afebrile while in clinic today. -Chest x-ray-will call with results. -Also recommend urinalysis. -Will call with results.  4.  Nausea: -Zofran ODT appears to be helping.  Will refill prescription.  PLAN SUMMARY: >> RTC in the next couple of weeks to discuss possible treatment options with Dr. Ellin Saba. >> Palliative care consult placed. >> IV fluids today. >> Refill Zofran ODT. >> Chest x-ray to rule out lung infection given intermittent fevers. >> Will send information via mail for her to review prior to her next visit with Dr. Ellin Saba to discuss other treatment options.      REVIEW OF SYSTEMS:   Review of Systems  Constitutional:  Positive for fatigue and fever. Negative for appetite change, chills, diaphoresis and unexpected weight change.  HENT:   Negative for lump/mass and nosebleeds.   Eyes:  Negative for eye problems.  Respiratory:  Positive for cough and shortness of breath. Negative for hemoptysis.   Cardiovascular:  Negative for chest pain, leg swelling and  palpitations.  Gastrointestinal:  Positive for nausea. Negative for abdominal pain, blood in stool, constipation, diarrhea and vomiting.  Endocrine: Negative for hot flashes.  Genitourinary:  Negative for hematuria.   Musculoskeletal:  Positive for arthralgias.  Skin: Negative.   Neurological:  Positive for extremity weakness. Negative for dizziness, headaches and light-headedness.  Hematological:  Does not bruise/bleed easily.  Psychiatric/Behavioral:  Negative for sleep disturbance.      PHYSICAL EXAM:  ECOG PERFORMANCE STATUS: 1 - Symptomatic but completely ambulatory  There were no vitals filed for this visit. There were no vitals filed for this visit. Physical Exam Constitutional:      Appearance: Normal appearance. She is cachectic. She is ill-appearing.  Cardiovascular:     Rate and Rhythm: Normal rate and regular rhythm.  Pulmonary:     Effort: Pulmonary effort is normal. Tachypnea and prolonged expiration present.     Breath sounds: Decreased air movement present. Decreased breath sounds and wheezing present. No rhonchi.     Comments: Wheezing throughout Abdominal:     General: Bowel sounds are normal.     Palpations: Abdomen is soft.  Musculoskeletal:        General: No swelling. Normal range of motion.  Neurological:     Mental Status: She is alert and oriented to person, place, and time. Mental status is  at baseline.     PAST MEDICAL/SURGICAL HISTORY:  Past Medical History:  Diagnosis Date   Arthritis    RA AND OA --PAIN AND SWELLIN IN LEFT KNEE   Blood in urine    NEGATIVE UROLOGY WORK UP --BUT ALWAYS HAS BLOOD IN URINE   GERD (gastroesophageal reflux disease)    H/O hiatal hernia    Hyperlipidemia    Leukemia (HCC)    PONV (postoperative nausea and vomiting)    ALWAYS SICK AFTER SURGERIES EXCEPT AFTER HIP REPLACMENT 2007   Vitamin D deficiency    Past Surgical History:  Procedure Laterality Date   ABDOMINAL HYSTERECTOMY  1870'S   BREAST SURGERY      BREAST BIOSPIES   CATARACT EXTRACTION W/PHACO Left 05/30/2012   Procedure: CATARACT EXTRACTION PHACO AND INTRAOCULAR LENS PLACEMENT (IOC);  Surgeon: Gemma Payor, MD;  Location: [facility];  Service: Ophthalmology;  Laterality: Left;  CDE: 16.99   CATARACT EXTRACTION W/PHACO Right 06/24/2012   Procedure: CATARACT EXTRACTION PHACO AND INTRAOCULAR LENS PLACEMENT (IOC);  Surgeon: Gemma Payor, MD;  Location: [facility];  Service: Ophthalmology;  Laterality: Right;  CDE:17.92   COLONOSCOPY WITH PROPOFOL N/A 10/14/2020   Procedure: COLONOSCOPY WITH PROPOFOL;  Surgeon: Corbin Ade, MD;  Location: [facility];  Service: Endoscopy;  Laterality: N/A;  ASA III / 12:30   ESOPHAGOGASTRODUODENOSCOPY (EGD) WITH PROPOFOL N/A 10/14/2020   Procedure: ESOPHAGOGASTRODUODENOSCOPY (EGD) WITH PROPOFOL;  Surgeon: Corbin Ade, MD;  Location: [facility];  Service: Endoscopy;  Laterality: N/A;   HIP SURGERY     JOINT REPLACEMENT  2007   RIGHT HIP REPLACEMENT   LEFT KNEE ARTHROSCOPY  AUG 2012   MALONEY DILATION  10/14/2020   Procedure: MALONEY DILATION;  Surgeon: Corbin Ade, MD;  Location: [facility];  Service: Endoscopy;;   MULTIPLE ORTHOPEDIC SURGERIES     ON BOTH HANDS AND BOTH FEET, RIGHT ELBOW   SPLEENECTOMY  IN THE 70'S   FOR LARGE CYST   TOTAL KNEE ARTHROPLASTY  11/10/2011   Procedure: TOTAL KNEE ARTHROPLASTY;  Surgeon: Kathryne Hitch, MD;  Location: [facility];  Service: Orthopedics;  Laterality: Left;  Left Total Knee Arthroplasty   YAG LASER APPLICATION Bilateral    Dr. Alto Denver    SOCIAL HISTORY:  Social History   Socioeconomic History   Marital status: Widowed    Spouse name: Not on file   Number of children: Not on file   Years of education: Not on file   Highest education level: Not on file  Occupational History   Occupation: retired  Tobacco Use   Smoking status: Every Day    Current packs/day: 1.00    Average packs/day: 1 pack/day for 50.0 years (50.0 ttl pk-yrs)    Types: Cigarettes    Smokeless tobacco: Never  Vaping Use   Vaping status: Never Used  Substance and Sexual Activity   Alcohol use: No   Drug use: No   Sexual activity: Not Currently    Birth control/protection: Surgical  Other Topics Concern   Not on file  Social History Narrative   Not on file   Social Determinants of Health   Financial Resource Strain: Not on file  Food Insecurity: No Food Insecurity (03/14/2022)   Hunger Vital Sign    Worried About Running Out of Food in the Last Year: Never true    Ran Out of Food in the Last Year: Never true  Transportation Needs: No Transportation Needs (03/14/2022)   PRAPARE - Transportation  Lack of Transportation (Medical): No    Lack of Transportation (Non-Medical): No  Physical Activity: Not on file  Stress: Not on file  Social Connections: Not on file  Intimate Partner Violence: Not At Risk (03/14/2022)   Humiliation, Afraid, Rape, and Kick questionnaire    Fear of Current or Ex-Partner: No    Emotionally Abused: No    Physically Abused: No    Sexually Abused: No    FAMILY HISTORY:  Family History  Problem Relation Age of Onset   Diabetes Sister    Macular degeneration Sister    Heart attack Mother    Stroke Father    Dementia Father    Cancer Brother    Healthy Sister    Healthy Sister    Diabetes Brother     CURRENT MEDICATIONS:  Outpatient Encounter Medications as of 01/05/2023  Medication Sig Note   acetaminophen (TYLENOL) 325 MG tablet Take 2 tablets (650 mg total) by mouth every 6 (six) hours as needed for mild pain (or Fever >/= 101).    ALPRAZolam (XANAX) 0.25 MG tablet Take 1 tablet (0.25 mg total) by mouth at bedtime.    aspirin 81 MG chewable tablet Chew 1 tablet (81 mg total) by mouth daily.    HYDROcodone-acetaminophen (NORCO/VICODIN) 5-325 MG tablet Take 0.5 tablets by mouth 2 (two) times daily as needed for moderate pain. 05/16/2022: Didn't bring bottle, LD 05/16/22   losartan (COZAAR) 25 MG tablet Take 25 mg by mouth  daily.    methocarbamol (ROBAXIN) 500 MG tablet Take 1 tablet (500 mg total) by mouth 2 (two) times daily.    pravastatin (PRAVACHOL) 10 MG tablet Take 10 mg by mouth daily.    No facility-administered encounter medications on file as of 01/05/2023.    ALLERGIES:  Allergies  Allergen Reactions   Codeine Nausea And Vomiting   Prednisone Other (See Comments)    Thought she was having a heart attack   Sulfa Antibiotics Other (See Comments)    unknown   Aleve [Naproxen] Anxiety    Jittery and hot flashes   Latex Rash    LABORATORY DATA:  I have reviewed the labs as listed.  CBC    Component Value Date/Time   WBC 3.9 (L) 12/29/2022 0832   RBC 3.87 12/29/2022 0832   HGB 13.2 12/29/2022 0832   HGB 14.3 05/31/2021 1118   HCT 38.3 12/29/2022 0832   HCT 40.5 05/31/2021 1118   PLT 334 12/29/2022 0832   PLT 287 05/31/2021 1118   MCV 99.0 12/29/2022 0832   MCV 94 05/31/2021 1118   MCH 34.1 (H) 12/29/2022 0832   MCHC 34.5 12/29/2022 0832   RDW 15.9 (H) 12/29/2022 0832   RDW 14.7 05/31/2021 1118   LYMPHSABS 3.5 12/29/2022 0832   LYMPHSABS 10.6 (H) 05/31/2021 1118   MONOABS 0.2 12/29/2022 0832   EOSABS 0.0 12/29/2022 0832   EOSABS 0.0 05/31/2021 1118   BASOSABS 0.2 (H) 12/29/2022 0832   BASOSABS 0.0 05/31/2021 1118      Latest Ref Rng & Units 12/29/2022    8:32 AM 06/29/2022    8:31 AM 03/24/2022    6:02 AM  CMP  Glucose 70 - 99 mg/dL 161  96  96   BUN 8 - 23 mg/dL 13  12  10    Creatinine 0.44 - 1.00 mg/dL 0.96  0.45  4.09   Sodium 135 - 145 mmol/L 127  130  131   Potassium 3.5 - 5.1 mmol/L 4.5  4.2  3.9  Chloride 98 - 111 mmol/L 95  97  98   CO2 22 - 32 mmol/L 25  24  23    Calcium 8.9 - 10.3 mg/dL 9.2  9.4  9.2   Total Protein 6.5 - 8.1 g/dL 8.0  8.6    Total Bilirubin 0.3 - 1.2 mg/dL 0.5  0.6    Alkaline Phos 38 - 126 U/L 84  113    AST 15 - 41 U/L 20  20    ALT 0 - 44 U/L 22  20      DIAGNOSTIC IMAGING:  I have independently reviewed the relevant imaging and  discussed with the patient.   WRAP UP:  All questions were answered. The patient knows to call the clinic with any problems, questions or concerns.  Medical decision making: Moderate  Time spent on visit: I spent 30 minutes counseling the patient face to face. The total time spent in the appointment was 30 minutes and more than 50% was on counseling.  Mauro Kaufmann, NP  01/05/23 8:10 AM

## 2023-01-06 ENCOUNTER — Ambulatory Visit (HOSPITAL_COMMUNITY)
Admission: RE | Admit: 2023-01-06 | Discharge: 2023-01-06 | Disposition: A | Discharge status: Home / Self Care | Payer: Medicare HMO | Source: Ambulatory Visit | Attending: Oncology | Admitting: Oncology

## 2023-01-06 DIAGNOSIS — R918 Other nonspecific abnormal finding of lung field: Secondary | ICD-10-CM | POA: Diagnosis not present

## 2023-01-06 DIAGNOSIS — R051 Acute cough: Secondary | ICD-10-CM | POA: Diagnosis not present

## 2023-01-06 DIAGNOSIS — I700 Atherosclerosis of aorta: Secondary | ICD-10-CM | POA: Diagnosis not present

## 2023-01-06 DIAGNOSIS — R0602 Shortness of breath: Secondary | ICD-10-CM | POA: Diagnosis not present

## 2023-01-17 ENCOUNTER — Other Ambulatory Visit: Payer: Self-pay

## 2023-01-17 ENCOUNTER — Other Ambulatory Visit (HOSPITAL_COMMUNITY): Payer: Self-pay | Admitting: Family Medicine

## 2023-01-17 ENCOUNTER — Inpatient Hospital Stay (HOSPITAL_COMMUNITY)
Admission: EM | Admit: 2023-01-17 | Discharge: 2023-01-19 | DRG: 641 | Disposition: A | Discharge status: Home / Self Care | Payer: Medicare HMO | Attending: Family Medicine | Admitting: Family Medicine

## 2023-01-17 ENCOUNTER — Encounter (HOSPITAL_COMMUNITY): Payer: Self-pay

## 2023-01-17 ENCOUNTER — Ambulatory Visit (HOSPITAL_COMMUNITY)
Admission: RE | Admit: 2023-01-17 | Discharge: 2023-01-17 | Disposition: A | Discharge status: Home / Self Care | Payer: Medicare HMO | Source: Ambulatory Visit | Attending: Family Medicine | Admitting: Family Medicine

## 2023-01-17 DIAGNOSIS — Z66 Do not resuscitate: Secondary | ICD-10-CM | POA: Diagnosis not present

## 2023-01-17 DIAGNOSIS — W19XXXA Unspecified fall, initial encounter: Secondary | ICD-10-CM | POA: Diagnosis not present

## 2023-01-17 DIAGNOSIS — A046 Enteritis due to Yersinia enterocolitica: Secondary | ICD-10-CM | POA: Diagnosis not present

## 2023-01-17 DIAGNOSIS — M25521 Pain in right elbow: Secondary | ICD-10-CM

## 2023-01-17 DIAGNOSIS — Z8249 Family history of ischemic heart disease and other diseases of the circulatory system: Secondary | ICD-10-CM

## 2023-01-17 DIAGNOSIS — Z90710 Acquired absence of both cervix and uterus: Secondary | ICD-10-CM | POA: Diagnosis not present

## 2023-01-17 DIAGNOSIS — Z7982 Long term (current) use of aspirin: Secondary | ICD-10-CM | POA: Diagnosis not present

## 2023-01-17 DIAGNOSIS — R5383 Other fatigue: Secondary | ICD-10-CM | POA: Diagnosis present

## 2023-01-17 DIAGNOSIS — Z886 Allergy status to analgesic agent status: Secondary | ICD-10-CM

## 2023-01-17 DIAGNOSIS — M069 Rheumatoid arthritis, unspecified: Secondary | ICD-10-CM | POA: Diagnosis present

## 2023-01-17 DIAGNOSIS — M1A9XX1 Chronic gout, unspecified, with tophus (tophi): Secondary | ICD-10-CM | POA: Diagnosis present

## 2023-01-17 DIAGNOSIS — Z91040 Latex allergy status: Secondary | ICD-10-CM

## 2023-01-17 DIAGNOSIS — I10 Essential (primary) hypertension: Secondary | ICD-10-CM | POA: Insufficient documentation

## 2023-01-17 DIAGNOSIS — F17210 Nicotine dependence, cigarettes, uncomplicated: Secondary | ICD-10-CM | POA: Diagnosis present

## 2023-01-17 DIAGNOSIS — Z96641 Presence of right artificial hip joint: Secondary | ICD-10-CM | POA: Diagnosis present

## 2023-01-17 DIAGNOSIS — Z96652 Presence of left artificial knee joint: Secondary | ICD-10-CM | POA: Diagnosis present

## 2023-01-17 DIAGNOSIS — Z809 Family history of malignant neoplasm, unspecified: Secondary | ICD-10-CM | POA: Diagnosis not present

## 2023-01-17 DIAGNOSIS — D709 Neutropenia, unspecified: Secondary | ICD-10-CM | POA: Diagnosis present

## 2023-01-17 DIAGNOSIS — M1711 Unilateral primary osteoarthritis, right knee: Secondary | ICD-10-CM | POA: Diagnosis present

## 2023-01-17 DIAGNOSIS — B001 Herpesviral vesicular dermatitis: Secondary | ICD-10-CM | POA: Diagnosis present

## 2023-01-17 DIAGNOSIS — Z9081 Acquired absence of spleen: Secondary | ICD-10-CM

## 2023-01-17 DIAGNOSIS — M19021 Primary osteoarthritis, right elbow: Secondary | ICD-10-CM | POA: Diagnosis not present

## 2023-01-17 DIAGNOSIS — K219 Gastro-esophageal reflux disease without esophagitis: Secondary | ICD-10-CM | POA: Diagnosis present

## 2023-01-17 DIAGNOSIS — C9590 Leukemia, unspecified not having achieved remission: Secondary | ICD-10-CM | POA: Diagnosis present

## 2023-01-17 DIAGNOSIS — L03113 Cellulitis of right upper limb: Secondary | ICD-10-CM | POA: Diagnosis not present

## 2023-01-17 DIAGNOSIS — F419 Anxiety disorder, unspecified: Secondary | ICD-10-CM | POA: Diagnosis present

## 2023-01-17 DIAGNOSIS — Z79899 Other long term (current) drug therapy: Secondary | ICD-10-CM

## 2023-01-17 DIAGNOSIS — E559 Vitamin D deficiency, unspecified: Secondary | ICD-10-CM | POA: Diagnosis present

## 2023-01-17 DIAGNOSIS — I4891 Unspecified atrial fibrillation: Secondary | ICD-10-CM | POA: Diagnosis not present

## 2023-01-17 DIAGNOSIS — I480 Paroxysmal atrial fibrillation: Secondary | ICD-10-CM | POA: Diagnosis not present

## 2023-01-17 DIAGNOSIS — Z833 Family history of diabetes mellitus: Secondary | ICD-10-CM

## 2023-01-17 DIAGNOSIS — Z720 Tobacco use: Secondary | ICD-10-CM | POA: Diagnosis present

## 2023-01-17 DIAGNOSIS — Z888 Allergy status to other drugs, medicaments and biological substances status: Secondary | ICD-10-CM

## 2023-01-17 DIAGNOSIS — J449 Chronic obstructive pulmonary disease, unspecified: Secondary | ICD-10-CM | POA: Diagnosis not present

## 2023-01-17 DIAGNOSIS — R918 Other nonspecific abnormal finding of lung field: Secondary | ICD-10-CM | POA: Diagnosis not present

## 2023-01-17 DIAGNOSIS — Z882 Allergy status to sulfonamides status: Secondary | ICD-10-CM

## 2023-01-17 DIAGNOSIS — R509 Fever, unspecified: Secondary | ICD-10-CM

## 2023-01-17 DIAGNOSIS — Y92009 Unspecified place in unspecified non-institutional (private) residence as the place of occurrence of the external cause: Secondary | ICD-10-CM | POA: Diagnosis not present

## 2023-01-17 DIAGNOSIS — E871 Hypo-osmolality and hyponatremia: Secondary | ICD-10-CM | POA: Diagnosis not present

## 2023-01-17 DIAGNOSIS — R079 Chest pain, unspecified: Secondary | ICD-10-CM | POA: Diagnosis not present

## 2023-01-17 DIAGNOSIS — E785 Hyperlipidemia, unspecified: Secondary | ICD-10-CM | POA: Diagnosis present

## 2023-01-17 DIAGNOSIS — Z823 Family history of stroke: Secondary | ICD-10-CM

## 2023-01-17 LAB — CBC WITH DIFFERENTIAL/PLATELET
Abs Immature Granulocytes: 0 10*3/uL (ref 0.00–0.07)
Basophils Absolute: 0 10*3/uL (ref 0.0–0.1)
Basophils Relative: 0 %
Eosinophils Absolute: 0 10*3/uL (ref 0.0–0.5)
Eosinophils Relative: 0 %
HCT: 33.2 % — ABNORMAL LOW (ref 36.0–46.0)
Hemoglobin: 11.6 g/dL — ABNORMAL LOW (ref 12.0–15.0)
Lymphocytes Relative: 85 %
Lymphs Abs: 3.2 10*3/uL (ref 0.7–4.0)
MCH: 33.5 pg (ref 26.0–34.0)
MCHC: 34.9 g/dL (ref 30.0–36.0)
MCV: 96 fL (ref 80.0–100.0)
Monocytes Absolute: 0.2 10*3/uL (ref 0.1–1.0)
Monocytes Relative: 5 %
Neutro Abs: 0.4 10*3/uL — CL (ref 1.7–7.7)
Neutrophils Relative %: 10 %
Platelets: 291 10*3/uL (ref 150–400)
RBC: 3.46 MIL/uL — ABNORMAL LOW (ref 3.87–5.11)
RDW: 15.3 % (ref 11.5–15.5)
WBC: 3.8 10*3/uL — ABNORMAL LOW (ref 4.0–10.5)
nRBC: 0 % (ref 0.0–0.2)

## 2023-01-17 LAB — COMPREHENSIVE METABOLIC PANEL
ALT: 23 U/L (ref 0–44)
AST: 25 U/L (ref 15–41)
Albumin: 2.8 g/dL — ABNORMAL LOW (ref 3.5–5.0)
Alkaline Phosphatase: 63 U/L (ref 38–126)
Anion gap: 7 (ref 5–15)
BUN: 20 mg/dL (ref 8–23)
CO2: 21 mmol/L — ABNORMAL LOW (ref 22–32)
Calcium: 8.4 mg/dL — ABNORMAL LOW (ref 8.9–10.3)
Chloride: 90 mmol/L — ABNORMAL LOW (ref 98–111)
Creatinine, Ser: 0.73 mg/dL (ref 0.44–1.00)
GFR, Estimated: >60 mL/min (ref >60)
Glucose, Bld: 109 mg/dL — ABNORMAL HIGH (ref 70–99)
Potassium: 3.9 mmol/L (ref 3.5–5.1)
Sodium: 118 mmol/L — CL (ref 135–145)
Total Bilirubin: 0.6 mg/dL (ref <1.2)
Total Protein: 7.2 g/dL (ref 6.5–8.1)

## 2023-01-17 LAB — PROCALCITONIN: Procalcitonin: 0.18 ng/mL

## 2023-01-17 MED ORDER — SODIUM CHLORIDE 0.9 % IV BOLUS
500.0000 mL | Freq: Once | INTRAVENOUS | Status: AC
  Administered 2023-01-17: 500 mL via INTRAVENOUS

## 2023-01-17 MED ORDER — NICOTINE 21 MG/24HR TD PT24
21.0000 mg | MEDICATED_PATCH | Freq: Every day | TRANSDERMAL | Status: DC
  Administered 2023-01-17 – 2023-01-19 (×3): 21 mg via TRANSDERMAL
  Filled 2023-01-17 (×3): qty 1

## 2023-01-17 NOTE — ED Triage Notes (Signed)
Pt to er, pt states that she is here because he doctor called her to tell her sodium was 117.

## 2023-01-17 NOTE — ED Provider Notes (Signed)
[place] [facility]MENT AT [facility] Provider Note   CSN: [id number] Arrival date & time: 01/17/23  1802     History  Chief Complaint  Patient presents with   Fatigue    Sheri Pope is a 82 y.o. female.  Patient has a history lymphocytic leukemia.  She presents with weakness.  The history is provided by the patient and medical records. No language interpreter was used.  Weakness Severity:  Moderate Onset quality:  Sudden Timing:  Constant Progression:  Worsening Chronicity:  New Context: not alcohol use   Relieved by:  Nothing Worsened by:  Nothing Associated symptoms: no abdominal pain, no chest pain, no cough, no diarrhea, no frequency, no headaches and no seizures        Home Medications Prior to Admission medications   Medication Sig Start Date End Date Taking? Authorizing Provider  acetaminophen (TYLENOL) 325 MG tablet Take 2 tablets (650 mg total) by mouth every 6 (six) hours as needed for mild pain (or Fever >/= 101). 03/28/22  Yes Angiulli, Mcarthur Rossetti, PA-C  ALPRAZolam Prudy Feeler) 0.25 MG tablet Take 1 tablet (0.25 mg total) by mouth at bedtime. 03/28/22  Yes Angiulli, Mcarthur Rossetti, PA-C  aluminum-magnesium hydroxide 200-200 MG/5ML suspension Take 10 mLs by mouth every 6 (six) hours as needed for indigestion.   Yes [provider]  amoxicillin-clavulanate (AUGMENTIN) 875-125 MG tablet Take 1 tablet by mouth 2 (two) times daily. 01/17/23  Yes [provider]  aspirin 81 MG chewable tablet Chew 1 tablet (81 mg total) by mouth daily. 03/21/22  Yes Johnson, Clanford L, MD  HYDROcodone-acetaminophen (NORCO/VICODIN) 5-325 MG tablet Take 0.5 tablets by mouth 2 (two) times daily as needed for moderate pain. 03/28/22  Yes Angiulli, Mcarthur Rossetti, PA-C  losartan (COZAAR) 25 MG tablet Take 25 mg by mouth daily. 04/04/22  Yes [provider]  methocarbamol (ROBAXIN) 500 MG tablet Take 1 tablet (500 mg total) by mouth 2 (two) times daily. 03/28/22   Yes Angiulli, Mcarthur Rossetti, PA-C  ondansetron (ZOFRAN-ODT) 4 MG disintegrating tablet Take 1 tablet (4 mg total) by mouth every 8 (eight) hours as needed for nausea or vomiting. Patient not taking: Reported on 01/17/2023 01/05/23   Mauro Kaufmann, NP      Allergies    Codeine, Prednisone, Sulfa antibiotics, Aleve [naproxen], and Latex    Review of Systems   Review of Systems  Constitutional:  Negative for appetite change and fatigue.  HENT:  Negative for congestion, ear discharge and sinus pressure.   Eyes:  Negative for discharge.  Respiratory:  Negative for cough.   Cardiovascular:  Negative for chest pain.  Gastrointestinal:  Negative for abdominal pain and diarrhea.  Genitourinary:  Negative for frequency and hematuria.  Musculoskeletal:  Negative for back pain.  Skin:  Negative for rash.  Neurological:  Positive for weakness. Negative for seizures and headaches.  Psychiatric/Behavioral:  Negative for hallucinations.     Physical Exam Updated Vital Signs BP 134/67   Pulse 88   Temp 98.9 F (37.2 C) (Oral)   Resp 20   Ht 5\' 1"  (1.549 m)   Wt 43.5 kg   SpO2 92%   BMI 18.14 kg/m  Physical Exam Vitals and nursing note reviewed.  Constitutional:      Appearance: She is well-developed.  HENT:     Head: Normocephalic.     Nose: Nose normal.  Eyes:     General: No scleral icterus.    Conjunctiva/sclera: Conjunctivae normal.  Neck:  Thyroid: No thyromegaly.  Cardiovascular:     Rate and Rhythm: Normal rate and regular rhythm.     Heart sounds: No murmur heard.    No friction rub. No gallop.  Pulmonary:     Breath sounds: No stridor. No wheezing or rales.  Chest:     Chest wall: No tenderness.  Abdominal:     General: There is no distension.     Tenderness: There is no abdominal tenderness. There is no rebound.  Musculoskeletal:        General: Normal range of motion.     Cervical back: Neck supple.  Lymphadenopathy:     Cervical: No cervical adenopathy.   Skin:    Findings: No erythema or rash.  Neurological:     Mental Status: She is alert and oriented to person, place, and time.     Motor: No abnormal muscle tone.     Coordination: Coordination normal.  Psychiatric:        Behavior: Behavior normal.     ED Results / Procedures / Treatments   Labs (all labs ordered are listed, but only abnormal results are displayed) Labs Reviewed  CBC WITH DIFFERENTIAL/PLATELET - Abnormal; Notable for the following components:      Result Value   WBC 3.8 (*)    RBC 3.46 (*)    Hemoglobin 11.6 (*)    HCT 33.2 (*)    Neutro Abs 0.4 (*)    All other components within normal limits  COMPREHENSIVE METABOLIC PANEL - Abnormal; Notable for the following components:   Sodium 118 (*)    Chloride 90 (*)    CO2 21 (*)    Glucose, Bld 109 (*)    Calcium 8.4 (*)    Albumin 2.8 (*)    All other components within normal limits    EKG None  Radiology DG Chest 2 View  Result Date: 01/17/2023 CLINICAL DATA:  Fever.  Pain.  Fall.  History of COPD. EXAM: CHEST - 2 VIEW COMPARISON:  Chest radiographs 01/06/2023 in 10/21/2018 FINDINGS: Cardiac silhouette and mediastinal contours are within normal limits. Mild-to-moderate atherosclerotic calcification within the aortic arch. Flattening of the diaphragms and moderate hyperinflation, unchanged. Mild bilateral lower lung interstitial thickening is similar to prior. Increased lucencies within the bilateral upper lungs again compatible with chronic emphysematous change. No pleural effusion or thorax. Mild anterior height loss of a mid thoracic vertebral bodies unchanged from 10/21/2018 and chronic. Moderate multilevel anterior disc space narrowing greatest within the midthoracic spine. IMPRESSION: 1. No acute cardiopulmonary process. 2. Chronic emphysematous change. Electronically Signed   By: Neita Garnet M.D.   On: 01/17/2023 16:41   DG ELBOW COMPLETE RIGHT (3+VIEW)  Result Date: 01/17/2023 CLINICAL DATA:  Fall  on 182024. Right elbow pain. Hand and right elbow from fall. EXAM: RIGHT ELBOW - COMPLETE 3+ VIEW COMPARISON:  None Available. FINDINGS: There is diffuse decreased bone mineralization. No definitive elbow joint effusion is seen. Moderate radiocapitellar joint space narrowing. Mild trochlea-olecranon joint space narrowing and peripheral medial spurring. Minimal chronic enthesopathic change at the triceps insertion on the olecranon. No definite acute fracture is seen. No dislocation. IMPRESSION: 1. No definite acute fracture is seen. 2. 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 Electronically Signed   By: Neita Garnet M.D.   On: 01/17/2023 16:39    Procedures Procedures    Medications Ordered in ED Medications  nicotine (NICODERM CQ - dosed in mg/24 hours) patch 21 mg (has no administration in time range)  sodium  chloride 0.9 % bolus 500 mL (0 mLs Intravenous Stopped 01/17/23 2239)    ED Course/ Medical Decision Making/ A&P   CRITICAL CARE Performed by: Bethann Berkshire Total critical care time: 44 minutes Critical care time was exclusive of separately billable procedures and treating other patients. Critical care was necessary to treat or prevent imminent or life-threatening deterioration. Critical care was time spent personally by me on the following activities: development of treatment plan with patient and/or surrogate as well as nursing, discussions with consultants, evaluation of patient's response to treatment, examination of patient, obtaining history from patient or surrogate, ordering and performing treatments and interventions, ordering and review of laboratory studies, ordering and review of radiographic studies, pulse oximetry and re-evaluation of patient's condition.  Click here for ABCD2, HEART and other calculatorsREFRESH Note before signing :1}                              Medical Decision Making Amount and/or Complexity of Data Reviewed Labs:  ordered. ECG/medicine tests: ordered.  Risk Decision regarding hospitalization.  This patient presents to the ED for concern of weakness and hyponatremia, this involves an extensive number of treatment options, and is a complaint that carries with it a high risk of complications and morbidity.  The differential diagnosis includes hyponatremia sepsis   Co morbidities that complicate the patient evaluation  Leukemia   Additional history obtained:  Additional history obtained from patient External records from outside source obtained and reviewed including all records   Lab Tests:  I Ordered, and personally interpreted labs.  The pertinent results include: Absolute neutrophil count 0.4, sodium 118   Imaging Studies ordered:  No imaging  Cardiac Monitoring: / EKG:  The patient was maintained on a cardiac monitor.  I personally viewed and interpreted the cardiac monitored which showed an underlying rhythm of: Normal sinus rhythm   Consultations Obtained:  I requested consultation with the hospitalist,  and discussed lab and imaging findings as well as pertinent plan - they recommend: Admit   Problem List / ED Course / Critical interventions / Medication management  Leukopenia and hyponatremia I ordered medication including normal saline for the hyponatremia Reevaluation of the patient after these medicines showed that the patient stayed the same I have reviewed the patients home medicines and have made adjustments as needed   Social Determinants of Health:  None   Test / Admission - Considered:  None  Patient has hyponatremia and leukopenia.  She is admitted to medicine        Final Clinical Impression(s) / ED Diagnoses Final diagnoses:  None    Rx / DC Orders ED Discharge Orders     None         Bethann Berkshire, MD 01/19/23 1218

## 2023-01-17 NOTE — ED Notes (Signed)
ED TO INPATIENT HANDOFF REPORT  ED Nurse Name and Phone #:  Leanord Hawking, RN   S Name/Age/Gender Sheri Pope 82 y.o. female Room/Bed: APA18/APA18  Code Status   Code Status: Limited: Do not attempt resuscitation (DNR) -DNR-LIMITED -Do Not Intubate/DNI   Home/SNF/Other Home Patient oriented to: self, place, time, and situation Is this baseline? Yes   Triage Complete: Triage complete  Chief Complaint Acute hyponatremia [E87.1]  Triage Note Pt to er, pt states that she is here because he doctor called her to tell her sodium was 117.     Allergies Allergies  Allergen Reactions   Codeine Nausea And Vomiting   Prednisone Other (See Comments)    Thought she was having a heart attack   Sulfa Antibiotics Other (See Comments)    unknown   Aleve [Naproxen] Anxiety    Jittery and hot flashes   Latex Rash    Level of Care/Admitting Diagnosis ED Disposition     ED Disposition  Admit   Condition  --   Comment  Hospital Area: [facility] [[id number]]  Level of Care: Stepdown [14]  Covid Evaluation: Asymptomatic - no recent exposure (last 10 days) testing not required  Diagnosis: Acute hyponatremia [564332]  Admitting Physician: Lilyan Gilford [[id number]]  Attending Physician: Lilyan Gilford [[id number]]  Certification:: I certify this patient will need inpatient services for at least 2 midnights          B Medical/Surgery History Past Medical History:  Diagnosis Date   Arthritis    RA AND OA --PAIN AND SWELLIN IN LEFT KNEE   Blood in urine    NEGATIVE UROLOGY WORK UP --BUT ALWAYS HAS BLOOD IN URINE   GERD (gastroesophageal reflux disease)    H/O hiatal hernia    Hyperlipidemia    Leukemia (HCC)    PONV (postoperative nausea and vomiting)    ALWAYS SICK AFTER SURGERIES EXCEPT AFTER HIP REPLACMENT 2007   Vitamin D deficiency    Past Surgical History:  Procedure Laterality Date   ABDOMINAL HYSTERECTOMY  1870'S   BREAST SURGERY     BREAST BIOSPIES    CATARACT EXTRACTION W/PHACO Left 05/30/2012   Procedure: CATARACT EXTRACTION PHACO AND INTRAOCULAR LENS PLACEMENT (IOC);  Surgeon: Gemma Payor, MD;  Location: [facility];  Service: Ophthalmology;  Laterality: Left;  CDE: 16.99   CATARACT EXTRACTION W/PHACO Right 06/24/2012   Procedure: CATARACT EXTRACTION PHACO AND INTRAOCULAR LENS PLACEMENT (IOC);  Surgeon: Gemma Payor, MD;  Location: [facility];  Service: Ophthalmology;  Laterality: Right;  CDE:17.92   COLONOSCOPY WITH PROPOFOL N/A 10/14/2020   Procedure: COLONOSCOPY WITH PROPOFOL;  Surgeon: Corbin Ade, MD;  Location: [facility];  Service: Endoscopy;  Laterality: N/A;  ASA III / 12:30   ESOPHAGOGASTRODUODENOSCOPY (EGD) WITH PROPOFOL N/A 10/14/2020   Procedure: ESOPHAGOGASTRODUODENOSCOPY (EGD) WITH PROPOFOL;  Surgeon: Corbin Ade, MD;  Location: [facility];  Service: Endoscopy;  Laterality: N/A;   HIP SURGERY     JOINT REPLACEMENT  2007   RIGHT HIP REPLACEMENT   LEFT KNEE ARTHROSCOPY  AUG 2012   MALONEY DILATION  10/14/2020   Procedure: MALONEY DILATION;  Surgeon: Corbin Ade, MD;  Location: [facility];  Service: Endoscopy;;   MULTIPLE ORTHOPEDIC SURGERIES     ON BOTH HANDS AND BOTH FEET, RIGHT ELBOW   SPLEENECTOMY  IN THE 70'S   FOR LARGE CYST   TOTAL KNEE ARTHROPLASTY  11/10/2011   Procedure: TOTAL KNEE ARTHROPLASTY;  Surgeon: Kathryne Hitch, MD;  Location: [facility];  Service: Orthopedics;  Laterality: Left;  Left Total Knee Arthroplasty   YAG LASER APPLICATION Bilateral    Dr. Celine Ahr IV Location/Drains/Wounds Patient Lines/Drains/Airways Status     Active Line/Drains/Airways     Name Placement date Placement time Site Days   Peripheral IV 01/17/23 22 G Anterior;Left;Proximal Forearm 01/17/23  2119  Forearm  less than 1            Intake/Output Last 24 hours  Intake/Output Summary (Last 24 hours) at 01/17/2023 2316 Last data filed at 01/17/2023 2239 Gross per 24 hour  Intake 500 ml  Output --  Net  500 ml    Labs/Imaging Results for orders placed or performed during the hospital encounter of 01/17/23 (from the past 48 hour(s))  CBC with Differential     Status: Abnormal   Collection Time: 01/17/23  8:40 PM  Result Value Ref Range   WBC 3.8 (L) 4.0 - 10.5 K/uL   RBC 3.46 (L) 3.87 - 5.11 MIL/uL   Hemoglobin 11.6 (L) 12.0 - 15.0 g/dL   HCT 13.0 (L) 86.5 - 78.4 %   MCV 96.0 80.0 - 100.0 fL   MCH 33.5 26.0 - 34.0 pg   MCHC 34.9 30.0 - 36.0 g/dL   RDW 69.6 29.5 - 28.4 %   Platelets 291 150 - 400 K/uL   nRBC 0.0 0.0 - 0.2 %   Neutrophils Relative % 10 %   Neutro Abs 0.4 (LL) 1.7 - 7.7 K/uL    Comment: This critical result has verified and been called to K. BELTON by Donetta Potts on 11 20 2024 at 2142, and has been read back.    Lymphocytes Relative 85 %   Lymphs Abs 3.2 0.7 - 4.0 K/uL   Monocytes Relative 5 %   Monocytes Absolute 0.2 0.1 - 1.0 K/uL   Eosinophils Relative 0 %   Eosinophils Absolute 0.0 0.0 - 0.5 K/uL   Basophils Relative 0 %   Basophils Absolute 0.0 0.0 - 0.1 K/uL   WBC Morphology TOXIC GRANULATION    RBC Morphology See Note     Comment: POLYCHROMATOPHILIC RED CELLS PRESENT   Smear Review MORPHOLOGY UNREMARKABLE    Abs Immature Granulocytes 0.00 0.00 - 0.07 K/uL   Dohle Bodies PRESENT    Polychromasia PRESENT     Comment: Performed at [facility], [address], [place], [place] [address]  Comprehensive metabolic panel     Status: Abnormal   Collection Time: 01/17/23  8:40 PM  Result Value Ref Range   Sodium 118 (LL) 135 - 145 mmol/L    Comment: CRITICAL RESULT CALLED TO, READ BACK BY AND VERIFIED WITH BRADSHAW,A ON 01/17/23 AT 2120 BY LOY,C   Potassium 3.9 3.5 - 5.1 mmol/L   Chloride 90 (L) 98 - 111 mmol/L   CO2 21 (L) 22 - 32 mmol/L   Glucose, Bld 109 (H) 70 - 99 mg/dL    Comment: Glucose reference range applies only to samples taken after fasting for at least 8 hours.   BUN 20 8 - 23 mg/dL   Creatinine, Ser 0.10 0.44 - 1.00 mg/dL   Calcium 8.4 (L)  8.9 - 10.3 mg/dL   Total Protein 7.2 6.5 - 8.1 g/dL   Albumin 2.8 (L) 3.5 - 5.0 g/dL   AST 25 15 - 41 U/L   ALT 23 0 - 44 U/L   Alkaline Phosphatase 63 38 - 126 U/L   Total Bilirubin 0.6 <1.2  mg/dL   GFR, Estimated >91 >47 mL/min    Comment: (NOTE) Calculated using the CKD-EPI Creatinine Equation (2021)    Anion gap 7 5 - 15    Comment: Performed at [facility], [address], [place], [place] [address]   DG Chest 2 View  Result Date: 01/17/2023 CLINICAL DATA:  Fever.  Pain.  Fall.  History of COPD. EXAM: CHEST - 2 VIEW COMPARISON:  Chest radiographs 01/06/2023 in 10/21/2018 FINDINGS: Cardiac silhouette and mediastinal contours are within normal limits. Mild-to-moderate atherosclerotic calcification within the aortic arch. Flattening of the diaphragms and moderate hyperinflation, unchanged. Mild bilateral lower lung interstitial thickening is similar to prior. Increased lucencies within the bilateral upper lungs again compatible with chronic emphysematous change. No pleural effusion or thorax. Mild anterior height loss of a mid thoracic vertebral bodies unchanged from 10/21/2018 and chronic. Moderate multilevel anterior disc space narrowing greatest within the midthoracic spine. IMPRESSION: 1. No acute cardiopulmonary process. 2. Chronic emphysematous change. Electronically Signed   By: Neita Garnet M.D.   On: 01/17/2023 16:41   DG ELBOW COMPLETE RIGHT (3+VIEW)  Result Date: 01/17/2023 CLINICAL DATA:  Fall on 182024. Right elbow pain. Hand and right elbow from fall. EXAM: RIGHT ELBOW - COMPLETE 3+ VIEW COMPARISON:  None Available. FINDINGS: There is diffuse decreased bone mineralization. No definitive elbow joint effusion is seen. Moderate radiocapitellar joint space narrowing. Mild trochlea-olecranon joint space narrowing and peripheral medial spurring. Minimal chronic enthesopathic change at the triceps insertion on the olecranon. No definite acute fracture is seen. No dislocation.  IMPRESSION: 1. No definite acute fracture is seen. 2. 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 Electronically Signed   By: Neita Garnet M.D.   On: 01/17/2023 16:39    Pending Labs Unresulted Labs (From admission, onward)     Start     Ordered   01/17/23 2316  Procalcitonin  Add-on,   AD       References:    Procalcitonin Lower Respiratory Tract Infection AND Sepsis Procalcitonin Algorithm   01/17/23 2315   Signed and Held  Magnesium  Tomorrow morning,   R        Signed and Held   Signed and Held  CBC with Differential/Platelet  Tomorrow morning,   R        Signed and Held   Signed and Held  TSH  Tomorrow morning,   R        Signed and Held   Signed and Held  Urinalysis, Routine w reflex microscopic -Urine, Clean Catch  Once,   R       Question:  Specimen Source  Answer:  Urine, Clean Catch   Signed and Held   Signed and Held  Basic metabolic panel  Now then every 6 hours,   R      Signed and Held   Signed and Held  Osmolality, urine  Once,   R        Signed and Held   Signed and Held  Osmolality  Add-on,   R        Signed and Held            Vitals/Pain Today's Vitals   01/17/23 2215 01/17/23 2230 01/17/23 2245 01/17/23 2308  BP: (!) 132/59 (!) 131/58 134/67 129/67  Pulse: 86 82 88 88  Resp: 17 20 20    Temp:      TempSrc:      SpO2: 93% 91% 92% 93%  Weight:  Height:      PainSc:    0-No pain    Isolation Precautions No active isolations  Medications Medications  nicotine (NICODERM CQ - dosed in mg/24 hours) patch 21 mg (21 mg Transdermal Patch Applied 01/17/23 2308)  sodium chloride 0.9 % bolus 500 mL (0 mLs Intravenous Stopped 01/17/23 2239)    Mobility walks     Focused Assessments Neuro Assessment Handoff:  Swallow screen pass?  N/A Cardiac Rhythm: Normal sinus rhythm       Neuro Assessment:   Neuro Checks:      Has TPA been given? No If patient is a Neuro Trauma and patient is going to OR before floor call  report to 4N Charge nurse: [phone] or [phone]   R Recommendations: See Admitting Provider Note  Report given to:   Additional Notes: N/A

## 2023-01-17 NOTE — ED Notes (Signed)
Patient able to stand and take a few steps to get into bed  Warm blanket given.

## 2023-01-18 DIAGNOSIS — M069 Rheumatoid arthritis, unspecified: Secondary | ICD-10-CM

## 2023-01-18 DIAGNOSIS — D709 Neutropenia, unspecified: Secondary | ICD-10-CM

## 2023-01-18 DIAGNOSIS — Y92009 Unspecified place in unspecified non-institutional (private) residence as the place of occurrence of the external cause: Secondary | ICD-10-CM

## 2023-01-18 DIAGNOSIS — Z720 Tobacco use: Secondary | ICD-10-CM

## 2023-01-18 DIAGNOSIS — F419 Anxiety disorder, unspecified: Secondary | ICD-10-CM

## 2023-01-18 DIAGNOSIS — I10 Essential (primary) hypertension: Secondary | ICD-10-CM

## 2023-01-18 DIAGNOSIS — E871 Hypo-osmolality and hyponatremia: Secondary | ICD-10-CM | POA: Diagnosis not present

## 2023-01-18 DIAGNOSIS — W19XXXA Unspecified fall, initial encounter: Secondary | ICD-10-CM

## 2023-01-18 LAB — URINALYSIS, ROUTINE W REFLEX MICROSCOPIC
Bilirubin Urine: NEGATIVE
Glucose, UA: NEGATIVE mg/dL
Hgb urine dipstick: NEGATIVE
Ketones, ur: NEGATIVE mg/dL
Leukocytes,Ua: NEGATIVE
Nitrite: NEGATIVE
Protein, ur: NEGATIVE mg/dL
Specific Gravity, Urine: 1.005 (ref 1.005–1.030)
pH: 5 (ref 5.0–8.0)

## 2023-01-18 LAB — CBC WITH DIFFERENTIAL/PLATELET
Abs Immature Granulocytes: 0 10*3/uL (ref 0.00–0.07)
Basophils Absolute: 0 10*3/uL (ref 0.0–0.1)
Basophils Relative: 0 %
Eosinophils Absolute: 0 10*3/uL (ref 0.0–0.5)
Eosinophils Relative: 0 %
HCT: 31.7 % — ABNORMAL LOW (ref 36.0–46.0)
Hemoglobin: 10.8 g/dL — ABNORMAL LOW (ref 12.0–15.0)
Immature Granulocytes: 0 %
Lymphocytes Relative: 90 %
Lymphs Abs: 3 10*3/uL (ref 0.7–4.0)
MCH: 32.6 pg (ref 26.0–34.0)
MCHC: 34.1 g/dL (ref 30.0–36.0)
MCV: 95.8 fL (ref 80.0–100.0)
Monocytes Absolute: 0.1 10*3/uL (ref 0.1–1.0)
Monocytes Relative: 4 %
Neutro Abs: 0.2 10*3/uL — CL (ref 1.7–7.7)
Neutrophils Relative %: 6 %
Platelets: 299 10*3/uL (ref 150–400)
RBC: 3.31 MIL/uL — ABNORMAL LOW (ref 3.87–5.11)
RDW: 15.1 % (ref 11.5–15.5)
WBC: 3.4 10*3/uL — ABNORMAL LOW (ref 4.0–10.5)
nRBC: 0.6 % — ABNORMAL HIGH (ref 0.0–0.2)

## 2023-01-18 LAB — BASIC METABOLIC PANEL
Anion gap: 8 (ref 5–15)
Anion gap: 9 (ref 5–15)
Anion gap: 13 (ref 5–15)
BUN: 15 mg/dL (ref 8–23)
BUN: 13 mg/dL (ref 8–23)
BUN: 14 mg/dL (ref 8–23)
CO2: 20 mmol/L — ABNORMAL LOW (ref 22–32)
CO2: 19 mmol/L — ABNORMAL LOW (ref 22–32)
CO2: 18 mmol/L — ABNORMAL LOW (ref 22–32)
Calcium: 8.2 mg/dL — ABNORMAL LOW (ref 8.9–10.3)
Calcium: 8.4 mg/dL — ABNORMAL LOW (ref 8.9–10.3)
Calcium: 8.5 mg/dL — ABNORMAL LOW (ref 8.9–10.3)
Chloride: 95 mmol/L — ABNORMAL LOW (ref 98–111)
Chloride: 95 mmol/L — ABNORMAL LOW (ref 98–111)
Chloride: 96 mmol/L — ABNORMAL LOW (ref 98–111)
Creatinine, Ser: 0.61 mg/dL (ref 0.44–1.00)
Creatinine, Ser: 0.56 mg/dL (ref 0.44–1.00)
Creatinine, Ser: 0.5 mg/dL (ref 0.44–1.00)
GFR, Estimated: >60 mL/min (ref >60)
GFR, Estimated: >60 mL/min (ref >60)
GFR, Estimated: >60 mL/min (ref >60)
Glucose, Bld: 103 mg/dL — ABNORMAL HIGH (ref 70–99)
Glucose, Bld: 102 mg/dL — ABNORMAL HIGH (ref 70–99)
Glucose, Bld: 102 mg/dL — ABNORMAL HIGH (ref 70–99)
Potassium: 3.8 mmol/L (ref 3.5–5.1)
Potassium: 4 mmol/L (ref 3.5–5.1)
Potassium: 3.8 mmol/L (ref 3.5–5.1)
Sodium: 123 mmol/L — ABNORMAL LOW (ref 135–145)
Sodium: 123 mmol/L — ABNORMAL LOW (ref 135–145)
Sodium: 127 mmol/L — ABNORMAL LOW (ref 135–145)

## 2023-01-18 LAB — OSMOLALITY, URINE: Osmolality, Ur: 187 mosm/kg — ABNORMAL LOW (ref 300–900)

## 2023-01-18 LAB — MAGNESIUM: Magnesium: 1.9 mg/dL (ref 1.7–2.4)

## 2023-01-18 LAB — OSMOLALITY: Osmolality: 264 mosm/kg — ABNORMAL LOW (ref 275–295)

## 2023-01-18 LAB — TSH: TSH: 6.059 u[IU]/mL — ABNORMAL HIGH (ref 0.350–4.500)

## 2023-01-18 LAB — MRSA NEXT GEN BY PCR, NASAL: MRSA by PCR Next Gen: NOT DETECTED

## 2023-01-18 MED ORDER — ACETAMINOPHEN 325 MG PO TABS
650.0000 mg | ORAL_TABLET | Freq: Four times a day (QID) | ORAL | Status: DC | PRN
  Administered 2023-01-18: 650 mg via ORAL
  Filled 2023-01-18 (×2): qty 2

## 2023-01-18 MED ORDER — ACETAMINOPHEN 650 MG RE SUPP: 650.0000 mg | Freq: Four times a day (QID) | RECTAL | Status: DC | PRN

## 2023-01-18 MED ORDER — HEPARIN SODIUM (PORCINE) 5000 UNIT/ML IJ SOLN
5000.0000 [IU] | Freq: Three times a day (TID) | INTRAMUSCULAR | Status: DC
Start: 2023-01-18 — End: 2023-01-19
  Administered 2023-01-18 – 2023-01-19 (×6): 5000 [IU] via SUBCUTANEOUS
  Filled 2023-01-18 (×6): qty 1

## 2023-01-18 MED ORDER — SODIUM CHLORIDE 0.9 % IV SOLN
INTRAVENOUS | Status: DC
Start: 2023-01-18 — End: 2023-01-18

## 2023-01-18 MED ORDER — ALPRAZOLAM 0.25 MG PO TABS
0.2500 mg | ORAL_TABLET | Freq: Every day | ORAL | Status: DC
  Administered 2023-01-18 (×2): 0.25 mg via ORAL
  Filled 2023-01-18 (×2): qty 1

## 2023-01-18 MED ORDER — SODIUM CHLORIDE 0.9 % IV SOLN
INTRAVENOUS | Status: DC
Start: 2023-01-18 — End: 2023-01-19

## 2023-01-18 MED ORDER — ONDANSETRON HCL 4 MG/2ML IJ SOLN: 4.0000 mg | Freq: Four times a day (QID) | INTRAMUSCULAR | Status: DC | PRN

## 2023-01-18 MED ORDER — HYDRALAZINE HCL 20 MG/ML IJ SOLN: 5.0000 mg | INTRAMUSCULAR | Status: DC | PRN

## 2023-01-18 MED ORDER — ONDANSETRON HCL 4 MG PO TABS: 4.0000 mg | ORAL_TABLET | Freq: Four times a day (QID) | ORAL | Status: DC | PRN

## 2023-01-18 MED ORDER — SODIUM CHLORIDE 1 G PO TABS
1.0000 g | ORAL_TABLET | Freq: Three times a day (TID) | ORAL | Status: DC
  Administered 2023-01-18 – 2023-01-19 (×5): 1 g via ORAL
  Filled 2023-01-18 (×5): qty 1

## 2023-01-18 MED ORDER — CHLORHEXIDINE GLUCONATE CLOTH 2 % EX PADS
6.0000 | MEDICATED_PAD | Freq: Every day | CUTANEOUS | Status: DC
  Administered 2023-01-18 – 2023-01-19 (×2): 6 via TOPICAL

## 2023-01-18 MED ORDER — AMOXICILLIN 250 MG PO CAPS
500.0000 mg | ORAL_CAPSULE | Freq: Three times a day (TID) | ORAL | Status: DC
  Administered 2023-01-18 – 2023-01-19 (×4): 500 mg via ORAL
  Filled 2023-01-18 (×4): qty 2

## 2023-01-18 MED ORDER — ORAL CARE MOUTH RINSE: 15.0000 mL | OROMUCOSAL | Status: DC | PRN

## 2023-01-18 MED ORDER — SODIUM CHLORIDE 0.9 % IV SOLN: INTRAVENOUS | Status: DC

## 2023-01-18 MED ORDER — OXYCODONE HCL 5 MG PO TABS
5.0000 mg | ORAL_TABLET | ORAL | Status: DC | PRN
  Filled 2023-01-18 (×2): qty 1

## 2023-01-18 MED ORDER — ASPIRIN 81 MG PO CHEW
81.0000 mg | CHEWABLE_TABLET | Freq: Every day | ORAL | Status: DC
  Administered 2023-01-18 – 2023-01-19 (×2): 81 mg via ORAL
  Filled 2023-01-18 (×2): qty 1

## 2023-01-18 MED ORDER — LOSARTAN POTASSIUM 25 MG PO TABS
25.0000 mg | ORAL_TABLET | Freq: Every day | ORAL | Status: DC
  Administered 2023-01-18: 25 mg via ORAL
  Filled 2023-01-18: qty 1

## 2023-01-18 NOTE — Plan of Care (Signed)
  Problem: Education: Goal: Knowledge of General Education information will improve Description: Including pain rating scale, medication(s)/side effects and non-pharmacologic comfort measures Outcome: Progressing   Problem: Health Behavior/Discharge Planning: Goal: Ability to manage health-related needs will improve Outcome: Progressing   Problem: Clinical Measurements: Goal: Ability to maintain clinical measurements within normal limits will improve Outcome: Progressing Goal: Respiratory complications will improve Outcome: Progressing Goal: Cardiovascular complication will be avoided Outcome: Progressing   Problem: Activity: Goal: Risk for activity intolerance will decrease Outcome: Progressing   Problem: Nutrition: Goal: Adequate nutrition will be maintained Outcome: Progressing   Problem: Elimination: Goal: Will not experience complications related to bowel motility Outcome: Progressing Goal: Will not experience complications related to urinary retention Outcome: Progressing   Problem: Pain Management: Goal: General experience of comfort will improve Outcome: Progressing

## 2023-01-18 NOTE — Assessment & Plan Note (Signed)
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

## 2023-01-18 NOTE — Assessment & Plan Note (Signed)
Continue losartan. 

## 2023-01-18 NOTE — Assessment & Plan Note (Signed)
-   Secondary to generalized weakness - Likely related to hyponatremia - PT eval and treat - See plan for hyponatremia

## 2023-01-18 NOTE — Progress Notes (Signed)
   01/18/23 1450  TOC Brief Assessment  Insurance and Status Reviewed  Patient has primary care physician Yes  Home environment has been reviewed from home  Prior level of function: independent  Prior/Current Home Services No current home services  Social Determinants of Health Reivew SDOH reviewed no interventions necessary  Readmission risk has been reviewed Yes  Transition of care needs transition of care needs identified, TOC will continue to follow    PT recommending HHPT at dc. Will follow up with pt when she is more stable to assist with dc planning.  Transition of Care Department [facility]) has reviewed patient and no TOC needs have been identified at this time. We will continue to monitor patient advancement through interdisciplinary progression rounds. If new patient transition needs arise, please place a TOC consult.

## 2023-01-18 NOTE — Progress Notes (Signed)
Date and time results received: 01/18/23 0457   Test: [facility] Critical Value: 0.2  Name of Provider Notified:  Sheliah Plane  Orders Received? Or Actions Taken?: MD aware. No further actions at this time.  [place] RN

## 2023-01-18 NOTE — Assessment & Plan Note (Signed)
-   Smokes a pack per day - Counseled on importance of cessation - Nicotine patch ordered

## 2023-01-18 NOTE — Plan of Care (Signed)
  Problem: Acute Rehab PT Goals(only PT should resolve) Goal: Pt Will Go Supine/Side To Sit Outcome: Progressing Flowsheets (Taken 01/18/2023 1344) Pt will go Supine/Side to Sit: with modified independence Goal: Patient Will Transfer Sit To/From Stand Outcome: Progressing Flowsheets (Taken 01/18/2023 1344) Patient will transfer sit to/from stand: with modified independence Goal: Pt Will Transfer Bed To Chair/Chair To Bed Outcome: Progressing Flowsheets (Taken 01/18/2023 1344) Pt will Transfer Bed to Chair/Chair to Bed: with modified independence Goal: Pt Will Ambulate Outcome: Progressing Flowsheets (Taken 01/18/2023 1344) Pt will Ambulate:  100 feet  with modified independence  with supervision  with rolling walker   2:03 PM, 01/18/23 Ocie Bob, MPT Physical Therapist with [facility] [phone] office [phone] mobile phone

## 2023-01-18 NOTE — Assessment & Plan Note (Signed)
-   Sodium 118 - This is down from 127 earlier this month - Continue gentle hydration - Continue serial BMP - Continue sodium tab with meals - Urine and serum osmolality pending - Monitor on telemetry

## 2023-01-18 NOTE — Evaluation (Signed)
Physical Therapy Evaluation Patient Details Name: Sheri Pope MRN: [id number] DOB: 07/28/1940 Today's Date: 01/18/2023  History of Present Illness  Sheri Pope is a 82 y.o. female with medical history significant of rheumatoid arthritis, GERD, hyperlipidemia, lymphocytic leukemia, tobacco use disorder, and more presents the ED with a chief complaint of generalized weakness and falls.  Patient reports she fell twice on Monday and fell once yesterday.  Each of these falls she did not even know it was coming, just next and she knew she was on the ground.  She reports she did not lose consciousness.  She did not hit her head.  She reports that she has had a fever at home of 102.6 at the highest.  She has not taken any extra Tylenol for that because she is already taking hydrocodone for her rheumatoid arthritis pain.  She reports prior to going to the PCP this a.m. her temp was 100.1.  At the PCP she did not have a fever.  Here in the ER she has not had a fever.  Patient reports she has had loose stools x 2.  No abdominal pain.  She has had nausea but no vomiting.  She is not taking anything for nausea because she reports that she would rather take less medication if possible.  She does have antiemetic prescription at home per her report.  Patient denies any chest pain, dizziness, dyspnea.  She reports that a couple of her falls happened while going to sit on the bedside commode.  This would indicate a proximal muscle weakness, but she also had a fall while she was holding onto the walker.  She has pain in her right elbow and left elbow.  She hit each of those when falling.  She reports the pain is mildly worse than normal.  She has had no cough, congestion.  No new rashes.  Patient has no other complaints at this time.   Clinical Impression  Patient demonstrates good return for transfers and getting into bed, had mild difficulty sitting up at bedside with [facility] flat requiring assistance mostly due to RUE  pain/discomfort, good return for ambulating in room without loss of balance using RW and tolerated sitting up in chair after therapy.  Patient will benefit from continued skilled physical therapy in hospital and recommended venue below to increase strength, balance, endurance for safe ADLs and gait.         If plan is discharge home, recommend the following: A little help with walking and/or transfers;A little help with bathing/dressing/bathroom;Help with stairs or ramp for entrance;Assistance with cooking/housework   Can travel by private vehicle        Equipment Recommendations None recommended by PT  Recommendations for Other Services       Functional Status Assessment Patient has had a recent decline in their functional status and demonstrates the ability to make significant improvements in function in a reasonable and predictable amount of time.     Precautions / Restrictions Precautions Precautions: Fall Restrictions Weight Bearing Restrictions: No      Mobility  Bed Mobility Overal bed mobility: Needs Assistance Bed Mobility: Supine to Sit, Sit to Supine     Supine to sit: Contact guard, Min assist Sit to supine: Modified independent (Device/Increase time)   General bed mobility comments: labored movement requiring assistance for sitting up from flat bed    Transfers Overall transfer level: Needs assistance Equipment used: Rolling walker (2 wheels) Transfers: Sit to/from Stand, Bed to chair/wheelchair/BSC Sit to  Stand: Supervision   Step pivot transfers: Supervision, Contact guard assist       General transfer comment: slightly labored movement with good return for using RW    Ambulation/Gait Ambulation/Gait assistance: Supervision Gait Distance (Feet): 65 Feet Assistive device: Rolling walker (2 wheels) Gait Pattern/deviations: Decreased step length - right, Decreased step length - left, Decreased stride length Gait velocity: decreased     General  Gait Details: slightly labored cadence wtihout loss of balance ambulating in room  Stairs            Wheelchair Mobility     Tilt Bed    Modified Rankin (Stroke Patients Only)       Balance Overall balance assessment: Needs assistance Sitting-balance support: Feet supported, No upper extremity supported Sitting balance-Leahy Scale: Good Sitting balance - Comments: seated at EOB   Standing balance support: During functional activity, Bilateral upper extremity supported Standing balance-Leahy Scale: Fair Standing balance comment: fair/good using RW                             Pertinent Vitals/Pain Pain Assessment Pain Assessment: Faces Faces Pain Scale: Hurts a little bit Pain Location: over bottom, and hands Pain Descriptors / Indicators: Discomfort, Sore Pain Intervention(s): Limited activity within patient's tolerance, Monitored during session, Repositioned    Home Living Family/patient expects to be discharged to:: Private residence Living Arrangements: Alone Available Help at Discharge: Family;Available 24 hours/day Type of Home: House Home Access: Ramped entrance       Home Layout: One level Home Equipment: Agricultural consultant (2 wheels);Transport chair;Cane - single point;BSC/3in1;Shower seat;Grab bars - tub/shower      Prior Function Prior Level of Function : Independent/Modified Independent             Mobility Comments: Community ambulator without AD. Does not drive. ADLs Comments: Independent ADL and IADL.     Extremity/Trunk Assessment   Upper Extremity Assessment Upper Extremity Assessment: Overall WFL for tasks assessed    Lower Extremity Assessment Lower Extremity Assessment: Generalized weakness    Cervical / Trunk Assessment Cervical / Trunk Assessment: Normal  Communication   Communication Communication: No apparent difficulties  Cognition Arousal: Alert Behavior During Therapy: WFL for tasks  assessed/performed Overall Cognitive Status: Within Functional Limits for tasks assessed                                          General Comments      Exercises     Assessment/Plan    PT Assessment Patient needs continued PT services  PT Problem List Decreased strength;Decreased activity tolerance;Decreased balance;Decreased mobility       PT Treatment Interventions Gait training;DME instruction;Stair training;Functional mobility training;Therapeutic activities;Therapeutic exercise;Balance training;Patient/family education    PT Goals (Current goals can be found in the Care Plan section)  Acute Rehab PT Goals Patient Stated Goal: return home with family to assist PT Goal Formulation: With patient Time For Goal Achievement: 01/25/23 Potential to Achieve Goals: Good    Frequency Min 3X/week     Co-evaluation               AM-PAC PT "6 Clicks" Mobility  Outcome Measure Help needed turning from your back to your side while in a flat bed without using bedrails?: None Help needed moving from lying on your back to sitting on the side of  a flat bed without using bedrails?: A Little Help needed moving to and from a bed to a chair (including a wheelchair)?: A Little Help needed standing up from a chair using your arms (e.g., wheelchair or bedside chair)?: None Help needed to walk in hospital room?: A Little Help needed climbing 3-5 steps with a railing? : A Little 6 Click Score: 20    End of Session   Activity Tolerance: Patient tolerated treatment well;Patient limited by fatigue Patient left: in chair;with call bell/phone within reach Nurse Communication: Mobility status PT Visit Diagnosis: Unsteadiness on feet (R26.81);Other abnormalities of gait and mobility (R26.89);Muscle weakness (generalized) (M62.81)    Time: 1010-1035 PT Time Calculation (min) (ACUTE ONLY): 25 min   Charges:   PT Evaluation $PT Eval Moderate Complexity: 1 Mod PT  Treatments $Therapeutic Activity: 23-37 mins PT General Charges $$ ACUTE PT VISIT: 1 Visit         1:43 PM, 01/18/23 Ocie Bob, MPT Physical Therapist with [facility] [phone] office [phone] mobile phone

## 2023-01-18 NOTE — Plan of Care (Signed)

## 2023-01-18 NOTE — Assessment & Plan Note (Signed)
Continue pain control 

## 2023-01-18 NOTE — H&P (Signed)
History and Physical    Patient: Sheri Pope WUJ:[id number] DOB: 05/02/1940 DOA: 01/17/2023 DOS: the patient was seen and examined on 01/18/2023 PCP: Benita Stabile, MD  Patient coming from: Home  Chief Complaint:  Chief Complaint  Patient presents with   Fatigue   HPI: Sheri Pope is a 82 y.o. female with medical history significant of rheumatoid arthritis, GERD, hyperlipidemia, lymphocytic leukemia, tobacco use disorder, and more presents the ED with a chief complaint of generalized weakness and falls.  Patient reports she fell twice on Monday and fell once yesterday.  Each of these falls she did not even know it was coming, just next and she knew she was on the ground.  She reports she did not lose consciousness.  She did not hit her head.  She reports that she has had a fever at home of 102.6 at the highest.  She has not taken any extra Tylenol for that because she is already taking hydrocodone for her rheumatoid arthritis pain.  She reports prior to going to the PCP this a.m. her temp was 100.1.  At the PCP she did not have a fever.  Here in the ER she has not had a fever.  Patient reports she has had loose stools x 2.  No abdominal pain.  She has had nausea but no vomiting.  She is not taking anything for nausea because she reports that she would rather take less medication if possible.  She does have antiemetic prescription at home per her report.  Patient denies any chest pain, dizziness, dyspnea.  She reports that a couple of her falls happened while going to sit on the bedside commode.  This would indicate a proximal muscle weakness, but she also had a fall while she was holding onto the walker.  She has pain in her right elbow and left elbow.  She hit each of those when falling.  She reports the pain is mildly worse than normal.  She has had no cough, congestion.  No new rashes.  Patient has no other complaints at this time.  Patient does smoke, she does not drink alcohol.  Patient  reports that she is DNR and ready to meet Jesus. Review of Systems: As mentioned in the history of present illness. All other systems reviewed and are negative. Past Medical History:  Diagnosis Date   Arthritis    RA AND OA --PAIN AND SWELLIN IN LEFT KNEE   Blood in urine    NEGATIVE UROLOGY WORK UP --BUT ALWAYS HAS BLOOD IN URINE   GERD (gastroesophageal reflux disease)    H/O hiatal hernia    Hyperlipidemia    Leukemia (HCC)    PONV (postoperative nausea and vomiting)    ALWAYS SICK AFTER SURGERIES EXCEPT AFTER HIP REPLACMENT 2007   Vitamin D deficiency    Past Surgical History:  Procedure Laterality Date   ABDOMINAL HYSTERECTOMY  1870'S   BREAST SURGERY     BREAST BIOSPIES   CATARACT EXTRACTION W/PHACO Left 05/30/2012   Procedure: CATARACT EXTRACTION PHACO AND INTRAOCULAR LENS PLACEMENT (IOC);  Surgeon: Gemma Payor, MD;  Location: [facility];  Service: Ophthalmology;  Laterality: Left;  CDE: 16.99   CATARACT EXTRACTION W/PHACO Right 06/24/2012   Procedure: CATARACT EXTRACTION PHACO AND INTRAOCULAR LENS PLACEMENT (IOC);  Surgeon: Gemma Payor, MD;  Location: [facility];  Service: Ophthalmology;  Laterality: Right;  CDE:17.92   COLONOSCOPY WITH PROPOFOL N/A 10/14/2020   Procedure: COLONOSCOPY WITH PROPOFOL;  Surgeon: Corbin Ade, MD;  Location: [facility];  Service: Endoscopy;  Laterality: N/A;  ASA III / 12:30   ESOPHAGOGASTRODUODENOSCOPY (EGD) WITH PROPOFOL N/A 10/14/2020   Procedure: ESOPHAGOGASTRODUODENOSCOPY (EGD) WITH PROPOFOL;  Surgeon: Corbin Ade, MD;  Location: [facility];  Service: Endoscopy;  Laterality: N/A;   HIP SURGERY     JOINT REPLACEMENT  2007   RIGHT HIP REPLACEMENT   LEFT KNEE ARTHROSCOPY  AUG 2012   MALONEY DILATION  10/14/2020   Procedure: MALONEY DILATION;  Surgeon: Corbin Ade, MD;  Location: [facility];  Service: Endoscopy;;   MULTIPLE ORTHOPEDIC SURGERIES     ON BOTH HANDS AND BOTH FEET, RIGHT ELBOW   SPLEENECTOMY  IN THE 70'S   FOR LARGE CYST    TOTAL KNEE ARTHROPLASTY  11/10/2011   Procedure: TOTAL KNEE ARTHROPLASTY;  Surgeon: Kathryne Hitch, MD;  Location: [facility];  Service: Orthopedics;  Laterality: Left;  Left Total Knee Arthroplasty   YAG LASER APPLICATION Bilateral    Dr. Alto Denver   Social History:  reports that she has been smoking cigarettes. She has a 50 pack-year smoking history. She has never used smokeless tobacco. She reports that she does not drink alcohol and does not use drugs.  Allergies  Allergen Reactions   Codeine Nausea And Vomiting   Prednisone Other (See Comments)    Thought she was having a heart attack   Sulfa Antibiotics Other (See Comments)    unknown   Aleve [Naproxen] Anxiety    Jittery and hot flashes   Latex Rash    Family History  Problem Relation Age of Onset   Diabetes Sister    Macular degeneration Sister    Heart attack Mother    Stroke Father    Dementia Father    Cancer Brother    Healthy Sister    Healthy Sister    Diabetes Brother     Prior to Admission medications   Medication Sig Start Date End Date Taking? Authorizing Provider  acetaminophen (TYLENOL) 325 MG tablet Take 2 tablets (650 mg total) by mouth every 6 (six) hours as needed for mild pain (or Fever >/= 101). 03/28/22  Yes Angiulli, Mcarthur Rossetti, PA-C  ALPRAZolam Prudy Feeler) 0.25 MG tablet Take 1 tablet (0.25 mg total) by mouth at bedtime. 03/28/22  Yes Angiulli, Mcarthur Rossetti, PA-C  aluminum-magnesium hydroxide 200-200 MG/5ML suspension Take 10 mLs by mouth every 6 (six) hours as needed for indigestion.   Yes [provider]  amoxicillin-clavulanate (AUGMENTIN) 875-125 MG tablet Take 1 tablet by mouth 2 (two) times daily. 01/17/23  Yes [provider]  aspirin 81 MG chewable tablet Chew 1 tablet (81 mg total) by mouth daily. 03/21/22  Yes Johnson, Clanford L, MD  HYDROcodone-acetaminophen (NORCO/VICODIN) 5-325 MG tablet Take 0.5 tablets by mouth 2 (two) times daily as needed for moderate pain. 03/28/22  Yes  Angiulli, Mcarthur Rossetti, PA-C  losartan (COZAAR) 25 MG tablet Take 25 mg by mouth daily. 04/04/22  Yes [provider]  methocarbamol (ROBAXIN) 500 MG tablet Take 1 tablet (500 mg total) by mouth 2 (two) times daily. 03/28/22  Yes Angiulli, Mcarthur Rossetti, PA-C  ondansetron (ZOFRAN-ODT) 4 MG disintegrating tablet Take 1 tablet (4 mg total) by mouth every 8 (eight) hours as needed for nausea or vomiting. Patient not taking: Reported on 01/17/2023 01/05/23   Mauro Kaufmann, NP    Physical Exam: Vitals:   01/17/23 2230 01/17/23 2245 01/17/23 2308 01/17/23 2326  BP: (!) 131/58 134/67 129/67   Pulse: 82  88 88   Resp: 20 20    Temp:    98.8 F (37.1 C)  TempSrc:    Oral  SpO2: 91% 92% 93%   Weight:      Height:       1.  General: Patient lying supine in bed,  no acute distress   2. Psychiatric: Alert and oriented x 3, mood and behavior normal for situation, pleasant and cooperative with exam   3. Neurologic: Speech and language are normal, face is symmetric, moves all 4 extremities voluntarily, at baseline without acute deficits on limited exam   4. HEENMT:  Head is atraumatic, normocephalic, pupils reactive to light, neck is supple, trachea is midline, mucous membranes are moist   5. Respiratory : Lungs are clear to auscultation bilaterally without wheezing, rhonchi, rales, no cyanosis, no increase in work of breathing or accessory muscle use   6. Cardiovascular : Heart rate normal, rhythm is regular, no murmurs, rubs or gallops, no peripheral edema, peripheral pulses palpated   7. Gastrointestinal:  Abdomen is soft, nondistended, nontender to palpation bowel sounds active, no masses or organomegaly palpated   8. Skin:  Skin is warm, dry and intact without rashes, acute lesions, or ulcers on limited exam   9.Musculoskeletal:  Severe arthritic changes in the upper extremities, hands especially Data Reviewed: In the ED Patient is afebrile, heart rate 85-96, respiratory rate  18-19, blood pressure 119/56-iron 28/57, satting 95-97% Neutropenia with a white blood cell count of 3.8, absolute neutrophil count of 0.4 Hemoglobin 11.6 Chemistry reveals a hyponatremia at 118, hypochloremia at 90, decreased bicarb at 21 EKG shows a heart rate 87, sinus rhythm, QTc 450 Chest x-ray shows no acute cardiopulmonary changes but does have chronic emphysematous changes X-ray elbow shows no definite fracture but moderate osteoarthritis seen Admission requested for acute hyponatremia  Assessment and Plan: * Acute hyponatremia - Sodium 118 - This is down from 127 earlier this month - Continue gentle hydration - Continue serial BMP - Continue sodium tab with meals - Urine and serum osmolality pending - Monitor on telemetry  Rheumatoid arthritis (HCC) - Continue pain control  Essential hypertension - Continue losartan  Fall at home, initial encounter - Secondary to generalized weakness - Likely related to hyponatremia - PT eval and treat - See plan for hyponatremia  Tobacco abuse - Smokes a pack per day - Counseled on importance of cessation - Nicotine patch ordered  Anxiety - Continue Xanax as needed  Neutropenia (HCC) - Followed by oncology - History of large granular lymphocytic leukemia and splenectomy at age 14 - Bone marrow biopsy in June 2022 showed normocellular bone marrow for age, moderate involvement by interstitial T-cell infiltrates and 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 - Intermittent fevers reported in May 2024 - In May 2024 she also had a critical neutropenia with a absolute neutrophil count of 0.1, today her neutrophil count was 0.4 - Patient refused treatment with methotrexate and prednisone - Procalcitonin is not indicative of bacterial infection - No fever at the time of admission - Continue to monitor      Advance Care Planning:   Code Status: Limited: Do not attempt resuscitation (DNR) -DNR-LIMITED  -Do Not Intubate/DNI   Consults: None at this time  Family Communication: No family at bedside  Severity of Illness: The appropriate patient status for this patient is INPATIENT. Inpatient status is judged to be reasonable and necessary in order to provide the required intensity of service to ensure the  patient's safety. The patient's presenting symptoms, physical exam findings, and initial radiographic and laboratory data in the context of their chronic comorbidities is felt to place them at high risk for further clinical deterioration. Furthermore, it is not anticipated that the patient will be medically stable for discharge from the hospital within 2 midnights of admission.   * I certify that at the point of admission it is my clinical judgment that the patient will require inpatient hospital care spanning beyond 2 midnights from the point of admission due to high intensity of service, high risk for further deterioration and high frequency of surveillance required.*  Author: Lilyan Gilford, DO 01/18/2023 2:57 AM  For on call review [email or web address].

## 2023-01-18 NOTE — Progress Notes (Signed)
[facility] PROGRESS NOTE  Sheri Pope (DOB: [birth year]) LKG:[id number] PCP: Benita Stabile, MD Outpatient Specialists: Oncology, Dr. Ellin Saba  Brief Narrative: Sheri Pope is an 82 y.o. female with a history of RA, lymphocytic leukemia with chronic neutropenia, and tobacco use who presented to the ED on 01/17/2023 on the advice of outpatient provider due to sodium level of 117. She's felt fatigued with poor appetite of late but has been drinking adequate fluids, and the only other issue was that she developed loose stools recently. She was admitted this morning.  Subjective: 4 episodes of watery stools overnight, no abd pain, no urinary or respiratory symptoms. No fevers. Feels "fine." Wants to go home.   Objective: BP (!) 135/47   Pulse 88   Temp 98.4 F (36.9 C) (Oral)   Resp 19   Ht 5\' 1"  (1.549 m)   Wt 43.5 kg   SpO2 100%   BMI 18.14 kg/m   Gen: No distress, elderly Pulm: Clear, nonlabored  CV: RRR, no edema GI: Soft, NT, ND, +BS  Neuro: Alert and oriented. No new focal deficits. Ext: Warm, dry. Severe swan neck, ulnar deviation deformities of hands with proximal phalangeal joint swelling and tophi/nodules. Right dorsal MCP edema with tender erythema and eschar without exudate noted.  Assessment & Plan: Principal Problem:   Acute hyponatremia Active Problems:   Rheumatoid arthritis (HCC)   Neutropenia (HCC)   Anxiety   Tobacco abuse   Fall at home, initial encounter   Essential hypertension  Leukemia, neutropenia: UA and CXR are clear, PCT reassuring.. She does have evidence of nonpurulent cellulitis complicating a wound to her right hand. Says many antibiotics are not well tolerated and she's already having loose stools. She states amoxicillin has been well tolerated. Specifically says bactrim and augmentin and keflex have caused problems before.  - Start amoxicillin and monitor. Considered more broad coverage given her immunocompromised status, but pt  declines that.  - Follow up as scheduled with Dr. Ellin Saba on 12/5.   Right elbow pain: Significant arthritis without acute bony deformity on exam or fracture/dislocation on XR.  - Supportive care  Diarrhea:  - Check C. diff, GI pathogen panel, enteric precautions.   Hyponatremia: Suspect related to relative solute deficiency acutely as it has improved with gentle isotonic saline and BUN:Cr also declining. At goal rate currently, still severely low, asymptomatic.  - Osm's still pending, though will plan to complete the rest of this liter of saline and monitor serially. Continue salt tabs and diet ad lib.   RA:  - Pain control  Tobacco use:  - Cessation counseling, nicotine patch  Anxiety: Quiescent currently, continue alprazolam.   DNR: Confirmed POA. Ready to meet Jesus.  Tyrone Nine, MD [facility] [email or web address] 01/18/2023, 11:09 AM

## 2023-01-18 NOTE — Assessment & Plan Note (Signed)
Continue Xanax as needed 

## 2023-01-19 ENCOUNTER — Inpatient Hospital Stay (HOSPITAL_COMMUNITY): Payer: Medicare HMO

## 2023-01-19 ENCOUNTER — Other Ambulatory Visit (HOSPITAL_COMMUNITY): Payer: Self-pay | Admitting: *Deleted

## 2023-01-19 DIAGNOSIS — I4891 Unspecified atrial fibrillation: Secondary | ICD-10-CM | POA: Diagnosis not present

## 2023-01-19 DIAGNOSIS — E871 Hypo-osmolality and hyponatremia: Secondary | ICD-10-CM | POA: Diagnosis not present

## 2023-01-19 LAB — GASTROINTESTINAL PANEL BY PCR, STOOL (REPLACES STOOL CULTURE)

## 2023-01-19 LAB — CBC
HCT: 33.6 % — ABNORMAL LOW (ref 36.0–46.0)
Hemoglobin: 11.9 g/dL — ABNORMAL LOW (ref 12.0–15.0)
MCH: 33.8 pg (ref 26.0–34.0)
MCHC: 35.4 g/dL (ref 30.0–36.0)
MCV: 95.5 fL (ref 80.0–100.0)
Platelets: 326 10*3/uL (ref 150–400)
RBC: 3.52 MIL/uL — ABNORMAL LOW (ref 3.87–5.11)
RDW: 15.5 % (ref 11.5–15.5)
WBC: 4.1 10*3/uL (ref 4.0–10.5)
nRBC: 0 % (ref 0.0–0.2)

## 2023-01-19 LAB — BASIC METABOLIC PANEL
Anion gap: 8 (ref 5–15)
BUN: 10 mg/dL (ref 8–23)
CO2: 20 mmol/L — ABNORMAL LOW (ref 22–32)
Calcium: 8.3 mg/dL — ABNORMAL LOW (ref 8.9–10.3)
Chloride: 98 mmol/L (ref 98–111)
Creatinine, Ser: 0.48 mg/dL (ref 0.44–1.00)
GFR, Estimated: >60 mL/min (ref >60)
Glucose, Bld: 105 mg/dL — ABNORMAL HIGH (ref 70–99)
Potassium: 3.5 mmol/L (ref 3.5–5.1)
Sodium: 126 mmol/L — ABNORMAL LOW (ref 135–145)

## 2023-01-19 LAB — ECHOCARDIOGRAM COMPLETE
Area-P 1/2: 3.65 cm2
Height: 61 in
S' Lateral: 3.1 cm
Single Plane A4C EF: 54.7 %
Weight: 1536 [oz_av]

## 2023-01-19 LAB — C DIFFICILE QUICK SCREEN W PCR REFLEX
C Diff antigen: NEGATIVE
C Diff interpretation: NOT DETECTED
C Diff toxin: NEGATIVE

## 2023-01-19 LAB — MAGNESIUM: Magnesium: 1.8 mg/dL (ref 1.7–2.4)

## 2023-01-19 MED ORDER — METOPROLOL TARTRATE 25 MG PO TABS
12.5000 mg | ORAL_TABLET | Freq: Two times a day (BID) | ORAL | Status: DC
  Administered 2023-01-19: 12.5 mg via ORAL
  Filled 2023-01-19: qty 1

## 2023-01-19 MED ORDER — LACTATED RINGERS IV BOLUS
500.0000 mL | Freq: Once | INTRAVENOUS | Status: AC
  Administered 2023-01-19: 500 mL via INTRAVENOUS

## 2023-01-19 MED ORDER — VALACYCLOVIR HCL 500 MG PO TABS
1000.0000 mg | ORAL_TABLET | Freq: Two times a day (BID) | ORAL | Status: DC
  Filled 2023-01-19 (×7): qty 2

## 2023-01-19 MED ORDER — CIPROFLOXACIN HCL 250 MG PO TABS: 250.0000 mg | ORAL_TABLET | Freq: Two times a day (BID) | ORAL | 0 refills | Status: AC

## 2023-01-19 MED ORDER — VALACYCLOVIR HCL 1 G PO TABS: 1000.0000 mg | ORAL_TABLET | Freq: Two times a day (BID) | ORAL | 0 refills | Status: AC

## 2023-01-19 MED ORDER — AMOXICILLIN 500 MG PO CAPS: 500.0000 mg | ORAL_CAPSULE | Freq: Three times a day (TID) | ORAL | 0 refills | Status: DC

## 2023-01-19 MED ORDER — SODIUM CHLORIDE 1 G PO TABS: 1.0000 g | ORAL_TABLET | Freq: Three times a day (TID) | ORAL | 0 refills | Status: AC

## 2023-01-19 MED ORDER — METOPROLOL TARTRATE 25 MG PO TABS: 12.5000 mg | ORAL_TABLET | Freq: Two times a day (BID) | ORAL | 0 refills | Status: AC

## 2023-01-19 MED ORDER — METOPROLOL TARTRATE 5 MG/5ML IV SOLN
2.5000 mg | INTRAVENOUS | Status: DC | PRN
  Administered 2023-01-19: 2.5 mg via INTRAVENOUS
  Filled 2023-01-19: qty 5

## 2023-01-19 NOTE — TOC Transition Note (Signed)
Transition of Care [facility]) - CM/SW Discharge Note   Patient Details  Name: Sheri Pope MRN: [id number] Date of Birth: 11-30-1940  Transition of Care [facility]) CM/SW Contact:  Beather Arbour Phone Number: 01/19/2023, 10:53 AM   Clinical Narrative:   CSW spoke with patient and niece. Patient is agreeable to [facility] PT and aide. Home Health ordered through [facility] and Denyse Amass has accepted referral. MD made aware of order needed for PT and [facility] Aide. Niece will provide transportation.     Final next level of care: Home w Home Health Services Barriers to Discharge: Barriers Resolved   Patient Goals and CMS Choice      Discharge Placement      Discharge Plan and Services Additional resources added to the After Visit Summary for         [facility] Arranged: PT, Nurse's Aide HH Agency: [facility] Date [facility] Agency Contacted: 01/19/23 Time HH Agency Contacted: 1047 Representative spoke with at [facility] Agency: Denyse Amass  Social Determinants of Health (SDOH) Interventions SDOH Screenings   Food Insecurity: No Food Insecurity (01/18/2023)  Housing: Low Risk  (01/18/2023)  Transportation Needs: No Transportation Needs (01/18/2023)  Utilities: Not At Risk (01/18/2023)  Depression (PHQ2-9): Low Risk  (04/12/2022)  Tobacco Use: High Risk (01/17/2023)     Readmission Risk Interventions    03/15/2022    1:30 PM  Readmission Risk Prevention Plan  Post Dischage Appt Not Complete  Medication Screening Complete  Transportation Screening Complete

## 2023-01-19 NOTE — Care Management Important Message (Signed)
Important Message  Patient Details  Name: Sheri Pope MRN: [id number] Date of Birth: Sep 05, 1940   Important Message Given:  Yes - Medicare IM     Corey Harold 01/19/2023, 11:13 AM

## 2023-01-19 NOTE — Progress Notes (Addendum)
Pt went into atrial fibrillation (appears to be new) with HR 130-140 and BP 92/59. She denied chest pain or SOB. Plan to give 500 mL fluid bolus, check electrolytes (TSH was 6.059 yesterday and free T4 1.39 on 01/15/23), hold losartan, and use Lopressor injections as-needed. CHADS-VASc is at least 32 (age x2, gender) and anticoagulation will need to be considered.

## 2023-01-19 NOTE — Progress Notes (Signed)
  Echocardiogram 2D Echocardiogram has been performed.  Sheri Pope 01/19/2023, 10:15 AM

## 2023-01-19 NOTE — Discharge Summary (Signed)
Physician Discharge Summary   Patient: Sheri Pope MRN: [id number] DOB: Oct 03, 1940  Admit date:     01/17/2023  Discharge date: 01/19/23  Discharge Physician: Tyrone Nine   PCP: Benita Stabile, MD   Recommendations at discharge:  Follow up with PCP in the next week for recheck BMP with attention to hyponatremia, discharged on salt tabs, fluid restriction.  Note new onset atrial fibrillation. This was asymptomatic, so unclear actual duration, converted to NSR quickly with metoprolol. Declines anticoagulation.  Continue goals of care discussions. Every aspect of this patient's care will need to be tailored to her consistent, insistent desire to maintain quality of life and minimize medication use.  She has follow up with heme/onc again on 12/5, though it seems she's not even considering treatment for leukemia, so it may be prudent to stop monitoring labs unless her desires change.  Discharge Diagnoses: Principal Problem:   Acute hyponatremia Active Problems:   Rheumatoid arthritis (HCC)   Neutropenia (HCC)   Anxiety   Tobacco abuse   Fall at home, initial encounter   Essential hypertension  Hospital Course: Sheri Pope is an 82 y.o. female with a history of RA, lymphocytic leukemia with chronic neutropenia, declining treatments, and tobacco use who presented to the ED on 01/17/2023 on the advice of outpatient provider due to sodium level of 117. She's felt fatigued with poor appetite of late but has been drinking adequate fluids, reports several falls earlier in the week. She was admitted with IVF and salt tablets, sodium level has improved at goal rate, though only to 126.  Sustained injury, skin abrasion over her right dorsal MCP joints and developed tender erythema for which amoxicillin was started with improvement. Also developed herpetic lesion on upper lip for which valacyclovir is started.   Assessment and Plan: Leukemia, neutropenia, cellulitis: UA and CXR are clear, PCT  reassuring. She does have evidence of nonpurulent cellulitis complicating a wound to her right hand. Says many antibiotics are not well tolerated and she's already having loose stools. She states amoxicillin has been well tolerated. Specifically says bactrim and augmentin and keflex have caused problems before.  - Started amoxicillin with clinical improvement, no exudate, will continue at discharge. Considered more broad coverage given her immunocompromised status, but pt declines that.  - Follow up as scheduled with Dr. Ellin Saba on 12/5.    Cold sore in nonimmunocompetent pt.  - Treat with valacyclovir.   PAF: Asymptomatic, so unclear whether this is truly her first episode or not.  - Start metoprolol, BP actually elevated so can continue ARB as well. Maintaining NSR since giving BB.  - Declines anticoagulation, will continue ASA.  - Echo was unremarkable.   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  - Cipro lowest dose x5 days per guidelines for immunosuppressed patients.   Right elbow pain: Significant arthritis without acute bony deformity on exam or fracture/dislocation on XR.  - Supportive care   Diarrhea: C. diff negative, GI pathogen panel pending, though symptoms are improving and abd exam is benign.     Hyponatremia: Suspect related to relative solute deficiency acutely as it has improved with gentle isotonic saline and BUN:Cr also declining. At goal rate currently, still low but improved and pt requesting discharge, feels steadier on her feet.  - Continue salt tabs and suggest she get this rechecked early next week for titration of dose. Also recommended fluid restriction though she may or may not abide.  RA:  - Pain control   Tobacco use:  - Cessation counseling, nicotine patch   Anxiety: Quiescent currently, continue alprazolam.    DNR: Confirmed POA. Ready to meet Jesus. Does not want risk of more bleeding from blood  thinner in exchange for reduced stroke risk. Does not ever want to pursue treatment for other conditions either. Politely declines consideration to remain in the hospital an additional day. She may or may not strictly adhere to fluid restriction.   Consultants: None Procedures performed: Echocardiogram   Disposition: Home Diet recommendation: Fluid restriction DISCHARGE MEDICATION: Allergies as of 01/19/2023       Reactions   Codeine Nausea And Vomiting   Prednisone Other (See Comments)   Thought she was having a heart attack   Sulfa Antibiotics Other (See Comments)   unknown   Aleve [naproxen] Anxiety   Jittery and hot flashes   Latex Rash        Medication List     STOP taking these medications    amoxicillin-clavulanate 875-125 MG tablet Commonly known as: AUGMENTIN       TAKE these medications    acetaminophen 325 MG tablet Commonly known as: TYLENOL Take 2 tablets (650 mg total) by mouth every 6 (six) hours as needed for mild pain (or Fever >/= 101).   ALPRAZolam 0.25 MG tablet Commonly known as: XANAX Take 1 tablet (0.25 mg total) by mouth at bedtime.   aluminum-magnesium hydroxide 200-200 MG/5ML suspension Take 10 mLs by mouth every 6 (six) hours as needed for indigestion.   amoxicillin 500 MG capsule Commonly known as: AMOXIL Take 1 capsule (500 mg total) by mouth every 8 (eight) hours.   aspirin 81 MG chewable tablet Chew 1 tablet (81 mg total) by mouth daily.   ciprofloxacin 250 MG tablet Commonly known as: CIPRO Take 1 tablet (250 mg total) by mouth 2 (two) times daily for 5 days.   HYDROcodone-acetaminophen 5-325 MG tablet Commonly known as: NORCO/VICODIN Take 0.5 tablets by mouth 2 (two) times daily as needed for moderate pain.   losartan 25 MG tablet Commonly known as: COZAAR Take 25 mg by mouth daily.   methocarbamol 500 MG tablet Commonly known as: ROBAXIN Take 1 tablet (500 mg total) by mouth 2 (two) times daily.   metoprolol  tartrate 25 MG tablet Commonly known as: LOPRESSOR Take 0.5 tablets (12.5 mg total) by mouth 2 (two) times daily.   ondansetron 4 MG disintegrating tablet Commonly known as: ZOFRAN-ODT Take 1 tablet (4 mg total) by mouth every 8 (eight) hours as needed for nausea or vomiting.   sodium chloride 1 g tablet Take 1 tablet (1 g total) by mouth 3 (three) times daily with meals.   valACYclovir 1000 MG tablet Commonly known as: VALTREX Take 1 tablet (1,000 mg total) by mouth 2 (two) times daily for 7 days.        Follow-up Information     [facility] Follow up.   Specialty: Home Health Services Why: They will follow up with you for start of service Contact information: [address] [place] [address] [phone]         Benita Stabile, MD Follow up.   Specialty: Internal Medicine Contact information: [address] [place] [facility] [address] [phone]         Doreatha Massed, MD Follow up.   Specialty: Hematology Contact information: [address] [place] [address] [phone]  Discharge Exam: Filed Weights   01/17/23 1846  Weight: 43.5 kg  BP 137/78   Pulse 75   Temp 98.1 F (36.7 C) (Oral)   Resp (!) 26   Ht 5\' 1"  (1.549 m)   Wt 43.5 kg   SpO2 100%   BMI 18.14 kg/m   Frail, small elderly female in no distress.  Animated, alert, oriented, nonfocal Clear, nonlabored RRR, NSR on monitor, no JVD or pitting edema Severe swan neck, ulnar deviation deformities of hands with proximal phalangeal joint swelling and tophi/nodules involving hands and forearm. Right dorsal MCP edema with tender erythema and eschar without exudate noted. This is less red and less tender since starting amoxicillin. Small cold sore on medial upper lip, upper dentures in good position, poor dentition, no pharyngeal or lingual lesions Abdomen is soft, NT, ND, +BS.  Condition at discharge: stable  The results of significant  diagnostics from this hospitalization (including imaging, microbiology, ancillary and laboratory) are listed below for reference.   Imaging Studies: ECHOCARDIOGRAM COMPLETE  Result Date: 01/19/2023    ECHOCARDIOGRAM REPORT   Patient Name:   Sheri Pope Date of Exam: 01/19/2023 Medical Rec #:  [id number]        Height:       61.0 in Accession #:    [id number]       Weight:       96.0 lb Date of Birth:  22-Jul-1940         BSA:          1.383 m Patient Age:    82 years         BP:           144/59 mmHg Patient Gender: F                HR:           81 bpm. Exam Location:  Inpatient Procedure: 2D Echo, Color Doppler and Cardiac Doppler Indications:    Atrial fibrillation  History:        Patient has prior history of Echocardiogram examinations, most                 recent 03/14/2022. Risk Factors:Current Smoker, Hypertension and                 Dyslipidemia.  Sonographer:    Delcie Roch RDCS Referring Phys: [id number] Tyrone Nine IMPRESSIONS  1. Left ventricular ejection fraction, by estimation, is 50 to 55%. The left ventricle has low normal function. The left ventricle has no regional wall motion abnormalities. Left ventricular diastolic parameters were normal.  2. Right ventricular systolic function is normal. The right ventricular size is normal. There is normal pulmonary artery systolic pressure.  3. The mitral valve is normal in structure. Mild mitral valve regurgitation. No evidence of mitral stenosis.  4. The aortic valve is tricuspid. Aortic valve regurgitation is moderate. No aortic stenosis is present.  5. The inferior vena cava is dilated in size with >50% respiratory variability, suggesting right atrial pressure of 8 mmHg. Comparison(s): No significant change from prior study. FINDINGS  Left Ventricle: Left ventricular ejection fraction, by estimation, is 50 to 55%. The left ventricle has low normal function. The left ventricle has no regional wall motion abnormalities. The left ventricular  internal cavity size was normal in size. There is no left ventricular hypertrophy. Left ventricular diastolic parameters were normal. Right Ventricle: The right ventricular size is normal. No increase in right ventricular wall thickness.  Right ventricular systolic function is normal. There is normal pulmonary artery systolic pressure. The tricuspid regurgitant velocity is 2.33 m/s, and  with an assumed right atrial pressure of 8 mmHg, the estimated right ventricular systolic pressure is 29.7 mmHg. Left Atrium: Left atrial size was normal in size. Right Atrium: Right atrial size was normal in size. Pericardium: There is no evidence of pericardial effusion. Mitral Valve: The mitral valve is normal in structure. Mild mitral valve regurgitation. No evidence of mitral valve stenosis. Tricuspid Valve: The tricuspid valve is normal in structure. Tricuspid valve regurgitation is mild . No evidence of tricuspid stenosis. Aortic Valve: The aortic valve is tricuspid. Aortic valve regurgitation is moderate. No aortic stenosis is present. Pulmonic Valve: The pulmonic valve was not well visualized. Pulmonic valve regurgitation is not visualized. No evidence of pulmonic stenosis. Aorta: The aortic root is normal in size and structure. Venous: The inferior vena cava is dilated in size with greater than 50% respiratory variability, suggesting right atrial pressure of 8 mmHg. IAS/Shunts: No atrial level shunt detected by color flow Doppler.  LEFT VENTRICLE PLAX 2D LVIDd:         4.30 cm     Diastology LVIDs:         3.10 cm     LV e' medial:    8.38 cm/s LV PW:         1.00 cm     LV E/e' medial:  8.6 LV IVS:        0.90 cm     LV e' lateral:   8.81 cm/s LVOT diam:     1.70 cm     LV E/e' lateral: 8.2 LV SV:         43 LV SV Index:   31 LVOT Area:     2.27 cm  LV Volumes (MOD) LV vol d, MOD A4C: 40.8 ml LV vol s, MOD A4C: 18.5 ml LV SV MOD A4C:     40.8 ml RIGHT VENTRICLE             IVC RV Basal diam:  2.10 cm     IVC diam: 2.60 cm  RV S prime:     12.20 cm/s TAPSE (M-mode): 2.3 cm LEFT ATRIUM             Index        RIGHT ATRIUM           Index LA diam:        3.40 cm 2.46 cm/m   RA Area:     10.10 cm LA Vol (A2C):   33.4 ml 24.16 ml/m  RA Volume:   19.90 ml  14.39 ml/m LA Vol (A4C):   33.5 ml 24.23 ml/m LA Biplane Vol: 35.3 ml 25.53 ml/m  AORTIC VALVE LVOT Vmax:   84.40 cm/s LVOT Vmean:  56.100 cm/s LVOT VTI:    0.189 m  AORTA Ao Root diam: 2.40 cm MITRAL VALVE               TRICUSPID VALVE MV Area (PHT): 3.65 cm    TR Peak grad:   21.7 mmHg MV Decel Time: 208 msec    TR Vmax:        233.00 cm/s MV E velocity: 72.40 cm/s MV A velocity: 57.80 cm/s  SHUNTS MV E/A ratio:  1.25        Systemic VTI:  0.19 m  Systemic Diam: 1.70 cm Vishnu Priya Mallipeddi Electronically signed by Winfield Rast Mallipeddi Signature Date/Time: 01/19/2023/4:26:21 PM    Final    DG Chest 2 View  Result Date: 01/17/2023 CLINICAL DATA:  Fever.  Pain.  Fall.  History of COPD. EXAM: CHEST - 2 VIEW COMPARISON:  Chest radiographs 01/06/2023 in 10/21/2018 FINDINGS: Cardiac silhouette and mediastinal contours are within normal limits. Mild-to-moderate atherosclerotic calcification within the aortic arch. Flattening of the diaphragms and moderate hyperinflation, unchanged. Mild bilateral lower lung interstitial thickening is similar to prior. Increased lucencies within the bilateral upper lungs again compatible with chronic emphysematous change. No pleural effusion or thorax. Mild anterior height loss of a mid thoracic vertebral bodies unchanged from 10/21/2018 and chronic. Moderate multilevel anterior disc space narrowing greatest within the midthoracic spine. IMPRESSION: 1. No acute cardiopulmonary process. 2. Chronic emphysematous change. Electronically Signed   By: Neita Garnet M.D.   On: 01/17/2023 16:41   DG ELBOW COMPLETE RIGHT (3+VIEW)  Result Date: 01/17/2023 CLINICAL DATA:  Fall on 182024. Right elbow pain. Hand and right  elbow from fall. EXAM: RIGHT ELBOW - COMPLETE 3+ VIEW COMPARISON:  None Available. FINDINGS: There is diffuse decreased bone mineralization. No definitive elbow joint effusion is seen. Moderate radiocapitellar joint space narrowing. Mild trochlea-olecranon joint space narrowing and peripheral medial spurring. Minimal chronic enthesopathic change at the triceps insertion on the olecranon. No definite acute fracture is seen. No dislocation. IMPRESSION: 1. No definite acute fracture is seen. 2. 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 Electronically Signed   By: Neita Garnet M.D.   On: 01/17/2023 16:39   DG Chest 2 View  Result Date: 01/06/2023 CLINICAL DATA:  SOB chronic cough EXAM: CHEST - 2 VIEW COMPARISON:  10/21/2018. FINDINGS: The heart size and mediastinal contours are within normal limits. Lungs are hyperinflated suggesting COPD. There is no focal consolidation. No pneumothorax or pleural effusion. Aorta is calcified. There are thoracic degenerative changes IMPRESSION: Findings suggest COPD.  Otherwise no acute cardiopulmonary disease. Electronically Signed   By: Layla Maw M.D.   On: 01/06/2023 22:25    Microbiology: Results for orders placed or performed during the hospital encounter of 01/17/23  MRSA Next Gen by PCR, Nasal     Status: None   Collection Time: 01/18/23 12:10 AM   Specimen: Nasal Mucosa; Nasal Swab  Result Value Ref Range Status   MRSA by PCR Next Gen NOT DETECTED NOT DETECTED Final    Comment: (NOTE) The GeneXpert MRSA Assay (FDA approved for NASAL specimens only), is one component of a comprehensive MRSA colonization surveillance program. It is not intended to diagnose MRSA infection nor to guide or monitor treatment for MRSA infections. Test performance is not FDA approved in patients less than 37 years old. Performed at [facility], [address], [place], [place] [address]   C Difficile Quick Screen w PCR reflex     Status: None    Collection Time: 01/19/23  4:40 AM   Specimen: STOOL  Result Value Ref Range Status   C Diff antigen NEGATIVE NEGATIVE Final   C Diff toxin NEGATIVE NEGATIVE Final   C Diff interpretation No C. difficile detected.  Final    Comment: Performed at [facility], [address] [place], [place] [address]  Gastrointestinal Panel by PCR , Stool     Status: Abnormal   Collection Time: 01/19/23  4:40 AM   Specimen: STOOL  Result Value Ref Range Status   Campylobacter species NOT DETECTED NOT DETECTED Final  Plesimonas shigelloides NOT DETECTED NOT DETECTED Final   Salmonella species NOT DETECTED NOT DETECTED Final   Yersinia enterocolitica DETECTED (A) NOT DETECTED Final    Comment: RESULT CALLED TO, READ BACK BY AND VERIFIED WITH: ASHLEY SHELTON AT 1449 01/19/23.PMF    Vibrio species NOT DETECTED NOT DETECTED Final   Vibrio cholerae NOT DETECTED NOT DETECTED Final   Enteroaggregative E coli (EAEC) NOT DETECTED NOT DETECTED Final   Enteropathogenic E coli (EPEC) NOT DETECTED NOT DETECTED Final   Enterotoxigenic E coli (ETEC) NOT DETECTED NOT DETECTED Final   Shiga like toxin producing E coli (STEC) NOT DETECTED NOT DETECTED Final   Shigella/Enteroinvasive E coli (EIEC) NOT DETECTED NOT DETECTED Final   Cryptosporidium NOT DETECTED NOT DETECTED Final   Cyclospora cayetanensis NOT DETECTED NOT DETECTED Final   Entamoeba histolytica NOT DETECTED NOT DETECTED Final   Giardia lamblia NOT DETECTED NOT DETECTED Final   Adenovirus F40/41 NOT DETECTED NOT DETECTED Final   Astrovirus NOT DETECTED NOT DETECTED Final   Norovirus GI/GII NOT DETECTED NOT DETECTED Final   Rotavirus A NOT DETECTED NOT DETECTED Final   Sapovirus (I, II, IV, and V) NOT DETECTED NOT DETECTED Final    Comment: Performed at [facility], [address] [place], [place] [address]    Labs: CBC: Recent Labs  Lab 01/17/23 2040 01/18/23 0308 01/19/23 0516  WBC 3.8* 3.4* 4.1  NEUTROABS 0.4* 0.2*  --   HGB  11.6* 10.8* 11.9*  HCT 33.2* 31.7* 33.6*  MCV 96.0 95.8 95.5  PLT 291 299 326   Basic Metabolic Panel: Recent Labs  Lab 01/17/23 2040 01/18/23 0308 01/18/23 0835 01/18/23 1736 01/19/23 0516  NA 118* 123* 123* 127* 126*  K 3.9 3.8 4.0 3.8 3.5  CL 90* 95* 95* 96* 98  CO2 21* 20* 19* 18* 20*  GLUCOSE 109* 103* 102* 102* 105*  BUN 20 15 13 14 10   CREATININE 0.73 0.61 0.56 0.50 0.48  CALCIUM 8.4* 8.2* 8.4* 8.5* 8.3*  MG  --  1.9  --   --  1.8   Liver Function Tests: Recent Labs  Lab 01/17/23 2040  AST 25  ALT 23  ALKPHOS 63  BILITOT 0.6  PROT 7.2  ALBUMIN 2.8*   CBG: No results for input(s): "GLUCAP" in the last 168 hours.  Discharge time spent: greater than 30 minutes.  Signed: Tyrone Nine, MD [facility] 01/19/2023

## 2023-01-26 DIAGNOSIS — M19021 Primary osteoarthritis, right elbow: Secondary | ICD-10-CM | POA: Diagnosis not present

## 2023-01-26 DIAGNOSIS — A046 Enteritis due to Yersinia enterocolitica: Secondary | ICD-10-CM | POA: Diagnosis not present

## 2023-01-26 DIAGNOSIS — E871 Hypo-osmolality and hyponatremia: Secondary | ICD-10-CM | POA: Diagnosis not present

## 2023-01-26 DIAGNOSIS — I480 Paroxysmal atrial fibrillation: Secondary | ICD-10-CM | POA: Diagnosis not present

## 2023-01-26 DIAGNOSIS — S60511A Abrasion of right hand, initial encounter: Secondary | ICD-10-CM | POA: Diagnosis not present

## 2023-01-26 DIAGNOSIS — L03113 Cellulitis of right upper limb: Secondary | ICD-10-CM | POA: Diagnosis not present

## 2023-01-26 DIAGNOSIS — M069 Rheumatoid arthritis, unspecified: Secondary | ICD-10-CM | POA: Diagnosis not present

## 2023-01-26 DIAGNOSIS — C91A0 Mature B-cell leukemia Burkitt-type not having achieved remission: Secondary | ICD-10-CM | POA: Diagnosis not present

## 2023-01-26 DIAGNOSIS — D709 Neutropenia, unspecified: Secondary | ICD-10-CM | POA: Diagnosis not present

## 2023-01-30 DIAGNOSIS — I480 Paroxysmal atrial fibrillation: Secondary | ICD-10-CM | POA: Diagnosis not present

## 2023-01-30 DIAGNOSIS — I10 Essential (primary) hypertension: Secondary | ICD-10-CM | POA: Diagnosis not present

## 2023-01-30 DIAGNOSIS — C91Z0 Other lymphoid leukemia not having achieved remission: Secondary | ICD-10-CM | POA: Diagnosis not present

## 2023-01-30 DIAGNOSIS — L0390 Cellulitis, unspecified: Secondary | ICD-10-CM | POA: Diagnosis not present

## 2023-01-30 DIAGNOSIS — E871 Hypo-osmolality and hyponatremia: Secondary | ICD-10-CM | POA: Diagnosis not present

## 2023-01-30 DIAGNOSIS — J449 Chronic obstructive pulmonary disease, unspecified: Secondary | ICD-10-CM | POA: Diagnosis not present

## 2023-01-30 DIAGNOSIS — D649 Anemia, unspecified: Secondary | ICD-10-CM | POA: Diagnosis not present

## 2023-01-30 DIAGNOSIS — D709 Neutropenia, unspecified: Secondary | ICD-10-CM | POA: Diagnosis not present

## 2023-01-30 DIAGNOSIS — F17210 Nicotine dependence, cigarettes, uncomplicated: Secondary | ICD-10-CM | POA: Diagnosis not present

## 2023-01-30 NOTE — Progress Notes (Signed)
[facility] [address], [place] [address]    Clinic Day:  02/01/2023  Referring physician: Benita Stabile, MD  Patient Care Team: Benita Stabile, MD as PCP - General (Internal Medicine) Therese Sarah, RN as Oncology Nurse Navigator (Oncology) Lanelle Bal, DO as Consulting Physician (Gastroenterology)   ASSESSMENT & PLAN:   Assessment: 1.  Lymphocytic leukocytosis: -CBC on 10-13-2019 shows white count 13.5, with differential showing 2% neutrophils, 94% lymphocytes, 3% monocytes and 1% basophils. -Hemoglobin and platelet count were normal. 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 -She reported 5 to 10 pound weight loss in the last 6 months but denies any fevers or night sweats. -Reports decreased appetite and decreased energy levels for the last 5 to 6 weeks. -CT CAP on 11/06/2019 showed a borderline prominent right axillary lymph node, 1.4 cm right upper normal-sized left axillary and right subpectoral lymph nodes with no overtly pathologic adenopathy.  Mild chronic diffuse prominence of thyroid gland without discrete nodule. - T-cell receptor gene rearrangement showed clonal T-cell gamma population as well as clonal T-cell beta population detected.  This usually reflects presence of T-cell lymphocytic neoplasm. - Bone marrow biopsy on 08/11/2020 shows normocellular bone marrow for age, moderate involvement by interstitial T-cell infiltrates.  This favors large granular lymphocytic leukemia. 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  This can be detected in 30 to 40% cases of T-cell LGL leukemia.  Polytypic plasma cells about 10% were seen.  Chromosome analysis was 50, XX.   2.  Social/family history: -She lives at home with her nephew.  She did office work prior to retirement. -She is a current active smoker, 1 pack/day for 50+ years. -Brother had cancer, type unknown.   3.  Rheumatoid arthritis: -She has rheumatoid arthritis of the  extremities, predominantly in the upper extremities with rheumatoid nodules. -Uses Voltaren and aspirin cream.  Plan: 1.  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 - She had 1 episode of skin infection involving the right hand and elbow in the last 1 year.  She had 1 hospitalization for hyponatremia.  She reports having low-grade fevers of 99 to 100 degrees daily.  Denies any night sweats.  No significant weight loss. - Labs today: LDH normal.  White count 4.8 with ANC of 0.1.  Platelet count and hemoglobin normal. - We have discussed treatment options for LGL leukemia including methotrexate and prednisone.  She does not want steroids.  We also discussed other options including cyclophosphamide and cyclosporine.  She is not interested in them after discussing side effects. - We will continue to monitor her at this time.  She has an appointment to see [place] back in May with repeat blood work.   2.  Weight loss: - No significant weight loss since last visit.  3.  Rheumatoid arthritis: - She has rheumatoid arthritis involving upper and lower extremities. - She is taking skeletal muscle relaxant and pain medication.    Orders Placed This Encounter  Procedures   CBC with Differential    Standing Status:   Future    Standing Expiration Date:   02/01/2024   Comprehensive metabolic panel    Standing Status:   Future    Standing Expiration Date:   02/01/2024   Lactate dehydrogenase    Standing Status:   Future    Standing Expiration Date:   02/01/2024  IgG, IgA, IgM    Standing Status:   Future    Standing Expiration Date:   02/01/2024      Alben Deeds Teague,acting as a scribe for Doreatha Massed, MD.,have documented all relevant documentation on the behalf of Doreatha Massed, MD,as directed by  Doreatha Massed, MD while in the presence of Doreatha Massed, MD.   I, Doreatha Massed MD,  have reviewed the above documentation for accuracy and completeness, and I agree with the above.   Doreatha Massed, MD   12/5/20245:40 PM  CHIEF COMPLAINT:   Diagnosis: Large granular lymphocytic leukemia    Cancer Staging  No matching staging information was found for the patient.    Prior Therapy: Splenectomy in 1970's   Current Therapy:  surveillance   HISTORY OF PRESENT ILLNESS:   Oncology History   No history exists.     INTERVAL HISTORY:   Sheri Pope is a 82 y.o. female presenting to clinic today for follow up of lymphocytosis and neutropenia. She was last seen by me on 12/27/21. She was seen by Durenda Hurt, NP on 01/05/23.  Since her last visit, she was admitted to the hospital from 01/17/23 to 01/19/23 for acute hyponatremia, treated with gentle isotonic saline. She was diagnosed with a new onset of atrial fibrillation while hospitalized and declined anticoagulation.   Her appetite level is at 50%. Her energy level is at 0%. She is accompanied by a family member.   She c/o lower back pain. She reports she has had multiple falls recently. She had 2 infections in the last year on the nodules in her hands and on the right elbow, and has been on multiple antibiotics. She had a CVA in January 2024. She is able to do most day-to-day activities. She denies any right sweats or unexpected weight loss. She would like to know treatment options other than methotrexate.   PAST MEDICAL HISTORY:   Past Medical History: Past Medical History:  Diagnosis Date   Arthritis    RA AND OA --PAIN AND SWELLIN IN LEFT KNEE   Blood in urine    NEGATIVE UROLOGY WORK UP --BUT ALWAYS HAS BLOOD IN URINE   GERD (gastroesophageal reflux disease)    H/O hiatal hernia    Hyperlipidemia    Leukemia (HCC)    PONV (postoperative nausea and vomiting)    ALWAYS SICK AFTER SURGERIES EXCEPT AFTER HIP REPLACMENT 2007   Vitamin D deficiency     Surgical History: Past Surgical History:   Procedure Laterality Date   ABDOMINAL HYSTERECTOMY  1870'S   BREAST SURGERY     BREAST BIOSPIES   CATARACT EXTRACTION W/PHACO Left 05/30/2012   Procedure: CATARACT EXTRACTION PHACO AND INTRAOCULAR LENS PLACEMENT (IOC);  Surgeon: Gemma Payor, MD;  Location: [facility];  Service: Ophthalmology;  Laterality: Left;  CDE: 16.99   CATARACT EXTRACTION W/PHACO Right 06/24/2012   Procedure: CATARACT EXTRACTION PHACO AND INTRAOCULAR LENS PLACEMENT (IOC);  Surgeon: Gemma Payor, MD;  Location: [facility];  Service: Ophthalmology;  Laterality: Right;  CDE:17.92   COLONOSCOPY WITH PROPOFOL N/A 10/14/2020   Procedure: COLONOSCOPY WITH PROPOFOL;  Surgeon: Corbin Ade, MD;  Location: [facility];  Service: Endoscopy;  Laterality: N/A;  ASA III / 12:30   ESOPHAGOGASTRODUODENOSCOPY (EGD) WITH PROPOFOL N/A 10/14/2020   Procedure: ESOPHAGOGASTRODUODENOSCOPY (EGD) WITH PROPOFOL;  Surgeon: Corbin Ade, MD;  Location: [facility];  Service: Endoscopy;  Laterality: N/A;   HIP SURGERY     JOINT REPLACEMENT  2007   RIGHT HIP REPLACEMENT  LEFT KNEE ARTHROSCOPY  AUG 2012   MALONEY DILATION  10/14/2020   Procedure: MALONEY DILATION;  Surgeon: Corbin Ade, MD;  Location: [facility];  Service: Endoscopy;;   MULTIPLE ORTHOPEDIC SURGERIES     ON BOTH HANDS AND BOTH FEET, RIGHT ELBOW   SPLEENECTOMY  IN THE 70'S   FOR LARGE CYST   TOTAL KNEE ARTHROPLASTY  11/10/2011   Procedure: TOTAL KNEE ARTHROPLASTY;  Surgeon: Kathryne Hitch, MD;  Location: [facility];  Service: Orthopedics;  Laterality: Left;  Left Total Knee Arthroplasty   YAG LASER APPLICATION Bilateral    Dr. Alto Denver    Social History: Social History   Socioeconomic History   Marital status: Widowed    Spouse name: Not on file   Number of children: Not on file   Years of education: Not on file   Highest education level: Not on file  Occupational History   Occupation: retired  Tobacco Use   Smoking status: Every Day    Current packs/day: 1.00     Average packs/day: 1 pack/day for 50.0 years (50.0 ttl pk-yrs)    Types: Cigarettes   Smokeless tobacco: Never  Vaping Use   Vaping status: Never Used  Substance and Sexual Activity   Alcohol use: No   Drug use: No   Sexual activity: Not Currently    Birth control/protection: Surgical  Other Topics Concern   Not on file  Social History Narrative   Not on file   Social Determinants of Health   Financial Resource Strain: Not on file  Food Insecurity: No Food Insecurity (01/18/2023)   Hunger Vital Sign    Worried About Running Out of Food in the Last Year: Never true    Ran Out of Food in the Last Year: Never true  Transportation Needs: No Transportation Needs (01/18/2023)   PRAPARE - Administrator, Civil Service (Medical): No    Lack of Transportation (Non-Medical): No  Physical Activity: Not on file  Stress: Not on file  Social Connections: Not on file  Intimate Partner Violence: Not At Risk (01/18/2023)   Humiliation, Afraid, Rape, and Kick questionnaire    Fear of Current or Ex-Partner: No    Emotionally Abused: No    Physically Abused: No    Sexually Abused: No    Family History: Family History  Problem Relation Age of Onset   Diabetes Sister    Macular degeneration Sister    Heart attack Mother    Stroke Father    Dementia Father    Cancer Brother    Healthy Sister    Healthy Sister    Diabetes Brother     Current Medications:  Current Outpatient Medications:    acetaminophen (TYLENOL) 325 MG tablet, Take 2 tablets (650 mg total) by mouth every 6 (six) hours as needed for mild pain (or Fever >/= 101)., Disp: , Rfl:    ALPRAZolam (XANAX) 0.25 MG tablet, Take 1 tablet (0.25 mg total) by mouth at bedtime., Disp: 30 tablet, Rfl: 0   aluminum-magnesium hydroxide 200-200 MG/5ML suspension, Take 10 mLs by mouth every 6 (six) hours as needed for indigestion., Disp: , Rfl:    aspirin 81 MG chewable tablet, Chew 1 tablet (81 mg total) by mouth daily.,  Disp: , Rfl:    HYDROcodone-acetaminophen (NORCO/VICODIN) 5-325 MG tablet, Take 0.5 tablets by mouth 2 (two) times daily as needed for moderate pain., Disp: 30 tablet, Rfl: 0   losartan (COZAAR) 25 MG tablet, Take 25 mg  by mouth daily., Disp: , Rfl:    methocarbamol (ROBAXIN) 500 MG tablet, Take 1 tablet (500 mg total) by mouth 2 (two) times daily., Disp: 60 tablet, Rfl: 0   metoprolol tartrate (LOPRESSOR) 25 MG tablet, Take 0.5 tablets (12.5 mg total) by mouth 2 (two) times daily., Disp: 30 tablet, Rfl: 0   ondansetron (ZOFRAN-ODT) 4 MG disintegrating tablet, Take 1 tablet (4 mg total) by mouth every 8 (eight) hours as needed for nausea or vomiting., Disp: 90 tablet, Rfl: 0   sodium chloride 1 g tablet, Take 1 tablet (1 g total) by mouth 3 (three) times daily with meals., Disp: 90 tablet, Rfl: 0   Allergies: Allergies  Allergen Reactions   Codeine Nausea And Vomiting   Prednisone Other (See Comments)    Thought she was having a heart attack   Sulfa Antibiotics Other (See Comments)    unknown   Aleve [Naproxen] Anxiety    Jittery and hot flashes   Latex Rash    REVIEW OF SYSTEMS:   Review of Systems  Constitutional:  Negative for chills, fatigue and fever.  HENT:   Negative for lump/mass, mouth sores, nosebleeds, sore throat and trouble swallowing.   Eyes:  Negative for eye problems.  Respiratory:  Positive for cough and shortness of breath.   Cardiovascular:  Positive for chest pain. Negative for leg swelling and palpitations.  Gastrointestinal:  Positive for nausea. Negative for abdominal pain, constipation, diarrhea and vomiting.  Genitourinary:  Negative for bladder incontinence, difficulty urinating, dysuria, frequency, hematuria and nocturia.   Musculoskeletal:  Positive for arthralgias (5/10 severity, throughout the body) and back pain (lower). Negative for flank pain, myalgias and neck pain.  Skin:  Negative for itching and rash.  Neurological:  Negative for dizziness,  headaches and numbness.  Hematological:  Does not bruise/bleed easily.  Psychiatric/Behavioral:  Positive for depression. Negative for sleep disturbance and suicidal ideas. The patient is nervous/anxious.   All other systems reviewed and are negative.    VITALS:   Blood pressure (!) 117/58, pulse 73, temperature 98.2 F (36.8 C), temperature source Oral, resp. rate 18, height 5\' 1"  (1.549 m), weight 94 lb 12.8 oz (43 kg), SpO2 97%.  Wt Readings from Last 3 Encounters:  02/01/23 94 lb 12.8 oz (43 kg)  01/17/23 96 lb (43.5 kg)  01/05/23 96 lb 12.5 oz (43.9 kg)    Body mass index is 17.91 kg/m.  Performance status (ECOG): 1 - Symptomatic but completely ambulatory  PHYSICAL EXAM:   Physical Exam Vitals and nursing note reviewed. Exam conducted with a chaperone present.  Constitutional:      Appearance: Normal appearance.  Cardiovascular:     Rate and Rhythm: Normal rate and regular rhythm.     Pulses: Normal pulses.     Heart sounds: Normal heart sounds.  Pulmonary:     Effort: Pulmonary effort is normal.     Breath sounds: Normal breath sounds.  Abdominal:     Palpations: Abdomen is soft. There is no hepatomegaly, splenomegaly or mass.     Tenderness: There is no abdominal tenderness.  Musculoskeletal:     Right lower leg: No edema.     Left lower leg: No edema.  Lymphadenopathy:     Cervical: No cervical adenopathy.     Right cervical: No superficial, deep or posterior cervical adenopathy.    Left cervical: No superficial, deep or posterior cervical adenopathy.     Upper Body:     Right upper body: No supraclavicular or  axillary adenopathy.     Left upper body: No supraclavicular or axillary adenopathy.  Neurological:     General: No focal deficit present.     Mental Status: She is alert and oriented to person, place, and time.  Psychiatric:        Mood and Affect: Mood normal.        Behavior: Behavior normal.     LABS:      Latest Ref Rng & Units 02/01/2023     9:48 AM 01/19/2023    5:16 AM 01/18/2023    3:08 AM  CBC  WBC 4.0 - 10.5 K/uL 4.8  4.1  3.4   Hemoglobin 12.0 - 15.0 g/dL 96.2  95.2  84.1   Hematocrit 36.0 - 46.0 % 38.2  33.6  31.7   Platelets 150 - 400 K/uL 371  326  299       Latest Ref Rng & Units 02/01/2023    9:48 AM 01/19/2023    5:16 AM 01/18/2023    5:36 PM  CMP  Glucose 70 - 99 mg/dL 324  401  027   BUN 8 - 23 mg/dL 12  10  14    Creatinine 0.44 - 1.00 mg/dL 2.53  6.64  4.03   Sodium 135 - 145 mmol/L 130  126  127   Potassium 3.5 - 5.1 mmol/L 4.5  3.5  3.8   Chloride 98 - 111 mmol/L 98  98  96   CO2 22 - 32 mmol/L 24  20  18    Calcium 8.9 - 10.3 mg/dL 9.2  8.3  8.5   Total Protein 6.5 - 8.1 g/dL 8.1     Total Bilirubin <1.2 mg/dL 0.5     Alkaline Phos 38 - 126 U/L 82     AST 15 - 41 U/L 20     ALT 0 - 44 U/L 24        No results found for: "CEA1", "CEA" / No results found for: "CEA1", "CEA" No results found for: "PSA1" No results found for: "KVQ259" No results found for: "CAN125"  No results found for: "TOTALPROTELP", "ALBUMINELP", "A1GS", "A2GS", "BETS", "BETA2SER", "GAMS", "MSPIKE", "SPEI" No results found for: "TIBC", "FERRITIN", "IRONPCTSAT" Lab Results  Component Value Date   LDH 133 02/01/2023   LDH 128 12/29/2022   LDH 133 06/29/2022     STUDIES:   ECHOCARDIOGRAM COMPLETE  Result Date: 01/19/2023    ECHOCARDIOGRAM REPORT   Patient Name:   Sheri Pope Date of Exam: 01/19/2023 Medical Rec #:  [id number]        Height:       61.0 in Accession #:    [id number]       Weight:       96.0 lb Date of Birth:  1940/07/26         BSA:          1.383 m Patient Age:    82 years         BP:           144/59 mmHg Patient Gender: F                HR:           81 bpm. Exam Location:  Inpatient Procedure: 2D Echo, Color Doppler and Cardiac Doppler Indications:    Atrial fibrillation  History:        Patient has prior history of Echocardiogram examinations, most  recent 03/14/2022. Risk Factors:Current  Smoker, Hypertension and                 Dyslipidemia.  Sonographer:    Delcie Roch RDCS Referring Phys: [id number] [address] IMPRESSIONS  1. Left ventricular ejection fraction, by estimation, is 50 to 55%. The left ventricle has low normal function. The left ventricle has no regional wall motion abnormalities. Left ventricular diastolic parameters were normal.  2. Right ventricular systolic function is normal. The right ventricular size is normal. There is normal pulmonary artery systolic pressure.  3. The mitral valve is normal in structure. Mild mitral valve regurgitation. No evidence of mitral stenosis.  4. The aortic valve is tricuspid. Aortic valve regurgitation is moderate. No aortic stenosis is present.  5. The inferior vena cava is dilated in size with >50% respiratory variability, suggesting right atrial pressure of 8 mmHg. Comparison(s): No significant change from prior study. FINDINGS  Left Ventricle: Left ventricular ejection fraction, by estimation, is 50 to 55%. The left ventricle has low normal function. The left ventricle has no regional wall motion abnormalities. The left ventricular internal cavity size was normal in size. There is no left ventricular hypertrophy. Left ventricular diastolic parameters were normal. Right Ventricle: The right ventricular size is normal. No increase in right ventricular wall thickness. Right ventricular systolic function is normal. There is normal pulmonary artery systolic pressure. The tricuspid regurgitant velocity is 2.33 m/s, and  with an assumed right atrial pressure of 8 mmHg, the estimated right ventricular systolic pressure is 29.7 mmHg. Left Atrium: Left atrial size was normal in size. Right Atrium: Right atrial size was normal in size. Pericardium: There is no evidence of pericardial effusion. Mitral Valve: The mitral valve is normal in structure. Mild mitral valve regurgitation. No evidence of mitral valve stenosis. Tricuspid Valve: The tricuspid valve  is normal in structure. Tricuspid valve regurgitation is mild . No evidence of tricuspid stenosis. Aortic Valve: The aortic valve is tricuspid. Aortic valve regurgitation is moderate. No aortic stenosis is present. Pulmonic Valve: The pulmonic valve was not well visualized. Pulmonic valve regurgitation is not visualized. No evidence of pulmonic stenosis. Aorta: The aortic root is normal in size and structure. Venous: The inferior vena cava is dilated in size with greater than 50% respiratory variability, suggesting right atrial pressure of 8 mmHg. IAS/Shunts: No atrial level shunt detected by color flow Doppler.  LEFT VENTRICLE PLAX 2D LVIDd:         4.30 cm     Diastology LVIDs:         3.10 cm     LV e' medial:    8.38 cm/s LV PW:         1.00 cm     LV E/e' medial:  8.6 LV IVS:        0.90 cm     LV e' lateral:   8.81 cm/s LVOT diam:     1.70 cm     LV E/e' lateral: 8.2 LV SV:         43 LV SV Index:   31 LVOT Area:     2.27 cm  LV Volumes (MOD) LV vol d, MOD A4C: 40.8 ml LV vol s, MOD A4C: 18.5 ml LV SV MOD A4C:     40.8 ml RIGHT VENTRICLE             IVC RV Basal diam:  2.10 cm     IVC diam: 2.60 cm RV S prime:  12.20 cm/s TAPSE (M-mode): 2.3 cm LEFT ATRIUM             Index        RIGHT ATRIUM           Index LA diam:        3.40 cm 2.46 cm/m   RA Area:     10.10 cm LA Vol (A2C):   33.4 ml 24.16 ml/m  RA Volume:   19.90 ml  14.39 ml/m LA Vol (A4C):   33.5 ml 24.23 ml/m LA Biplane Vol: 35.3 ml 25.53 ml/m  AORTIC VALVE LVOT Vmax:   84.40 cm/s LVOT Vmean:  56.100 cm/s LVOT VTI:    0.189 m  AORTA Ao Root diam: 2.40 cm MITRAL VALVE               TRICUSPID VALVE MV Area (PHT): 3.65 cm    TR Peak grad:   21.7 mmHg MV Decel Time: 208 msec    TR Vmax:        233.00 cm/s MV E velocity: 72.40 cm/s MV A velocity: 57.80 cm/s  SHUNTS MV E/A ratio:  1.25        Systemic VTI:  0.19 m                            Systemic Diam: 1.70 cm Vishnu Priya Mallipeddi Electronically signed by Winfield Rast Mallipeddi Signature  Date/Time: 01/19/2023/4:26:21 PM    Final    DG Chest 2 View  Result Date: 01/17/2023 CLINICAL DATA:  Fever.  Pain.  Fall.  History of COPD. EXAM: CHEST - 2 VIEW COMPARISON:  Chest radiographs 01/06/2023 in 10/21/2018 FINDINGS: Cardiac silhouette and mediastinal contours are within normal limits. Mild-to-moderate atherosclerotic calcification within the aortic arch. Flattening of the diaphragms and moderate hyperinflation, unchanged. Mild bilateral lower lung interstitial thickening is similar to prior. Increased lucencies within the bilateral upper lungs again compatible with chronic emphysematous change. No pleural effusion or thorax. Mild anterior height loss of a mid thoracic vertebral bodies unchanged from 10/21/2018 and chronic. Moderate multilevel anterior disc space narrowing greatest within the midthoracic spine. IMPRESSION: 1. No acute cardiopulmonary process. 2. Chronic emphysematous change. Electronically Signed   By: Neita Garnet M.D.   On: 01/17/2023 16:41   DG ELBOW COMPLETE RIGHT (3+VIEW)  Result Date: 01/17/2023 CLINICAL DATA:  Fall on 182024. Right elbow pain. Hand and right elbow from fall. EXAM: RIGHT ELBOW - COMPLETE 3+ VIEW COMPARISON:  None Available. FINDINGS: There is diffuse decreased bone mineralization. No definitive elbow joint effusion is seen. Moderate radiocapitellar joint space narrowing. Mild trochlea-olecranon joint space narrowing and peripheral medial spurring. Minimal chronic enthesopathic change at the triceps insertion on the olecranon. No definite acute fracture is seen. No dislocation. IMPRESSION: 1. No definite acute fracture is seen. 2. 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 Electronically Signed   By: Neita Garnet M.D.   On: 01/17/2023 16:39   DG Chest 2 View  Result Date: 01/06/2023 CLINICAL DATA:  SOB chronic cough EXAM: CHEST - 2 VIEW COMPARISON:  10/21/2018. FINDINGS: The heart size and mediastinal contours are within  normal limits. Lungs are hyperinflated suggesting COPD. There is no focal consolidation. No pneumothorax or pleural effusion. Aorta is calcified. There are thoracic degenerative changes IMPRESSION: Findings suggest COPD.  Otherwise no acute cardiopulmonary disease. Electronically Signed   By: Layla Maw M.D.   On: 01/06/2023 22:25

## 2023-01-31 ENCOUNTER — Other Ambulatory Visit: Payer: Self-pay

## 2023-01-31 DIAGNOSIS — C91Z0 Other lymphoid leukemia not having achieved remission: Secondary | ICD-10-CM

## 2023-02-01 ENCOUNTER — Inpatient Hospital Stay: Payer: Medicare HMO | Attending: Oncology | Admitting: Hematology

## 2023-02-01 ENCOUNTER — Other Ambulatory Visit: Payer: Self-pay | Admitting: *Deleted

## 2023-02-01 ENCOUNTER — Inpatient Hospital Stay: Payer: Medicare HMO | Admitting: Hematology

## 2023-02-01 VITALS — BP 117/58 | HR 73 | Temp 98.2°F | Resp 18 | Ht 61.0 in | Wt 94.8 lb

## 2023-02-01 DIAGNOSIS — M545 Low back pain, unspecified: Secondary | ICD-10-CM | POA: Insufficient documentation

## 2023-02-01 DIAGNOSIS — I700 Atherosclerosis of aorta: Secondary | ICD-10-CM | POA: Diagnosis not present

## 2023-02-01 DIAGNOSIS — R300 Dysuria: Secondary | ICD-10-CM

## 2023-02-01 DIAGNOSIS — C91Z0 Other lymphoid leukemia not having achieved remission: Secondary | ICD-10-CM

## 2023-02-01 DIAGNOSIS — Z79899 Other long term (current) drug therapy: Secondary | ICD-10-CM | POA: Insufficient documentation

## 2023-02-01 DIAGNOSIS — Z7982 Long term (current) use of aspirin: Secondary | ICD-10-CM | POA: Insufficient documentation

## 2023-02-01 DIAGNOSIS — M25521 Pain in right elbow: Secondary | ICD-10-CM | POA: Insufficient documentation

## 2023-02-01 DIAGNOSIS — Z809 Family history of malignant neoplasm, unspecified: Secondary | ICD-10-CM | POA: Insufficient documentation

## 2023-02-01 DIAGNOSIS — Z8673 Personal history of transient ischemic attack (TIA), and cerebral infarction without residual deficits: Secondary | ICD-10-CM | POA: Diagnosis not present

## 2023-02-01 DIAGNOSIS — M47814 Spondylosis without myelopathy or radiculopathy, thoracic region: Secondary | ICD-10-CM | POA: Diagnosis not present

## 2023-02-01 DIAGNOSIS — F17210 Nicotine dependence, cigarettes, uncomplicated: Secondary | ICD-10-CM | POA: Diagnosis not present

## 2023-02-01 DIAGNOSIS — M069 Rheumatoid arthritis, unspecified: Secondary | ICD-10-CM | POA: Insufficient documentation

## 2023-02-01 DIAGNOSIS — J449 Chronic obstructive pulmonary disease, unspecified: Secondary | ICD-10-CM | POA: Diagnosis not present

## 2023-02-01 DIAGNOSIS — I4891 Unspecified atrial fibrillation: Secondary | ICD-10-CM | POA: Diagnosis not present

## 2023-02-01 LAB — COMPREHENSIVE METABOLIC PANEL
ALT: 24 U/L (ref 0–44)
AST: 20 U/L (ref 15–41)
Albumin: 3.2 g/dL — ABNORMAL LOW (ref 3.5–5.0)
Alkaline Phosphatase: 82 U/L (ref 38–126)
Anion gap: 8 (ref 5–15)
BUN: 12 mg/dL (ref 8–23)
CO2: 24 mmol/L (ref 22–32)
Calcium: 9.2 mg/dL (ref 8.9–10.3)
Chloride: 98 mmol/L (ref 98–111)
Creatinine, Ser: 0.59 mg/dL (ref 0.44–1.00)
GFR, Estimated: >60 mL/min (ref >60)
Glucose, Bld: 105 mg/dL — ABNORMAL HIGH (ref 70–99)
Potassium: 4.5 mmol/L (ref 3.5–5.1)
Sodium: 130 mmol/L — ABNORMAL LOW (ref 135–145)
Total Bilirubin: 0.5 mg/dL (ref <1.2)
Total Protein: 8.1 g/dL (ref 6.5–8.1)

## 2023-02-01 LAB — URINALYSIS, ROUTINE W REFLEX MICROSCOPIC
Bilirubin Urine: NEGATIVE
Glucose, UA: NEGATIVE mg/dL
Hgb urine dipstick: NEGATIVE
Ketones, ur: NEGATIVE mg/dL
Leukocytes,Ua: NEGATIVE
Nitrite: NEGATIVE
Protein, ur: NEGATIVE mg/dL
Specific Gravity, Urine: 1.003 — ABNORMAL LOW (ref 1.005–1.030)
pH: 5 (ref 5.0–8.0)

## 2023-02-01 LAB — CBC WITH DIFFERENTIAL/PLATELET
Abs Immature Granulocytes: 0 10*3/uL (ref 0.00–0.07)
Basophils Absolute: 0 10*3/uL (ref 0.0–0.1)
Basophils Relative: 1 %
Eosinophils Absolute: 0 10*3/uL (ref 0.0–0.5)
Eosinophils Relative: 0 %
HCT: 38.2 % (ref 36.0–46.0)
Hemoglobin: 13 g/dL (ref 12.0–15.0)
Lymphocytes Relative: 94 %
Lymphs Abs: 4.5 10*3/uL — ABNORMAL HIGH (ref 0.7–4.0)
MCH: 34.4 pg — ABNORMAL HIGH (ref 26.0–34.0)
MCHC: 34 g/dL (ref 30.0–36.0)
MCV: 101.1 fL — ABNORMAL HIGH (ref 80.0–100.0)
Monocytes Absolute: 0.1 10*3/uL (ref 0.1–1.0)
Monocytes Relative: 2 %
Neutro Abs: 0.1 10*3/uL — CL (ref 1.7–7.7)
Neutrophils Relative %: 3 %
Platelets: 371 10*3/uL (ref 150–400)
RBC: 3.78 MIL/uL — ABNORMAL LOW (ref 3.87–5.11)
RDW: 18.7 % — ABNORMAL HIGH (ref 11.5–15.5)
WBC: 4.8 10*3/uL (ref 4.0–10.5)
nRBC: 0.4 % — ABNORMAL HIGH (ref 0.0–0.2)

## 2023-02-01 LAB — LACTATE DEHYDROGENASE: LDH: 133 U/L (ref 98–192)

## 2023-02-01 NOTE — Progress Notes (Signed)
 CRITICAL VALUE ALERT Critical value received:  ANC 0.1.  Date of notification:  02-01-2023 Time of notification: 10:45 am.  Critical value read back:  Yes.   Nurse who received alert:  B.Randal Yepiz RN.  MD notified time and response:  Dr. Ellin Saba @ 10:46 am.

## 2023-02-01 NOTE — Patient Instructions (Addendum)
[facility] at [facility] Discharge Instructions   You were seen and examined today by Dr. Ellin Saba.  He reviewed the results of your lab work which are normal/stable. The urinalysis results were also normal.   We will see you back in 4 months. We will repeat lab work prior to this visit.   Return as scheduled.    Thank you for choosing [facility] at [facility] to provide your oncology and hematology care.  To afford each patient quality time with our provider, please arrive at least 15 minutes before your scheduled appointment time.   If you have a lab appointment with the Cancer Center please come in thru the Main Entrance and check in at the main information desk.  You need to re-schedule your appointment should you arrive 10 or more minutes late.  We strive to give you quality time with our providers, and arriving late affects you and other patients whose appointments are after yours.  Also, if you no show three or more times for appointments you may be dismissed from the clinic at the providers discretion.     Again, thank you for choosing [facility].  Our hope is that these requests will decrease the amount of time that you wait before being seen by our physicians.       _____________________________________________________________  Should you have questions after your visit to [facility], please contact our office at [phone] and follow the prompts.  Our office hours are 8:00 a.m. and 4:30 p.m. Monday - Friday.  Please note that voicemails left after 4:00 p.m. may not be returned until the following business day.  We are closed weekends and major holidays.  You do have access to a nurse 24-7, just call the main number to the clinic [phone] and do not press any options, hold on the line and a nurse will answer the phone.    For prescription refill requests, have your pharmacy contact our office and  allow 72 hours.    Due to Covid, you will need to wear a mask upon entering the hospital. If you do not have a mask, a mask will be given to you at the Main Entrance upon arrival. For doctor visits, patients may have 1 support person age 60 or older with them. For treatment visits, patients can not have anyone with them due to social distancing guidelines and our immunocompromised population.

## 2023-02-02 LAB — URINE CULTURE: Culture: NO GROWTH

## 2023-04-05 ENCOUNTER — Encounter: Payer: Self-pay | Admitting: Internal Medicine

## 2023-06-01 DIAGNOSIS — L89153 Pressure ulcer of sacral region, stage 3: Secondary | ICD-10-CM | POA: Diagnosis not present

## 2023-06-01 DIAGNOSIS — L0291 Cutaneous abscess, unspecified: Secondary | ICD-10-CM | POA: Diagnosis not present

## 2023-06-01 DIAGNOSIS — N764 Abscess of vulva: Secondary | ICD-10-CM | POA: Diagnosis not present

## 2023-06-01 DIAGNOSIS — R300 Dysuria: Secondary | ICD-10-CM | POA: Diagnosis not present

## 2023-07-06 ENCOUNTER — Inpatient Hospital Stay: Payer: Medicare HMO

## 2023-07-11 ENCOUNTER — Ambulatory Visit: Payer: Medicare HMO | Admitting: Oncology

## 2023-07-13 ENCOUNTER — Ambulatory Visit: Payer: Medicare HMO | Admitting: Oncology

## 2023-07-19 ENCOUNTER — Inpatient Hospital Stay: Payer: Medicare HMO | Admitting: Oncology

## 2023-08-09 DIAGNOSIS — D649 Anemia, unspecified: Secondary | ICD-10-CM | POA: Diagnosis not present

## 2023-08-09 DIAGNOSIS — E871 Hypo-osmolality and hyponatremia: Secondary | ICD-10-CM | POA: Diagnosis not present

## 2023-08-09 DIAGNOSIS — E559 Vitamin D deficiency, unspecified: Secondary | ICD-10-CM | POA: Diagnosis not present

## 2023-08-09 DIAGNOSIS — E785 Hyperlipidemia, unspecified: Secondary | ICD-10-CM | POA: Diagnosis not present

## 2023-08-14 DIAGNOSIS — D709 Neutropenia, unspecified: Secondary | ICD-10-CM | POA: Diagnosis not present

## 2023-08-14 DIAGNOSIS — I63412 Cerebral infarction due to embolism of left middle cerebral artery: Secondary | ICD-10-CM | POA: Diagnosis not present

## 2023-08-14 DIAGNOSIS — I480 Paroxysmal atrial fibrillation: Secondary | ICD-10-CM | POA: Diagnosis not present

## 2023-08-14 DIAGNOSIS — J449 Chronic obstructive pulmonary disease, unspecified: Secondary | ICD-10-CM | POA: Diagnosis not present

## 2023-08-14 DIAGNOSIS — E785 Hyperlipidemia, unspecified: Secondary | ICD-10-CM | POA: Diagnosis not present

## 2023-08-14 DIAGNOSIS — F17210 Nicotine dependence, cigarettes, uncomplicated: Secondary | ICD-10-CM | POA: Diagnosis not present

## 2023-08-14 DIAGNOSIS — D649 Anemia, unspecified: Secondary | ICD-10-CM | POA: Diagnosis not present

## 2023-08-14 DIAGNOSIS — I69359 Hemiplegia and hemiparesis following cerebral infarction affecting unspecified side: Secondary | ICD-10-CM | POA: Diagnosis not present

## 2023-08-14 DIAGNOSIS — E871 Hypo-osmolality and hyponatremia: Secondary | ICD-10-CM | POA: Diagnosis not present

## 2023-08-14 DIAGNOSIS — I10 Essential (primary) hypertension: Secondary | ICD-10-CM | POA: Diagnosis not present

## 2023-08-14 DIAGNOSIS — C91Z0 Other lymphoid leukemia not having achieved remission: Secondary | ICD-10-CM | POA: Diagnosis not present

## 2023-08-14 DIAGNOSIS — R2689 Other abnormalities of gait and mobility: Secondary | ICD-10-CM | POA: Diagnosis not present

## 2023-09-25 ENCOUNTER — Emergency Department (HOSPITAL_COMMUNITY)
Admission: EM | Admit: 2023-09-25 | Discharge: 2023-09-25 | Disposition: A | Discharge status: Home / Self Care | Attending: Emergency Medicine | Admitting: Emergency Medicine

## 2023-09-25 ENCOUNTER — Emergency Department (HOSPITAL_COMMUNITY)

## 2023-09-25 ENCOUNTER — Other Ambulatory Visit: Payer: Self-pay

## 2023-09-25 ENCOUNTER — Encounter (HOSPITAL_COMMUNITY): Payer: Self-pay

## 2023-09-25 DIAGNOSIS — Z7982 Long term (current) use of aspirin: Secondary | ICD-10-CM | POA: Insufficient documentation

## 2023-09-25 DIAGNOSIS — Z91040 Latex allergy status: Secondary | ICD-10-CM | POA: Insufficient documentation

## 2023-09-25 DIAGNOSIS — J029 Acute pharyngitis, unspecified: Secondary | ICD-10-CM | POA: Diagnosis present

## 2023-09-25 DIAGNOSIS — J039 Acute tonsillitis, unspecified: Secondary | ICD-10-CM | POA: Insufficient documentation

## 2023-09-25 LAB — CBC WITH DIFFERENTIAL/PLATELET
Basophils Absolute: 0 K/uL (ref 0.0–0.1)
Basophils Relative: 0 %
Eosinophils Absolute: 0 K/uL (ref 0.0–0.5)
Eosinophils Relative: 0 %
HCT: 38.2 % (ref 36.0–46.0)
Hemoglobin: 13.6 g/dL (ref 12.0–15.0)
Lymphocytes Relative: 96 %
Lymphs Abs: 2.5 K/uL (ref 0.7–4.0)
MCH: 34.8 pg — ABNORMAL HIGH (ref 26.0–34.0)
MCHC: 35.6 g/dL (ref 30.0–36.0)
MCV: 97.7 fL (ref 80.0–100.0)
Monocytes Absolute: 0.1 K/uL (ref 0.1–1.0)
Monocytes Relative: 3 %
Neutro Abs: 0 K/uL — CL (ref 1.7–7.7)
Neutrophils Relative %: 1 %
Platelets: 223 K/uL (ref 150–400)
RBC: 3.91 MIL/uL (ref 3.87–5.11)
RDW: 18.3 % — ABNORMAL HIGH (ref 11.5–15.5)
Smear Review: NORMAL
WBC: 2.6 K/uL — ABNORMAL LOW (ref 4.0–10.5)
nRBC: 0.8 % — ABNORMAL HIGH (ref 0.0–0.2)

## 2023-09-25 LAB — BASIC METABOLIC PANEL WITH GFR
Anion gap: 10 (ref 5–15)
BUN: 16 mg/dL (ref 8–23)
CO2: 22 mmol/L (ref 22–32)
Calcium: 9.2 mg/dL (ref 8.9–10.3)
Chloride: 93 mmol/L — ABNORMAL LOW (ref 98–111)
Creatinine, Ser: 0.74 mg/dL (ref 0.44–1.00)
GFR, Estimated: >60 mL/min (ref >60)
Glucose, Bld: 104 mg/dL — ABNORMAL HIGH (ref 70–99)
Potassium: 4.4 mmol/L (ref 3.5–5.1)
Sodium: 125 mmol/L — ABNORMAL LOW (ref 135–145)

## 2023-09-25 LAB — GROUP A STREP BY PCR: Group A Strep by PCR: NOT DETECTED

## 2023-09-25 LAB — LACTIC ACID, PLASMA: Lactic Acid, Venous: 1.1 mmol/L (ref 0.5–1.9)

## 2023-09-25 MED ORDER — ACETAMINOPHEN 160 MG/5ML PO SOLN
650.0000 mg | Freq: Once | ORAL | Status: AC
  Administered 2023-09-25: 650 mg via ORAL
  Filled 2023-09-25: qty 20.3

## 2023-09-25 MED ORDER — SODIUM CHLORIDE 0.9 % IV BOLUS
1000.0000 mL | Freq: Once | INTRAVENOUS | Status: AC
  Administered 2023-09-25: 1000 mL via INTRAVENOUS

## 2023-09-25 MED ORDER — DEXAMETHASONE SODIUM PHOSPHATE 10 MG/ML IJ SOLN
10.0000 mg | Freq: Once | INTRAMUSCULAR | Status: AC
  Administered 2023-09-25: 10 mg via INTRAVENOUS
  Filled 2023-09-25: qty 1

## 2023-09-25 MED ORDER — IOHEXOL 300 MG/ML  SOLN
75.0000 mL | Freq: Once | INTRAMUSCULAR | Status: AC | PRN
  Administered 2023-09-25: 75 mL via INTRAVENOUS

## 2023-09-25 MED ORDER — CLINDAMYCIN PHOSPHATE 600 MG/50ML IV SOLN
600.0000 mg | Freq: Once | INTRAVENOUS | Status: AC
  Administered 2023-09-25: 600 mg via INTRAVENOUS
  Filled 2023-09-25: qty 50

## 2023-09-25 MED ORDER — CLINDAMYCIN HCL 150 MG PO CAPS: 150.0000 mg | ORAL_CAPSULE | Freq: Four times a day (QID) | ORAL | 0 refills | Status: AC

## 2023-09-25 MED ORDER — ONDANSETRON HCL 4 MG/2ML IJ SOLN
4.0000 mg | Freq: Once | INTRAMUSCULAR | Status: AC
  Administered 2023-09-25: 4 mg via INTRAVENOUS
  Filled 2023-09-25: qty 2

## 2023-09-25 NOTE — ED Triage Notes (Signed)
 Pt BIB family for throat swelling. Per family pt had a bump noted to rt side of her throat and when hospice nurse came out today stated they could not visualize her throat and to come to the ED. Pt was given 5 mg of children Benadryl . Pt talking during triage.

## 2023-09-25 NOTE — Discharge Instructions (Addendum)
 You were seen in the emergency department for throat pain and facial swelling.  Your CAT scan showed a tonsil infection with some small fluid pockets.  The ear nose throat doctor did not feel these needed to be drained emergently and recommended treatment with steroids and antibiotics.  Prescription for antibiotics has been sent to the pharmacy.  You can also do warm salt water  gargles to help with the discomfort.  Keep well-hydrated.  Return if any worsening or concerning symptoms

## 2023-09-25 NOTE — ED Provider Notes (Signed)
 [place] EMERGENCY DEPARTMENT AT [facility] Provider Note   CSN: [id number] Arrival date & time: 09/25/23  1609     Patient presents with: Oral Swelling   Sheri Pope is a 83 y.o. female.  She has a history of lymphocytic leukocytosis, rheumatoid arthritis.  She is currently on hospice.  Was sent in by her hospice doctor today after having new onset of facial neck and throat swelling.  Symptoms started last evening.  Unable to eat.  Painful to swallow liquids.  No vomiting or diarrhea no fever.  Never had this before.  {Add pertinent medical, surgical, social history, OB history to YEP:67052} The history is provided by the patient, a relative and a caregiver.  Sore Throat This is a new problem. The current episode started yesterday. The problem occurs constantly. The problem has been gradually worsening. Pertinent negatives include no chest pain, no abdominal pain, no headaches and no shortness of breath. The symptoms are aggravated by swallowing. Nothing relieves the symptoms. She has tried nothing for the symptoms. The treatment provided no relief.       Prior to Admission medications   Medication Sig Start Date End Date Taking? Authorizing Provider  acetaminophen  (TYLENOL ) 325 MG tablet Take 2 tablets (650 mg total) by mouth every 6 (six) hours as needed for mild pain (or Fever >/= 101). 03/28/22   Angiulli, Toribio PARAS, PA-C  ALPRAZolam  (XANAX ) 0.25 MG tablet Take 1 tablet (0.25 mg total) by mouth at bedtime. 03/28/22   Angiulli, Toribio PARAS, PA-C  aluminum-magnesium  hydroxide 200-200 MG/5ML suspension Take 10 mLs by mouth every 6 (six) hours as needed for indigestion.    [provider]  aspirin  81 MG chewable tablet Chew 1 tablet (81 mg total) by mouth daily. 03/21/22   Johnson, Clanford L, MD  HYDROcodone -acetaminophen  (NORCO/VICODIN) 5-325 MG tablet Take 0.5 tablets by mouth 2 (two) times daily as needed for moderate pain. 03/28/22   Angiulli, Toribio PARAS, PA-C   losartan  (COZAAR ) 25 MG tablet Take 25 mg by mouth daily. 04/04/22   [provider]  methocarbamol  (ROBAXIN ) 500 MG tablet Take 1 tablet (500 mg total) by mouth 2 (two) times daily. 03/28/22   Angiulli, Toribio PARAS, PA-C  metoprolol  tartrate (LOPRESSOR ) 25 MG tablet Take 0.5 tablets (12.5 mg total) by mouth 2 (two) times daily. 01/19/23   Bryn Bernardino NOVAK, MD  ondansetron  (ZOFRAN -ODT) 4 MG disintegrating tablet Take 1 tablet (4 mg total) by mouth every 8 (eight) hours as needed for nausea or vomiting. 01/05/23   Geofm Delon BRAVO, NP  sodium chloride  1 g tablet Take 1 tablet (1 g total) by mouth 3 (three) times daily with meals. 01/19/23   Bryn Bernardino NOVAK, MD    Allergies: Codeine, Prednisone , Sulfa antibiotics, Aleve [naproxen], and Latex    Review of Systems  Constitutional:  Negative for fever.  HENT:  Positive for facial swelling, sore throat, trouble swallowing and voice change.   Eyes:  Negative for visual disturbance.  Respiratory:  Negative for shortness of breath.   Cardiovascular:  Negative for chest pain.  Gastrointestinal:  Negative for abdominal pain.  Musculoskeletal:  Positive for neck pain.  Neurological:  Negative for headaches.    Updated Vital Signs Temp (!) 100.8 F (38.2 C) (Oral)   Ht 5' 1 (1.549 m)   Wt 43 kg   BMI 17.91 kg/m   Physical Exam Vitals and nursing note reviewed.  Constitutional:      General: She is not in acute  distress.    Appearance: Normal appearance. She is well-developed.  HENT:     Head: Normocephalic and atraumatic.     Comments: She has significant erythema in her oropharynx and her right tonsillar pillar is bulging.  She has significant anterior cervical lymphadenopathy.    Mouth/Throat:     Pharynx: Posterior oropharyngeal erythema present.  Eyes:     Conjunctiva/sclera: Conjunctivae normal.  Cardiovascular:     Rate and Rhythm: Normal rate and regular rhythm.     Heart sounds: No murmur heard. Pulmonary:     Effort:  Pulmonary effort is normal. No respiratory distress.     Breath sounds: Normal breath sounds. No stridor. No wheezing.  Abdominal:     Palpations: Abdomen is soft.     Tenderness: There is no abdominal tenderness. There is no guarding or rebound.  Musculoskeletal:        General: No tenderness or deformity. Normal range of motion.     Cervical back: Neck supple.  Skin:    General: Skin is warm and dry.  Neurological:     General: No focal deficit present.     Mental Status: She is alert.     GCS: GCS eye subscore is 4. GCS verbal subscore is 5. GCS motor subscore is 6.     (all labs ordered are listed, but only abnormal results are displayed) Labs Reviewed  CULTURE, BLOOD (ROUTINE X 2)  CULTURE, BLOOD (ROUTINE X 2)  GROUP A STREP BY PCR  BASIC METABOLIC PANEL WITH GFR  CBC WITH DIFFERENTIAL/PLATELET  LACTIC ACID, PLASMA  LACTIC ACID, PLASMA    EKG: None  Radiology: No results found.  {Document cardiac monitor, telemetry assessment procedure when appropriate:32947} Procedures   Medications Ordered in the ED  sodium chloride  0.9 % bolus 1,000 mL (has no administration in time range)  dexamethasone  (DECADRON ) injection 10 mg (has no administration in time range)  clindamycin  (CLEOCIN ) IVPB 600 mg (has no administration in time range)      {Click here for ABCD2, HEART and other calculators REFRESH Note before signing:1}                              Medical Decision Making Amount and/or Complexity of Data Reviewed Labs: ordered.  Risk Prescription drug management.   This patient complains of ***; this involves an extensive number of treatment Options and is a complaint that carries with it a high risk of complications and morbidity. The differential includes ***  I ordered, reviewed and interpreted labs, which included *** I ordered medication *** and reviewed PMP when indicated. I ordered imaging studies which included *** and I independently    visualized and  interpreted imaging which showed *** Additional history obtained from *** Previous records obtained and reviewed *** I consulted *** and discussed lab and imaging findings and discussed disposition.  Cardiac monitoring reviewed, *** Social determinants considered, *** Critical Interventions: ***  After the interventions stated above, I reevaluated the patient and found *** Admission and further testing considered, ***   {Document critical care time when appropriate  Document review of labs and clinical decision tools ie CHADS2VASC2, etc  Document your independent review of radiology images and any outside records  Document your discussion with family members, caretakers and with consultants  Document social determinants of health affecting pt's care  Document your decision making why or why not admission, treatments were needed:32947:::1}   Final diagnoses:  None  ED Discharge Orders     None

## 2023-09-30 LAB — CULTURE, BLOOD (ROUTINE X 2)
Culture: NO GROWTH
Culture: NO GROWTH

## 2023-10-15 DIAGNOSIS — L720 Epidermal cyst: Secondary | ICD-10-CM | POA: Diagnosis not present

## 2023-12-29 DEATH — deceased
